# Patient Record
Sex: Female | Born: 1950
Health system: Southern US, Community
[De-identification: ages and names within clinical notes are randomized; demographics above are authoritative.]

## PROBLEM LIST (undated history)

## (undated) DIAGNOSIS — M1990 Unspecified osteoarthritis, unspecified site: Secondary | ICD-10-CM

## (undated) DIAGNOSIS — L659 Nonscarring hair loss, unspecified: Secondary | ICD-10-CM

## (undated) DIAGNOSIS — I341 Nonrheumatic mitral (valve) prolapse: Secondary | ICD-10-CM

## (undated) DIAGNOSIS — R112 Nausea with vomiting, unspecified: Secondary | ICD-10-CM

## (undated) DIAGNOSIS — R569 Unspecified convulsions: Secondary | ICD-10-CM

## (undated) DIAGNOSIS — G7000 Myasthenia gravis without (acute) exacerbation: Secondary | ICD-10-CM

## (undated) DIAGNOSIS — Z9889 Other specified postprocedural states: Secondary | ICD-10-CM

## (undated) DIAGNOSIS — T8859XA Other complications of anesthesia, initial encounter: Secondary | ICD-10-CM

## (undated) DIAGNOSIS — T4145XA Adverse effect of unspecified anesthetic, initial encounter: Secondary | ICD-10-CM

## (undated) DIAGNOSIS — E785 Hyperlipidemia, unspecified: Secondary | ICD-10-CM

## (undated) DIAGNOSIS — K219 Gastro-esophageal reflux disease without esophagitis: Secondary | ICD-10-CM

## (undated) DIAGNOSIS — R609 Edema, unspecified: Secondary | ICD-10-CM

## (undated) DIAGNOSIS — N189 Chronic kidney disease, unspecified: Secondary | ICD-10-CM

## (undated) DIAGNOSIS — R51 Headache: Secondary | ICD-10-CM

## (undated) DIAGNOSIS — D649 Anemia, unspecified: Secondary | ICD-10-CM

## (undated) HISTORY — DX: Myasthenia gravis without (acute) exacerbation: G70.00

## (undated) HISTORY — DX: Gastro-esophageal reflux disease without esophagitis: K21.9

## (undated) HISTORY — PX: OTHER SURGICAL HISTORY: SHX169

## (undated) HISTORY — DX: Hyperlipidemia, unspecified: E78.5

---

## 1984-05-13 HISTORY — PX: TUBAL LIGATION: SHX77

## 2002-11-01 ENCOUNTER — Ambulatory Visit (HOSPITAL_COMMUNITY): Admission: RE | Admit: 2002-11-01 | Discharge: 2002-11-01 | Payer: Self-pay | Admitting: Internal Medicine

## 2002-11-01 ENCOUNTER — Encounter: Payer: Self-pay | Admitting: Internal Medicine

## 2002-11-10 ENCOUNTER — Ambulatory Visit (HOSPITAL_COMMUNITY): Admission: RE | Admit: 2002-11-10 | Discharge: 2002-11-10 | Payer: Self-pay | Admitting: Internal Medicine

## 2002-11-10 ENCOUNTER — Encounter: Payer: Self-pay | Admitting: Internal Medicine

## 2002-11-18 ENCOUNTER — Ambulatory Visit (HOSPITAL_COMMUNITY): Admission: RE | Admit: 2002-11-18 | Discharge: 2002-11-18 | Payer: Self-pay | Admitting: Internal Medicine

## 2002-11-18 ENCOUNTER — Encounter: Payer: Self-pay | Admitting: Internal Medicine

## 2003-06-01 ENCOUNTER — Ambulatory Visit (HOSPITAL_COMMUNITY): Admission: RE | Admit: 2003-06-01 | Discharge: 2003-06-01 | Payer: Self-pay | Admitting: Internal Medicine

## 2003-09-21 ENCOUNTER — Ambulatory Visit (HOSPITAL_COMMUNITY): Admission: RE | Admit: 2003-09-21 | Discharge: 2003-09-21 | Payer: Self-pay | Admitting: Internal Medicine

## 2003-11-16 ENCOUNTER — Ambulatory Visit (HOSPITAL_COMMUNITY): Admission: RE | Admit: 2003-11-16 | Discharge: 2003-11-16 | Payer: Self-pay | Admitting: Internal Medicine

## 2003-11-18 ENCOUNTER — Ambulatory Visit (HOSPITAL_COMMUNITY): Admission: RE | Admit: 2003-11-18 | Discharge: 2003-11-18 | Payer: Self-pay | Admitting: Internal Medicine

## 2003-12-28 HISTORY — PX: OTHER SURGICAL HISTORY: SHX169

## 2004-02-07 ENCOUNTER — Ambulatory Visit (HOSPITAL_COMMUNITY): Admission: RE | Admit: 2004-02-07 | Discharge: 2004-02-07 | Payer: Self-pay | Admitting: General Surgery

## 2004-05-01 ENCOUNTER — Ambulatory Visit (HOSPITAL_COMMUNITY): Admission: RE | Admit: 2004-05-01 | Discharge: 2004-05-01 | Payer: Self-pay | Admitting: Internal Medicine

## 2004-05-13 HISTORY — PX: CERVICAL FUSION: SHX112

## 2004-07-31 ENCOUNTER — Ambulatory Visit (HOSPITAL_COMMUNITY): Admission: RE | Admit: 2004-07-31 | Discharge: 2004-07-31 | Payer: Self-pay | Admitting: Internal Medicine

## 2004-09-18 ENCOUNTER — Ambulatory Visit (HOSPITAL_COMMUNITY): Admission: RE | Admit: 2004-09-18 | Discharge: 2004-09-19 | Payer: Self-pay | Admitting: Neurosurgery

## 2004-12-11 ENCOUNTER — Ambulatory Visit (HOSPITAL_COMMUNITY): Admission: RE | Admit: 2004-12-11 | Discharge: 2004-12-11 | Payer: Self-pay | Admitting: Internal Medicine

## 2005-07-16 ENCOUNTER — Ambulatory Visit (HOSPITAL_COMMUNITY): Admission: RE | Admit: 2005-07-16 | Discharge: 2005-07-16 | Payer: Self-pay | Admitting: Pulmonary Disease

## 2005-09-09 ENCOUNTER — Ambulatory Visit: Payer: Self-pay | Admitting: Internal Medicine

## 2005-09-23 ENCOUNTER — Ambulatory Visit (HOSPITAL_COMMUNITY): Admission: RE | Admit: 2005-09-23 | Discharge: 2005-09-23 | Payer: Self-pay | Admitting: Urology

## 2005-10-17 ENCOUNTER — Ambulatory Visit: Payer: Self-pay | Admitting: Internal Medicine

## 2005-11-29 ENCOUNTER — Ambulatory Visit: Payer: Self-pay | Admitting: Internal Medicine

## 2005-12-12 ENCOUNTER — Ambulatory Visit (HOSPITAL_COMMUNITY): Admission: RE | Admit: 2005-12-12 | Discharge: 2005-12-12 | Payer: Self-pay | Admitting: Internal Medicine

## 2005-12-19 ENCOUNTER — Encounter: Admission: RE | Admit: 2005-12-19 | Discharge: 2005-12-19 | Payer: Self-pay | Admitting: Internal Medicine

## 2006-03-21 ENCOUNTER — Ambulatory Visit: Payer: Self-pay | Admitting: Internal Medicine

## 2006-05-27 ENCOUNTER — Ambulatory Visit: Payer: Self-pay | Admitting: Internal Medicine

## 2006-06-02 ENCOUNTER — Ambulatory Visit (HOSPITAL_COMMUNITY): Admission: RE | Admit: 2006-06-02 | Discharge: 2006-06-02 | Payer: Self-pay | Admitting: Internal Medicine

## 2006-06-03 ENCOUNTER — Ambulatory Visit (HOSPITAL_COMMUNITY): Admission: RE | Admit: 2006-06-03 | Discharge: 2006-06-03 | Payer: Self-pay | Admitting: Internal Medicine

## 2006-07-29 ENCOUNTER — Ambulatory Visit: Payer: Self-pay | Admitting: Internal Medicine

## 2006-12-22 ENCOUNTER — Ambulatory Visit (HOSPITAL_COMMUNITY): Admission: RE | Admit: 2006-12-22 | Discharge: 2006-12-22 | Payer: Self-pay | Admitting: Internal Medicine

## 2006-12-25 ENCOUNTER — Encounter: Admission: RE | Admit: 2006-12-25 | Discharge: 2006-12-25 | Payer: Self-pay | Admitting: Internal Medicine

## 2007-02-23 DIAGNOSIS — R059 Cough, unspecified: Secondary | ICD-10-CM | POA: Insufficient documentation

## 2007-02-23 DIAGNOSIS — J309 Allergic rhinitis, unspecified: Secondary | ICD-10-CM | POA: Insufficient documentation

## 2007-02-23 DIAGNOSIS — G43909 Migraine, unspecified, not intractable, without status migrainosus: Secondary | ICD-10-CM | POA: Insufficient documentation

## 2007-02-23 DIAGNOSIS — R05 Cough: Secondary | ICD-10-CM

## 2007-02-23 DIAGNOSIS — J40 Bronchitis, not specified as acute or chronic: Secondary | ICD-10-CM | POA: Insufficient documentation

## 2007-05-11 ENCOUNTER — Ambulatory Visit: Payer: Self-pay | Admitting: Internal Medicine

## 2007-06-18 ENCOUNTER — Ambulatory Visit: Payer: Self-pay | Admitting: Internal Medicine

## 2008-01-06 ENCOUNTER — Ambulatory Visit (HOSPITAL_COMMUNITY): Admission: RE | Admit: 2008-01-06 | Discharge: 2008-01-06 | Payer: Self-pay | Admitting: Internal Medicine

## 2008-12-01 ENCOUNTER — Ambulatory Visit: Payer: Self-pay | Admitting: Psychology

## 2008-12-13 ENCOUNTER — Ambulatory Visit: Payer: Self-pay | Admitting: Psychology

## 2008-12-23 ENCOUNTER — Ambulatory Visit: Payer: Self-pay | Admitting: Psychology

## 2009-01-04 ENCOUNTER — Ambulatory Visit: Payer: Self-pay | Admitting: Psychology

## 2009-01-11 ENCOUNTER — Ambulatory Visit (HOSPITAL_COMMUNITY): Admission: RE | Admit: 2009-01-11 | Discharge: 2009-01-11 | Payer: Self-pay | Admitting: Internal Medicine

## 2009-02-01 ENCOUNTER — Ambulatory Visit: Payer: Self-pay | Admitting: Psychology

## 2009-02-15 ENCOUNTER — Ambulatory Visit: Payer: Self-pay | Admitting: Psychology

## 2009-03-13 ENCOUNTER — Ambulatory Visit: Payer: Self-pay | Admitting: Psychology

## 2009-04-12 ENCOUNTER — Ambulatory Visit: Payer: Self-pay | Admitting: Psychology

## 2009-05-24 ENCOUNTER — Ambulatory Visit: Payer: Self-pay | Admitting: Psychology

## 2009-06-08 ENCOUNTER — Ambulatory Visit (HOSPITAL_COMMUNITY): Admission: RE | Admit: 2009-06-08 | Discharge: 2009-06-08 | Payer: Self-pay | Admitting: Internal Medicine

## 2010-01-12 ENCOUNTER — Ambulatory Visit (HOSPITAL_COMMUNITY): Admission: RE | Admit: 2010-01-12 | Discharge: 2010-01-12 | Payer: Self-pay | Admitting: Internal Medicine

## 2010-06-02 ENCOUNTER — Encounter: Payer: Self-pay | Admitting: Internal Medicine

## 2010-06-03 ENCOUNTER — Encounter: Payer: Self-pay | Admitting: Pulmonary Disease

## 2010-06-03 ENCOUNTER — Encounter: Payer: Self-pay | Admitting: Internal Medicine

## 2010-06-13 ENCOUNTER — Encounter: Payer: Self-pay | Admitting: Internal Medicine

## 2010-09-18 ENCOUNTER — Other Ambulatory Visit (HOSPITAL_COMMUNITY): Payer: Self-pay | Admitting: Internal Medicine

## 2010-09-18 ENCOUNTER — Ambulatory Visit (HOSPITAL_COMMUNITY)
Admission: RE | Admit: 2010-09-18 | Discharge: 2010-09-18 | Disposition: A | Discharge status: Home / Self Care | Payer: BC Managed Care – PPO | Source: Ambulatory Visit | Attending: Internal Medicine | Admitting: Internal Medicine

## 2010-09-18 DIAGNOSIS — M949 Disorder of cartilage, unspecified: Secondary | ICD-10-CM | POA: Insufficient documentation

## 2010-09-18 DIAGNOSIS — B353 Tinea pedis: Secondary | ICD-10-CM

## 2010-09-18 DIAGNOSIS — M79609 Pain in unspecified limb: Secondary | ICD-10-CM | POA: Insufficient documentation

## 2010-09-18 DIAGNOSIS — M899 Disorder of bone, unspecified: Secondary | ICD-10-CM | POA: Insufficient documentation

## 2010-09-28 NOTE — Assessment & Plan Note (Signed)
[facility]                             PULMONARY OFFICE NOTE   CANYON, WILLOW                      MRN:          [id number]  DATE:05/27/2006                            DOB:          1950/12/20    PULMONARY FOLLOW-UP:   PROBLEMS:  1. Cough.  2. Tracheobronchitis with question of tracheomalacia.  3. Allergic rhinitis/question of asthma.   HISTORY:  In the past week she has had some gradual increasing cough  producing scant clear or slightly creamy mucus.  She has not felt that  she had a cold.  We reviewed her pulmonary function tests from [facility] last March.  She found no benefit from elevating the head of her  bed and points out that she has a remote history of ulcer so she does  pay attention to reflux and indigestion issues, just not noticing much  of any.  She is walking at least a mile a day and noticing no increase  in cough or shortness of breath related to that.  She just started  Lotrel today.  We discussed ACE inhibitor cough.  Phenergan With Codeine  has definitely helped, used just at night.  We reviewed her significant  positive skin test from July and discussed the option of a trial or  allergy vaccine.  She is inclined to accept and live with her cough as  long as there is not a progressive process going on.  We reviewed her  lack of success with Zyrtec and Singulair.   MEDICATIONS:  Omega fish oil, calcium with vitamin D, aspirin,  multivitamin, glucosamine/chondroitin, Advicor 500/20 mg, magnesium, CoQ-  10, Niferex 150 mg, Lotrel 20/10 mg, Imitrex, Excedrin for migraine.  She had come off of Qvar.   Drug-intolerant to Sporanox, which caused rash and joint pain.   OBJECTIVE:  VITAL SIGNS:  Weight 152 pounds, BP 118/62, pulse regular at  72, room air saturation 98%.  CHEST:  I hear no wheezing.  There is occasional dry cough.  Pharynx is  not irritated.  Maybe minimal nasal stuffiness.  CARDIAC:  Heart sounds seem  regular.  EXTREMITIES:  There is no edema.   IMPRESSION:  Cough fits with a nonspecific tracheitis or bronchitis,  possibly with asthma component.  I cannot exclude some combination of  allergic rhinitis and reflux.   PLAN:  1. We are scheduling complete PFTs at [facility] to include methacholine.  2. Okay to use cough syrup occasionally, especially at night.  3. Continue a trial of allergy vaccine.  4. Try Symbicort 160/4.5 mcg two puffs b.i.d. with instructions.  5. Schedule return 2 months, earlier p.r.n.     Clinton D. Maple Hudson, MD, Tonny Bollman, FACP  Electronically Signed    CDY/MedQ  DD: 05/27/2006  DT: 05/28/2006  Job #: [id number]   cc:   Madelin Rear. Sherwood Gambler, MD

## 2010-09-28 NOTE — Assessment & Plan Note (Signed)
[facility]                               PULMONARY OFFICE NOTE   VERNICE, MANNINA                      MRN:          [id number]  DATE:11/29/2005                            DOB:          11-Oct-1950    DATE OF VISIT:  November 29, 2005.   PROBLEM LIST:  1.  Cough.  2.  Tracheobronchitis with question of tracheomalacia.  3.  Allergic rhinitis/question of asthma.   HISTORY:  She returns for allergy testing as planned saying that she is  usually mainly symptomatic in the winter and spring and expects to do best  with her cough in the summer and early fall.  Qvar did seem to help at  first, but as she reached mid summer she decided she no longer needed it and  she is having a little coughing now.  We reviewed her pulmonary function  testing done in March, which had demonstrated only slight reduction in small  airway flows.  After a bronchodilator, comparison was not done.   MEDICATIONS:  1.  Fish oil, calcium, aspirin, and multivitamins.  2.  Avicor 500/20.  3.  Niferex 150 mg.  4.  Norvasc 5 mg.  5.  Imitrex and Excedrin for migraine are used p.r.n.   History of INTOLERANCE TO SPORANOX, which was used in the past for athlete's  foot and caused rash and joint pain.   OBJECTIVE:  VITAL SIGNS:  Weight 145 pounds, BP 122/78, pulse regular at 68,  room air saturation 98%.  GENERAL:  She is comfortable today with clear speech, no obvious nasal  congestion.  LUNGS:  Lung fields are clear without cough.  HEART:  Heart sounds are regular without murmur.   SKIN TEST:  Positive histamine, negative diluent controls.  Significant  positive puncture and especially intradermal reactions primary for grass,  weed, and tree pollens but also to dust and dust mite, and to dog.   IMPRESSION:  1.  Cough with a seasonal pattern, which may include allergic and post viral      bronchitis syndromes and possibly some cyclical cough with episodic      reflux.  All  of this has again been reviewed with her.  2.  Allergic component with significant positive skin testing suggesting      that allergic rhinitis and postnasal drainage or a seasonal asthma may      also contribute to her cough complaints.  Currently she is doing well.   PLAN:  1.  Dust and environmental precautions were reviewed with information      provided.  2.  Continue Qvar 80 suggesting once a day now and b.i.d. in problem      seasons.  3.  Consider allergy vaccine and consider pulmonary function testing before      and after bronchodilator, Methacholine inhalation challenge, as options      for later.  4.  Schedule a return in four months, earlier p.r.n., to try to catch her at      the beginning of her problem season.  Clinton D. Maple Hudson, MD, FCCP, FACP   CDY/MedQ  DD:  11/30/2005  DT:  11/30/2005  Job #:  [id number]   cc:   Madelin Rear. Sherwood Gambler, MD

## 2010-09-28 NOTE — Assessment & Plan Note (Signed)
[facility]                               PULMONARY OFFICE NOTE   NAME:TEMPLETONBlondie, Riggsbee                      MRN:          [id number]  DATE:03/21/2006                            DOB:          1951/03/21    PULMONARY FOLLOWUP   PROBLEMS:  1. Cough.  2. Tracheobronchitis with a question of tracheomalacia.  3. Allergic rhinitis/question of asthma.   HISTORY:  A 34-month followup.  She says cough is not bad now.  She usually  expects it to be a little worse early in the morning.  She is not using any  medications, including Qvar, because she did not feel she needed it.  I  talked with her some about maintenance, anti-inflammatory, and rescue  medications.  She had had positive allergy skin testing in July.  We had  discussed environmental precautions then.   MEDICATIONS:  1. Omega fish oil.  2. Calcium with vitamin D.  3. Aspirin.  4. Multivitamins.  5. Glucosamine.  6. Advicor 500/20.  7. Magnesium.  8. Co-Q10.  9. Niferex 150 mg.  10.Norvasc 7.5 mg.  11.She had had Qvar 80 but is not using it.  12.P.r.n. use of Imitrex and Excedrin for migraine.   DRUG INTOLERANCE:  SPORANOX, which had treated athlete's foot, but was  associated with rash and joint pain.   OBJECTIVE:  Weight 149 pounds, BP 120/78, pulse regular 70, room air  saturation 97%.  Minor, almost incidental dry cough noted during this visit.  Nose and throat clear.  Voice quality normal.  No stridor.  LUNGS:  Clear.  HEART:  Sounds regular and normal.   IMPRESSION:  Cough is probably multifactorial with allergic rhinitis,  asthmatic bronchitis, and reflux components suspected.  I suspect relative  importance of causes shifts with the seasons and is almost inactive now.   PLAN:  1. We discussed reflux precautions and she is willing to try elevating      head of bed with brick.  2. Consider full PFTs including methacholine inhalation challenge test.  3. Enforcement of  environmental and dust precautions with option to      increase allergy medications or try allergy vaccine if she is having      enough trouble.  Right now she does not feel she is needing anything.  4. She did accept a prescription for some p.r.n. Phenergan with codeine to      keep at the house 200 ml 5 ml q.6 h p.r.n. for very occasional use      only.  5. Schedule return in 2 months, earlier p.r.n.     Clinton D. Maple Hudson, MD, Tonny Bollman, FACP  Electronically Signed    CDY/MedQ  DD: 03/23/2006  DT: 03/23/2006  Job #: [id number]   cc:   Madelin Rear. Sherwood Gambler, MD

## 2010-09-28 NOTE — Op Note (Signed)
Mary [name], Mary [name]               ACCOUNT NO.:  [id number]   MEDICAL RECORD NO.:  [id number]          PATIENT TYPE:  OIB   LOCATION:  2899                         FACILITY:  [facility]   PHYSICIAN:  Clydene Fake, M.D.  DATE OF BIRTH:  1951/02/15   DATE OF PROCEDURE:  09/18/2004  DATE OF DISCHARGE:                                 OPERATIVE REPORT   PREOPERATIVE DIAGNOSES:  1.  Herniated nucleus pulposus with spondylosis.  2.  Left-sided radiculopathy at C5-6.   POSTOPERATIVE DIAGNOSES:  1.  Herniated nucleus pulposus with spondylosis.  2.  Left-sided radiculopathy at C5-6.   PROCEDURE:  Anterior cervical decompression, diskectomy and fusion at C5-6  with __________ allograft bone at the anterior cervical plate.   SURGEON:  Clydene Fake, M.D.   ASSISTANT:  __________   ANESTHESIA:  General endotracheal tube anesthesia.   ESTIMATED BLOOD LOSS:  Minimal.   BLOOD REPLACED:  None.   DRAINS:  None.   COMPLICATIONS:  None.   INDICATIONS FOR PROCEDURE:  This is a 61 year old woman who for years has  had intermittent numbness down the left arm, but over the last two months or  so she has had some burning sensation running down the arm with numbness  into the thumb and several fingers.  There has been increasing pain.  A new  MRI was done showing chronic changes but mild at multiple levels and at C5-6  large spondylitic disk protrusion and osteophytes compressing the left C6  root.  The patient is brought in for a decompression and fusion.   DESCRIPTION OF PROCEDURE:  The patient is brought to the operating room.  General anesthesia is induced.  The patient was placed in 10 pounds of  Holter traction and prepped and draped in a sterile fashion.  The site of  incision was injected with 10 mL of 1% lidocaine with epinephrine.  An  incision was then made from the midline to the anterior border of the  sternocleidomastoid muscle on the left side.  The neck incision was taken to  the platysma and hemostasis obtained with Bovie cauterization.  The platysma  was incised with the Bovie and blunt dissection was taken through the  anterior cervical fascia of the anterior cervical spine.  A needle was  placed in the interspace and x-rays were obtained showing that this is the  C5-6 interspace.  As the needle was removed, the disk space was incised with  the #15 blade and a partial diskectomy was performed with pituitary  rongeurs.  The longus coli muscle was reflected laterally on each side.  A  self-retaining retractor was placed.  Distraction pins were placed in C5 and  C6 to __________ the interspace of distraction.  The diskectomy was then  continued removing osteophytes with Kerrison punches, removing the disk with  curets and pituitary rongeurs, then removing posterior ligament, posterior  disk and osteophytes with 1 mm and then 2 mm Kerrison punches.  Bilateral  foraminotomies were performed.  There was a large spondylitic protrusion to  the left side, and this was removed and there continues  to good central and  lateral decompression.  I measured the interbody space to be 5 mm, and we  drilled the cartilaginous end plates and then tapped a 5 mm __________  allograft bone into place, and counter-sunk about 1 mm.  We checked  posterior to the graft and there was plenty of room between the graft and  the dura.  Distraction was removed and distraction pins were removed.  Hemostasis was obtained with Gelfoam and thrombin.  Weight was removed from  the traction device.  The __________ anterior cervical plate was placed over  the anterior cervical spine.  Two screws were placed in C5 and two in C6.  These were tightened down.  The lateral x-ray obtained showing good position  of the plate, screws, interbody bone plug at the C5-6 level.  We irrigated  with antibiotic solution, and the retractors were removed with good  hemostasis.  The platysma was closed with #3-0 Vicryl  interrupted sutures.  The subcutaneous tissue was closed with same.  The skin was closed with  Benzoin and Steri-Strips.  The dressing was placed.   The patient was placed back into a soft cervical collar and awakened from  anesthesia and transferred to the recovery room in stable condition.      JRH/MEDQ  D:  09/18/2004  T:  09/18/2004  Job:  [id number]

## 2010-09-28 NOTE — Procedures (Signed)
NAME:  SCOTT, FIX               ACCOUNT NO.:  [id number]   MEDICAL RECORD NO.:  [id number]          PATIENT TYPE:  OUT   LOCATION:  RESP                          FACILITY:  [facility]   PHYSICIAN:  Edward L. Juanetta Gosling, M.D.DATE OF BIRTH:  Jul 14, 1950   DATE OF PROCEDURE:  07/16/2005  DATE OF DISCHARGE:                              PULMONARY FUNCTION TEST   RESULTS:  1.  Spirometry is normal with the exception of a very slight reduction in      FEF 25/75 which may indicate airflow obstruction in the smaller airways.  2.  Lung volumes are normal.  3.  Diffusion capacity of carbon monoxide (DLCO) is normal.      Edward L. Juanetta Gosling, M.D.  Electronically Signed     ELH/MEDQ  D:  07/18/2005  T:  07/18/2005  Job:  [id number]   cc:   [facility]

## 2010-09-28 NOTE — Assessment & Plan Note (Signed)
 [facility]                             PULMONARY OFFICE NOTE   NAME:TEMPLETONRenn, Stille                      MRN:          [id number]  DATE:07/29/2006                            DOB:          08/01/1950    PROBLEM:  1. Cough.  2. Tracheobronchitis with question of tracheomalacia.  3. Allergic rhinitis/question of asthma.  4. Migraine.   HISTORY:  She has been coughing less.  She thinks Symbicort helps, she  is not as bad as last year.  She routinely expects to get better between  May and September and asks permission to drop off Symbicort during that  period, if not needed.  Occasionally aware of some reflux, but not bad,  and it does not wake her.  Cough syrup rarely used.  No feeling of  tightness or wheeze.  She has been on Lotrel for migraine.  This was  begun long after the cough became an issue, but we did discuss ACE  inhibitors as possibly sustaining a cough that would have otherwise  cleared.   MEDICATION:  Her list was reviewed, as charted, with no changes.   OBJECTIVE:  Weight 152 pounds, BP 112/72, pulse 79, room air saturation  96%.  A couple of scattered crackles.  Minimal dry cough noted only  once.  Her pharynx was clear with no evidence of postnasal drainage.  I  heard no wheeze or coarse sounds at all.  There was no edema or  adenopathy.   Pulmonary function testing, January 21 at [facility], had been within normal  limits, including spirometry flows with no response to bronchodilator,  measured lung volumes and diffusion capacity.  Methacholine inhalation  challenge test was normal.  Negative for hyper-reactive airways.  She  had been using Symbicort in the few days before that testing was done.   IMPRESSION:  Cough, consistent with a mild bronchitis of nonspecific  etiology, possibly mixed.  Have to question the significance of Lotrel,  even if she states cough began before she started Lotrel.  We have again  reviewed environmental  precautions because of her positive skin testing,  avoidance of irritants and reflux precautions.   PLAN:  1. Okay to drop off Symbicort for the summer, if she does not feel she      needs it.  2. She will ask her migraine doctor about an alternative to Lotrel,      possibly something with an ARB.  3. We are scheduling return late December, which is about the time her      cough usually starts      up.  That pattern would suggest a viral trigger.  4. Consider chest x-ray on return.     Clinton D. Maple Hudson, MD, Tonny Bollman, FACP  Electronically Signed    CDY/MedQ  DD: 07/29/2006  DT: 07/30/2006  Job #: [id number]   cc:   Madelin Rear. Sherwood Gambler, MD

## 2010-09-28 NOTE — Op Note (Signed)
NAME:  Mary [name], Mary [name]               ACCOUNT NO.:  [id number]   MEDICAL RECORD NO.:  [id number]          PATIENT TYPE:  AMB   LOCATION:  DAY                           FACILITY:  [facility]   PHYSICIAN:  Dalia Heading, M.D.  DATE OF BIRTH:  1950-12-06   DATE OF PROCEDURE:  02/07/2004  DATE OF DISCHARGE:                                 OPERATIVE REPORT   PREOPERATIVE DIAGNOSIS:  Need for screening colonoscopy.   POSTOPERATIVE DIAGNOSIS:  Need for screening colonoscopy.   PROCEDURE:  Colonoscopy.   SURGEON:  Dalia Heading, M.D.   ANESTHESIA:  Demerol 50 mg IV, Versed 4 mg IV.   INDICATIONS:  The patient is a 60 year old white female who presents for a  screening colonoscopy.  She has no family history of colon carcinoma.  The  risks and benefits of the procedure, including bleeding and perforation,  were fully explained to the patient, who gave informed consent.   PROCEDURE NOTE:  The patient was placed in the left lateral decubitus  position after placement of a nasal cannula, pulse oximetry, and noninvasive  blood pressure monitoring.  Demerol and Versed were used throughout the  procedure for anesthesia.  A rectal examination was performed, which was  negative for any mass lesions.  The colonoscope was advanced to the cecum  with moderate difficulty due to tortuosity of the colon.  The cecum was  fully visualized and noted to be within normal limits.  The bowel  preparation was adequate.  The ascending colon, transverse colon, descending  colon, sigmoid colon, and rectum were all within normal limits.  No abnormal  lesions were noted.  All air was evacuated from the colon and rectum prior  to removal of the colonoscope.   The patient tolerated the procedure well and was transferred back to day  surgery in stable condition.   COMPLICATIONS:  None.   SPECIMENS:  None.   ESTIMATED BLOOD LOSS:  None.   RECOMMENDATIONS:  A follow-up colonoscopy in 10 years is recommended for  screening purposes.      MAJ/MEDQ  D:  02/07/2004  T:  02/07/2004  Job:  [id number]   cc:   Madelin Rear. Sherwood Gambler, M.D.  [address]  [place]  [place] [address]  Fax: [phone]

## 2011-01-04 ENCOUNTER — Other Ambulatory Visit (HOSPITAL_COMMUNITY): Payer: Self-pay | Admitting: Internal Medicine

## 2011-01-04 DIAGNOSIS — Z139 Encounter for screening, unspecified: Secondary | ICD-10-CM

## 2011-01-15 ENCOUNTER — Ambulatory Visit (HOSPITAL_COMMUNITY)
Admission: RE | Admit: 2011-01-15 | Discharge: 2011-01-15 | Disposition: A | Discharge status: Home / Self Care | Payer: BC Managed Care – PPO | Source: Ambulatory Visit | Attending: Internal Medicine | Admitting: Internal Medicine

## 2011-01-15 DIAGNOSIS — Z1231 Encounter for screening mammogram for malignant neoplasm of breast: Secondary | ICD-10-CM | POA: Insufficient documentation

## 2011-01-15 DIAGNOSIS — Z139 Encounter for screening, unspecified: Secondary | ICD-10-CM

## 2011-06-13 ENCOUNTER — Other Ambulatory Visit (HOSPITAL_COMMUNITY): Payer: Self-pay | Admitting: Cardiovascular Disease

## 2011-06-13 ENCOUNTER — Ambulatory Visit (HOSPITAL_COMMUNITY)
Admission: RE | Admit: 2011-06-13 | Discharge: 2011-06-13 | Disposition: A | Discharge status: Home / Self Care | Payer: BC Managed Care – PPO | Source: Ambulatory Visit | Attending: Cardiovascular Disease | Admitting: Cardiovascular Disease

## 2011-06-13 DIAGNOSIS — R05 Cough: Secondary | ICD-10-CM

## 2011-06-13 DIAGNOSIS — R059 Cough, unspecified: Secondary | ICD-10-CM | POA: Insufficient documentation

## 2011-07-02 HISTORY — PX: TRANSTHORACIC ECHOCARDIOGRAM: SHX275

## 2011-07-02 HISTORY — PX: OTHER SURGICAL HISTORY: SHX169

## 2011-07-05 ENCOUNTER — Other Ambulatory Visit (HOSPITAL_COMMUNITY): Payer: Self-pay | Admitting: Internal Medicine

## 2011-07-05 DIAGNOSIS — M238X9 Other internal derangements of unspecified knee: Secondary | ICD-10-CM

## 2011-07-11 ENCOUNTER — Ambulatory Visit (HOSPITAL_COMMUNITY)
Admission: RE | Admit: 2011-07-11 | Discharge: 2011-07-11 | Disposition: A | Discharge status: Home / Self Care | Payer: BC Managed Care – PPO | Source: Ambulatory Visit | Attending: Internal Medicine | Admitting: Internal Medicine

## 2011-07-11 ENCOUNTER — Other Ambulatory Visit (HOSPITAL_COMMUNITY): Payer: Self-pay | Admitting: Internal Medicine

## 2011-07-11 DIAGNOSIS — M238X9 Other internal derangements of unspecified knee: Secondary | ICD-10-CM | POA: Insufficient documentation

## 2011-07-11 DIAGNOSIS — M23359 Other meniscus derangements, posterior horn of lateral meniscus, unspecified knee: Secondary | ICD-10-CM | POA: Insufficient documentation

## 2011-07-11 DIAGNOSIS — M25569 Pain in unspecified knee: Secondary | ICD-10-CM | POA: Insufficient documentation

## 2011-12-11 ENCOUNTER — Other Ambulatory Visit (HOSPITAL_COMMUNITY): Payer: Self-pay | Admitting: Internal Medicine

## 2011-12-11 DIAGNOSIS — Z139 Encounter for screening, unspecified: Secondary | ICD-10-CM

## 2012-01-16 ENCOUNTER — Ambulatory Visit (HOSPITAL_COMMUNITY)
Admission: RE | Admit: 2012-01-16 | Discharge: 2012-01-16 | Disposition: A | Discharge status: Home / Self Care | Payer: BC Managed Care – PPO | Source: Ambulatory Visit | Attending: Internal Medicine | Admitting: Internal Medicine

## 2012-01-16 DIAGNOSIS — Z1231 Encounter for screening mammogram for malignant neoplasm of breast: Secondary | ICD-10-CM | POA: Insufficient documentation

## 2012-01-16 DIAGNOSIS — Z139 Encounter for screening, unspecified: Secondary | ICD-10-CM

## 2012-12-24 ENCOUNTER — Other Ambulatory Visit (HOSPITAL_COMMUNITY): Payer: Self-pay | Admitting: Internal Medicine

## 2012-12-24 DIAGNOSIS — Z139 Encounter for screening, unspecified: Secondary | ICD-10-CM

## 2013-01-19 ENCOUNTER — Ambulatory Visit (HOSPITAL_COMMUNITY)
Admission: RE | Admit: 2013-01-19 | Discharge: 2013-01-19 | Disposition: A | Discharge status: Home / Self Care | Payer: BC Managed Care – PPO | Source: Ambulatory Visit | Attending: Internal Medicine | Admitting: Internal Medicine

## 2013-01-19 DIAGNOSIS — Z1231 Encounter for screening mammogram for malignant neoplasm of breast: Secondary | ICD-10-CM | POA: Insufficient documentation

## 2013-01-19 DIAGNOSIS — Z139 Encounter for screening, unspecified: Secondary | ICD-10-CM

## 2013-02-22 ENCOUNTER — Telehealth: Payer: Self-pay | Admitting: Cardiovascular Disease

## 2013-02-22 ENCOUNTER — Other Ambulatory Visit: Payer: Self-pay | Admitting: *Deleted

## 2013-02-22 MED ORDER — BENAZEPRIL HCL 10 MG PO TABS: 10.0000 mg | ORAL_TABLET | Freq: Every day | ORAL | Status: DC

## 2013-02-22 MED ORDER — AMLODIPINE BESYLATE 10 MG PO TABS: 10.0000 mg | ORAL_TABLET | Freq: Every day | ORAL | Status: DC

## 2013-02-22 NOTE — Telephone Encounter (Signed)
Patient states that she spoke with [place] a few minutes ago about the refill for her Crestor.  She takes Crestor 40mg .

## 2013-02-22 NOTE — Telephone Encounter (Signed)
Rx was sent to pharmacy electronically. 

## 2013-02-23 MED ORDER — ROSUVASTATIN CALCIUM 40 MG PO TABS: 40.0000 mg | ORAL_TABLET | Freq: Every day | ORAL | Status: DC

## 2013-02-23 NOTE — Telephone Encounter (Signed)
Rx was sent to pharmacy electronically. 

## 2013-04-12 ENCOUNTER — Telehealth: Payer: Self-pay | Admitting: Cardiovascular Disease

## 2013-04-12 DIAGNOSIS — E785 Hyperlipidemia, unspecified: Secondary | ICD-10-CM

## 2013-04-12 DIAGNOSIS — R5383 Other fatigue: Secondary | ICD-10-CM

## 2013-04-12 DIAGNOSIS — Z79899 Other long term (current) drug therapy: Secondary | ICD-10-CM

## 2013-04-12 NOTE — Telephone Encounter (Signed)
Please send order to [facility] lab in [place] for lab work before her appointment on 05/20/13 @2pm ...    Thanks

## 2013-04-12 NOTE — Telephone Encounter (Signed)
Order placed and in Epic

## 2013-04-12 NOTE — Telephone Encounter (Signed)
I spoke with Dr Tresa Endo and we can order a regular lipid profile instead of an NMR

## 2013-05-17 LAB — COMPLETE METABOLIC PANEL WITH GFR
ALT: 17 U/L (ref 0–35)
AST: 19 U/L (ref 0–37)
Albumin: 4 g/dL (ref 3.5–5.2)
Alkaline Phosphatase: 63 U/L (ref 39–117)
BUN: 19 mg/dL (ref 6–23)
CO2: 29 mEq/L (ref 19–32)
Calcium: 9.3 mg/dL (ref 8.4–10.5)
Chloride: 104 mEq/L (ref 96–112)
Creat: 0.97 mg/dL (ref 0.50–1.10)
GFR, Est African American: 71 mL/min
GFR, Est Non African American: 62 mL/min
Glucose, Bld: 95 mg/dL (ref 70–99)
Potassium: 4.3 mEq/L (ref 3.5–5.3)
Sodium: 140 mEq/L (ref 135–145)
Total Bilirubin: 0.4 mg/dL (ref 0.3–1.2)
Total Protein: 6.9 g/dL (ref 6.0–8.3)

## 2013-05-17 LAB — LIPID PANEL
Cholesterol: 172 mg/dL (ref 0–200)
HDL: 57 mg/dL (ref >39)
LDL Cholesterol: 101 mg/dL — ABNORMAL HIGH (ref 0–99)
Total CHOL/HDL Ratio: 3 Ratio
Triglycerides: 68 mg/dL (ref <150)
VLDL: 14 mg/dL (ref 0–40)

## 2013-05-17 LAB — CBC
HCT: 38.2 % (ref 36.0–46.0)
Hemoglobin: 13 g/dL (ref 12.0–15.0)
MCH: 29.3 pg (ref 26.0–34.0)
MCHC: 34 g/dL (ref 30.0–36.0)
MCV: 86 fL (ref 78.0–100.0)
Platelets: 244 10*3/uL (ref 150–400)
RBC: 4.44 MIL/uL (ref 3.87–5.11)
RDW: 13.8 % (ref 11.5–15.5)
WBC: 6.1 10*3/uL (ref 4.0–10.5)

## 2013-05-17 LAB — TSH: TSH: 2.545 u[IU]/mL (ref 0.350–4.500)

## 2013-05-20 ENCOUNTER — Ambulatory Visit (INDEPENDENT_AMBULATORY_CARE_PROVIDER_SITE_OTHER): Payer: BC Managed Care – PPO | Admitting: Cardiovascular Disease

## 2013-05-20 ENCOUNTER — Encounter: Payer: Self-pay | Admitting: Cardiovascular Disease

## 2013-05-20 VITALS — BP 110/70 | HR 75 | Ht 65.0 in | Wt 149.4 lb

## 2013-05-20 DIAGNOSIS — I10 Essential (primary) hypertension: Secondary | ICD-10-CM | POA: Insufficient documentation

## 2013-05-20 DIAGNOSIS — E785 Hyperlipidemia, unspecified: Secondary | ICD-10-CM | POA: Insufficient documentation

## 2013-05-20 DIAGNOSIS — G43909 Migraine, unspecified, not intractable, without status migrainosus: Secondary | ICD-10-CM

## 2013-05-20 NOTE — Patient Instructions (Signed)
Your physician recommends that you schedule a follow-up appointment in: 1 year. No changes were made today in your therapy. 

## 2013-05-20 NOTE — Progress Notes (Signed)
Patient ID: Mary [name], female   DOB: 09/21/1950, 63 y.o.   MRN: [id number]     HPI: Mary [name] is a 63 y.o. female presents to the office for six-month cardiology evaluation.  Mary [name] has a long-standing history of migraine headaches, history of hypertension, hyperlipidemia, GERD, and has strong family history for both coronary as well as peripheral vascular disease. She also saw extremity venous abnormalities. Past she had been on pravastatin for hyperlipidemia but this was switched to Crestor do to inadequate response to pravastatin. I had last seen her in May 2014.  Over the past 6 months, she has felt well on her current medical regimen which consists of amlodipine 10 mg benazepril 10 mg for hypetension and Crestor 40 mg for hyperlipidemia. Her current medical regimen she is really had to take her Imitrex for migraine headaches but takes these typically one to 2 times per year.  3 days ago, she underwent followup laboratory. This revealed this potassium of 4.3. She had normal renal function and normal liver function studies. Hemoglobin was 13 hematocrit 38.2. Normal platelets. TSH was normal at 2.545. Lipid studies revealed total close to 172 triglycerides 68 HDL 57 VLDL 14 LDL cholesterol 101 on her current regimen.  She denies chest pain. She denies shortness of breath. She is unaware of palpitations. She denies significant edema  Past Medical History  Diagnosis Date  . Hypertension   . Hyperlipidemia   . GERD (gastroesophageal reflux disease)     History reviewed. No pertinent past surgical history.  Allergies  Allergen Reactions  . Sporanox [Itraconazole] Swelling    Bilateral knee swelling and pain.    Current Outpatient Prescriptions  Medication Sig Dispense Refill  . acetaminophen (TYLENOL) 500 MG tablet Take 500 mg by mouth as needed.      Marland Kitchen amLODipine (NORVASC) 10 MG tablet Take 1 tablet (10 mg total) by mouth daily.  30 tablet  9  . B Complex-C-Folic  Acid (HM VITAMIN B COMPLEX/VITAMIN C PO) Take 1 tablet by mouth daily.      . benazepril (LOTENSIN) 10 MG tablet Take 1 tablet (10 mg total) by mouth daily.  30 tablet  9  . Calcium-Magnesium-Vitamin D (CALCIUM 500 PO) Take 1 capsule by mouth 4 (four) times daily.      . cholecalciferol (VITAMIN D) 400 UNITS TABS tablet Take 400 Units by mouth daily.      . ferrous fumarate (HEMOCYTE - 106 MG FE) 325 (106 FE) MG TABS tablet Take 1 tablet by mouth.      . fexofenadine (ALLEGRA) 180 MG tablet Take 180 mg by mouth daily.      . Glucosamine-Chondroitin 750-600 MG TABS Take 1 tablet by mouth daily.      Marland Kitchen GRAPE SEED EXTRACT PO Take 1 capsule by mouth daily.      . Magnesium 250 MG TABS Take 1 tablet by mouth daily.      . Multiple Vitamin (MULTIVITAMIN) capsule Take 1 capsule by mouth daily.      . Omega-3 Fatty Acids (FISH OIL) 1200 MG CAPS Take 1 capsule by mouth 2 (two) times daily.      Marland Kitchen pyridOXINE (VITAMIN B-6) 100 MG tablet Take 100 mg by mouth daily.      . rosuvastatin (CRESTOR) 40 MG tablet Take 1 tablet (40 mg total) by mouth daily.  30 tablet  3  . SUMAtriptan (IMITREX) 100 MG tablet Take 100 mg by mouth as needed for migraine or headache. May  repeat in 2 hours if headache persists or recurs.       No current facility-administered medications for this visit.    History   Social History  . Marital Status: Married    Spouse Name: N/A    Number of Children: N/A  . Years of Education: N/A   Occupational History  . Not on file.   Social History Main Topics  . Smoking status: Former Smoker    Types: Cigarettes  . Smokeless tobacco: Never Used     Comment: quit 24 years ago.  . Alcohol Use: Yes     Comment: wine- rarely.  . Drug Use: Not on file  . Sexual Activity: Not on file   Other Topics Concern  . Not on file   Social History Narrative  . No narrative on file   Social history is notable that she is married and has remained active. She does water aerobics and also  rides a bicycle. She does not smoke, but there is a remote history of tobacco use having quit approximately 24 years ago. She has 2 children. She is the sister-in-law of Dr. Adella Hare and is married to Dr. Daylene Posey wife's brother.  Family History  Problem Relation Age of Onset  . Dementia Mother   . Heart attack Father   . Heart disease Father   . Kidney failure Father   . Heart attack Brother     age 58  . Cancer - Other Brother     kidney    ROS is negative for fevers, chills or night sweats.  She denies recent migraine headaches. She does wear glasses. She denies change in vision or hearing. She denies lymphadenopathy. She denies wheezing. There is no shortness of breath. She is unaware of palpitations. She denies nausea vomiting or diarrhea. There is no abdominal pain. There is no blood in stool or urine. She denies claudication. There is no significant edema. She denies neurologic symptoms. She denies sleep disordered breathing. Other comprehensive 14 point system review is negative.  PE BP 110/70  Pulse 75  Ht 5\' 5"  (1.651 m)  Wt 149 lb 6.4 oz (67.767 kg)  BMI 24.86 kg/m2  General: Alert, oriented, no distress.  Skin: normal turgor, no rashes HEENT: Normocephalic, atraumatic. Pupils round and reactive; sclera anicteric;no lid lag. Extraocular muscles intact. Nose without nasal septal hypertrophy Mouth/Parynx benign; Mallinpatti scale 2 Neck: No JVD, no carotid bruits; normal carotid upstroke Lungs: clear to ausculatation and percussion; no wheezing or rales Chest wall: no tenderness to palpitation Heart: RRR, s1 s2 normal 1/6 systolic murmur Abdomen: soft, nontender; no hepatosplenomehaly, BS+; abdominal aorta nontender and not dilated by palpation. Back: no CVA tenderness Pulses 2+ Extremities: no clubbing cyanosis or edema, Homan's sign negative  Neurologic: grossly nonfocal; cranial nerves grossly normal. Psychologic: normal affect and mood.  ECG (independently read  by me): Normal sinus rhythm with nonspecific ST-T changes. Heart rate 75 beats per minute.  LABS:  BMET    Component Value Date/Time   NA 140 05/17/2013 0804   K 4.3 05/17/2013 0804   CL 104 05/17/2013 0804   CO2 29 05/17/2013 0804   GLUCOSE 95 05/17/2013 0804   BUN 19 05/17/2013 0804   CREATININE 0.97 05/17/2013 0804   CALCIUM 9.3 05/17/2013 0804     Hepatic Function Panel     Component Value Date/Time   PROT 6.9 05/17/2013 0804   ALBUMIN 4.0 05/17/2013 0804   AST 19 05/17/2013 0804   ALT 17 05/17/2013  0804   ALKPHOS 63 05/17/2013 0804   BILITOT 0.4 05/17/2013 0804     CBC    Component Value Date/Time   WBC 6.1 05/17/2013 0804   RBC 4.44 05/17/2013 0804   HGB 13.0 05/17/2013 0804   HCT 38.2 05/17/2013 0804   PLT 244 05/17/2013 0804   MCV 86.0 05/17/2013 0804   MCH 29.3 05/17/2013 0804   MCHC 34.0 05/17/2013 0804   RDW 13.8 05/17/2013 0804     BNP No results found for this basename: probnp    Lipid Panel     Component Value Date/Time   CHOL 172 05/17/2013 0804   TRIG 68 05/17/2013 0804   HDL 57 05/17/2013 0804   CHOLHDL 3.0 05/17/2013 0804   VLDL 14 05/17/2013 0804   LDLCALC 101* 05/17/2013 0804     RADIOLOGY: No results found.    ASSESSMENT AND PLAN:  Ms. Castleman is 63 year old female has a long-standing history of hypertension as well as hyperlipidemia. Presently, her blood pressure is well controlled on combination therapy with amlodipine 10 mg benazepril 10 mg. Her lipid studies are very good on her current dose of Crestor 40 mg. She's not having any further migraine headaches. I did review her laboratory. Her ECG is unremarkable but does show non-specific ST-T changes.  As long as she remains stable, I will see her back in one year for cardiology reevaluation.    Troy Sine, MD, [facility]  05/20/2013 2:52 PM

## 2013-05-25 ENCOUNTER — Encounter: Payer: Self-pay | Admitting: Cardiovascular Disease

## 2013-07-20 ENCOUNTER — Other Ambulatory Visit: Payer: Self-pay

## 2013-07-20 MED ORDER — ROSUVASTATIN CALCIUM 40 MG PO TABS: 40.0000 mg | ORAL_TABLET | Freq: Every day | ORAL | Status: DC

## 2013-07-20 NOTE — Telephone Encounter (Signed)
Rx was sent to pharmacy electronically. 

## 2013-08-05 ENCOUNTER — Telehealth: Payer: Self-pay | Admitting: *Deleted

## 2013-08-05 NOTE — Telephone Encounter (Signed)
Faxed surgical clearance to have knee surgery scheduled for 08/31/13.

## 2013-08-17 ENCOUNTER — Ambulatory Visit (INDEPENDENT_AMBULATORY_CARE_PROVIDER_SITE_OTHER): Payer: BC Managed Care – PPO | Admitting: Cardiology

## 2013-08-17 ENCOUNTER — Encounter: Payer: Self-pay | Admitting: Cardiology

## 2013-08-17 VITALS — BP 108/72 | HR 61 | Ht 65.0 in | Wt 152.3 lb

## 2013-08-17 DIAGNOSIS — Z01818 Encounter for other preprocedural examination: Secondary | ICD-10-CM | POA: Insufficient documentation

## 2013-08-17 DIAGNOSIS — I10 Essential (primary) hypertension: Secondary | ICD-10-CM

## 2013-08-17 NOTE — Assessment & Plan Note (Signed)
Discussed with Dr. Claiborne Billings last echo in stress tests were in 2013 were normal. She has had no chest pain since that time EKG was without any changes. Dr. Claiborne Billings and I have cleared her to undergo surgery for total knee replacement.

## 2013-08-17 NOTE — Patient Instructions (Signed)
I will discuss with Dr. Claiborne Billings- He may want to do stress test, I will call this PM and let you know.

## 2013-08-17 NOTE — Progress Notes (Signed)
08/17/2013   PCP: Glo Herring., MD   Chief Complaint  Patient presents with  . Cardiac Clearance    Total Knee Replacement scheduled for 08/31/2013 by Dr Roma Schanz, c/o edema.    Primary Cardiologist: Dr. Claiborne Billings  HPI:  Mary [name] has a long-standing history of migraine headaches, history of hypertension, hyperlipidemia, GERD, and has strong family history for both coronary as well as peripheral vascular disease. She also saw extremity venous abnormalities. Past she had been on pravastatin for hyperlipidemia but this was switched to Crestor do to inadequate response to pravastatin.  Lipid studies revealed total close to 172 triglycerides 68 HDL 57 VLDL 14 LDL cholesterol 101 on her current regimen.  Today she presents for cardiology clearance for total knee surgery. She plans right total knee in April 21 of this year left total knee in the future.  She denies chest pain shortness of breath or lightheadedness. She has occasional premature beat described as a quick get that last seconds at most a minute.  No changes in her medical condition since she saw Dr. Claiborne Billings in January of this year.    Allergies  Allergen Reactions  . Sporanox [Itraconazole] Swelling    Bilateral knee swelling and pain.    Current Outpatient Prescriptions  Medication Sig Dispense Refill  . acetaminophen (TYLENOL) 500 MG tablet Take 500 mg by mouth as needed.      Marland Kitchen amLODipine (NORVASC) 10 MG tablet Take 1 tablet (10 mg total) by mouth daily.  30 tablet  9  . B Complex-C-Folic Acid (HM VITAMIN B COMPLEX/VITAMIN C PO) Take 1 tablet by mouth daily.      . benazepril (LOTENSIN) 10 MG tablet Take 1 tablet (10 mg total) by mouth daily.  30 tablet  9  . Calcium-Magnesium-Vitamin D (CALCIUM 500 PO) Take 1 capsule by mouth 4 (four) times daily.      . cholecalciferol (VITAMIN D) 400 UNITS TABS tablet Take 400 Units by mouth daily.      . ferrous fumarate (HEMOCYTE - 106 MG FE) 325 (106 FE) MG TABS  tablet Take 1 tablet by mouth.      . fexofenadine (ALLEGRA) 180 MG tablet Take 180 mg by mouth daily.      . Glucosamine-Chondroitin 750-600 MG TABS Take 1 tablet by mouth daily.      Marland Kitchen GRAPE SEED EXTRACT PO Take 1 capsule by mouth daily.      . Magnesium 250 MG TABS Take 1 tablet by mouth daily.      . Multiple Vitamin (MULTIVITAMIN) capsule Take 1 capsule by mouth daily.      . Omega-3 Fatty Acids (FISH OIL) 1200 MG CAPS Take 1 capsule by mouth 2 (two) times daily.      Marland Kitchen pyridOXINE (VITAMIN B-6) 100 MG tablet Take 100 mg by mouth daily.      . rosuvastatin (CRESTOR) 40 MG tablet Take 1 tablet (40 mg total) by mouth daily.  30 tablet  10  . SUMAtriptan (IMITREX) 100 MG tablet Take 100 mg by mouth as needed for migraine or headache. May repeat in 2 hours if headache persists or recurs.       No current facility-administered medications for this visit.    Past Medical History  Diagnosis Date  . Hypertension   . Hyperlipidemia   . GERD (gastroesophageal reflux disease)     History reviewed. No pertinent past surgical history.  BZJ:IRCVELF:YB colds or fevers, no weight  changes Skin:no rashes or ulcers HEENT:no blurred vision, no congestion CV:see HPI PUL:see HPI GI:no diarrhea constipation or melena, no indigestion GU:no hematuria, no dysuria MS:+  joint pain of both knees, no claudication Neuro:no syncope, no lightheadedness Endo:no diabetes, no thyroid disease  PHYSICAL EXAM BP 108/72  Pulse 61  Ht 5\' 5"  (1.651 m)  Wt 152 lb 4.8 oz (69.083 kg)  BMI 25.34 kg/m2 General:Pleasant affect, NAD Skin:Warm and dry, brisk capillary refill HEENT:normocephalic, sclera clear, mucus membranes moist Neck:supple, no JVD, no bruits  Heart:S1S2 RRR without murmur, gallup, rub or click Lungs:clear without rales, rhonchi, or wheezes BUL:AGTX, non tender, + BS, do not palpate liver spleen or masses Ext:no lower ext edema, 2+ pedal pulses, 2+ radial pulses Neuro:alert and oriented, MAE,  follows commands, + facial symmetry  EKG:SR normal EKG  ASSESSMENT AND PLAN HTN (hypertension) contolled  Pre-operative clearance, cardiac  Discussed with Dr. Claiborne Billings last echo in stress tests were in 2013 were normal. She has had no chest pain since that time EKG was without any changes. Dr. Claiborne Billings and I have cleared her to undergo surgery for total knee replacement.

## 2013-08-17 NOTE — Assessment & Plan Note (Signed)
contolled 

## 2013-08-18 ENCOUNTER — Encounter: Payer: Self-pay | Admitting: *Deleted

## 2013-08-19 ENCOUNTER — Encounter (HOSPITAL_COMMUNITY): Payer: Self-pay | Admitting: Pharmacy Technician

## 2013-08-19 NOTE — Patient Instructions (Addendum)
Mary [name]  08/19/2013   Your procedure is scheduled on:  08/31/13  135pm-245pm   Report to [facility] at    1030  AM.  Call this number if you have problems the morning of surgery: [phone]   Remember:   Do not eat food after midnite.  May have clear liquids until 0700am then npo.    Take these medicines the morning of surgery with A SIP OF WATER:    Do not wear jewelry, make-up or nail polish.  Do not wear lotions, powders, or perfumes.   Do not shave 48 hours prior to surgery.   Do not bring valuables to the hospital.  Contacts, dentures or bridgework may not be worn into surgery.  Leave suitcase in the car. After surgery it may be brought to your room.  For patients admitted to the hospital, checkout time is 11:00 AM the day of  discharge.      Please read over the following fact sheets that you were given: MRSA Information,[facility] - Preparing for Surgery Before surgery, you can play an important role.  Because skin is not sterile, your skin needs to be as free of germs as possible.  You can reduce the number of germs on your skin by washing with CHG (chlorahexidine gluconate) soap before surgery.  CHG is an antiseptic cleaner which kills germs and bonds with the skin to continue killing germs even after washing. Please DO NOT use if you have an allergy to CHG or antibacterial soaps.  If your skin becomes reddened/irritated stop using the CHG and inform your nurse when you arrive at Short Stay. Do not shave (including legs and underarms) for at least 48 hours prior to the first CHG shower.  You may shave your face. Please follow these instructions carefully:  1.  Shower with CHG Soap the night before surgery and the  morning of Surgery.  2.  If you choose to wash your hair, wash your hair first as usual with your  normal  shampoo.  3.  After you shampoo, rinse your hair and body thoroughly to remove the  shampoo.                           4.  Use CHG as you  would any other liquid soap.  You can apply chg directly  to the skin and wash                       Gently with a scrungie or clean washcloth.  5.  Apply the CHG Soap to your body ONLY FROM THE NECK DOWN.   Do not use on open                           Wound or open sores. Avoid contact with eyes, ears mouth and genitals (private parts).                        Genitals (private parts) with your normal soap.             6.  Wash thoroughly, paying special attention to the area where your surgery  will be performed.  7.  Thoroughly rinse your body with warm water from the neck down.  8.  DO NOT shower/wash with your normal soap after using and rinsing off  the  CHG Soap.                9.  Pat yourself dry with a clean towel.            10.  Wear clean pajamas.            11.  Place clean sheets on your bed the night of your first shower and do not  sleep with pets. Day of Surgery : Do not apply any lotions/deodorants the morning of surgery.  Please wear clean clothes to the hospital/surgery center.  FAILURE TO FOLLOW THESE INSTRUCTIONS MAY RESULT IN THE CANCELLATION OF YOUR SURGERY PATIENT SIGNATURE_________________________________  NURSE SIGNATURE__________________________________  Incentive Spirometer  An incentive spirometer is a tool that can help keep your lungs clear and active. This tool measures how well you are filling your lungs with each breath. Taking long deep breaths may help reverse or decrease the chance of developing breathing (pulmonary) problems (especially infection) following:  A long period of time when you are unable to move or be active. BEFORE THE PROCEDURE   If the spirometer includes an indicator to show your best effort, your nurse or respiratory therapist will set it to a desired goal.  If possible, sit up straight or lean slightly forward. Try not to slouch.  Hold the incentive spirometer in an upright position. INSTRUCTIONS FOR USE  1. Sit on the edge of  your bed if possible, or sit up as far as you can in bed or on a chair. 2. Hold the incentive spirometer in an upright position. 3. Breathe out normally. 4. Place the mouthpiece in your mouth and seal your lips tightly around it. 5. Breathe in slowly and as deeply as possible, raising the piston or the ball toward the top of the column. 6. Hold your breath for 3-5 seconds or for as long as possible. Allow the piston or ball to fall to the bottom of the column. 7. Remove the mouthpiece from your mouth and breathe out normally. 8. Rest for a few seconds and repeat Steps 1 through 7 at least 10 times every 1-2 hours when you are awake. Take your time and take a few normal breaths between deep breaths. 9. The spirometer may include an indicator to show your best effort. Use the indicator as a goal to work toward during each repetition. 10. After each set of 10 deep breaths, practice coughing to be sure your lungs are clear. If you have an incision (the cut made at the time of surgery), support your incision when coughing by placing a pillow or rolled up towels firmly against it. Once you are able to get out of bed, walk around indoors and cough well. You may stop using the incentive spirometer when instructed by your caregiver.  RISKS AND COMPLICATIONS  Take your time so you do not get dizzy or light-headed.  If you are in pain, you may need to take or ask for pain medication before doing incentive spirometry. It is harder to take a deep breath if you are having pain. AFTER USE  Rest and breathe slowly and easily.  It can be helpful to keep track of a log of your progress. Your caregiver can provide you with a simple table to help with this. If you are using the spirometer at home, follow these instructions: [place] IF:   You are having difficultly using the spirometer.  You have trouble using the spirometer as often as instructed.  Your  pain medication is not giving enough relief  while using the spirometer.  You develop fever of 100.5 F (38.1 C) or higher. SEEK IMMEDIATE MEDICAL CARE IF:   You cough up bloody sputum that had not been present before.  You develop fever of 102 F (38.9 C) or greater.  You develop worsening pain at or near the incision site. MAKE SURE YOU:   Understand these instructions.  Will watch your condition.  Will get help right away if you are not doing well or get worse. Document Released: 09/09/2006 Document Revised: 07/22/2011 Document Reviewed: 11/10/2006 [facility] Patient Information 2014 [place], [place].   WHAT IS A BLOOD TRANSFUSION? Blood Transfusion Information  A transfusion is the replacement of blood or some of its parts. Blood is made up of multiple cells which provide different functions.  Red blood cells carry oxygen and are used for blood loss replacement.  White blood cells fight against infection.  Platelets control bleeding.  Plasma helps clot blood.  Other blood products are available for specialized needs, such as hemophilia or other clotting disorders. BEFORE THE TRANSFUSION  Who gives blood for transfusions?   Healthy volunteers who are fully evaluated to make sure their blood is safe. This is blood bank blood. Transfusion therapy is the safest it has ever been in the practice of medicine. Before blood is taken from a donor, a complete history is taken to make sure that person has no history of diseases nor engages in risky social behavior (examples are intravenous drug use or sexual activity with multiple partners). The donor's travel history is screened to minimize risk of transmitting infections, such as malaria. The donated blood is tested for signs of infectious diseases, such as HIV and hepatitis. The blood is then tested to be sure it is compatible with you in order to minimize the chance of a transfusion reaction. If you or a relative donates blood, this is often done in anticipation of surgery and  is not appropriate for emergency situations. It takes many days to process the donated blood. RISKS AND COMPLICATIONS Although transfusion therapy is very safe and saves many lives, the main dangers of transfusion include:   Getting an infectious disease.  Developing a transfusion reaction. This is an allergic reaction to something in the blood you were given. Every precaution is taken to prevent this. The decision to have a blood transfusion has been considered carefully by your caregiver before blood is given. Blood is not given unless the benefits outweigh the risks. AFTER THE TRANSFUSION  Right after receiving a blood transfusion, you will usually feel much better and more energetic. This is especially true if your red blood cells have gotten low (anemic). The transfusion raises the level of the red blood cells which carry oxygen, and this usually causes an energy increase.  The nurse administering the transfusion will monitor you carefully for complications. HOME CARE INSTRUCTIONS  No special instructions are needed after a transfusion. You may find your energy is better. Speak with your caregiver about any limitations on activity for underlying diseases you may have. SEEK MEDICAL CARE IF:   Your condition is not improving after your transfusion.  You develop redness or irritation at the intravenous (IV) site. SEEK IMMEDIATE MEDICAL CARE IF:  Any of the following symptoms occur over the next 12 hours:  Shaking chills.  You have a temperature by mouth above 102 F (38.9 C), not controlled by medicine.  Chest, back, or muscle pain.  People around you feel  you are not acting correctly or are confused.  Shortness of breath or difficulty breathing.  Dizziness and fainting.  You get a rash or develop hives.  You have a decrease in urine output.  Your urine turns a dark color or changes to pink, red, or brown. Any of the following symptoms occur over the next 10 days:  You  have a temperature by mouth above 102 F (38.9 C), not controlled by medicine.  Shortness of breath.  Weakness after normal activity.  The white part of the eye turns yellow (jaundice).  You have a decrease in the amount of urine or are urinating less often.  Your urine turns a dark color or changes to pink, red, or brown. Document Released: 04/26/2000 Document Revised: 07/22/2011 Document Reviewed: 12/14/2007 [facility] Patient Information 2014 [place].                 CLEAR LIQUID DIET   Foods Allowed                                                                     Foods Excluded  Coffee and tea, regular and decaf                             liquids that you cannot  Plain Jell-O in any flavor                                             see through such as: Fruit ices (not with fruit pulp)                                     milk, soups, orange juice  Iced Popsicles                                    All solid food Carbonated beverages, regular and diet                                    Cranberry, grape and apple juices Sports drinks like Gatorade Lightly seasoned clear broth or consume(fat free) Sugar, honey syrup  Sample Menu Breakfast                                Lunch                                     Supper Cranberry juice                    Beef broth                            Chicken broth Jell-O  Grape juice                           Apple juice Coffee or tea                        Jell-O                                      Popsicle                                                Coffee or tea                        Coffee or tea

## 2013-08-20 ENCOUNTER — Ambulatory Visit (HOSPITAL_COMMUNITY)
Admission: RE | Admit: 2013-08-20 | Discharge: 2013-08-20 | Disposition: A | Discharge status: Home / Self Care | Payer: BC Managed Care – PPO | Source: Ambulatory Visit | Attending: Anesthesiology | Admitting: Anesthesiology

## 2013-08-20 ENCOUNTER — Encounter (HOSPITAL_COMMUNITY): Payer: Self-pay

## 2013-08-20 ENCOUNTER — Encounter (HOSPITAL_COMMUNITY)
Admission: RE | Admit: 2013-08-20 | Discharge: 2013-08-20 | Disposition: A | Discharge status: Home / Self Care | Payer: BC Managed Care – PPO | Source: Ambulatory Visit | Attending: Orthopedic Surgery | Admitting: Orthopedic Surgery

## 2013-08-20 DIAGNOSIS — Z01818 Encounter for other preprocedural examination: Secondary | ICD-10-CM | POA: Insufficient documentation

## 2013-08-20 DIAGNOSIS — Z01812 Encounter for preprocedural laboratory examination: Secondary | ICD-10-CM | POA: Insufficient documentation

## 2013-08-20 HISTORY — DX: Unspecified osteoarthritis, unspecified site: M19.90

## 2013-08-20 HISTORY — DX: Anemia, unspecified: D64.9

## 2013-08-20 HISTORY — DX: Other specified postprocedural states: Z98.890

## 2013-08-20 HISTORY — DX: Nausea with vomiting, unspecified: R11.2

## 2013-08-20 HISTORY — DX: Edema, unspecified: R60.9

## 2013-08-20 HISTORY — DX: Headache: R51

## 2013-08-20 HISTORY — DX: Adverse effect of unspecified anesthetic, initial encounter: T41.45XA

## 2013-08-20 HISTORY — DX: Other complications of anesthesia, initial encounter: T88.59XA

## 2013-08-20 HISTORY — DX: Chronic kidney disease, unspecified: N18.9

## 2013-08-20 HISTORY — DX: Nonrheumatic mitral (valve) prolapse: I34.1

## 2013-08-20 LAB — BASIC METABOLIC PANEL
BUN: 20 mg/dL (ref 6–23)
CO2: 28 mEq/L (ref 19–32)
Calcium: 9.5 mg/dL (ref 8.4–10.5)
Chloride: 105 mEq/L (ref 96–112)
Creatinine, Ser: 0.89 mg/dL (ref 0.50–1.10)
GFR, EST AFRICAN AMERICAN: 78 mL/min — AB (ref >90)
GFR, EST NON AFRICAN AMERICAN: 67 mL/min — AB (ref >90)
Glucose, Bld: 84 mg/dL (ref 70–99)
POTASSIUM: 4.4 meq/L (ref 3.7–5.3)
SODIUM: 144 meq/L (ref 137–147)

## 2013-08-20 LAB — CBC
HCT: 40.7 % (ref 36.0–46.0)
HEMOGLOBIN: 13.4 g/dL (ref 12.0–15.0)
MCH: 29.8 pg (ref 26.0–34.0)
MCHC: 32.9 g/dL (ref 30.0–36.0)
MCV: 90.6 fL (ref 78.0–100.0)
Platelets: 221 10*3/uL (ref 150–400)
RBC: 4.49 MIL/uL (ref 3.87–5.11)
RDW: 12.9 % (ref 11.5–15.5)
WBC: 6.2 10*3/uL (ref 4.0–10.5)

## 2013-08-20 LAB — URINALYSIS, ROUTINE W REFLEX MICROSCOPIC
Bilirubin Urine: NEGATIVE
Glucose, UA: NEGATIVE mg/dL
Ketones, ur: NEGATIVE mg/dL
LEUKOCYTES UA: NEGATIVE
NITRITE: NEGATIVE
Protein, ur: NEGATIVE mg/dL
SPECIFIC GRAVITY, URINE: 1.015 (ref 1.005–1.030)
Urobilinogen, UA: 0.2 mg/dL (ref 0.0–1.0)
pH: 7 (ref 5.0–8.0)

## 2013-08-20 LAB — SURGICAL PCR SCREEN
MRSA, PCR: NEGATIVE
STAPHYLOCOCCUS AUREUS: NEGATIVE

## 2013-08-20 LAB — PROTIME-INR
INR: 0.96 (ref 0.00–1.49)
PROTHROMBIN TIME: 12.6 s (ref 11.6–15.2)

## 2013-08-20 LAB — APTT: APTT: 40 s — AB (ref 24–37)

## 2013-08-20 LAB — URINE MICROSCOPIC-ADD ON

## 2013-08-20 NOTE — Addendum Note (Signed)
Addended by: Vennie Homans on: 08/20/2013 02:58 PM   Modules accepted: Orders

## 2013-08-20 NOTE — Progress Notes (Signed)
Clearance on chart from Dr Corky Downs  Last office visit with Dr Corky Downs- 05/20/13  EPIC  EKG 05/20/13 EPIC  Last office visit with Cecilie Kicks NP 08/17/13 EPIC

## 2013-08-20 NOTE — Progress Notes (Signed)
Last office visit with Dr Claiborne Billings 05/20/13 EPIC

## 2013-08-26 NOTE — H&P (Signed)
TOTAL KNEE ADMISSION H&P  Patient is being admitted for right total knee arthroplasty.  Subjective:  Chief Complaint:      Right knee OA / pain.  HPI: Mary [name], 63 y.o. female, has a history of pain and functional disability in the right knee due to arthritis and has failed non-surgical conservative treatments for greater than 12 weeks to includeNSAID's and/or analgesics, corticosteriod injections, viscosupplementation injections and activity modification.  Onset of symptoms was gradual, starting 6-7 years ago with gradually worsening course since that time. The patient noted no past surgery on the right knee(s).  Patient currently rates pain in the right knee(s) at 8 out of 10 with activity. Patient has night pain, worsening of pain with activity and weight bearing, pain that interferes with activities of daily living, pain with passive range of motion, crepitus and joint swelling.  Patient has evidence of periarticular osteophytes and joint space narrowing by imaging studies.  There is no active infection.  Risks, benefits and expectations were discussed with the patient.  Risks including but not limited to the risk of anesthesia, blood clots, nerve damage, blood vessel damage, failure of the prosthesis, infection and up to and including death.  Patient understand the risks, benefits and expectations and wishes to proceed with surgery.   D/C Plans:   Home with HHPT  Post-op Meds:    No Rx given  Tranexamic Acid:   To be given  Decadron:    To be given  FYI:    ASA post-op  Norco post-op    Patient Active Problem List   Diagnosis Date Noted  . Pre-operative clearance, cardiac 08/17/2013  . HTN (hypertension) 05/20/2013  . Hyperlipidemia 05/20/2013  . MIGRAINE HEADACHE 02/23/2007  . ALLERGIC RHINITIS 02/23/2007  . TRACHEOBRONCHITIS 02/23/2007  . COUGH 02/23/2007   Past Medical History  Diagnosis Date  . Hypertension   . Hyperlipidemia   . GERD (gastroesophageal reflux  disease)   . Complication of anesthesia     patient states does not take much medicine to put her to sleep   . PONV (postoperative nausea and vomiting)   . Heart murmur   . Mitral valve prolapse     no symptoms   . Edema     ankles to knees   . Chronic kidney disease     idiopathic micro hematuria   . Headache(784.0)     hx of migraines   . Arthritis   . Anemia     Past Surgical History  Procedure Laterality Date  . Veins stripping       bilateral legs   . Tubal ligation  1986  . Cervical fusion  2006    cervical 5-6     No prescriptions prior to admission   Allergies  Allergen Reactions  . Sporanox [Itraconazole] Swelling    Bilateral knee and ankle swelling and pain     History  Substance Use Topics  . Smoking status: Former Smoker    Types: Cigarettes    Quit date: 05/13/1988  . Smokeless tobacco: Never Used  . Alcohol Use: Yes     Comment: wine- rarely.    Family History  Problem Relation Age of Onset  . Dementia Mother   . Heart attack Father   . Heart disease Father   . Kidney failure Father   . Heart attack Brother     age 49  . Cancer - Other Brother     kidney     Review of Systems  Constitutional: Negative.   Eyes: Negative.   Respiratory: Negative.   Cardiovascular: Negative.   Gastrointestinal: Positive for heartburn.  Genitourinary: Negative.   Musculoskeletal: Positive for joint pain.  Skin: Negative.   Neurological: Positive for headaches.  Endo/Heme/Allergies: Positive for environmental allergies.  Psychiatric/Behavioral: Negative.     Objective:  Physical Exam  Constitutional: She is oriented to person, place, and time. She appears well-developed and well-nourished.  HENT:  Head: Normocephalic and atraumatic.  Mouth/Throat: Oropharynx is clear and moist.  Eyes: Pupils are equal, round, and reactive to light.  Neck: Neck supple. No JVD present. No tracheal deviation present. No thyromegaly present.  Cardiovascular: Normal  rate, regular rhythm, normal heart sounds and intact distal pulses.   Respiratory: Effort normal and breath sounds normal. No stridor. No respiratory distress. She has no wheezes.  GI: Soft. There is no tenderness. There is no guarding.  Musculoskeletal:       Right knee: She exhibits decreased range of motion, swelling and bony tenderness. She exhibits no effusion, no ecchymosis, no deformity and no laceration. Tenderness found.  Lymphadenopathy:    She has no cervical adenopathy.  Neurological: She is alert and oriented to person, place, and time.  Skin: Skin is warm and dry.  Psychiatric: She has a normal mood and affect.     Labs:  Estimated body mass index is 24.86 kg/(m^2) as calculated from the following:   Height as of 05/20/13: 5\' 5"  (1.651 m).   Weight as of 05/20/13: 67.767 kg (149 lb 6.4 oz).   Imaging Review Plain radiographs demonstrate severe degenerative joint disease of the right knee(s). The overall alignment is neutral. The bone quality appears to be good for age and reported activity level.  Assessment/Plan:  End stage arthritis, right knee   The patient history, physical examination, clinical judgment of the provider and imaging studies are consistent with end stage degenerative joint disease of the right knee(s) and total knee arthroplasty is deemed medically necessary. The treatment options including medical management, injection therapy arthroscopy and arthroplasty were discussed at length. The risks and benefits of total knee arthroplasty were presented and reviewed. The risks due to aseptic loosening, infection, stiffness, patella tracking problems, thromboembolic complications and other imponderables were discussed. The patient acknowledged the explanation, agreed to proceed with the plan and consent was signed. Patient is being admitted for inpatient treatment for surgery, pain control, PT, OT, prophylactic antibiotics, VTE prophylaxis, progressive ambulation and  ADL's and discharge planning. The patient is planning to be discharged home with home health services.    West Pugh Van Ehlert   PAC  08/26/2013, 9:11 PM

## 2013-08-31 ENCOUNTER — Inpatient Hospital Stay (HOSPITAL_COMMUNITY): Payer: BC Managed Care – PPO | Admitting: Anesthesiology

## 2013-08-31 ENCOUNTER — Encounter (HOSPITAL_COMMUNITY): Payer: Self-pay | Admitting: *Deleted

## 2013-08-31 ENCOUNTER — Encounter (HOSPITAL_COMMUNITY): Payer: BC Managed Care – PPO | Admitting: Anesthesiology

## 2013-08-31 ENCOUNTER — Encounter (HOSPITAL_COMMUNITY)
Admission: RE | Disposition: A | Discharge status: Home Health | Payer: Self-pay | Source: Ambulatory Visit | Attending: Orthopedic Surgery

## 2013-08-31 ENCOUNTER — Inpatient Hospital Stay (HOSPITAL_COMMUNITY)
Admission: RE | Admit: 2013-08-31 | Discharge: 2013-09-01 | DRG: 470 | Disposition: A | Discharge status: Home Health | Payer: BC Managed Care – PPO | Source: Ambulatory Visit | Attending: Orthopedic Surgery | Admitting: Orthopedic Surgery

## 2013-08-31 DIAGNOSIS — Z96659 Presence of unspecified artificial knee joint: Secondary | ICD-10-CM

## 2013-08-31 DIAGNOSIS — D500 Iron deficiency anemia secondary to blood loss (chronic): Secondary | ICD-10-CM | POA: Diagnosis not present

## 2013-08-31 DIAGNOSIS — Z8249 Family history of ischemic heart disease and other diseases of the circulatory system: Secondary | ICD-10-CM

## 2013-08-31 DIAGNOSIS — Z981 Arthrodesis status: Secondary | ICD-10-CM

## 2013-08-31 DIAGNOSIS — N189 Chronic kidney disease, unspecified: Secondary | ICD-10-CM | POA: Diagnosis present

## 2013-08-31 DIAGNOSIS — D62 Acute posthemorrhagic anemia: Secondary | ICD-10-CM | POA: Diagnosis not present

## 2013-08-31 DIAGNOSIS — I059 Rheumatic mitral valve disease, unspecified: Secondary | ICD-10-CM | POA: Diagnosis present

## 2013-08-31 DIAGNOSIS — E785 Hyperlipidemia, unspecified: Secondary | ICD-10-CM | POA: Diagnosis present

## 2013-08-31 DIAGNOSIS — IMO0002 Reserved for concepts with insufficient information to code with codable children: Secondary | ICD-10-CM

## 2013-08-31 DIAGNOSIS — I129 Hypertensive chronic kidney disease with stage 1 through stage 4 chronic kidney disease, or unspecified chronic kidney disease: Secondary | ICD-10-CM | POA: Diagnosis present

## 2013-08-31 DIAGNOSIS — M179 Osteoarthritis of knee, unspecified: Secondary | ICD-10-CM | POA: Diagnosis present

## 2013-08-31 DIAGNOSIS — K219 Gastro-esophageal reflux disease without esophagitis: Secondary | ICD-10-CM | POA: Diagnosis present

## 2013-08-31 DIAGNOSIS — M1710 Unilateral primary osteoarthritis, unspecified knee: Principal | ICD-10-CM | POA: Diagnosis present

## 2013-08-31 DIAGNOSIS — Z87891 Personal history of nicotine dependence: Secondary | ICD-10-CM

## 2013-08-31 DIAGNOSIS — M6580 Other synovitis and tenosynovitis, unspecified site: Secondary | ICD-10-CM | POA: Diagnosis present

## 2013-08-31 HISTORY — PX: TOTAL KNEE ARTHROPLASTY: SHX125

## 2013-08-31 LAB — TYPE AND SCREEN
ABO/RH(D): O POS
Antibody Screen: NEGATIVE

## 2013-08-31 LAB — ABO/RH: ABO/RH(D): O POS

## 2013-08-31 SURGERY — ARTHROPLASTY, KNEE, TOTAL
Anesthesia: Spinal | Site: Knee | Laterality: Right

## 2013-08-31 MED ORDER — METHOCARBAMOL 100 MG/ML IJ SOLN
500.0000 mg | Freq: Four times a day (QID) | INTRAVENOUS | Status: DC | PRN
  Administered 2013-08-31: 500 mg via INTRAVENOUS
  Filled 2013-08-31 (×2): qty 5

## 2013-08-31 MED ORDER — OXYCODONE HCL 5 MG PO TABS: 5.0000 mg | ORAL_TABLET | Freq: Once | ORAL | Status: DC | PRN

## 2013-08-31 MED ORDER — PROPOFOL INFUSION 10 MG/ML OPTIME
INTRAVENOUS | Status: DC | PRN
  Administered 2013-08-31: 50 ug/kg/min via INTRAVENOUS

## 2013-08-31 MED ORDER — CEFAZOLIN SODIUM-DEXTROSE 2-3 GM-% IV SOLR
2.0000 g | Freq: Four times a day (QID) | INTRAVENOUS | Status: AC
  Administered 2013-08-31 (×2): 2 g via INTRAVENOUS
  Filled 2013-08-31 (×2): qty 50

## 2013-08-31 MED ORDER — MAGNESIUM CITRATE PO SOLN: 1.0000 | Freq: Once | ORAL | Status: AC | PRN

## 2013-08-31 MED ORDER — PHENYLEPHRINE 40 MCG/ML (10ML) SYRINGE FOR IV PUSH (FOR BLOOD PRESSURE SUPPORT)
PREFILLED_SYRINGE | INTRAVENOUS | Status: AC
  Filled 2013-08-31: qty 10

## 2013-08-31 MED ORDER — FERROUS SULFATE 325 (65 FE) MG PO TABS
325.0000 mg | ORAL_TABLET | Freq: Three times a day (TID) | ORAL | Status: DC
  Administered 2013-09-01 (×2): 325 mg via ORAL
  Filled 2013-08-31 (×5): qty 1

## 2013-08-31 MED ORDER — MENTHOL 3 MG MT LOZG
1.0000 | LOZENGE | OROMUCOSAL | Status: DC | PRN
  Filled 2013-08-31: qty 9

## 2013-08-31 MED ORDER — STERILE WATER FOR IRRIGATION IR SOLN
Status: DC | PRN
  Administered 2013-08-31: 1500 mL

## 2013-08-31 MED ORDER — CEFAZOLIN SODIUM-DEXTROSE 2-3 GM-% IV SOLR
INTRAVENOUS | Status: AC
  Filled 2013-08-31: qty 50

## 2013-08-31 MED ORDER — POLYETHYLENE GLYCOL 3350 17 G PO PACK
17.0000 g | PACK | Freq: Two times a day (BID) | ORAL | Status: DC
  Administered 2013-08-31 – 2013-09-01 (×2): 17 g via ORAL

## 2013-08-31 MED ORDER — DEXAMETHASONE SODIUM PHOSPHATE 10 MG/ML IJ SOLN
INTRAMUSCULAR | Status: AC
  Filled 2013-08-31: qty 1

## 2013-08-31 MED ORDER — ACETAMINOPHEN 650 MG RE SUPP: 650.0000 mg | Freq: Four times a day (QID) | RECTAL | Status: DC | PRN

## 2013-08-31 MED ORDER — METHOCARBAMOL 500 MG PO TABS: 500.0000 mg | ORAL_TABLET | Freq: Four times a day (QID) | ORAL | Status: DC | PRN

## 2013-08-31 MED ORDER — PROPOFOL 10 MG/ML IV BOLUS
INTRAVENOUS | Status: DC | PRN
  Administered 2013-08-31: 20 mg via INTRAVENOUS

## 2013-08-31 MED ORDER — HYDROMORPHONE HCL PF 1 MG/ML IJ SOLN
INTRAMUSCULAR | Status: AC
  Filled 2013-08-31: qty 1

## 2013-08-31 MED ORDER — KETOROLAC TROMETHAMINE 30 MG/ML IJ SOLN
INTRAMUSCULAR | Status: AC
  Filled 2013-08-31: qty 1

## 2013-08-31 MED ORDER — ACETAMINOPHEN 325 MG PO TABS
650.0000 mg | ORAL_TABLET | Freq: Four times a day (QID) | ORAL | Status: DC | PRN
  Administered 2013-09-01: 650 mg via ORAL
  Filled 2013-08-31: qty 2

## 2013-08-31 MED ORDER — HYDROMORPHONE HCL PF 1 MG/ML IJ SOLN
0.2500 mg | INTRAMUSCULAR | Status: DC | PRN
  Administered 2013-08-31 (×4): 0.5 mg via INTRAVENOUS

## 2013-08-31 MED ORDER — BISACODYL 10 MG RE SUPP: 10.0000 mg | Freq: Every day | RECTAL | Status: DC | PRN

## 2013-08-31 MED ORDER — FENTANYL CITRATE 0.05 MG/ML IJ SOLN
INTRAMUSCULAR | Status: DC | PRN
  Administered 2013-08-31 (×2): 50 ug via INTRAVENOUS

## 2013-08-31 MED ORDER — SODIUM CHLORIDE 0.9 % IJ SOLN
INTRAMUSCULAR | Status: AC
  Filled 2013-08-31: qty 50

## 2013-08-31 MED ORDER — LORATADINE 10 MG PO TABS
10.0000 mg | ORAL_TABLET | Freq: Every day | ORAL | Status: DC
  Administered 2013-09-01: 10 mg via ORAL
  Filled 2013-08-31: qty 1

## 2013-08-31 MED ORDER — DEXAMETHASONE SODIUM PHOSPHATE 10 MG/ML IJ SOLN
10.0000 mg | Freq: Once | INTRAMUSCULAR | Status: AC
  Administered 2013-08-31: 10 mg via INTRAVENOUS

## 2013-08-31 MED ORDER — ATORVASTATIN CALCIUM 80 MG PO TABS
80.0000 mg | ORAL_TABLET | Freq: Every day | ORAL | Status: DC
  Administered 2013-08-31: 80 mg via ORAL
  Filled 2013-08-31 (×2): qty 1

## 2013-08-31 MED ORDER — DIPHENHYDRAMINE HCL 25 MG PO CAPS: 25.0000 mg | ORAL_CAPSULE | Freq: Four times a day (QID) | ORAL | Status: DC | PRN

## 2013-08-31 MED ORDER — HYDROCODONE-ACETAMINOPHEN 7.5-325 MG PO TABS
1.0000 | ORAL_TABLET | ORAL | Status: DC
  Administered 2013-08-31: 1 via ORAL
  Administered 2013-08-31 – 2013-09-01 (×3): 2 via ORAL
  Filled 2013-08-31 (×3): qty 2
  Filled 2013-08-31: qty 1
  Filled 2013-08-31: qty 2

## 2013-08-31 MED ORDER — SODIUM CHLORIDE 0.9 % IV SOLN
INTRAVENOUS | Status: DC
  Administered 2013-08-31: 15:00:00 via INTRAVENOUS
  Filled 2013-08-31 (×8): qty 1000

## 2013-08-31 MED ORDER — METOCLOPRAMIDE HCL 5 MG/ML IJ SOLN
5.0000 mg | Freq: Three times a day (TID) | INTRAMUSCULAR | Status: DC | PRN
  Administered 2013-08-31: 10 mg via INTRAVENOUS
  Filled 2013-08-31: qty 2

## 2013-08-31 MED ORDER — MIDAZOLAM HCL 2 MG/2ML IJ SOLN
INTRAMUSCULAR | Status: AC
Start: 2013-08-31 — End: 2013-08-31
  Filled 2013-08-31: qty 2

## 2013-08-31 MED ORDER — BUPIVACAINE-EPINEPHRINE 0.25% -1:200000 IJ SOLN
INTRAMUSCULAR | Status: AC
  Filled 2013-08-31: qty 1

## 2013-08-31 MED ORDER — DIPHENHYDRAMINE HCL 12.5 MG/5ML PO ELIX: 12.5000 mg | ORAL_SOLUTION | ORAL | Status: DC | PRN

## 2013-08-31 MED ORDER — BUPIVACAINE LIPOSOME 1.3 % IJ SUSP
20.0000 mL | Freq: Once | INTRAMUSCULAR | Status: AC
  Administered 2013-08-31: 20 mL
  Filled 2013-08-31: qty 20

## 2013-08-31 MED ORDER — ALUM & MAG HYDROXIDE-SIMETH 200-200-20 MG/5ML PO SUSP: 30.0000 mL | ORAL | Status: DC | PRN

## 2013-08-31 MED ORDER — DEXAMETHASONE SODIUM PHOSPHATE 10 MG/ML IJ SOLN
10.0000 mg | Freq: Once | INTRAMUSCULAR | Status: AC
  Administered 2013-09-01: 10 mg via INTRAVENOUS
  Filled 2013-08-31: qty 1

## 2013-08-31 MED ORDER — 0.9 % SODIUM CHLORIDE (POUR BTL) OPTIME
TOPICAL | Status: DC | PRN
  Administered 2013-08-31: 1000 mL

## 2013-08-31 MED ORDER — DOCUSATE SODIUM 100 MG PO CAPS
100.0000 mg | ORAL_CAPSULE | Freq: Two times a day (BID) | ORAL | Status: DC
  Administered 2013-08-31 – 2013-09-01 (×2): 100 mg via ORAL

## 2013-08-31 MED ORDER — EPHEDRINE SULFATE 50 MG/ML IJ SOLN
INTRAMUSCULAR | Status: AC
Start: 2013-08-31 — End: 2013-08-31
  Filled 2013-08-31: qty 1

## 2013-08-31 MED ORDER — PROPOFOL 10 MG/ML IV BOLUS
INTRAVENOUS | Status: AC
  Filled 2013-08-31: qty 20

## 2013-08-31 MED ORDER — PROMETHAZINE HCL 25 MG/ML IJ SOLN: 6.2500 mg | INTRAMUSCULAR | Status: DC | PRN

## 2013-08-31 MED ORDER — ONDANSETRON HCL 4 MG/2ML IJ SOLN
4.0000 mg | Freq: Four times a day (QID) | INTRAMUSCULAR | Status: DC | PRN
  Administered 2013-08-31: 4 mg via INTRAVENOUS
  Filled 2013-08-31: qty 2

## 2013-08-31 MED ORDER — KETAMINE HCL 10 MG/ML IJ SOLN
INTRAMUSCULAR | Status: AC
  Filled 2013-08-31: qty 1

## 2013-08-31 MED ORDER — BUPIVACAINE IN DEXTROSE 0.75-8.25 % IT SOLN
INTRATHECAL | Status: DC | PRN
  Administered 2013-08-31: 1.6 mL via INTRATHECAL

## 2013-08-31 MED ORDER — AMLODIPINE BESYLATE 10 MG PO TABS
10.0000 mg | ORAL_TABLET | Freq: Every morning | ORAL | Status: DC
  Administered 2013-09-01: 10 mg via ORAL
  Filled 2013-08-31: qty 1

## 2013-08-31 MED ORDER — CEFAZOLIN SODIUM-DEXTROSE 2-3 GM-% IV SOLR
2.0000 g | INTRAVENOUS | Status: AC
  Administered 2013-08-31: 2 g via INTRAVENOUS

## 2013-08-31 MED ORDER — HYDROMORPHONE HCL PF 1 MG/ML IJ SOLN
0.5000 mg | INTRAMUSCULAR | Status: DC | PRN
  Administered 2013-08-31 (×2): 0.5 mg via INTRAVENOUS
  Filled 2013-08-31: qty 1

## 2013-08-31 MED ORDER — BUPIVACAINE-EPINEPHRINE (PF) 0.25% -1:200000 IJ SOLN
INTRAMUSCULAR | Status: DC | PRN
  Administered 2013-08-31: 25 mL

## 2013-08-31 MED ORDER — ONDANSETRON HCL 4 MG PO TABS: 4.0000 mg | ORAL_TABLET | Freq: Four times a day (QID) | ORAL | Status: DC | PRN

## 2013-08-31 MED ORDER — KETAMINE HCL 10 MG/ML IJ SOLN
INTRAMUSCULAR | Status: DC | PRN
  Administered 2013-08-31 (×2): 20 mg via INTRAVENOUS

## 2013-08-31 MED ORDER — OXYCODONE HCL 5 MG/5ML PO SOLN
5.0000 mg | Freq: Once | ORAL | Status: DC | PRN
  Filled 2013-08-31: qty 5

## 2013-08-31 MED ORDER — LACTATED RINGERS IV SOLN
INTRAVENOUS | Status: DC
  Administered 2013-08-31: 12:00:00 via INTRAVENOUS
  Administered 2013-08-31: 1000 mL via INTRAVENOUS

## 2013-08-31 MED ORDER — TRANEXAMIC ACID 100 MG/ML IV SOLN
1000.0000 mg | Freq: Once | INTRAVENOUS | Status: AC
  Administered 2013-08-31: 1000 mg via INTRAVENOUS
  Filled 2013-08-31: qty 10

## 2013-08-31 MED ORDER — EPHEDRINE SULFATE 50 MG/ML IJ SOLN
INTRAMUSCULAR | Status: DC | PRN
  Administered 2013-08-31 (×2): 5 mg via INTRAVENOUS

## 2013-08-31 MED ORDER — FENTANYL CITRATE 0.05 MG/ML IJ SOLN
INTRAMUSCULAR | Status: AC
  Filled 2013-08-31: qty 2

## 2013-08-31 MED ORDER — ONDANSETRON HCL 4 MG/2ML IJ SOLN
INTRAMUSCULAR | Status: AC
  Filled 2013-08-31: qty 2

## 2013-08-31 MED ORDER — KETOTIFEN FUMARATE 0.025 % OP SOLN
1.0000 [drp] | Freq: Two times a day (BID) | OPHTHALMIC | Status: DC | PRN
  Filled 2013-08-31: qty 5

## 2013-08-31 MED ORDER — CELECOXIB 200 MG PO CAPS
200.0000 mg | ORAL_CAPSULE | Freq: Two times a day (BID) | ORAL | Status: DC
  Administered 2013-09-01: 200 mg via ORAL
  Filled 2013-08-31 (×2): qty 1

## 2013-08-31 MED ORDER — MEPERIDINE HCL 50 MG/ML IJ SOLN: 6.2500 mg | INTRAMUSCULAR | Status: DC | PRN

## 2013-08-31 MED ORDER — MIDAZOLAM HCL 5 MG/5ML IJ SOLN
INTRAMUSCULAR | Status: DC | PRN
  Administered 2013-08-31 (×2): 1 mg via INTRAVENOUS

## 2013-08-31 MED ORDER — CHLORHEXIDINE GLUCONATE 4 % EX LIQD: 60.0000 mL | Freq: Once | CUTANEOUS | Status: DC

## 2013-08-31 MED ORDER — METOCLOPRAMIDE HCL 10 MG PO TABS: 5.0000 mg | ORAL_TABLET | Freq: Three times a day (TID) | ORAL | Status: DC | PRN

## 2013-08-31 MED ORDER — SODIUM CHLORIDE 0.9 % IJ SOLN
INTRAMUSCULAR | Status: AC
  Filled 2013-08-31: qty 10

## 2013-08-31 MED ORDER — ONDANSETRON HCL 4 MG/2ML IJ SOLN
INTRAMUSCULAR | Status: DC | PRN
  Administered 2013-08-31: 4 mg via INTRAVENOUS

## 2013-08-31 MED ORDER — SUMATRIPTAN SUCCINATE 100 MG PO TABS
100.0000 mg | ORAL_TABLET | ORAL | Status: DC | PRN
  Filled 2013-08-31: qty 1

## 2013-08-31 MED ORDER — ASPIRIN EC 325 MG PO TBEC
325.0000 mg | DELAYED_RELEASE_TABLET | Freq: Two times a day (BID) | ORAL | Status: DC
  Administered 2013-09-01: 325 mg via ORAL
  Filled 2013-08-31 (×3): qty 1

## 2013-08-31 MED ORDER — PHENOL 1.4 % MT LIQD
1.0000 | OROMUCOSAL | Status: DC | PRN
  Filled 2013-08-31: qty 177

## 2013-08-31 SURGICAL SUPPLY — 61 items
ADH SKN CLS APL DERMABOND .7 (GAUZE/BANDAGES/DRESSINGS) ×1
BAG SPEC THK2 15X12 ZIP CLS (MISCELLANEOUS) ×1
BAG ZIPLOCK 12X15 (MISCELLANEOUS) ×3 IMPLANT
BANDAGE ELASTIC 6 VELCRO ST LF (GAUZE/BANDAGES/DRESSINGS) ×3 IMPLANT
BANDAGE ESMARK 6X9 LF (GAUZE/BANDAGES/DRESSINGS) ×1 IMPLANT
BLADE SAW SGTL 13.0X1.19X90.0M (BLADE) ×3 IMPLANT
BNDG CMPR 9X6 STRL LF SNTH (GAUZE/BANDAGES/DRESSINGS) ×1
BNDG ESMARK 6X9 LF (GAUZE/BANDAGES/DRESSINGS) ×3
BOWL SMART MIX CTS (DISPOSABLE) ×3 IMPLANT
CAPT RP KNEE ×2 IMPLANT
CEMENT HV SMART SET (Cement) ×4 IMPLANT
CUFF TOURN SGL QUICK 34 (TOURNIQUET CUFF) ×3
CUFF TRNQT CYL 34X4X40X1 (TOURNIQUET CUFF) ×1 IMPLANT
DECANTER SPIKE VIAL GLASS SM (MISCELLANEOUS) ×3 IMPLANT
DERMABOND ADVANCED (GAUZE/BANDAGES/DRESSINGS) ×2
DERMABOND ADVANCED .7 DNX12 (GAUZE/BANDAGES/DRESSINGS) ×1 IMPLANT
DRAPE EXTREMITY T 121X128X90 (DRAPE) ×3 IMPLANT
DRAPE POUCH INSTRU U-SHP 10X18 (DRAPES) ×3 IMPLANT
DRAPE U-SHAPE 47X51 STRL (DRAPES) ×3 IMPLANT
DRSG AQUACEL AG ADV 3.5X10 (GAUZE/BANDAGES/DRESSINGS) ×3 IMPLANT
DRSG TEGADERM 4X4.75 (GAUZE/BANDAGES/DRESSINGS) IMPLANT
DURAPREP 26ML APPLICATOR (WOUND CARE) ×6 IMPLANT
ELECT REM PT RETURN 9FT ADLT (ELECTROSURGICAL) ×3
ELECTRODE REM PT RTRN 9FT ADLT (ELECTROSURGICAL) ×1 IMPLANT
EVACUATOR 1/8 PVC DRAIN (DRAIN) IMPLANT
FACESHIELD WRAPAROUND (MASK) ×15 IMPLANT
FACESHIELD WRAPAROUND OR TEAM (MASK) ×5 IMPLANT
GAUZE SPONGE 2X2 8PLY STRL LF (GAUZE/BANDAGES/DRESSINGS) IMPLANT
GLOVE BIOGEL PI IND STRL 7.5 (GLOVE) ×1 IMPLANT
GLOVE BIOGEL PI IND STRL 8 (GLOVE) ×1 IMPLANT
GLOVE BIOGEL PI INDICATOR 7.5 (GLOVE) ×2
GLOVE BIOGEL PI INDICATOR 8 (GLOVE) ×6
GLOVE ECLIPSE 8.0 STRL XLNG CF (GLOVE) ×7 IMPLANT
GLOVE ORTHO TXT STRL SZ7.5 (GLOVE) ×6 IMPLANT
GLOVE SURG SS PI 6.5 STRL IVOR (GLOVE) ×4 IMPLANT
GOWN SPEC L3 XXLG W/TWL (GOWN DISPOSABLE) ×3 IMPLANT
GOWN STRL REUS W/TWL LRG LVL3 (GOWN DISPOSABLE) ×3 IMPLANT
HANDPIECE INTERPULSE COAX TIP (DISPOSABLE) ×3
KIT BASIN OR (CUSTOM PROCEDURE TRAY) ×3 IMPLANT
MANIFOLD NEPTUNE II (INSTRUMENTS) ×3 IMPLANT
NDL SAFETY ECLIPSE 18X1.5 (NEEDLE) ×1 IMPLANT
NEEDLE HYPO 18GX1.5 SHARP (NEEDLE) ×3
NS IRRIG 1000ML POUR BTL (IV SOLUTION) ×3 IMPLANT
PACK ICE MAXI GEL EZY WRAP (MISCELLANEOUS) ×2 IMPLANT
PACK TOTAL JOINT (CUSTOM PROCEDURE TRAY) ×3 IMPLANT
POSITIONER SURGICAL ARM (MISCELLANEOUS) ×3 IMPLANT
SET HNDPC FAN SPRY TIP SCT (DISPOSABLE) ×1 IMPLANT
SET PAD KNEE POSITIONER (MISCELLANEOUS) ×3 IMPLANT
SPONGE GAUZE 2X2 STER 10/PKG (GAUZE/BANDAGES/DRESSINGS)
SUCTION FRAZIER 12FR DISP (SUCTIONS) ×3 IMPLANT
SUT MNCRL AB 4-0 PS2 18 (SUTURE) ×3 IMPLANT
SUT VIC AB 1 CT1 36 (SUTURE) ×3 IMPLANT
SUT VIC AB 2-0 CT1 27 (SUTURE) ×9
SUT VIC AB 2-0 CT1 TAPERPNT 27 (SUTURE) ×3 IMPLANT
SUT VLOC 180 0 24IN GS25 (SUTURE) ×3 IMPLANT
SYR 50ML LL SCALE MARK (SYRINGE) ×3 IMPLANT
TOWEL OR 17X26 10 PK STRL BLUE (TOWEL DISPOSABLE) ×3 IMPLANT
TOWEL OR NON WOVEN STRL DISP B (DISPOSABLE) IMPLANT
TRAY FOLEY CATH 14FRSI W/METER (CATHETERS) ×3 IMPLANT
WATER STERILE IRR 1500ML POUR (IV SOLUTION) ×3 IMPLANT
WRAP KNEE MAXI GEL POST OP (GAUZE/BANDAGES/DRESSINGS) ×3 IMPLANT

## 2013-08-31 NOTE — Anesthesia Procedure Notes (Signed)
Spinal  Patient location during procedure: OR Start time: 08/31/2013 11:21 AM Staffing CRNA/Resident: Alfonso Patten J Performed by: resident/CRNA  Preanesthetic Checklist Completed: patient identified, surgical consent, pre-op evaluation, timeout performed, IV checked, risks and benefits discussed and monitors and equipment checked Spinal Block Patient position: sitting Prep: Betadine and site prepped and draped Patient monitoring: heart rate, continuous pulse ox and blood pressure Approach: midline Location: L3-4 Injection technique: single-shot Needle Needle type: Sprotte  Needle gauge: 24 G Needle length: 10 cm Additional Notes Expiration date 01/2015   Lot Number [id number]      Patient denies pain/paresthesia with procedure.  No heme noted.  +CSF return.   2 attempts.  Patient tolerated well.

## 2013-08-31 NOTE — Transfer of Care (Signed)
Immediate Anesthesia Transfer of Care Note  Patient: Mary [name]  Procedure(s) Performed: Procedure(s): RIGHT TOTAL KNEE ARTHROPLASTY (Right)  Patient Location: PACU  Anesthesia Type:Spinal  Level of Consciousness: awake, alert , oriented and patient cooperative  Airway & Oxygen Therapy: Patient Spontanous Breathing and Patient connected to face mask oxygen  Post-op Assessment: Report given to PACU RN, Post -op Vital signs reviewed and stable and SAB level - can wiggle toes bilat.   Denies pain at operative site.  Post vital signs: Reviewed and stable  Complications: No apparent anesthesia complications

## 2013-08-31 NOTE — Interval H&P Note (Signed)
History and Physical Interval Note:  08/31/2013 10:12 AM  Mary [name]  has presented today for surgery, with the diagnosis of RIGHT KNEE OA  The various methods of treatment have been discussed with the patient and family. After consideration of risks, benefits and other options for treatment, the patient has consented to  Procedure(s): RIGHT TOTAL KNEE ARTHROPLASTY (Right) as a surgical intervention .  The patient's history has been reviewed, patient examined, no change in status, stable for surgery.  I have reviewed the patient's chart and labs.  Questions were answered to the patient's satisfaction.     [name]

## 2013-08-31 NOTE — Anesthesia Preprocedure Evaluation (Signed)
Anesthesia Evaluation  Patient identified by MRN, date of birth, ID band Patient awake    Reviewed: Allergy & Precautions, H&P , NPO status , Patient's Chart, lab work & pertinent test results  History of Anesthesia Complications (+) PONV and history of anesthetic complications  Airway Mallampati: II TM Distance: >3 FB Neck ROM: Full    Dental  (+) Dental Advisory Given   Pulmonary former smoker,  breath sounds clear to auscultation        Cardiovascular hypertension, Pt. on medications + Valvular Problems/Murmurs Rhythm:Regular Rate:Normal     Neuro/Psych  Headaches, negative psych ROS   GI/Hepatic Neg liver ROS, GERD-  Medicated,  Endo/Other  negative endocrine ROS  Renal/GU Renal diseasenegative Renal ROS     Musculoskeletal negative musculoskeletal ROS (+)   Abdominal   Peds  Hematology  (+) anemia ,   Anesthesia Other Findings   Reproductive/Obstetrics negative OB ROS                           Anesthesia Physical Anesthesia Plan  ASA: II  Anesthesia Plan:    Post-op Pain Management:    Induction:   Airway Management Planned:   Additional Equipment:   Intra-op Plan:   Post-operative Plan:   Informed Consent: I have reviewed the patients History and Physical, chart, labs and discussed the procedure including the risks, benefits and alternatives for the proposed anesthesia with the patient or authorized representative who has indicated his/her understanding and acceptance.   Dental advisory given  Plan Discussed with: CRNA  Anesthesia Plan Comments:         Anesthesia Quick Evaluation

## 2013-08-31 NOTE — Op Note (Signed)
NAME:  Mary [name]                      MEDICAL RECORD NO.:  [id number]                             FACILITY:  [facility]      PHYSICIAN:  Pietro Cassis. Alvan Dame, M.D.  DATE OF BIRTH:  11-Dec-1950      DATE OF PROCEDURE:  08/31/2013                                     OPERATIVE REPORT         PREOPERATIVE DIAGNOSIS:  Right knee osteoarthritis.      POSTOPERATIVE DIAGNOSIS:  Right knee osteoarthritis.      FINDINGS:  The patient was noted to have complete loss of cartilage and   bone-on-bone arthritis with associated osteophytes in all three compartments of   the knee with a significant synovitis and associated effusion.      PROCEDURE:  Right total knee replacement.      COMPONENTS USED:  DePuy rotating platform posterior stabilized knee   system, a size 3 femur, 3 tibia, 12.5 mm PS insert, and 38 patellar   button.      SURGEON:  Pietro Cassis. Alvan Dame, M.D.      ASSISTANT:  Danae Orleans, PA-C.      ANESTHESIA:  Spinal.      SPECIMENS:  None.      COMPLICATION:  None.      DRAINS:  None.  EBL: <150cc      TOURNIQUET TIME:   Total Tourniquet Time Documented: Thigh (Right) - 36 minutes Total: Thigh (Right) - 36 minutes   The patient was stable to the recovery room.      INDICATION FOR PROCEDURE:  Mary [name] is a 63 y.o. female patient of   mine.  The patient had been seen, evaluated, and treated conservatively in the   office with medication, activity modification, and injections.  The patient had   radiographic changes of bone-on-bone arthritis with endplate sclerosis and osteophytes noted.      The patient failed conservative measures including medication, injections, and activity modification, and at this point was ready for more definitive measures.   Based on the radiographic changes and failed conservative measures, the patient   decided to proceed with total knee replacement.  Risks of infection,   DVT, component failure, need for revision surgery, postop course,  and   expectations were all   discussed and reviewed.  Consent was obtained for benefit of pain   relief.      PROCEDURE IN DETAIL:  The patient was brought to the operative theater.   Once adequate anesthesia, preoperative antibiotics, 2 gm of Ancef administered, the patient was positioned supine with the right thigh tourniquet placed.  The  right lower extremity was prepped and draped in sterile fashion.  A time-   out was performed identifying the patient, planned procedure, and   extremity.      The right lower extremity was placed in the [facility] leg holder.  The leg was   exsanguinated, tourniquet elevated to 250 mmHg.  A midline incision was   made followed by median parapatellar arthrotomy.  Following initial   exposure, attention was first directed to the patella.  Precut  measurement was noted to be 22 mm.  I resected down to 14 mm and used a   38 patellar button to restore patellar height as well as cover the cut   surface.      The lug holes were drilled and a metal shim was placed to protect the   patella from retractors and saw blades.      At this point, attention was now directed to the femur.  The femoral   canal was opened with a drill, irrigated to try to prevent fat emboli.  An   intramedullary rod was passed at 3 degrees valgus, 76mm of bone was   resected off the distal femur.  Following this resection, the tibia was   subluxated anteriorly.  Using the extramedullary guide, 10 mm of bone was resected off   the proximal lateral tibia.  We confirmed the gap would be   stable medially and laterally with a 10 mm insert as well as confirmed   the cut was perpendicular in the coronal plane, checking with an alignment rod.      Once this was done, I sized the femur to be a size 3 in the anterior-   posterior dimension, chose a standard component based on medial and   lateral dimension.  The size 3 rotation block was then pinned in   position anterior referenced using  the C-clamp to set rotation.  The   anterior, posterior, and  chamfer cuts were made without difficulty nor   notching making certain that I was along the anterior cortex to help   with flexion gap stability.      The final box cut was made off the lateral aspect of distal femur.      At this point, the tibia was sized to be a size 3, the size 3 tray was   then pinned in position through the medial third of the tubercle,   drilled, and keel punched.  Trial reduction was now carried with a 3 femur,  3 tibia, a 12.5 mm insert, and the 38 patella botton.  The knee was brought to   extension, full extension with good flexion stability with the patella   tracking through the trochlea without application of pressure.  Given   all these findings, the trial components removed.  Final components were   opened and cement was mixed.  The knee was irrigated with normal saline   solution and pulse lavage.  The synovial lining was   then injected with 20cc of Exparel, 25cc of 0.25% Marcaine with epinephrine and 1 cc of Toradol,   total of 61 cc.      The knee was irrigated.  Final implants were then cemented onto clean and   dried cut surfaces of bone with the knee brought to extension with a 12.5 mm trial insert.      Once the cement had fully cured, the excess cement was removed   throughout the knee.  I confirmed I was satisfied with the range of   motion and stability, and the final 12.5 mm PS insert was chosen.  It was   placed into the knee.      The tourniquet had been let down at 36 minutes.  No significant   hemostasis required.  The   extensor mechanism was then reapproximated using #1 Vicryl and #0 V-lock sutures with the knee   in flexion.  The  remaining wound was closed with 2-0 Vicryl and running  4-0 Monocryl.   The knee was cleaned, dried, dressed sterilely using Dermabond and   Aquacel dressing. The patient was then   brought to recovery room in stable condition, tolerating the  procedure   well.   Please note that Physician Assistant, Danae Orleans, PA-C, was present for the entirety of the case, and was utilized for pre-operative positioning, peri-operative retractor management, general facilitation of the procedure.  He was also utilized for primary wound closure at the end of the case.              Pietro Cassis Alvan Dame, M.D.    08/31/2013 12:59 PM

## 2013-08-31 NOTE — Anesthesia Postprocedure Evaluation (Signed)
Anesthesia Post Note  Patient: Mary [name]  Procedure(s) Performed: Procedure(s) (LRB): RIGHT TOTAL KNEE ARTHROPLASTY (Right)  Anesthesia type: Spinal  Patient location: PACU  Post pain: Pain level controlled  Post assessment: Post-op Vital signs reviewed  Last Vitals: BP 116/60  Pulse 85  Temp(Src) 36.7 C (Oral)  Resp 17  Ht 5\' 5"  (1.651 m)  Wt 148 lb (67.132 kg)  BMI 24.63 kg/m2  SpO2 97%  Post vital signs: Reviewed  Level of consciousness: sedated  Complications: No apparent anesthesia complications

## 2013-09-01 DIAGNOSIS — D500 Iron deficiency anemia secondary to blood loss (chronic): Secondary | ICD-10-CM | POA: Diagnosis not present

## 2013-09-01 LAB — BASIC METABOLIC PANEL
BUN: 19 mg/dL (ref 6–23)
CALCIUM: 8.3 mg/dL — AB (ref 8.4–10.5)
CO2: 23 mEq/L (ref 19–32)
Chloride: 103 mEq/L (ref 96–112)
Creatinine, Ser: 0.86 mg/dL (ref 0.50–1.10)
GFR calc Af Amer: 81 mL/min — ABNORMAL LOW (ref >90)
GFR, EST NON AFRICAN AMERICAN: 70 mL/min — AB (ref >90)
Glucose, Bld: 154 mg/dL — ABNORMAL HIGH (ref 70–99)
POTASSIUM: 5 meq/L (ref 3.7–5.3)
Sodium: 136 mEq/L — ABNORMAL LOW (ref 137–147)

## 2013-09-01 LAB — CBC
HCT: 32.6 % — ABNORMAL LOW (ref 36.0–46.0)
HEMOGLOBIN: 10.6 g/dL — AB (ref 12.0–15.0)
MCH: 29.4 pg (ref 26.0–34.0)
MCHC: 32.5 g/dL (ref 30.0–36.0)
MCV: 90.6 fL (ref 78.0–100.0)
PLATELETS: 199 10*3/uL (ref 150–400)
RBC: 3.6 MIL/uL — ABNORMAL LOW (ref 3.87–5.11)
RDW: 12.6 % (ref 11.5–15.5)
WBC: 11.2 10*3/uL — ABNORMAL HIGH (ref 4.0–10.5)

## 2013-09-01 MED ORDER — HYDROCODONE-ACETAMINOPHEN 5-325 MG PO TABS
1.0000 | ORAL_TABLET | ORAL | Status: DC | PRN
  Administered 2013-09-01 (×2): 1 via ORAL
  Filled 2013-09-01 (×2): qty 1

## 2013-09-01 MED ORDER — FERROUS SULFATE 325 (65 FE) MG PO TABS
325.0000 mg | ORAL_TABLET | Freq: Three times a day (TID) | ORAL | Status: DC
Start: 2013-09-01 — End: 2014-02-20

## 2013-09-01 MED ORDER — POLYETHYLENE GLYCOL 3350 17 G PO PACK: 17.0000 g | PACK | Freq: Two times a day (BID) | ORAL | Status: DC

## 2013-09-01 MED ORDER — ASPIRIN 325 MG PO TBEC: 325.0000 mg | DELAYED_RELEASE_TABLET | Freq: Two times a day (BID) | ORAL | Status: AC

## 2013-09-01 MED ORDER — METHOCARBAMOL 500 MG PO TABS: 500.0000 mg | ORAL_TABLET | Freq: Four times a day (QID) | ORAL | Status: DC | PRN

## 2013-09-01 MED ORDER — DSS 100 MG PO CAPS: 100.0000 mg | ORAL_CAPSULE | Freq: Two times a day (BID) | ORAL | Status: DC

## 2013-09-01 MED ORDER — HYDROCODONE-ACETAMINOPHEN 5-325 MG PO TABS: 0.5000 | ORAL_TABLET | ORAL | Status: DC | PRN

## 2013-09-01 NOTE — Evaluation (Signed)
Physical Therapy Evaluation Patient Details Name: Mary [name] MRN: [id number] DOB: [birth year] Today's Date: 09/01/2013   History of Present Illness  s/p R TKA  Clinical Impression  Pt s/p R TKR presents with decreased R LE strength/ROM and post op pain limiting functional mobility.  Pt should progress well to d.c home with family assist and HHPT follow up    Follow Up Recommendations Home health PT    Equipment Recommendations  None recommended by PT    Recommendations for Other Services OT consult     Precautions / Restrictions Precautions Precautions: Fall;Knee Restrictions Weight Bearing Restrictions: No      Mobility  Bed Mobility                  Transfers Overall transfer level: Needs assistance Equipment used: Rolling walker (2 wheeled) Transfers: Sit to/from Stand Sit to Stand: Min assist         General transfer comment: cues for LE management and use of UEs to self assist  Ambulation/Gait Ambulation/Gait assistance: Min assist Ambulation Distance (Feet): 120 Feet Assistive device: Rolling walker (2 wheeled) Gait Pattern/deviations: Step-to pattern;Decreased step length - right;Decreased step length - left;Shuffle;Trunk flexed     General Gait Details: cues for sequence, posture and position from [employer]            Wheelchair Mobility    Modified Rankin (Stroke Patients Only)       Balance                                             Pertinent Vitals/Pain 5/10; premed, ice packs provided    Home Living Family/patient expects to be discharged to:: Private residence Living Arrangements: Spouse/significant other Available Help at Discharge: Family Type of Home: House Home Access: Stairs to enter Entrance Stairs-Rails: None Entrance Stairs-Number of Steps: 2 Home Layout: Able to live on main level with bedroom/bathroom;Two level Home Equipment: Shower seat;Bedside commode;Walker - 2 wheels       Prior Function Level of Independence: Independent               Hand Dominance        Extremity/Trunk Assessment   Upper Extremity Assessment: Overall WFL for tasks assessed           Lower Extremity Assessment: RLE deficits/detail RLE Deficits / Details: 3/5 quads with AAROM at knee -10 - 70    Cervical / Trunk Assessment: Normal  Communication   Communication: No difficulties  Cognition Arousal/Alertness: Awake/alert Behavior During Therapy: WFL for tasks assessed/performed Overall Cognitive Status: Within Functional Limits for tasks assessed                      General Comments      Exercises Total Joint Exercises Ankle Circles/Pumps: AROM;Both;15 reps;Supine Quad Sets: AROM;Both;10 reps;Supine Heel Slides: AAROM;Right;Supine;15 reps Straight Leg Raises: AAROM;AROM;Right;15 reps;Supine      Assessment/Plan    PT Assessment Patient needs continued PT services  PT Diagnosis Difficulty walking   PT Problem List Decreased strength;Decreased range of motion;Decreased activity tolerance;Decreased mobility;Decreased knowledge of use of DME;Pain  PT Treatment Interventions DME instruction;Gait training;Stair training;Functional mobility training;Therapeutic activities;Therapeutic exercise;Patient/family education   PT Goals (Current goals can be found in the Care Plan section) Acute Rehab PT Goals Patient Stated Goal: Resume previous active lifestyle with decreased pain PT Goal Formulation:  With patient Time For Goal Achievement: 09/08/13 Potential to Achieve Goals: Good    Frequency 7X/week   Barriers to discharge        Co-evaluation               End of Session Equipment Utilized During Treatment: Gait belt Activity Tolerance: Patient tolerated treatment well Patient left: in chair;with call bell/phone within reach;with family/visitor present Nurse Communication: Mobility status         Time: 1030-1058 PT Time Calculation  (min): 28 min   Charges:   PT Evaluation $Initial PT Evaluation Tier I: 1 Procedure PT Treatments $Gait Training: 8-22 mins $Therapeutic Exercise: 8-22 mins   PT G Codes:          Mathis Fare 09/01/2013, 5:30 PM

## 2013-09-01 NOTE — Progress Notes (Signed)
   Subjective: 1 Day Post-Op Procedure(s) (LRB): RIGHT TOTAL KNEE ARTHROPLASTY (Right)   Patient reports pain as mild, pain controlled. No events throughout the night. States that she is ready to be discharged home if she does well with PT.  Objective:   VITALS:   Filed Vitals:   09/01/13  BP: 125/68  Pulse: 78  Temp: 98 F (36.7 C)   Resp: 18    Neurovascular intact Dorsiflexion/Plantar flexion intact Incision: dressing C/[name]/I No cellulitis present Compartment soft  LABS  Recent Labs  09/01/13 0347  HGB 10.6*  HCT 32.6*  WBC 11.2*  PLT 199     Recent Labs  09/01/13 0347  NA 136*  K 5.0  BUN 19  CREATININE 0.86  GLUCOSE 154*     Assessment/Plan: 1 Day Post-Op Procedure(s) (LRB): RIGHT TOTAL KNEE ARTHROPLASTY (Right) Foley cath [name]/c'ed Advance diet Up with therapy [name]/C IV fluids Discharge home with home health Follow up in 2 weeks at [facility]. Follow up with OLIN,Mary [name] in 2 weeks.  Contact information:  [facility] [address], [place] [phone]    Expected ABLA  Treated with iron and will observe         West Pugh. Mary [name]   PAC  09/01/2013, 9:19 AM

## 2013-09-01 NOTE — Progress Notes (Signed)
[facility]   [facility] is providing the following services: Commode (patient declined rw)  If patient discharges after hours, please call [phone].   Linward Headland 09/01/2013, 10:33 AM

## 2013-09-01 NOTE — Progress Notes (Signed)
Physical Therapy Treatment Patient Details Name: Mary [name] MRN: [id number] DOB: [birth year] Today's Date: 09/01/2013    History of Present Illness s/p R TKA    PT Comments      Follow Up Recommendations  Home health PT     Equipment Recommendations  None recommended by PT    Recommendations for Other Services OT consult     Precautions / Restrictions Precautions Precautions: Fall;Knee Restrictions Weight Bearing Restrictions: No    Mobility  Bed Mobility Overal bed mobility: Needs Assistance Bed Mobility: Supine to Sit;Sit to Supine     Supine to sit: Min guard Sit to supine: Min guard   General bed mobility comments: pt self assisting R LE with UEs  Transfers Overall transfer level: Needs assistance Equipment used: Rolling walker (2 wheeled) Transfers: Sit to/from Stand Sit to Stand: Min guard;Supervision         General transfer comment: cues for LE management and use of UEs to self assist  Ambulation/Gait Ambulation/Gait assistance: Min guard;Supervision Ambulation Distance (Feet): 180 Feet Assistive device: Rolling walker (2 wheeled) Gait Pattern/deviations: Step-to pattern;Decreased step length - right;Decreased step length - left;Shuffle;Step-through pattern;Trunk flexed     General Gait Details: cues for sequence, posture and position from RW   Stairs Stairs: Yes Stairs assistance: Min assist Stair Management: No rails;Step to pattern;Backwards;With walker Number of Stairs: 4 General stair comments: cues for sequence and foot/RW placement.  Also reviewed stairs with Bil rails and with single rail and crutch/cane  Wheelchair Mobility    Modified Rankin (Stroke Patients Only)       Balance                                    Cognition Arousal/Alertness: Awake/alert Behavior During Therapy: WFL for tasks assessed/performed Overall Cognitive Status: Within Functional Limits for tasks assessed                       Exercises Total Joint Exercises Ankle Circles/Pumps: AROM;Both;15 reps;Supine Quad Sets: AROM;Both;10 reps;Supine Heel Slides: AAROM;Right;Supine;15 reps Straight Leg Raises: AAROM;AROM;Right;15 reps;Supine    General Comments        Pertinent Vitals/Pain 4/10; premed, ice packs provided    Home Living Family/patient expects to be discharged to:: Private residence Living Arrangements: Spouse/significant other Available Help at Discharge: Family Type of Home: House Home Access: Stairs to enter Entrance Stairs-Rails: None Home Layout: Able to live on main level with bedroom/bathroom;Two level Home Equipment: Shower seat;Bedside commode;Walker - 2 wheels      Prior Function Level of Independence: Independent          PT Goals (current goals can now be found in the care plan section) Acute Rehab PT Goals Patient Stated Goal: Resume previous active lifestyle with decreased pain PT Goal Formulation: With patient Time For Goal Achievement: 09/08/13 Potential to Achieve Goals: Good Progress towards PT goals: Progressing toward goals    Frequency  7X/week    PT Plan Current plan remains appropriate    Co-evaluation             End of Session Equipment Utilized During Treatment: Gait belt Activity Tolerance: Patient tolerated treatment well Patient left: in chair;with call bell/phone within reach;with family/visitor present     Time: 0623-7628 PT Time Calculation (min): 20 min  Charges:  $Gait Training: 8-22 mins $Therapeutic Exercise: 8-22 mins  G Codes:      Mathis Fare 09-18-2013, 5:37 PM

## 2013-09-01 NOTE — Evaluation (Signed)
Occupational Therapy Evaluation Patient Details Name: Mary [name] MRN: [id number] DOB: March 27, 1951 Today's Date: 09/01/2013    History of Present Illness s/p R TKA   Clinical Impression   63 year old female was admitted for the above surgery.  All education was completed.  No further OT is needed at this time.      Follow Up Recommendations  No OT follow up    Equipment Recommendations    3:1 was delivered to room   Recommendations for Other Services       Precautions / Restrictions Precautions Precautions: Fall;Knee Restrictions Weight Bearing Restrictions: No      Mobility Bed Mobility Overal bed mobility: Needs Assistance Bed Mobility: Sit to Supine       Sit to supine: Min assist   General bed mobility comments: assist for R LE  Transfers Overall transfer level: Needs assistance Equipment used: Rolling walker (2 wheeled) Transfers: Sit to/from Stand Sit to Stand: Min guard         General transfer comment: from 3:1; cues for UE/LE placement    Balance                                            ADL Overall ADL's : Needs assistance/impaired     Grooming: Supervision/safety;Wash/dry hands;Standing   Upper Body Bathing: Set up;Sitting   Lower Body Bathing: Minimal assistance;Sit to/from stand (with reacher)   Upper Body Dressing : Set up;Sitting   Lower Body Dressing: Minimal assistance;Sit to/from stand (with Secondary school teacher)   Toilet Transfer: Min guard;BSC;Ambulation   Toileting- Water quality scientist and Hygiene: Min guard;Sit to/from stand   Tub/ Shower Transfer: Min guard;Walk-in shower   Functional mobility during ADLs: Min guard;Rolling walker General ADL Comments: pt has reacher from family member.  Husband will also assist with adls as needed     Vision                     Perception     Praxis      Pertinent Vitals/Pain R knee sore; repositioned and ice applied     Hand Dominance      Extremity/Trunk Assessment Upper Extremity Assessment Upper Extremity Assessment: Overall WFL for tasks assessed           Communication Communication Communication: No difficulties   Cognition Arousal/Alertness: Awake/alert Behavior During Therapy: WFL for tasks assessed/performed Overall Cognitive Status: Within Functional Limits for tasks assessed                     General Comments       Exercises       Shoulder Instructions      Home Living Family/patient expects to be discharged to:: Private residence Living Arrangements: Spouse/significant other                 Bathroom Shower/Tub: Occupational psychologist: Standard     Home Equipment: Shower seat;Bedside commode Management consultant)          Prior Functioning/Environment Level of Independence: Independent             OT Diagnosis:     OT Problem List:     OT Treatment/Interventions:      OT Goals(Current goals can be found in the care plan section)    OT Frequency:     Barriers to D/C:  Co-evaluation              End of Session    Activity Tolerance: Patient tolerated treatment well Patient left: in bed;with call bell/phone within reach;with family/visitor present   Time: 9166-0600 OT Time Calculation (min): 21 min Charges:  OT General Charges $OT Visit: 1 Procedure OT Evaluation $Initial OT Evaluation Tier I: 1 Procedure OT Treatments $Self Care/Home Management : 8-22 mins G-Codes:    Lesle Chris Sep 14, 2013, 2:48 PM Lesle Chris, OTR/L [phone] Sep 14, 2013

## 2013-09-02 NOTE — Progress Notes (Signed)
Discharge summary sent to payer through [facility]  

## 2013-09-02 NOTE — Discharge Summary (Signed)
Physician Discharge Summary  Patient ID: Mary [name] MRN: [id number] DOB/AGE: 06/06/1950 63 y.o.  Admit date: 08/31/2013 Discharge date: 09/01/2013   Procedures:  Procedure(s) (LRB): RIGHT TOTAL KNEE ARTHROPLASTY (Right)  Attending Physician:  Dr. [name]   Admission Diagnoses:   Right knee OA / pain  Discharge Diagnoses:  Principal Problem:   S/P right TKA Active Problems:   DJD (degenerative joint disease) of knee   Expected blood loss anemia  Past Medical History  Diagnosis Date  . Hypertension   . Hyperlipidemia   . GERD (gastroesophageal reflux disease)   . Complication of anesthesia     patient states does not take much medicine to put her to sleep   . PONV (postoperative nausea and vomiting)   . Heart murmur   . Mitral valve prolapse     no symptoms   . Edema     ankles to knees   . Chronic kidney disease     idiopathic micro hematuria   . Headache(784.0)     hx of migraines   . Arthritis   . Anemia     HPI: Mary [name], 63 y.o. female, has a history of pain and functional disability in the right knee due to arthritis and has failed non-surgical conservative treatments for greater than 12 weeks to includeNSAID's and/or analgesics, corticosteriod injections, viscosupplementation injections and activity modification. Onset of symptoms was gradual, starting 6-7 years ago with gradually worsening course since that time. The patient noted no past surgery on the right knee(s). Patient currently rates pain in the right knee(s) at 8 out of 10 with activity. Patient has night pain, worsening of pain with activity and weight bearing, pain that interferes with activities of daily living, pain with passive range of motion, crepitus and joint swelling. Patient has evidence of periarticular osteophytes and joint space narrowing by imaging studies. There is no active infection. Risks, benefits and expectations were discussed with the patient. Risks including but not  limited to the risk of anesthesia, blood clots, nerve damage, blood vessel damage, failure of the prosthesis, infection and up to and including death. Patient understand the risks, benefits and expectations and wishes to proceed with surgery.   PCP: Glo Herring., MD   Discharged Condition: good  Hospital Course:  Patient underwent the above stated procedure on 08/31/2013. Patient tolerated the procedure well and brought to the recovery room in good condition and subsequently to the floor.  POD #1 BP: 125/68 ; Pulse: 78 ; Temp: 98 F (36.7 C) ; Resp: 18 Patient reports pain as mild, pain controlled. No events throughout the night. States that she is ready to be discharged home. Neurovascular intact, dorsiflexion/plantar flexion intact, incision: dressing C/D/I, no cellulitis present and compartment soft.   LABS  Basename    HGB  10.6  HCT  32.6    Discharge Exam: General appearance: alert, cooperative and no distress Extremities: Homans sign is negative, no sign of DVT, no edema, redness or tenderness in the calves or thighs and no ulcers, gangrene or trophic changes  Disposition:    Home with follow up in 2 weeks   Follow-up Information   Follow up with [name], MD. Schedule an appointment as soon as possible for a visit in 2 weeks.   Specialty:  Orthopedic Surgery   Contact information:   [address] [place] [address] [phone]       Discharge Orders   Future Orders Complete By Expires  Call MD / Call 911  As directed    Change dressing  As directed    Constipation Prevention  As directed    Diet - low sodium heart healthy  As directed    Discharge instructions  As directed    Driving restrictions  As directed    Increase activity slowly as tolerated  As directed    TED hose  As directed    Weight bearing as tolerated  As directed    Questions:     Laterality:     Extremity:          Medication List    STOP taking these  medications       acetaminophen 500 MG tablet  Commonly known as:  TYLENOL     ibuprofen 200 MG tablet  Commonly known as:  ADVIL,MOTRIN      TAKE these medications       amLODipine 10 MG tablet  Commonly known as:  NORVASC  Take 10 mg by mouth every morning.     aspirin 325 MG EC tablet  Take 1 tablet (325 mg total) by mouth 2 (two) times daily.     benazepril 10 MG tablet  Commonly known as:  LOTENSIN  Take 10 mg by mouth every morning.     cholecalciferol 400 UNITS Tabs tablet  Commonly known as:  VITAMIN D  Take 400 Units by mouth daily.     DSS 100 MG Caps  Take 100 mg by mouth 2 (two) times daily.     ferrous sulfate 325 (65 FE) MG tablet  Take 1 tablet (325 mg total) by mouth 3 (three) times daily after meals.     fexofenadine 180 MG tablet  Commonly known as:  ALLEGRA  Take 180 mg by mouth daily.     Fish Oil 1200 MG Caps  Take 1 capsule by mouth 2 (two) times daily.     Glucosamine-Chondroitin 750-600 MG Tabs  Take 1 tablet by mouth 2 (two) times daily.     GRAPE SEED EXTRACT PO  Take 200 mg by mouth daily.     HM VITAMIN B COMPLEX/VITAMIN C PO  Take 1 tablet by mouth daily.     HYDROcodone-acetaminophen 5-325 MG per tablet  Commonly known as:  NORCO  Take 0.5-2 tablets by mouth every 4 (four) hours as needed for moderate pain.     IMITREX 100 MG tablet  Generic drug:  SUMAtriptan  Take 100 mg by mouth as needed for migraine or headache. May repeat in 2 hours if headache persists or recurs.     Magnesium 250 MG Tabs  Take 1 tablet by mouth daily.     Melatonin 3 MG Tabs  Take by mouth daily.     methocarbamol 500 MG tablet  Commonly known as:  ROBAXIN  Take 1 tablet (500 mg total) by mouth every 6 (six) hours as needed for muscle spasms.     multivitamin with minerals Tabs tablet  Take 1 tablet by mouth daily.     OVER THE COUNTER MEDICATION  Tart cherry juice extract 425 mg - patient takes 2 times daily     oyster calcium 500 MG Tabs  tablet  Take 500 mg of elemental calcium by mouth 4 (four) times daily.     polyethylene glycol packet  Commonly known as:  MIRALAX / GLYCOLAX  Take 17 g by mouth 2 (two) times daily.     pyridOXINE 100 MG tablet  Commonly known as:  VITAMIN B-6  Take 100 mg by mouth daily.     rosuvastatin 40 MG tablet  Commonly known as:  CRESTOR  Take 1 tablet (40 mg total) by mouth daily.     THERA TEARS ALLERGY OP  Apply 1 drop to eye as needed (Dry eyes).         Signed: West Pugh. Urian Martenson   PAC  09/02/2013, 6:04 PM

## 2013-12-15 ENCOUNTER — Other Ambulatory Visit: Payer: Self-pay | Admitting: Cardiovascular Disease

## 2013-12-15 NOTE — Telephone Encounter (Signed)
Rx refill sent to patient pharmacy   

## 2013-12-22 ENCOUNTER — Other Ambulatory Visit (HOSPITAL_COMMUNITY): Payer: Self-pay | Admitting: Internal Medicine

## 2013-12-22 DIAGNOSIS — Z139 Encounter for screening, unspecified: Secondary | ICD-10-CM

## 2014-01-20 ENCOUNTER — Ambulatory Visit (HOSPITAL_COMMUNITY): Payer: BC Managed Care – PPO

## 2014-02-02 ENCOUNTER — Ambulatory Visit (HOSPITAL_COMMUNITY)
Admission: RE | Admit: 2014-02-02 | Discharge: 2014-02-02 | Disposition: A | Discharge status: Home / Self Care | Payer: BC Managed Care – PPO | Source: Ambulatory Visit | Attending: Internal Medicine | Admitting: Internal Medicine

## 2014-02-02 DIAGNOSIS — Z139 Encounter for screening, unspecified: Secondary | ICD-10-CM

## 2014-02-02 DIAGNOSIS — Z1231 Encounter for screening mammogram for malignant neoplasm of breast: Secondary | ICD-10-CM | POA: Insufficient documentation

## 2014-02-20 ENCOUNTER — Encounter (HOSPITAL_COMMUNITY): Payer: Self-pay | Admitting: Emergency Medicine

## 2014-02-20 ENCOUNTER — Emergency Department (HOSPITAL_COMMUNITY)
Admission: EM | Admit: 2014-02-20 | Discharge: 2014-02-20 | Disposition: A | Discharge status: Home / Self Care | Payer: Medicare Other | Attending: Emergency Medicine | Admitting: Emergency Medicine

## 2014-02-20 ENCOUNTER — Emergency Department (HOSPITAL_COMMUNITY): Payer: Medicare Other

## 2014-02-20 DIAGNOSIS — Z87891 Personal history of nicotine dependence: Secondary | ICD-10-CM | POA: Insufficient documentation

## 2014-02-20 DIAGNOSIS — E785 Hyperlipidemia, unspecified: Secondary | ICD-10-CM | POA: Diagnosis not present

## 2014-02-20 DIAGNOSIS — R011 Cardiac murmur, unspecified: Secondary | ICD-10-CM | POA: Insufficient documentation

## 2014-02-20 DIAGNOSIS — S61432A Puncture wound without foreign body of left hand, initial encounter: Secondary | ICD-10-CM | POA: Diagnosis not present

## 2014-02-20 DIAGNOSIS — Z23 Encounter for immunization: Secondary | ICD-10-CM | POA: Diagnosis not present

## 2014-02-20 DIAGNOSIS — Z79899 Other long term (current) drug therapy: Secondary | ICD-10-CM | POA: Insufficient documentation

## 2014-02-20 DIAGNOSIS — N189 Chronic kidney disease, unspecified: Secondary | ICD-10-CM | POA: Diagnosis not present

## 2014-02-20 DIAGNOSIS — I129 Hypertensive chronic kidney disease with stage 1 through stage 4 chronic kidney disease, or unspecified chronic kidney disease: Secondary | ICD-10-CM | POA: Diagnosis not present

## 2014-02-20 DIAGNOSIS — Y9289 Other specified places as the place of occurrence of the external cause: Secondary | ICD-10-CM | POA: Diagnosis not present

## 2014-02-20 DIAGNOSIS — R51 Headache: Secondary | ICD-10-CM | POA: Diagnosis not present

## 2014-02-20 DIAGNOSIS — Z8719 Personal history of other diseases of the digestive system: Secondary | ICD-10-CM | POA: Insufficient documentation

## 2014-02-20 DIAGNOSIS — W458XXA Other foreign body or object entering through skin, initial encounter: Secondary | ICD-10-CM | POA: Insufficient documentation

## 2014-02-20 DIAGNOSIS — Y9389 Activity, other specified: Secondary | ICD-10-CM | POA: Diagnosis not present

## 2014-02-20 DIAGNOSIS — S6992XA Unspecified injury of left wrist, hand and finger(s), initial encounter: Secondary | ICD-10-CM | POA: Diagnosis present

## 2014-02-20 DIAGNOSIS — Z862 Personal history of diseases of the blood and blood-forming organs and certain disorders involving the immune mechanism: Secondary | ICD-10-CM | POA: Insufficient documentation

## 2014-02-20 DIAGNOSIS — Z8739 Personal history of other diseases of the musculoskeletal system and connective tissue: Secondary | ICD-10-CM | POA: Insufficient documentation

## 2014-02-20 MED ORDER — DOXYCYCLINE HYCLATE 100 MG PO TABS
100.0000 mg | ORAL_TABLET | Freq: Once | ORAL | Status: AC
  Administered 2014-02-20: 100 mg via ORAL
  Filled 2014-02-20: qty 1

## 2014-02-20 MED ORDER — TETANUS-DIPHTH-ACELL PERTUSSIS 5-2.5-18.5 LF-MCG/0.5 IM SUSP
0.5000 mL | Freq: Once | INTRAMUSCULAR | Status: AC
  Administered 2014-02-20: 0.5 mL via INTRAMUSCULAR
  Filled 2014-02-20: qty 0.5

## 2014-02-20 MED ORDER — DOXYCYCLINE HYCLATE 100 MG PO CAPS: 100.0000 mg | ORAL_CAPSULE | Freq: Two times a day (BID) | ORAL | Status: DC

## 2014-02-20 NOTE — ED Notes (Signed)
Discharge instructions given, pt demonstrated teach back and verbal understanding. No concerns voiced.  

## 2014-02-20 NOTE — ED Notes (Signed)
Pt reports accidentally poked herself with a cooking nail. No active bleeding noted. Full ROM and CNS intact. Strong radial pulses. nad noted.

## 2014-02-20 NOTE — Discharge Instructions (Signed)
The x-ray of your hand is negative for any fracture, dislocation, or foreign body. Please use doxycycline 2 times daily with food. Please see Dr. Gerarda Fraction or return to the emergency department if any signs of infection. Please cleanse the wound daily with soap and water and apply a Band-Aid. Please Elta [place] your medical records that your tetanus was updated today. Puncture Wound A puncture wound happens when a sharp object pokes a hole in the skin. A puncture wound can cause an infection because germs can go under the skin during the injury. HOME CARE   Change your bandage (dressing) once a day, or as told by your doctor. If the bandage sticks, soak it in water.  Keep all doctor visits as told.  Only take medicine as told by your doctor.  Take your medicine (antibiotics) as told. Finish them even if you start to feel better. You may need a tetanus shot if:  You cannot remember when you had your last tetanus shot.  You have never had a tetanus shot. If you need a tetanus shot and you choose not to have one, you may get tetanus. Sickness from tetanus can be serious. You may need a rabies shot if an animal bite caused your wound. GET HELP RIGHT AWAY IF:   Your wound is red, puffy (swollen), or painful.  You see red lines on the skin near the wound.  You have a bad smell coming from the wound or bandage.  You have yellowish-white fluid (pus) coming from the wound.  Your medicine is not working.  You notice an object in the wound.  You have a fever.  You have severe pain.  You have trouble breathing.  You feel dizzy or pass out (faint).  You keep throwing up (vomiting).  You lose feeling (numbness) in your arm or leg, or you cannot move your arm or leg.  Your problems get worse. MAKE SURE YOU:   Understand these instructions.  Will watch your condition.  Will get help right away if you are not doing well or get worse. Document Released: 02/06/2008 Document Revised: 07/22/2011  Document Reviewed: 10/16/2010 [facility] Patient Information 2015 [place], [place]. This information is not intended to replace advice given to you by your health care provider. Make sure you discuss any questions you have with your health care provider.

## 2014-02-20 NOTE — ED Provider Notes (Signed)
CSN: [id number]     Arrival date & time 02/20/14  1750 History  This chart was scribed for Lily Kocher, PA-C, working with Veryl Speak, MD by Steva Colder, ED Scribe. The patient was seen in room APFT22/APFT22 at 7:55 PM.     Chief Complaint  Patient presents with  . Hand Injury    The history is provided by the patient. No language interpreter was used.   Mary [name] is a 63 y.o. female who presents today complaining of hand injury. She states that she was going to bake a pie. She states that she was using aluminum cooking nail. She states that the nail went at an angle in deep. She states that she is left handed. She states that she accidentally poked herself in the hand with a cooking nail. She denies any symptoms. She denies being on any blood thinners. She states that she has not had a surgery to the area. She states that her last tetanus shot was 30 years ago.    Past Medical History  Diagnosis Date  . Hypertension   . Hyperlipidemia   . GERD (gastroesophageal reflux disease)   . Complication of anesthesia     patient states does not take much medicine to put her to sleep   . PONV (postoperative nausea and vomiting)   . Heart murmur   . Mitral valve prolapse     no symptoms   . Edema     ankles to knees   . Chronic kidney disease     idiopathic micro hematuria   . Headache(784.0)     hx of migraines   . Arthritis   . Anemia    Past Surgical History  Procedure Laterality Date  . Veins stripping       bilateral legs   . Tubal ligation  1986  . Cervical fusion  2006    cervical 5-6   . Total knee arthroplasty Right 08/31/2013    Procedure: RIGHT TOTAL KNEE ARTHROPLASTY;  Surgeon: [name], MD;  Location: [facility];  Service: Orthopedics;  Laterality: Right;   Family History  Problem Relation Age of Onset  . Dementia Mother   . Heart attack Father   . Heart disease Father   . Kidney failure Father   . Heart attack Brother     age 25  . Cancer - Other  Brother     kidney   History  Substance Use Topics  . Smoking status: Former Smoker    Types: Cigarettes    Quit date: 05/13/1988  . Smokeless tobacco: Never Used  . Alcohol Use: Yes     Comment: wine- rarely.   OB History   Grav Para Term Preterm Abortions TAB SAB Ect Mult Living                 Review of Systems  Constitutional: Negative for activity change.       All ROS Neg except as noted in HPI  Eyes: Negative for photophobia and discharge.  Respiratory: Negative for cough, shortness of breath and wheezing.   Cardiovascular: Negative for chest pain and palpitations.  Gastrointestinal: Negative for abdominal pain and blood in stool.  Genitourinary: Negative for dysuria, frequency and hematuria.  Musculoskeletal: Positive for arthralgias. Negative for back pain and neck pain.  Skin: Positive for wound (left hand).  Neurological: Positive for headaches. Negative for dizziness, seizures and speech difficulty.  Psychiatric/Behavioral: Negative for hallucinations and confusion.  All other systems reviewed and  are negative.     Allergies  Sporanox  Home Medications   Prior to Admission medications   Medication Sig Start Date End Date Taking? Authorizing Provider  amLODipine (NORVASC) 10 MG tablet TAKE ONE TABLET BY MOUTH ONCE DAILY. 12/15/13   Troy Sine, MD  B Complex-C-Folic Acid (HM VITAMIN B COMPLEX/VITAMIN C PO) Take 1 tablet by mouth daily.    Historical Provider, MD  benazepril (LOTENSIN) 10 MG tablet TAKE ONE TABLET BY MOUTH ONCE DAILY. 12/15/13   Troy Sine, MD  cholecalciferol (VITAMIN D) 400 UNITS TABS tablet Take 400 Units by mouth daily.    Historical Provider, MD  docusate sodium 100 MG CAPS Take 100 mg by mouth 2 (two) times daily. 09/01/13   Lucille Passy Babish, PA-C  ferrous sulfate 325 (65 FE) MG tablet Take 1 tablet (325 mg total) by mouth 3 (three) times daily after meals. 09/01/13   Lucille Passy Babish, PA-C  fexofenadine (ALLEGRA) 180 MG tablet  Take 180 mg by mouth daily.    Historical Provider, MD  Glucosamine-Chondroitin 750-600 MG TABS Take 1 tablet by mouth 2 (two) times daily.     Historical Provider, MD  GRAPE SEED EXTRACT PO Take 200 mg by mouth daily.     Historical Provider, MD  HYDROcodone-acetaminophen (NORCO) 5-325 MG per tablet Take 0.5-2 tablets by mouth every 4 (four) hours as needed for moderate pain. 09/01/13   Lucille Passy Babish, PA-C  Ketotifen Fumarate (THERA TEARS ALLERGY OP) Apply 1 drop to eye as needed (Dry eyes).    Historical Provider, MD  Magnesium 250 MG TABS Take 1 tablet by mouth daily.    Historical Provider, MD  Melatonin 3 MG TABS Take by mouth daily.    Historical Provider, MD  methocarbamol (ROBAXIN) 500 MG tablet Take 1 tablet (500 mg total) by mouth every 6 (six) hours as needed for muscle spasms. 09/01/13   Lucille Passy Babish, PA-C  Multiple Vitamin (MULTIVITAMIN WITH MINERALS) TABS tablet Take 1 tablet by mouth daily.    Historical Provider, MD  Omega-3 Fatty Acids (FISH OIL) 1200 MG CAPS Take 1 capsule by mouth 2 (two) times daily.    Historical Provider, MD  OVER THE COUNTER MEDICATION Tart cherry juice extract 425 mg - patient takes 2 times daily    Historical Provider, MD  Loma Boston (OYSTER CALCIUM) 500 MG TABS tablet Take 500 mg of elemental calcium by mouth 4 (four) times daily.    Historical Provider, MD  polyethylene glycol (MIRALAX / GLYCOLAX) packet Take 17 g by mouth 2 (two) times daily. 09/01/13   Lucille Passy Babish, PA-C  pyridOXINE (VITAMIN B-6) 100 MG tablet Take 100 mg by mouth daily.    Historical Provider, MD  rosuvastatin (CRESTOR) 40 MG tablet Take 1 tablet (40 mg total) by mouth daily. 07/20/13   Troy Sine, MD  SUMAtriptan (IMITREX) 100 MG tablet Take 100 mg by mouth as needed for migraine or headache. May repeat in 2 hours if headache persists or recurs.    Historical Provider, MD   BP 137/61  Pulse 72  Temp(Src) 98.4 F (36.9 C) (Oral)  Resp 18  Ht 5\' 5"  (1.651 m)   Wt 136 lb (61.689 kg)  BMI 22.63 kg/m2  SpO2 100%  Physical Exam  Nursing note and vitals reviewed. Constitutional: She is oriented to person, place, and time. She appears well-developed and well-nourished. No distress.  HENT:  Head: Normocephalic and atraumatic.  Eyes: EOM are normal.  Neck: Neck  supple. No tracheal deviation present.  Cardiovascular: Normal rate.   Pulses:      Radial pulses are 2+ on the left side.  Pulmonary/Chest: Effort normal. No respiratory distress.  Musculoskeletal: Normal range of motion.  Full ROM of the left hand. Capillary refill less than two seconds. Puncture wound of the palmar surface between the third and fourth metacarpals. No exit wound. No hematoma noted of the left hand. Full ROM of the left shoulder elbow and wrist.   Neurological: She is alert and oriented to person, place, and time.  Skin: Skin is warm and dry.  Psychiatric: She has a normal mood and affect. Her behavior is normal.    ED Course  Procedures (including critical care time) DIAGNOSTIC STUDIES: Oxygen Saturation is 100% on room air, normal by my interpretation.    COORDINATION OF CARE: 8:00 PM-Discussed treatment plan which includes Tetanus, Septra, bandage, return if any signs of infection with pt at bedside and pt agreed to plan.   Labs Review Labs Reviewed - No data to display  Imaging Review Dg Hand Complete Left  02/20/2014   CLINICAL DATA:  Laceration to the palm of left hand today.  EXAM: LEFT HAND - COMPLETE 3+ VIEW  COMPARISON:  None.  FINDINGS: There is no evidence of fracture or dislocation. There are degenerative joint changes with narrowed joint space and osteophyte formation of the distal interphalangeal joints and of the first carpal metacarpal joint. There is no radiopaque foreign body.  IMPRESSION: No acute fracture or dislocation.  No radiopaque foreign body.   Electronically Signed   By: Abelardo Diesel M.D.   On: 02/20/2014 19:02     EKG  Interpretation None      MDM  X-ray of the right hand is negative for any fracture or foreign body. There no neurovascular changes noted on the examination. The patient will be treated with doxycycline. Patient is asked to cleanse the wound daily with soap and water, and to see her primary physician or return to the emergency department if any changes, problems, or concerns.    Final diagnoses:  None    **I have reviewed nursing notes, vital signs, and all appropriate lab and imaging results for this patient.  I personally performed the services described in this documentation, which was scribed in my presence. The recorded information has been reviewed and is accurate.    Lenox Ahr, PA-C 02/21/14 1536

## 2014-02-22 NOTE — ED Provider Notes (Signed)
Medical screening examination/treatment/procedure(s) were performed by non-physician practitioner and as supervising physician I was immediately available for consultation/collaboration.     Veryl Speak, MD 02/22/14 0730

## 2014-03-08 ENCOUNTER — Telehealth: Payer: Self-pay | Admitting: Cardiovascular Disease

## 2014-03-08 DIAGNOSIS — I10 Essential (primary) hypertension: Secondary | ICD-10-CM

## 2014-03-08 DIAGNOSIS — E785 Hyperlipidemia, unspecified: Secondary | ICD-10-CM

## 2014-03-08 NOTE — Telephone Encounter (Signed)
Spoke with pt, address confirmed and lab orders placed in the mail to the patient

## 2014-03-08 NOTE — Telephone Encounter (Signed)
Pt called in stating that she usually has labs done prior to coming to her appt...she would those orders mailed to her. Please call  Thanks

## 2014-03-10 ENCOUNTER — Other Ambulatory Visit (HOSPITAL_COMMUNITY): Payer: Self-pay | Admitting: Internal Medicine

## 2014-03-10 DIAGNOSIS — R519 Headache, unspecified: Secondary | ICD-10-CM

## 2014-03-10 DIAGNOSIS — R51 Headache: Principal | ICD-10-CM

## 2014-03-11 ENCOUNTER — Other Ambulatory Visit (HOSPITAL_COMMUNITY): Payer: Self-pay | Admitting: Internal Medicine

## 2014-03-11 DIAGNOSIS — M810 Age-related osteoporosis without current pathological fracture: Secondary | ICD-10-CM

## 2014-03-15 ENCOUNTER — Ambulatory Visit (HOSPITAL_COMMUNITY)
Admission: RE | Admit: 2014-03-15 | Discharge: 2014-03-15 | Disposition: A | Discharge status: Home / Self Care | Payer: Medicare Other | Source: Ambulatory Visit | Attending: Internal Medicine | Admitting: Internal Medicine

## 2014-03-15 DIAGNOSIS — R519 Headache, unspecified: Secondary | ICD-10-CM

## 2014-03-15 DIAGNOSIS — R51 Headache: Secondary | ICD-10-CM | POA: Insufficient documentation

## 2014-03-15 DIAGNOSIS — M810 Age-related osteoporosis without current pathological fracture: Secondary | ICD-10-CM | POA: Diagnosis not present

## 2014-03-15 DIAGNOSIS — R439 Unspecified disturbances of smell and taste: Secondary | ICD-10-CM | POA: Diagnosis not present

## 2014-05-10 LAB — COMPREHENSIVE METABOLIC PANEL
ALBUMIN: 4.1 g/dL (ref 3.5–5.2)
ALT: 17 U/L (ref 0–35)
AST: 19 U/L (ref 0–37)
Alkaline Phosphatase: 69 U/L (ref 39–117)
BUN: 16 mg/dL (ref 6–23)
CALCIUM: 9.6 mg/dL (ref 8.4–10.5)
CO2: 29 meq/L (ref 19–32)
Chloride: 106 mEq/L (ref 96–112)
Creat: 0.95 mg/dL (ref 0.50–1.10)
GLUCOSE: 94 mg/dL (ref 70–99)
POTASSIUM: 4.3 meq/L (ref 3.5–5.3)
Sodium: 142 mEq/L (ref 135–145)
Total Bilirubin: 0.4 mg/dL (ref 0.2–1.2)
Total Protein: 6.9 g/dL (ref 6.0–8.3)

## 2014-05-10 LAB — CBC
HEMATOCRIT: 39.1 % (ref 36.0–46.0)
HEMOGLOBIN: 12.7 g/dL (ref 12.0–15.0)
MCH: 28.8 pg (ref 26.0–34.0)
MCHC: 32.5 g/dL (ref 30.0–36.0)
MCV: 88.7 fL (ref 78.0–100.0)
Platelets: 282 10*3/uL (ref 150–400)
RBC: 4.41 MIL/uL (ref 3.87–5.11)
RDW: 13.2 % (ref 11.5–15.5)
WBC: 5.2 10*3/uL (ref 4.0–10.5)

## 2014-05-10 LAB — LIPID PANEL
CHOL/HDL RATIO: 2.6 ratio
Cholesterol: 184 mg/dL (ref 0–200)
HDL: 72 mg/dL (ref >39)
LDL Cholesterol: 101 mg/dL — ABNORMAL HIGH (ref 0–99)
TRIGLYCERIDES: 54 mg/dL (ref <150)
VLDL: 11 mg/dL (ref 0–40)

## 2014-05-10 LAB — TSH: TSH: 2.009 u[IU]/mL (ref 0.350–4.500)

## 2014-05-12 ENCOUNTER — Telehealth: Payer: Self-pay | Admitting: *Deleted

## 2014-05-12 NOTE — Telephone Encounter (Signed)
-----   Message from Troy Sine, MD sent at 05/11/2014  4:18 PM EST ----- Labs good

## 2014-05-12 NOTE — Telephone Encounter (Signed)
Spoke to patient.lab result given. Continue with current medications . Verbalized understanding Has an appointment 05/23/14

## 2014-05-20 ENCOUNTER — Encounter: Payer: Self-pay | Admitting: *Deleted

## 2014-05-23 ENCOUNTER — Encounter: Payer: Self-pay | Admitting: Cardiovascular Disease

## 2014-05-23 ENCOUNTER — Ambulatory Visit (INDEPENDENT_AMBULATORY_CARE_PROVIDER_SITE_OTHER): Payer: 59 | Admitting: Cardiovascular Disease

## 2014-05-23 VITALS — BP 120/60 | HR 69 | Ht 65.0 in | Wt 144.5 lb

## 2014-05-23 DIAGNOSIS — E785 Hyperlipidemia, unspecified: Secondary | ICD-10-CM

## 2014-05-23 DIAGNOSIS — I10 Essential (primary) hypertension: Secondary | ICD-10-CM

## 2014-05-23 DIAGNOSIS — R002 Palpitations: Secondary | ICD-10-CM

## 2014-05-23 MED ORDER — METOPROLOL SUCCINATE ER 25 MG PO TB24: 12.5000 mg | ORAL_TABLET | Freq: Every day | ORAL | Status: DC

## 2014-05-23 MED ORDER — ROSUVASTATIN CALCIUM 40 MG PO TABS: 40.0000 mg | ORAL_TABLET | Freq: Every day | ORAL | Status: DC

## 2014-05-23 MED ORDER — AMLODIPINE BESYLATE 10 MG PO TABS: 10.0000 mg | ORAL_TABLET | Freq: Every day | ORAL | Status: DC

## 2014-05-23 MED ORDER — BENAZEPRIL HCL 10 MG PO TABS: 10.0000 mg | ORAL_TABLET | Freq: Every day | ORAL | Status: DC

## 2014-05-23 NOTE — Patient Instructions (Signed)
Your physician has recommended you make the following change in your medication: start new metoprolol succ prescription. This has already been sent to your pharmacy.  Your physician wants you to follow-up in: 6 months or sooner if needed with Dr. Claiborne Billings. You will receive a reminder letter in the mail two months in advance. If you don't receive a letter, please call our office to schedule the follow-up appointment.

## 2014-05-23 NOTE — Progress Notes (Signed)
Patient ID: Mary [name], female   DOB: July 04, 1950, 64 y.o.   MRN: [id number]     HPI: Mary [name] is a 64 y.o. female presents to the office for a one year cardiology evaluation.  [name] has a long-standing history of migraine headaches, history of hypertension, hyperlipidemia, GERD, and has strong family history for both coronary as well as peripheral vascular disease. She also has PVD with lower extremity venous abnormalities. Remotely, she had been on pravastatin for hyperlipidemia but this was switched to Crestor do to inadequate response to pravastatin.   Over the past year, she has felt well on her current medical regimen which consists of amlodipine 10 mg benazepril 10 mg for hypetension as well as her migraine headaches and Crestor 40 mg for hyperlipidemia.  She rarely takes  Imitrex for migraine headaches but takes these typically one to 2 times per year.  Over the past year, she underwent right knee replacement surgery by Dr. Alvan Dame.  Heart approximate 4/2 months of rehabilitation.  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  Complete set of blood work was recently done several weeks ago including a competent metabolic panel, CBC, lipid studies, TSH.  Her total cholesterol was 184.  HDL had risen to 72, LDL cholesterol was 101 100 current dose of Crestor.  She denies chest pain. She denies shortness of breath.  She admits to occasional episodes of palpitations which occur approximately 2-3 times per month and last for minutes.  She notes that her pulse races and then it stops.  Her pulse to know the exact rate.  She is unaware if the rate is irregular.  It is not associated with any chest pain or lightheadedness.   Past Medical History  Diagnosis Date  . Hypertension   . Hyperlipidemia   . GERD (gastroesophageal reflux disease)   . Complication of anesthesia     patient states does not  take much medicine to put her to sleep   . PONV (postoperative nausea and vomiting)   . Heart murmur   . Mitral valve prolapse     no symptoms   . Edema     ankles to knees   . Chronic kidney disease     idiopathic micro hematuria   . Headache(784.0)     hx of migraines   . Arthritis   . Anemia     Past Surgical History  Procedure Laterality Date  . Veins stripping       bilateral legs   . Tubal ligation  1986  . Cervical fusion  2006    cervical 5-6   . Total knee arthroplasty Right 08/31/2013    Procedure: RIGHT TOTAL KNEE ARTHROPLASTY;  Surgeon: [name], MD;  Location: [facility];  Service: Orthopedics;  Laterality: Right;  . Transthoracic echocardiogram  11/05/6387    LV SYSTOLIC FUNCTION NORMAL, EF=>55%, INSIGNIFICANT PERICARDIAL EFFUSION, LEFT ATRIAL SIZE IS NORMAL, RV SYSTOLIC PRESSURE IS NORMAL. NO SIGN VALVULAR HEART DISEASE.  Marland Kitchen Myocardial perfusion study  07/02/2011    NO SCINTIGRAPHIC EVIDENCE OF INDUCIBLE MYOCARDIAL ISCHEMIA. POST-STRESS EF IS 87%.EXERCISE CAPACITY 9 METS. EKG SHOWS NSR AT 69. THERE IS 1MM HORIZONTAL ST DEPRESSION WITH EXERCISE IN II, III, AVF SEEN BEST AT EARLY RECOVERY. THIS IS PRESENT IN V3-V5 AS WELL AND RETURNS TO BASELINE AT THE END OF THE TEST. NORMAL STUDY.  . Renal artery doppler  12/28/2003  CELIAC ARTERY: AT REST, 168.4 CM/S; INSPRIATION, 177.6 CM/S. ARCUATE LIGAMENT COMPRESSION SYNDROME. R & L KIDNEYS: RIGHT KIDNEY MEASURES SOME WHAT SMALLER THAN LEFT. KIDNEY ARE ESSENTIALLY SYMMETRICAL IN SHAPE WITH NO OBVIOUS ABNORMALITY VISUALIZED. R & L RENAL ARTERIES: DEMONSTRATE NORMAL SPECTRA. NO SUGGESTION OF DIAMETER REDUCTION, DISSECTION, FIBROMUSCULAR DYSPLASIA OR ANY OTHER VASCULAR ABNOR    Allergies  Allergen Reactions  . Sporanox [Itraconazole] Swelling    Bilateral knee and ankle swelling and pain     Current Outpatient Prescriptions  Medication Sig Dispense Refill  . amLODipine (NORVASC) 10 MG tablet Take 10 mg by mouth daily.    .  benazepril (LOTENSIN) 10 MG tablet Take 10 mg by mouth daily.    . calcium carbonate (TUMS - DOSED IN MG ELEMENTAL CALCIUM) 500 MG chewable tablet Chew 1 tablet by mouth 4 (four) times daily.    . cholecalciferol (VITAMIN D) 400 UNITS TABS tablet Take 800 Units by mouth daily.     . fexofenadine (ALLEGRA) 180 MG tablet Take 180 mg by mouth daily.    . Glucosamine-Chondroitin 750-600 MG TABS Take 1 tablet by mouth 2 (two) times daily.     . Magnesium 250 MG TABS Take 1 tablet by mouth daily.    . Melatonin 3 MG TABS Take 1 tablet by mouth at bedtime.     . Multiple Vitamin (MULTIVITAMIN WITH MINERALS) TABS tablet Take 1 tablet by mouth daily.    . Omega-3 Fatty Acids (FISH OIL) 1200 MG CAPS Take 1 capsule by mouth 2 (two) times daily.    Marland Kitchen pyridOXINE (VITAMIN B-6) 100 MG tablet Take 100 mg by mouth daily.    . rosuvastatin (CRESTOR) 40 MG tablet Take 1 tablet (40 mg total) by mouth daily. 30 tablet 10  . SUMAtriptan (IMITREX) 25 MG tablet Take 25 mg by mouth as needed for migraine or headache. May repeat in 2 hours if headache persists or recurs.     No current facility-administered medications for this visit.    History   Social History  . Marital Status: Married    Spouse Name: N/A    Number of Children: N/A  . Years of Education: N/A   Occupational History  . Not on file.   Social History Main Topics  . Smoking status: Former Smoker    Types: Cigarettes    Quit date: 05/13/1988  . Smokeless tobacco: Never Used  . Alcohol Use: Yes     Comment: wine- rarely.  . Drug Use: No  . Sexual Activity: Not on file   Other Topics Concern  . Not on file   Social History Narrative   Social history is notable that she is married and has remained active. She does water aerobics and also rides a bicycle. She does not smoke, but there is a remote history of tobacco use having quit approximately 24 years ago. She has 2 children. She is the sister-in-law of Dr. Adella Hare and is married  to Dr. Daylene Posey wife's brother.  She is retired Chief Technology Officer and retired at age 62.  Family History  Problem Relation Age of Onset  . Dementia Mother   . Heart attack Father   . Heart disease Father   . Kidney failure Father   . Heart attack Brother     age 54  . Cancer - Other Brother     kidney   ROS General: Negative; No fevers, chills, or night sweats;  HEENT: Negative; No changes in vision or hearing, sinus  congestion, difficulty swallowing Pulmonary: Negative; No cough, wheezing, shortness of breath, hemoptysis Cardiovascular: See history of present illness GI: Negative; No nausea, vomiting, diarrhea, or abdominal pain GU: Negative; No dysuria, hematuria, or difficulty voiding Musculoskeletal: Negative; no myalgias, joint pain, or weakness Hematologic/Oncology: Negative; no easy bruising, bleeding Endocrine: Negative; no heat/cold intolerance; no diabetes Neuro: Positive for history of migraines, currently approximately 3-4 per year Skin: Negative; No rashes or skin lesions Psychiatric: Negative; No behavioral problems, depression Sleep: Negative; No snoring, daytime sleepiness, hypersomnolence, bruxism, restless legs, hypnogognic hallucinations, no cataplexy Other comprehensive 14 point system review is negative.   PE BP 120/60 mmHg  Pulse 69  Ht 5\' 5"  (1.651 m)  Wt 144 lb 8 oz (65.545 kg)  BMI 24.05 kg/m2  General: Alert, oriented, no distress.  Skin: normal turgor, no rashes HEENT: Normocephalic, atraumatic. Pupils round and reactive; sclera anicteric;no lid lag. Extraocular muscles intact. Nose without nasal septal hypertrophy Mouth/Parynx benign; Mallinpatti scale 2 Neck: No JVD, no carotid bruits; normal carotid upstroke Lungs: clear to ausculatation and percussion; no wheezing or rales Chest wall: no tenderness to palpitation Heart: RRR, s1 s2 normal 1/6 systolic murmur Abdomen: soft, nontender; no hepatosplenomehaly, BS+; abdominal aorta  nontender and not dilated by palpation. Back: no CVA tenderness Pulses 2+ Extremities: no clubbing cyanosis or edema, Homan's sign negative  Neurologic: grossly nonfocal; cranial nerves grossly normal. Psychologic: normal affect and mood.  ECG (independently read by me): Normal sinus rhythm at 69 bpm.  Normal intervals.  No significant ST segment changes.  January 2015 ECG (independently read by me): Normal sinus rhythm with nonspecific ST-T changes. Heart rate 75 beats per minute.  LABS:  BMET    Component Value Date/Time   NA 142 05/10/2014 0845   K 4.3 05/10/2014 0845   CL 106 05/10/2014 0845   CO2 29 05/10/2014 0845   GLUCOSE 94 05/10/2014 0845   BUN 16 05/10/2014 0845   CREATININE 0.95 05/10/2014 0845   CREATININE 0.86 09/01/2013 0347   CALCIUM 9.6 05/10/2014 0845   GFRNONAA 70* 09/01/2013 0347   GFRNONAA 62 05/17/2013 0804   GFRAA 81* 09/01/2013 0347   GFRAA 71 05/17/2013 0804     Hepatic Function Panel     Component Value Date/Time   PROT 6.9 05/10/2014 0845   ALBUMIN 4.1 05/10/2014 0845   AST 19 05/10/2014 0845   ALT 17 05/10/2014 0845   ALKPHOS 69 05/10/2014 0845   BILITOT 0.4 05/10/2014 0845     CBC    Component Value Date/Time   WBC 5.2 05/10/2014 0845   RBC 4.41 05/10/2014 0845   HGB 12.7 05/10/2014 0845   HCT 39.1 05/10/2014 0845   PLT 282 05/10/2014 0845   MCV 88.7 05/10/2014 0845   MCH 28.8 05/10/2014 0845   MCHC 32.5 05/10/2014 0845   RDW 13.2 05/10/2014 0845     BNP No results found for: PROBNP  Lipid Panel     Component Value Date/Time   CHOL 184 05/10/2014 0845   TRIG 54 05/10/2014 0845   HDL 72 05/10/2014 0845   CHOLHDL 2.6 05/10/2014 0845   VLDL 11 05/10/2014 0845   LDLCALC 101* 05/10/2014 0845     RADIOLOGY: No results found.    ASSESSMENT AND PLAN:  Ms. Prue is 64 year old female has a long-standing history of hypertension as well as hyperlipidemia. Presently, her blood pressure is well controlled on  combination therapy with amlodipine 10 mg,and  benazepril 10 mg. Her lipid studies are very good on her current  dose of Crestor 40 mg. Her HDL has risen to 72 with ability for increased exercise.  following her knee surgery.  Her frequency of migraine headaches is stable.  She is experiencing occasional short-lived palpitations which seem to occur every 1-2 weeks.  I'm electing to add metoprolol succinate.  She will start initially at 12.5 mg to see if this corrects her palpitations.  If not, she will titrate this to 25 mg.  If she continues to express palpitations.  She will contact our office and I will obtain a monitor.  I reviewed her recent laboratory with her in detail.  I will see her in 6 months for reevaluation or sooner if problems arise.  Time spent: 25 minutes   Troy Sine, MD, [facility]  05/23/2014 10:24 AM

## 2014-06-06 ENCOUNTER — Telehealth: Payer: Self-pay | Admitting: Cardiovascular Disease

## 2014-06-06 NOTE — Telephone Encounter (Signed)
Pt has a rash, it started Thursday night.She thinks it might be a reaction to Metoprolol. Please call back after 1:00.

## 2014-06-06 NOTE — Telephone Encounter (Signed)
Pt called reporting trunk distributed rash that she noticed a little over 1 week ago, started on stomach and expanded to upper chest, thighs, arms.  Only recent med change is metoprolol. She was put on for palpitation symptoms to take 12.5mg  nightly or increase to 25mg  if not initially effective. She reports symptoms have been fine w/ the 12.5mg  nightly dose.  Pt missed last night's metoprolol dose.  She has taken benadryl PO and gold bond cream for rash relief. Benadryl effective, she states gold bond does not help.  I suggested continue PO benadryl for symptom relief, will route to Dr. Claiborne Billings for advice on metoprolol.

## 2014-06-06 NOTE — Telephone Encounter (Signed)
If the rash is temporally related to metoprolol initiation and wean and dc to see if resolves

## 2014-06-07 NOTE — Telephone Encounter (Signed)
Called and spoke to patient, she will d/c metoprolol, continue benadryl to see if rash resolves, and call in a few days w/ update.

## 2014-07-12 ENCOUNTER — Telehealth: Payer: Self-pay | Admitting: Cardiovascular Disease

## 2014-07-12 NOTE — Telephone Encounter (Signed)
OK'd phone request from pharmacy for 90 day supply for benazapril.

## 2014-07-12 NOTE — Telephone Encounter (Signed)
Remo Lipps would like to change the pt's prescriptions to a 90 day supply

## 2014-08-15 ENCOUNTER — Telehealth: Payer: Self-pay | Admitting: Cardiovascular Disease

## 2014-08-17 NOTE — Telephone Encounter (Signed)
Closed enounter °

## 2014-11-07 ENCOUNTER — Other Ambulatory Visit: Payer: Self-pay

## 2014-11-21 ENCOUNTER — Encounter: Payer: Self-pay | Admitting: Cardiovascular Disease

## 2014-11-21 ENCOUNTER — Ambulatory Visit (INDEPENDENT_AMBULATORY_CARE_PROVIDER_SITE_OTHER): Payer: Medicare Other | Admitting: Cardiovascular Disease

## 2014-11-21 VITALS — BP 122/60 | HR 72 | Ht 65.0 in | Wt 149.8 lb

## 2014-11-21 DIAGNOSIS — E785 Hyperlipidemia, unspecified: Secondary | ICD-10-CM

## 2014-11-21 DIAGNOSIS — Z79899 Other long term (current) drug therapy: Secondary | ICD-10-CM

## 2014-11-21 DIAGNOSIS — R002 Palpitations: Secondary | ICD-10-CM

## 2014-11-21 DIAGNOSIS — I10 Essential (primary) hypertension: Secondary | ICD-10-CM

## 2014-11-21 DIAGNOSIS — Z96651 Presence of right artificial knee joint: Secondary | ICD-10-CM

## 2014-11-21 NOTE — Progress Notes (Signed)
Patient ID: Mary [name], female   DOB: 1951/03/30, 64 y.o.   MRN: [id number]     HPI: Mary [name] is a 64 y.o. female presents to the office for a one year cardiology evaluation.  Mary [name] has a long-standing history of migraine headaches,  hypertension, hyperlipidemia, GERD, and has strong family history for both coronary as well as peripheral vascular disease. She also has PVD with lower extremity venous abnormalities. Remotely, she had been on pravastatin for hyperlipidemia but this was switched to Crestor do to inadequate response to pravastatin.   Over the past year, she has felt well on her current medical regimen which consists of amlodipine 10 mg, benazepril 10 mg for hypetension as well as her migraine headaches and Crestor 40 mg for hyperlipidemia.   She had developed a rash and stopped taking Toprol-XL 12.5 mg.  She denies any palpitations.  Following its discontinuance or awareness of blood pressure elevation.  She denies chest pain. She denies shortness of breath.   She exercises regularly and does water low [place] a proximally 3-4 times per week. Laboratory in December 2015 revealed an LDL cholesterol 101 total cholesterol 184. Normal thyroid function.  Past Medical History  Diagnosis Date  . Hypertension   . Hyperlipidemia   . GERD (gastroesophageal reflux disease)   . Complication of anesthesia     patient states does not take much medicine to put her to sleep   . PONV (postoperative nausea and vomiting)   . Heart murmur   . Mitral valve prolapse     no symptoms   . Edema     ankles to knees   . Chronic kidney disease     idiopathic micro hematuria   . Headache(784.0)     hx of migraines   . Arthritis   . Anemia     Past Surgical History  Procedure Laterality Date  . Veins stripping       bilateral legs   . Tubal ligation  1986  . Cervical fusion  2006    cervical 5-6   . Total knee arthroplasty Right 08/31/2013    Procedure: RIGHT TOTAL KNEE  ARTHROPLASTY;  Surgeon: [name], MD;  Location: [facility];  Service: Orthopedics;  Laterality: Right;  . Transthoracic echocardiogram  07/26/1759    LV SYSTOLIC FUNCTION NORMAL, EF=>55%, INSIGNIFICANT PERICARDIAL EFFUSION, LEFT ATRIAL SIZE IS NORMAL, RV SYSTOLIC PRESSURE IS NORMAL. NO SIGN VALVULAR HEART DISEASE.  Marland Kitchen Myocardial perfusion study  07/02/2011    NO SCINTIGRAPHIC EVIDENCE OF INDUCIBLE MYOCARDIAL ISCHEMIA. POST-STRESS EF IS 87%.EXERCISE CAPACITY 9 METS. EKG SHOWS NSR AT 69. THERE IS 1MM HORIZONTAL ST DEPRESSION WITH EXERCISE IN II, III, AVF SEEN BEST AT EARLY RECOVERY. THIS IS PRESENT IN V3-V5 AS WELL AND RETURNS TO BASELINE AT THE END OF THE TEST. NORMAL STUDY.  . Renal artery doppler  12/28/2003    CELIAC ARTERY: AT REST, 168.4 CM/S; INSPRIATION, 177.6 CM/S. ARCUATE LIGAMENT COMPRESSION SYNDROME. R & L KIDNEYS: RIGHT KIDNEY MEASURES SOME WHAT SMALLER THAN LEFT. KIDNEY ARE ESSENTIALLY SYMMETRICAL IN SHAPE WITH NO OBVIOUS ABNORMALITY VISUALIZED. R & L RENAL ARTERIES: DEMONSTRATE NORMAL SPECTRA. NO SUGGESTION OF DIAMETER REDUCTION, DISSECTION, FIBROMUSCULAR DYSPLASIA OR ANY OTHER VASCULAR ABNOR    Allergies  Allergen Reactions  . Sporanox [Itraconazole] Swelling    Bilateral knee and ankle swelling and pain     Current Outpatient Prescriptions  Medication Sig Dispense Refill  . amLODipine (NORVASC) 10 MG tablet Take 1 tablet (10 mg total) by  mouth daily. 30 tablet 6  . benazepril (LOTENSIN) 10 MG tablet Take 1 tablet (10 mg total) by mouth daily. 30 tablet 6  . calcium carbonate (TUMS - DOSED IN MG ELEMENTAL CALCIUM) 500 MG chewable tablet Chew 1 tablet by mouth 4 (four) times daily.    . cholecalciferol (VITAMIN D) 400 UNITS TABS tablet Take 800 Units by mouth daily.     Marland Kitchen co-enzyme Q-10 30 MG capsule Take 100 mg by mouth 2 (two) times daily.    . fexofenadine (ALLEGRA) 180 MG tablet Take 180 mg by mouth daily.    . fluticasone (FLONASE) 50 MCG/ACT nasal spray Place 2 sprays into  both nostrils daily.    . Glucosamine-Chondroitin 750-600 MG TABS Take 1 tablet by mouth 2 (two) times daily.     . Magnesium 250 MG TABS Take 1 tablet by mouth daily.    . Melatonin 3 MG TABS Take 1 tablet by mouth at bedtime.     . Multiple Vitamin (MULTIVITAMIN WITH MINERALS) TABS tablet Take 1 tablet by mouth daily.    . Omega-3 Fatty Acids (FISH OIL) 1200 MG CAPS Take 1 capsule by mouth 2 (two) times daily.    Marland Kitchen pyridOXINE (VITAMIN B-6) 100 MG tablet Take 100 mg by mouth daily.    . rosuvastatin (CRESTOR) 40 MG tablet Take 1 tablet (40 mg total) by mouth daily. 30 tablet 6  . SUMAtriptan (IMITREX) 25 MG tablet Take 25 mg by mouth as needed for migraine or headache. May repeat in 2 hours if headache persists or recurs.     No current facility-administered medications for this visit.    History   Social History  . Marital Status: Married    Spouse Name: N/A  . Number of Children: N/A  . Years of Education: N/A   Occupational History  . Not on file.   Social History Main Topics  . Smoking status: Former Smoker    Types: Cigarettes    Quit date: 05/13/1988  . Smokeless tobacco: Never Used  . Alcohol Use: Yes     Comment: wine- rarely.  . Drug Use: No  . Sexual Activity: Not on file   Other Topics Concern  . Not on file   Social History Narrative   Social history is notable that she is married and has remained active. She does water aerobics and also rides a bicycle. She does not smoke, but there is a remote history of tobacco use having quit approximately 24 years ago. She has 2 children. She is the sister-in-law of Dr. Adella Hare and is married to Dr. Daylene Posey wife's brother.  She is retired Chief Technology Officer and retired at age 85.  Family History  Problem Relation Age of Onset  . Dementia Mother   . Heart attack Father   . Heart disease Father   . Kidney failure Father   . Heart attack Brother     age 49  . Cancer - Other Brother     kidney    ROS General: Negative; No fevers, chills, or night sweats;  HEENT: Negative; No changes in vision or hearing, sinus congestion, difficulty swallowing Pulmonary: Negative; No cough, wheezing, shortness of breath, hemoptysis Cardiovascular: See history of present illness GI: Negative; No nausea, vomiting, diarrhea, or abdominal pain GU: Negative; No dysuria, hematuria, or difficulty voiding Musculoskeletal: Negative; no myalgias, joint pain, or weakness Hematologic/Oncology: Negative; no easy bruising, bleeding Endocrine: Negative; no heat/cold intolerance; no diabetes Neuro: Positive for history of migraines, currently  approximately 3-4 per year Skin: Negative; No rashes or skin lesions Psychiatric: Negative; No behavioral problems, depression Sleep: Negative; No snoring, daytime sleepiness, hypersomnolence, bruxism, restless legs, hypnogognic hallucinations, no cataplexy Other comprehensive 14 point system review is negative.   PE BP 122/60 mmHg  Pulse 72  Ht '5\' 5"'  (1.651 m)  Wt 149 lb 12.8 oz (67.949 kg)  BMI 24.93 kg/m2  Wt Readings from Last 3 Encounters:  11/21/14 149 lb 12.8 oz (67.949 kg)  05/23/14 144 lb 8 oz (65.545 kg)  03/15/14 136 lb (61.689 kg)   General: Alert, oriented, no distress.  Skin: normal turgor, no rashes HEENT: Normocephalic, atraumatic. Pupils round and reactive; sclera anicteric;no lid lag. Extraocular muscles intact. Nose without nasal septal hypertrophy Mouth/Parynx benign; Mallinpatti scale 2 Neck: No JVD, no carotid bruits; normal carotid upstroke Lungs: clear to ausculatation and percussion; no wheezing or rales Chest wall: no tenderness to palpitation Heart: RRR, s1 s2 normal 1/6 systolic murmur; .  No diastolic murmur.  No rubs thrills or heaves. Abdomen: soft, nontender; no hepatosplenomehaly, BS+; abdominal aorta nontender and not dilated by palpation. Back: no CVA tenderness Pulses 2+ Extremities: no clubbing cyanosis or edema, Homan's  sign negative  Neurologic: grossly nonfocal; cranial nerves grossly normal. Psychologic: normal affect and mood.  ECG (independently read by me):  Normal sinus rhythm at 72 bpm. Borderline left atrial enlargement.  January 2016 ECG (independently read by me): Normal sinus rhythm at 69 bpm.  Normal intervals.  No significant ST segment changes.  January 2015 ECG (independently read by me): Normal sinus rhythm with nonspecific ST-T changes. Heart rate 75 beats per minute.  LABS: BMP Latest Ref Rng 05/10/2014 09/01/2013 08/20/2013  Glucose 70 - 99 mg/dL 94 154(H) 84  BUN 6 - 23 mg/dL '16 19 20  ' Creatinine 0.50 - 1.10 mg/dL 0.95 0.86 0.89  Sodium 135 - 145 mEq/L 142 136(L) 144  Potassium 3.5 - 5.3 mEq/L 4.3 5.0 4.4  Chloride 96 - 112 mEq/L 106 103 105  CO2 19 - 32 mEq/L '29 23 28  ' Calcium 8.4 - 10.5 mg/dL 9.6 8.3(L) 9.5   Hepatic Function Latest Ref Rng 05/10/2014 05/17/2013  Total Protein 6.0 - 8.3 g/dL 6.9 6.9  Albumin 3.5 - 5.2 g/dL 4.1 4.0  AST 0 - 37 U/L 19 19  ALT 0 - 35 U/L 17 17  Alk Phosphatase 39 - 117 U/L 69 63  Total Bilirubin 0.2 - 1.2 mg/dL 0.4 0.4   CBC Latest Ref Rng 05/10/2014 09/01/2013 08/20/2013  WBC 4.0 - 10.5 K/uL 5.2 11.2(H) 6.2  Hemoglobin 12.0 - 15.0 g/dL 12.7 10.6(L) 13.4  Hematocrit 36.0 - 46.0 % 39.1 32.6(L) 40.7  Platelets 150 - 400 K/uL 282 199 221   Lab Results  Component Value Date   MCV 88.7 05/10/2014   MCV 90.6 09/01/2013   MCV 90.6 08/20/2013   Lab Results  Component Value Date   TSH 2.009 05/10/2014   Lipid Panel     Component Value Date/Time   CHOL 184 05/10/2014 0845   TRIG 54 05/10/2014 0845   HDL 72 05/10/2014 0845   CHOLHDL 2.6 05/10/2014 0845   VLDL 11 05/10/2014 0845   LDLCALC 101* 05/10/2014 0845     RADIOLOGY: No results found.    ASSESSMENT AND PLAN:  Ms. Charter is 64 year old female who has a long-standing history of hypertension as well as hyperlipidemia. Presently, her blood pressure is well controlled on  combination therapy with amlodipine 10 mg,and  benazepril 10 mg. She  is no longer taking Toprol secondary to a rash.  She denies recurrent palpitations with discontinuance.  Lipid studies have improved with Crestor 40 mg but LDL remains mildly elevated at 101.  Last blood work was done right around Christmas new year holiday. She is now able to exercise more.  Following her orthopedic surgery which was done by Dr. Alvan Dame with right knee replacement. Her weight is stable and body mass index is excellent at 24.9.  Laboratory will be rechecked in the fasting state.  Adjustments to her medical regimen will be made if needed. I will see her in one year for follow-up evaluation.  Time spent: 25 minutes   Troy Sine, MD, [facility]  11/21/2014 7:16 PM

## 2014-11-21 NOTE — Patient Instructions (Signed)
Your physician recommends that you return for lab work fasting.   Your physician wants you to follow-up in: 1 year or sooner if needed. You will receive a reminder letter in the mail two months in advance. If you don't receive a letter, please call our office to schedule the follow-up appointment.   

## 2014-12-30 LAB — CBC
HEMATOCRIT: 33.7 % — AB (ref 36.0–46.0)
Hemoglobin: 10.5 g/dL — ABNORMAL LOW (ref 12.0–15.0)
MCH: 25.4 pg — ABNORMAL LOW (ref 26.0–34.0)
MCHC: 31.2 g/dL (ref 30.0–36.0)
MCV: 81.6 fL (ref 78.0–100.0)
MPV: 8.6 fL (ref 8.6–12.4)
Platelets: 285 10*3/uL (ref 150–400)
RBC: 4.13 MIL/uL (ref 3.87–5.11)
RDW: 15.9 % — ABNORMAL HIGH (ref 11.5–15.5)
WBC: 4.1 10*3/uL (ref 4.0–10.5)

## 2014-12-30 LAB — COMPREHENSIVE METABOLIC PANEL
ALBUMIN: 3.9 g/dL (ref 3.6–5.1)
ALK PHOS: 73 U/L (ref 33–130)
ALT: 17 U/L (ref 6–29)
AST: 19 U/L (ref 10–35)
BUN: 17 mg/dL (ref 7–25)
CALCIUM: 9 mg/dL (ref 8.6–10.4)
CO2: 29 mmol/L (ref 20–31)
Chloride: 105 mmol/L (ref 98–110)
Creat: 0.94 mg/dL (ref 0.50–0.99)
Glucose, Bld: 93 mg/dL (ref 65–99)
Potassium: 4.4 mmol/L (ref 3.5–5.3)
Sodium: 140 mmol/L (ref 135–146)
Total Bilirubin: 0.4 mg/dL (ref 0.2–1.2)
Total Protein: 6.8 g/dL (ref 6.1–8.1)

## 2014-12-30 LAB — LIPID PANEL
CHOLESTEROL: 173 mg/dL (ref 125–200)
HDL: 75 mg/dL (ref >=46)
LDL Cholesterol: 87 mg/dL (ref <130)
TRIGLYCERIDES: 54 mg/dL (ref <150)
Total CHOL/HDL Ratio: 2.3 Ratio (ref <=5.0)
VLDL: 11 mg/dL (ref <30)

## 2014-12-30 LAB — TSH: TSH: 2.014 u[IU]/mL (ref 0.350–4.500)

## 2015-01-12 ENCOUNTER — Telehealth: Payer: Self-pay | Admitting: *Deleted

## 2015-01-12 NOTE — Telephone Encounter (Signed)
Signed clearance form by Dr Claiborne Billings was faxed to [facility] orthopaedics Procedure: Left: TKA-Medial and Lateral w/wo Patella Resurfacing, surgery date 03/07/15

## 2015-01-16 ENCOUNTER — Other Ambulatory Visit: Payer: Self-pay | Admitting: Cardiovascular Disease

## 2015-01-18 ENCOUNTER — Other Ambulatory Visit (HOSPITAL_COMMUNITY): Payer: Self-pay | Admitting: Internal Medicine

## 2015-01-18 DIAGNOSIS — Z1231 Encounter for screening mammogram for malignant neoplasm of breast: Secondary | ICD-10-CM

## 2015-02-23 ENCOUNTER — Ambulatory Visit (HOSPITAL_COMMUNITY)
Admission: RE | Admit: 2015-02-23 | Discharge: 2015-02-23 | Disposition: A | Discharge status: Home / Self Care | Payer: Medicare Other | Source: Ambulatory Visit | Attending: Internal Medicine | Admitting: Internal Medicine

## 2015-02-23 DIAGNOSIS — Z1231 Encounter for screening mammogram for malignant neoplasm of breast: Secondary | ICD-10-CM

## 2015-02-27 ENCOUNTER — Other Ambulatory Visit (HOSPITAL_COMMUNITY): Payer: Self-pay | Admitting: *Deleted

## 2015-02-27 NOTE — Patient Instructions (Addendum)
Mary [name]  02/27/2015   Your procedure is scheduled on: 03-07-15   Report to [facility] Main  Entrance take [facility]  elevators to 3rd floor to  [place] at 700 AM.  Call this number if you have problems the morning of surgery [phone]   Remember: ONLY 1 PERSON MAY GO WITH YOU TO SHORT STAY TO GET  READY MORNING OF [place].  Do not eat food or drink liquids :After Midnight.  BRING CPAP MASK AND TUBING   Take these medicines the morning of surgery with A SIP OF WATER: Amlodipine, Allegra, Flonase Nasal Spray, TAKE 1/2 DOSE BEDTIME LANTUS INSULIN ON 03-05-15                               You may not have any metal on your body including hair pins and              piercings  Do not wear jewelry, make-up, lotions, powders or perfumes, deodorant             Do not wear nail polish.  Do not shave  48 hours prior to surgery.              Men may shave face and neck.   Do not bring valuables to the hospital. [place].  Contacts, dentures or bridgework may not be worn into surgery.  Leave suitcase in the car. After surgery it may be brought to your room.     Patients discharged the day of surgery will not be allowed to drive home.  Name and phone number of your driver:  Special Instructions: N/A              Please read over the following fact sheets you were given: _____________________________________________________________________             [facility] - Preparing for Surgery Before surgery, you can play an important role.  Because skin is not sterile, your skin needs to be as free of germs as possible.  You can reduce the number of germs on your skin by washing with CHG (chlorahexidine gluconate) soap before surgery.  CHG is an antiseptic cleaner which kills germs and bonds with the skin to continue killing germs even after washing. Please DO NOT use if you have an allergy to CHG or  antibacterial soaps.  If your skin becomes reddened/irritated stop using the CHG and inform your nurse when you arrive at Short Stay. Do not shave (including legs and underarms) for at least 48 hours prior to the first CHG shower.  You may shave your face/neck. Please follow these instructions carefully:  1.  Shower with CHG Soap the night before surgery and the  morning of Surgery.  2.  If you choose to wash your hair, wash your hair first as usual with your  normal  shampoo.  3.  After you shampoo, rinse your hair and body thoroughly to remove the  shampoo.                           4.  Use CHG as you would any other liquid soap.  You can apply chg directly  to the skin and wash                       Gently with a scrungie or clean washcloth.  5.  Apply the CHG Soap to your body ONLY FROM THE NECK DOWN.   Do not use on face/ open                           Wound or open sores. Avoid contact with eyes, ears mouth and genitals (private parts).                       Wash face,  Genitals (private parts) with your normal soap.             6.  Wash thoroughly, paying special attention to the area where your surgery  will be performed.  7.  Thoroughly rinse your body with warm water from the neck down.  8.  DO NOT shower/wash with your normal soap after using and rinsing off  the CHG Soap.                9.  Pat yourself dry with a clean towel.            10.  Wear clean pajamas.            11.  Place clean sheets on your bed the night of your first shower and do not  sleep with pets. Day of Surgery : Do not apply any lotions/deodorants the morning of surgery.  Please wear clean clothes to the hospital/surgery center.  FAILURE TO FOLLOW THESE INSTRUCTIONS MAY RESULT IN THE CANCELLATION OF YOUR SURGERY PATIENT SIGNATURE_________________________________  NURSE SIGNATURE__________________________________  ________________________________________________________________________   Mary [name]  An incentive spirometer is a tool that can help keep your lungs clear and active. This tool measures how well you are filling your lungs with each breath. Taking long deep breaths may help reverse or decrease the chance of developing breathing (pulmonary) problems (especially infection) following:  A long period of time when you are unable to move or be active. BEFORE THE PROCEDURE   If the spirometer includes an indicator to show your best effort, your nurse or respiratory therapist will set it to a desired goal.  If possible, sit up straight or lean slightly forward. Try not to slouch.  Hold the incentive spirometer in an upright position. INSTRUCTIONS FOR USE   Sit on the edge of your bed if possible, or sit up as far as you can in bed or on a chair.  Hold the incentive spirometer in an upright position.  Breathe out normally.  Place the mouthpiece in your mouth and seal your lips tightly around it.  Breathe in slowly and as deeply as possible, raising the piston or the ball toward the top of the column.  Hold your breath for 3-5 seconds or for as long as possible. Allow the piston or ball to fall to the bottom of the column.  Remove the mouthpiece from your mouth and breathe out normally.  Rest for a few seconds and repeat Steps 1 through 7 at least 10 times every 1-2 hours when you are awake. Take your time and take a few normal breaths between deep breaths.  The spirometer may include an indicator to show your best effort. Use the indicator as a goal to work toward during each repetition.  After each  set of 10 deep breaths, practice coughing to be sure your lungs are clear. If you have an incision (the cut made at the time of surgery), support your incision when coughing by placing a pillow or rolled up towels firmly against it. Once you are able to get out of bed, walk around indoors and cough well. You may stop using the incentive spirometer when instructed by  your caregiver.  RISKS AND COMPLICATIONS  Take your time so you do not get dizzy or light-headed.  If you are in pain, you may need to take or ask for pain medication before doing incentive spirometry. It is harder to take a deep breath if you are having pain. AFTER USE  Rest and breathe slowly and easily.  It can be helpful to keep track of a log of your progress. Your caregiver can provide you with a simple table to help with this. If you are using the spirometer at home, follow these instructions: [place] IF:   You are having difficultly using the spirometer.  You have trouble using the spirometer as often as instructed.  Your pain medication is not giving enough relief while using the spirometer.  You develop fever of 100.5 F (38.1 C) or higher. SEEK IMMEDIATE MEDICAL CARE IF:   You cough up bloody sputum that had not been present before.  You develop fever of 102 F (38.9 C) or greater.  You develop worsening pain at or near the incision site. MAKE SURE YOU:   Understand these instructions.  Will watch your condition.  Will get help right away if you are not doing well or get worse. Document Released: 09/09/2006 Document Revised: 07/22/2011 Document Reviewed: 11/10/2006 Exit[employer] Patient Information 2014 [employer], [place].   ________________________________________________________________________  WHAT IS A BLOOD TRANSFUSION? Blood Transfusion Information  A transfusion is the replacement of blood or some of its parts. Blood is made up of multiple cells which provide different functions.  Red blood cells carry oxygen and are used for blood loss replacement.  White blood cells fight against infection.  Platelets control bleeding.  Plasma helps clot blood.  Other blood products are available for specialized needs, such as hemophilia or other clotting disorders. BEFORE THE TRANSFUSION  Who gives blood for transfusions?   Healthy volunteers who are  fully evaluated to make sure their blood is safe. This is blood bank blood. Transfusion therapy is the safest it has ever been in the practice of medicine. Before blood is taken from a donor, a complete history is taken to make sure that person has no history of diseases nor engages in risky social behavior (examples are intravenous drug use or sexual activity with multiple partners). The donor's travel history is screened to minimize risk of transmitting infections, such as malaria. The donated blood is tested for signs of infectious diseases, such as HIV and hepatitis. The blood is then tested to be sure it is compatible with you in order to minimize the chance of a transfusion reaction. If you or a relative donates blood, this is often done in anticipation of surgery and is not appropriate for emergency situations. It takes many days to process the donated blood. RISKS AND COMPLICATIONS Although transfusion therapy is very safe and saves many lives, the main dangers of transfusion include:   Getting an infectious disease.  Developing a transfusion reaction. This is an allergic reaction to something in the blood you were given. Every precaution is taken to prevent this. The decision to have  a blood transfusion has been considered carefully by your caregiver before blood is given. Blood is not given unless the benefits outweigh the risks. AFTER THE TRANSFUSION  Right after receiving a blood transfusion, you will usually feel much better and more energetic. This is especially true if your red blood cells have gotten low (anemic). The transfusion raises the level of the red blood cells which carry oxygen, and this usually causes an energy increase.  The nurse administering the transfusion will monitor you carefully for complications. HOME CARE INSTRUCTIONS  No special instructions are needed after a transfusion. You may find your energy is better. Speak with your caregiver about any limitations on  activity for underlying diseases you may have. SEEK MEDICAL CARE IF:   Your condition is not improving after your transfusion.  You develop redness or irritation at the intravenous (IV) site. SEEK IMMEDIATE MEDICAL CARE IF:  Any of the following symptoms occur over the next 12 hours:  Shaking chills.  You have a temperature by mouth above 102 F (38.9 C), not controlled by medicine.  Chest, back, or muscle pain.  People around you feel you are not acting correctly or are confused.  Shortness of breath or difficulty breathing.  Dizziness and fainting.  You get a rash or develop hives.  You have a decrease in urine output.  Your urine turns a dark color or changes to pink, red, or brown. Any of the following symptoms occur over the next 10 days:  You have a temperature by mouth above 102 F (38.9 C), not controlled by medicine.  Shortness of breath.  Weakness after normal activity.  The white part of the eye turns yellow (jaundice).  You have a decrease in the amount of urine or are urinating less often.  Your urine turns a dark color or changes to pink, red, or brown. Document Released: 04/26/2000 Document Revised: 07/22/2011 Document Reviewed: 12/14/2007 [facility] Patient Information 2014 [place], [place].  _______________________________________________________________________

## 2015-02-27 NOTE — Progress Notes (Signed)
ekg 11-21-14 epic lov 11-21-14 dr Claiborne Billings epic  cardiac clearance note dr Claiborne Billings on chart nfor 03-07-15 surgery

## 2015-02-28 ENCOUNTER — Encounter (HOSPITAL_COMMUNITY): Payer: Self-pay

## 2015-02-28 ENCOUNTER — Encounter (HOSPITAL_COMMUNITY)
Admission: RE | Admit: 2015-02-28 | Discharge: 2015-02-28 | Disposition: A | Discharge status: Home / Self Care | Payer: Medicare Other | Source: Ambulatory Visit | Attending: Orthopedic Surgery | Admitting: Orthopedic Surgery

## 2015-02-28 DIAGNOSIS — Z01818 Encounter for other preprocedural examination: Secondary | ICD-10-CM | POA: Diagnosis not present

## 2015-02-28 DIAGNOSIS — M179 Osteoarthritis of knee, unspecified: Secondary | ICD-10-CM | POA: Diagnosis not present

## 2015-02-28 HISTORY — DX: Nonscarring hair loss, unspecified: L65.9

## 2015-02-28 LAB — URINALYSIS, ROUTINE W REFLEX MICROSCOPIC
Bilirubin Urine: NEGATIVE
GLUCOSE, UA: NEGATIVE mg/dL
Hgb urine dipstick: NEGATIVE
Ketones, ur: NEGATIVE mg/dL
LEUKOCYTES UA: NEGATIVE
Nitrite: NEGATIVE
PH: 7.5 (ref 5.0–8.0)
PROTEIN: NEGATIVE mg/dL
Specific Gravity, Urine: 1.006 (ref 1.005–1.030)
Urobilinogen, UA: 0.2 mg/dL (ref 0.0–1.0)

## 2015-02-28 LAB — CBC
HCT: 38.3 % (ref 36.0–46.0)
Hemoglobin: 12.4 g/dL (ref 12.0–15.0)
MCH: 28.1 pg (ref 26.0–34.0)
MCHC: 32.4 g/dL (ref 30.0–36.0)
MCV: 86.8 fL (ref 78.0–100.0)
PLATELETS: 236 10*3/uL (ref 150–400)
RBC: 4.41 MIL/uL (ref 3.87–5.11)
RDW: 16.6 % — AB (ref 11.5–15.5)
WBC: 5.3 10*3/uL (ref 4.0–10.5)

## 2015-02-28 LAB — BASIC METABOLIC PANEL
Anion gap: 7 (ref 5–15)
BUN: 18 mg/dL (ref 6–20)
CALCIUM: 9.4 mg/dL (ref 8.9–10.3)
CO2: 28 mmol/L (ref 22–32)
Chloride: 107 mmol/L (ref 101–111)
Creatinine, Ser: 1.15 mg/dL — ABNORMAL HIGH (ref 0.44–1.00)
GFR calc Af Amer: 56 mL/min — ABNORMAL LOW (ref >60)
GFR, EST NON AFRICAN AMERICAN: 48 mL/min — AB (ref >60)
GLUCOSE: 96 mg/dL (ref 65–99)
Potassium: 3.9 mmol/L (ref 3.5–5.1)
Sodium: 142 mmol/L (ref 135–145)

## 2015-02-28 LAB — PROTIME-INR
INR: 1.01 (ref 0.00–1.49)
PROTHROMBIN TIME: 13.5 s (ref 11.6–15.2)

## 2015-02-28 LAB — SURGICAL PCR SCREEN
MRSA, PCR: NEGATIVE
Staphylococcus aureus: NEGATIVE

## 2015-02-28 LAB — APTT: APTT: 39 s — AB (ref 24–37)

## 2015-03-02 NOTE — H&P (Signed)
TOTAL KNEE ADMISSION H&P  Patient is being admitted for left total knee arthroplasty.  Subjective:  Chief Complaint:      Left knee primary OA / pain  HPI: Mary [name], 64 y.o. female, has a history of pain and functional disability in the left knee due to arthritis and has failed non-surgical conservative treatments for greater than 12 weeks to include NSAID's and/or analgesics, corticosteriod injections, viscosupplementation injections and activity modification.  Onset of symptoms was gradual, starting years ago with gradually worsening course since that time. The patient noted prior procedures on the knee to include  arthroplasty on the right knee(s).  Patient currently rates pain in the left knee(s) at 7 out of 10 with activity. Patient has worsening of pain with activity and weight bearing, pain that interferes with activities of daily living, pain with passive range of motion, crepitus and joint swelling.  Patient has evidence of periarticular osteophytes and joint space narrowing by imaging studies.  There is no active infection.  Risks, benefits and expectations were discussed with the patient.  Risks including but not limited to the risk of anesthesia, blood clots, nerve damage, blood vessel damage, failure of the prosthesis, infection and up to and including death.  Patient understand the risks, benefits and expectations and wishes to proceed with surgery.   PCP: Glo Herring., MD  D/C Plans:      Home with HHPT  Post-op Meds:       No Rx given   Tranexamic Acid:      To be given - IV  Decadron:      Is to be given  FYI:     ASA post-op  Oxycodone post-op  NO HHPT - already given OPPT Rx    Patient Active Problem List   Diagnosis Date Noted  . Palpitations 05/23/2014  . Expected blood loss anemia 09/01/2013  . S/P right TKA 08/31/2013  . DJD (degenerative joint disease) of knee 08/31/2013  . Pre-operative clearance, cardiac 08/17/2013  . HTN (hypertension) 05/20/2013   . Hyperlipidemia 05/20/2013  . Migraine 02/23/2007  . ALLERGIC RHINITIS 02/23/2007  . TRACHEOBRONCHITIS 02/23/2007  . COUGH 02/23/2007   Past Medical History  Diagnosis Date  . Hyperlipidemia   . GERD (gastroesophageal reflux disease)   . Complication of anesthesia     patient states does not take much medicine to put her to sleep   . PONV (postoperative nausea and vomiting)   . Mitral valve prolapse     no symptoms   . Edema     ankles to knees   . Chronic kidney disease     idiopathic micro hematuria   . Headache(784.0)     hx of migraines   . Arthritis   . Anemia   . Scalp alopecia     Past Surgical History  Procedure Laterality Date  . Veins stripping       bilateral legs   . Tubal ligation  1986  . Cervical fusion  2006    cervical 5-6   . Total knee arthroplasty Right 08/31/2013    Procedure: RIGHT TOTAL KNEE ARTHROPLASTY;  Surgeon: [name], MD;  Location: [facility];  Service: Orthopedics;  Laterality: Right;  . Transthoracic echocardiogram  7/41/2878    LV SYSTOLIC FUNCTION NORMAL, EF=>55%, INSIGNIFICANT PERICARDIAL EFFUSION, LEFT ATRIAL SIZE IS NORMAL, RV SYSTOLIC PRESSURE IS NORMAL. NO SIGN VALVULAR HEART DISEASE.  Marland Kitchen Myocardial perfusion study  07/02/2011    NO SCINTIGRAPHIC EVIDENCE OF INDUCIBLE MYOCARDIAL ISCHEMIA. POST-STRESS  EF IS 87%.EXERCISE CAPACITY 9 METS. EKG SHOWS NSR AT 69. THERE IS 1MM HORIZONTAL ST DEPRESSION WITH EXERCISE IN II, III, AVF SEEN BEST AT EARLY RECOVERY. THIS IS PRESENT IN V3-V5 AS WELL AND RETURNS TO BASELINE AT THE END OF THE TEST. NORMAL STUDY.  . Renal artery doppler  12/28/2003    CELIAC ARTERY: AT REST, 168.4 CM/S; INSPRIATION, 177.6 CM/S. ARCUATE LIGAMENT COMPRESSION SYNDROME. R & L KIDNEYS: RIGHT KIDNEY MEASURES SOME WHAT SMALLER THAN LEFT. KIDNEY ARE ESSENTIALLY SYMMETRICAL IN SHAPE WITH NO OBVIOUS ABNORMALITY VISUALIZED. R & L RENAL ARTERIES: DEMONSTRATE NORMAL SPECTRA. NO SUGGESTION OF DIAMETER REDUCTION, DISSECTION,  FIBROMUSCULAR DYSPLASIA OR ANY OTHER VASCULAR ABNOR    No prescriptions prior to admission   Allergies  Allergen Reactions  . Metoprolol     rash  . Sporanox [Itraconazole] Swelling    Bilateral knee and ankle swelling and pain     Social History  Substance Use Topics  . Smoking status: Former Smoker    Types: Cigarettes    Quit date: 05/13/1988  . Smokeless tobacco: Never Used  . Alcohol Use: Yes     Comment: wine- rarely.    Family History  Problem Relation Age of Onset  . Dementia Mother   . Heart attack Father   . Heart disease Father   . Kidney failure Father   . Heart attack Brother     age 36  . Cancer - Other Brother     kidney     Review of Systems  Constitutional: Negative.   Eyes: Negative.   Respiratory: Negative.   Cardiovascular: Negative.   Gastrointestinal: Positive for heartburn.  Genitourinary: Negative.   Musculoskeletal: Positive for back pain and joint pain.  Skin: Negative.   Neurological: Positive for headaches.  Endo/Heme/Allergies: Positive for environmental allergies.  Psychiatric/Behavioral: Negative.     Objective:  Physical Exam  Constitutional: She is oriented to person, place, and time. She appears well-developed and well-nourished.  HENT:  Head: Normocephalic and atraumatic.  Eyes: Pupils are equal, round, and reactive to light.  Neck: Neck supple. No JVD present. No tracheal deviation present. No thyromegaly present.  Cardiovascular: Normal rate, regular rhythm, normal heart sounds and intact distal pulses.   Respiratory: Effort normal and breath sounds normal. No stridor. No respiratory distress. She has no wheezes.  GI: Soft. There is no tenderness. There is no guarding.  Musculoskeletal:       Left knee: She exhibits decreased range of motion, swelling and bony tenderness. She exhibits no ecchymosis, no deformity, no laceration and no erythema. Tenderness found.  Lymphadenopathy:    She has no cervical adenopathy.   Neurological: She is alert and oriented to person, place, and time.  Skin: Skin is warm and dry.  Psychiatric: She has a normal mood and affect.      Labs:  Estimated body mass index is 24.93 kg/(m^2) as calculated from the following:   Height as of 11/21/14: 5\' 5"  (1.651 m).   Weight as of 11/21/14: 67.949 kg (149 lb 12.8 oz).   Imaging Review Plain radiographs demonstrate severe degenerative joint disease of the left knee(s).  The bone quality appears to be good for age and reported activity level.  Assessment/Plan:  End stage arthritis, left knee   The patient history, physical examination, clinical judgment of the provider and imaging studies are consistent with end stage degenerative joint disease of the left knee(s) and total knee arthroplasty is deemed medically necessary. The treatment options including medical management, injection  therapy arthroscopy and arthroplasty were discussed at length. The risks and benefits of total knee arthroplasty were presented and reviewed. The risks due to aseptic loosening, infection, stiffness, patella tracking problems, thromboembolic complications and other imponderables were discussed. The patient acknowledged the explanation, agreed to proceed with the plan and consent was signed. Patient is being admitted for inpatient treatment for surgery, pain control, PT, OT, prophylactic antibiotics, VTE prophylaxis, progressive ambulation and ADL's and discharge planning. The patient is planning to be discharged home with home health services.    West Pugh Vontrell Pullman   PA-C  03/02/2015, 11:14 AM

## 2015-03-07 ENCOUNTER — Inpatient Hospital Stay (HOSPITAL_COMMUNITY): Payer: Medicare Other | Admitting: Anesthesiology

## 2015-03-07 ENCOUNTER — Encounter (HOSPITAL_COMMUNITY)
Admission: RE | Disposition: A | Discharge status: Home Health | Payer: Self-pay | Source: Ambulatory Visit | Attending: Orthopedic Surgery

## 2015-03-07 ENCOUNTER — Inpatient Hospital Stay (HOSPITAL_COMMUNITY)
Admission: RE | Admit: 2015-03-07 | Discharge: 2015-03-09 | DRG: 470 | Disposition: A | Discharge status: Home Health | Payer: Medicare Other | Source: Ambulatory Visit | Attending: Orthopedic Surgery | Admitting: Orthopedic Surgery

## 2015-03-07 ENCOUNTER — Encounter (HOSPITAL_COMMUNITY): Payer: Self-pay | Admitting: *Deleted

## 2015-03-07 DIAGNOSIS — M1712 Unilateral primary osteoarthritis, left knee: Secondary | ICD-10-CM | POA: Diagnosis present

## 2015-03-07 DIAGNOSIS — Z96651 Presence of right artificial knee joint: Secondary | ICD-10-CM | POA: Diagnosis present

## 2015-03-07 DIAGNOSIS — Z96652 Presence of left artificial knee joint: Secondary | ICD-10-CM

## 2015-03-07 DIAGNOSIS — M659 Synovitis and tenosynovitis, unspecified: Secondary | ICD-10-CM | POA: Diagnosis present

## 2015-03-07 DIAGNOSIS — Z79899 Other long term (current) drug therapy: Secondary | ICD-10-CM | POA: Diagnosis not present

## 2015-03-07 DIAGNOSIS — E785 Hyperlipidemia, unspecified: Secondary | ICD-10-CM | POA: Diagnosis present

## 2015-03-07 DIAGNOSIS — Z01812 Encounter for preprocedural laboratory examination: Secondary | ICD-10-CM | POA: Diagnosis not present

## 2015-03-07 DIAGNOSIS — Z87891 Personal history of nicotine dependence: Secondary | ICD-10-CM

## 2015-03-07 DIAGNOSIS — I10 Essential (primary) hypertension: Secondary | ICD-10-CM | POA: Diagnosis present

## 2015-03-07 DIAGNOSIS — Z6825 Body mass index (BMI) 25.0-25.9, adult: Secondary | ICD-10-CM

## 2015-03-07 DIAGNOSIS — K219 Gastro-esophageal reflux disease without esophagitis: Secondary | ICD-10-CM | POA: Diagnosis present

## 2015-03-07 DIAGNOSIS — E663 Overweight: Secondary | ICD-10-CM | POA: Diagnosis present

## 2015-03-07 DIAGNOSIS — M25562 Pain in left knee: Secondary | ICD-10-CM | POA: Diagnosis present

## 2015-03-07 DIAGNOSIS — Z96659 Presence of unspecified artificial knee joint: Secondary | ICD-10-CM

## 2015-03-07 DIAGNOSIS — Z981 Arthrodesis status: Secondary | ICD-10-CM | POA: Diagnosis not present

## 2015-03-07 HISTORY — PX: TOTAL KNEE ARTHROPLASTY: SHX125

## 2015-03-07 LAB — TYPE AND SCREEN
ABO/RH(D): O POS
Antibody Screen: NEGATIVE

## 2015-03-07 SURGERY — ARTHROPLASTY, KNEE, TOTAL
Anesthesia: Spinal | Site: Knee | Laterality: Left

## 2015-03-07 MED ORDER — POLYETHYLENE GLYCOL 3350 17 G PO PACK
17.0000 g | PACK | Freq: Two times a day (BID) | ORAL | Status: DC
  Administered 2015-03-07 – 2015-03-09 (×3): 17 g via ORAL

## 2015-03-07 MED ORDER — METOCLOPRAMIDE HCL 10 MG PO TABS: 5.0000 mg | ORAL_TABLET | Freq: Three times a day (TID) | ORAL | Status: DC | PRN

## 2015-03-07 MED ORDER — DEXAMETHASONE SODIUM PHOSPHATE 10 MG/ML IJ SOLN
10.0000 mg | Freq: Once | INTRAMUSCULAR | Status: AC
  Administered 2015-03-07: 10 mg via INTRAVENOUS

## 2015-03-07 MED ORDER — SODIUM CHLORIDE 0.9 % IJ SOLN
INTRAMUSCULAR | Status: DC | PRN
  Administered 2015-03-07: 30 mL

## 2015-03-07 MED ORDER — SODIUM CHLORIDE 0.9 % IV SOLN
10.0000 mg | INTRAVENOUS | Status: DC | PRN
  Administered 2015-03-07: 10 ug/min via INTRAVENOUS

## 2015-03-07 MED ORDER — MENTHOL 3 MG MT LOZG: 1.0000 | LOZENGE | OROMUCOSAL | Status: DC | PRN

## 2015-03-07 MED ORDER — OXYCODONE HCL 5 MG PO TABS
5.0000 mg | ORAL_TABLET | ORAL | Status: DC
  Administered 2015-03-07 – 2015-03-09 (×11): 10 mg via ORAL
  Filled 2015-03-07 (×4): qty 2
  Filled 2015-03-07: qty 3
  Filled 2015-03-07 (×6): qty 2

## 2015-03-07 MED ORDER — BUPIVACAINE-EPINEPHRINE (PF) 0.25% -1:200000 IJ SOLN
INTRAMUSCULAR | Status: AC
  Filled 2015-03-07: qty 30

## 2015-03-07 MED ORDER — PHENOL 1.4 % MT LIQD: 1.0000 | OROMUCOSAL | Status: DC | PRN

## 2015-03-07 MED ORDER — FENTANYL CITRATE (PF) 100 MCG/2ML IJ SOLN
INTRAMUSCULAR | Status: AC
  Filled 2015-03-07: qty 4

## 2015-03-07 MED ORDER — PROMETHAZINE HCL 25 MG/ML IJ SOLN: 6.2500 mg | INTRAMUSCULAR | Status: DC | PRN

## 2015-03-07 MED ORDER — KETOROLAC TROMETHAMINE 30 MG/ML IJ SOLN
INTRAMUSCULAR | Status: AC
  Filled 2015-03-07: qty 1

## 2015-03-07 MED ORDER — PROPOFOL 10 MG/ML IV BOLUS
INTRAVENOUS | Status: AC
  Filled 2015-03-07: qty 20

## 2015-03-07 MED ORDER — 0.9 % SODIUM CHLORIDE (POUR BTL) OPTIME
TOPICAL | Status: DC | PRN
  Administered 2015-03-07: 1000 mL

## 2015-03-07 MED ORDER — ROSUVASTATIN CALCIUM 40 MG PO TABS
40.0000 mg | ORAL_TABLET | Freq: Every evening | ORAL | Status: DC
  Administered 2015-03-07 – 2015-03-08 (×2): 40 mg via ORAL
  Filled 2015-03-07 (×3): qty 1

## 2015-03-07 MED ORDER — MAGNESIUM CITRATE PO SOLN: 1.0000 | Freq: Once | ORAL | Status: DC | PRN

## 2015-03-07 MED ORDER — METHOCARBAMOL 500 MG PO TABS
500.0000 mg | ORAL_TABLET | Freq: Four times a day (QID) | ORAL | Status: DC | PRN
  Filled 2015-03-07: qty 1

## 2015-03-07 MED ORDER — SODIUM CHLORIDE 0.9 % IV SOLN
1000.0000 mg | Freq: Once | INTRAVENOUS | Status: AC
  Administered 2015-03-07: 1000 mg via INTRAVENOUS
  Filled 2015-03-07: qty 10

## 2015-03-07 MED ORDER — BISACODYL 10 MG RE SUPP: 10.0000 mg | Freq: Every day | RECTAL | Status: DC | PRN

## 2015-03-07 MED ORDER — HYDROMORPHONE HCL 1 MG/ML IJ SOLN
INTRAMUSCULAR | Status: AC
  Filled 2015-03-07: qty 1

## 2015-03-07 MED ORDER — SODIUM CHLORIDE 0.9 % IJ SOLN
INTRAMUSCULAR | Status: AC
  Filled 2015-03-07: qty 50

## 2015-03-07 MED ORDER — HYDROMORPHONE HCL 1 MG/ML IJ SOLN
0.2500 mg | INTRAMUSCULAR | Status: DC | PRN
  Administered 2015-03-07: 0.5 mg via INTRAVENOUS

## 2015-03-07 MED ORDER — METOCLOPRAMIDE HCL 5 MG/ML IJ SOLN: 5.0000 mg | Freq: Three times a day (TID) | INTRAMUSCULAR | Status: DC | PRN

## 2015-03-07 MED ORDER — ONDANSETRON HCL 4 MG/2ML IJ SOLN
INTRAMUSCULAR | Status: AC
  Filled 2015-03-07: qty 2

## 2015-03-07 MED ORDER — METHOCARBAMOL 1000 MG/10ML IJ SOLN
500.0000 mg | Freq: Four times a day (QID) | INTRAVENOUS | Status: DC | PRN
  Administered 2015-03-07: 500 mg via INTRAVENOUS
  Filled 2015-03-07 (×2): qty 5

## 2015-03-07 MED ORDER — LORATADINE 10 MG PO TABS
10.0000 mg | ORAL_TABLET | Freq: Every day | ORAL | Status: DC
  Administered 2015-03-08 – 2015-03-09 (×2): 10 mg via ORAL
  Filled 2015-03-07 (×2): qty 1

## 2015-03-07 MED ORDER — AMLODIPINE BESYLATE 10 MG PO TABS
10.0000 mg | ORAL_TABLET | Freq: Every day | ORAL | Status: DC
  Administered 2015-03-08 – 2015-03-09 (×2): 10 mg via ORAL
  Filled 2015-03-07 (×2): qty 1

## 2015-03-07 MED ORDER — LACTATED RINGERS IV SOLN
INTRAVENOUS | Status: DC
  Administered 2015-03-07: 1000 mL via INTRAVENOUS

## 2015-03-07 MED ORDER — FERROUS SULFATE 325 (65 FE) MG PO TABS
325.0000 mg | ORAL_TABLET | Freq: Three times a day (TID) | ORAL | Status: DC
  Administered 2015-03-07 – 2015-03-09 (×5): 325 mg via ORAL
  Filled 2015-03-07 (×8): qty 1

## 2015-03-07 MED ORDER — BUPIVACAINE IN DEXTROSE 0.75-8.25 % IT SOLN
INTRATHECAL | Status: DC | PRN
  Administered 2015-03-07: 2 mL via INTRATHECAL

## 2015-03-07 MED ORDER — ONDANSETRON HCL 4 MG/2ML IJ SOLN
INTRAMUSCULAR | Status: DC | PRN
  Administered 2015-03-07: 4 mg via INTRAVENOUS

## 2015-03-07 MED ORDER — ONDANSETRON HCL 4 MG PO TABS: 4.0000 mg | ORAL_TABLET | Freq: Four times a day (QID) | ORAL | Status: DC | PRN

## 2015-03-07 MED ORDER — DEXAMETHASONE SODIUM PHOSPHATE 10 MG/ML IJ SOLN
INTRAMUSCULAR | Status: AC
  Filled 2015-03-07: qty 1

## 2015-03-07 MED ORDER — CELECOXIB 200 MG PO CAPS
200.0000 mg | ORAL_CAPSULE | Freq: Two times a day (BID) | ORAL | Status: DC
  Administered 2015-03-07 – 2015-03-09 (×4): 200 mg via ORAL
  Filled 2015-03-07 (×5): qty 1

## 2015-03-07 MED ORDER — KETOROLAC TROMETHAMINE 30 MG/ML IJ SOLN
INTRAMUSCULAR | Status: DC | PRN
  Administered 2015-03-07: 30 mg via INTRAVENOUS

## 2015-03-07 MED ORDER — BUPIVACAINE-EPINEPHRINE (PF) 0.25% -1:200000 IJ SOLN
INTRAMUSCULAR | Status: DC | PRN
  Administered 2015-03-07: 30 mL

## 2015-03-07 MED ORDER — ONDANSETRON HCL 4 MG/2ML IJ SOLN: 4.0000 mg | Freq: Four times a day (QID) | INTRAMUSCULAR | Status: DC | PRN

## 2015-03-07 MED ORDER — SODIUM CHLORIDE 0.9 % IV SOLN
INTRAVENOUS | Status: DC
  Administered 2015-03-07 – 2015-03-08 (×2): via INTRAVENOUS
  Filled 2015-03-07 (×7): qty 1000

## 2015-03-07 MED ORDER — CEFAZOLIN SODIUM-DEXTROSE 2-3 GM-% IV SOLR
2.0000 g | INTRAVENOUS | Status: AC
  Administered 2015-03-07: 2 g via INTRAVENOUS

## 2015-03-07 MED ORDER — SODIUM CHLORIDE 0.9 % IR SOLN
Status: DC | PRN
  Administered 2015-03-07: 1000 mL

## 2015-03-07 MED ORDER — MIDAZOLAM HCL 2 MG/2ML IJ SOLN
INTRAMUSCULAR | Status: AC
  Filled 2015-03-07: qty 4

## 2015-03-07 MED ORDER — PHENYLEPHRINE HCL 10 MG/ML IJ SOLN
INTRAMUSCULAR | Status: AC
  Filled 2015-03-07: qty 1

## 2015-03-07 MED ORDER — MIDAZOLAM HCL 5 MG/5ML IJ SOLN
INTRAMUSCULAR | Status: DC | PRN
Start: 2015-03-07 — End: 2015-03-07
  Administered 2015-03-07 (×2): 1 mg via INTRAVENOUS

## 2015-03-07 MED ORDER — SUMATRIPTAN SUCCINATE 25 MG PO TABS: 25.0000 mg | ORAL_TABLET | ORAL | Status: DC | PRN

## 2015-03-07 MED ORDER — FENTANYL CITRATE (PF) 100 MCG/2ML IJ SOLN
INTRAMUSCULAR | Status: DC | PRN
  Administered 2015-03-07: 50 ug via INTRAVENOUS

## 2015-03-07 MED ORDER — PROPOFOL 500 MG/50ML IV EMUL
INTRAVENOUS | Status: DC | PRN
  Administered 2015-03-07: 25 ug/kg/min via INTRAVENOUS

## 2015-03-07 MED ORDER — DIPHENHYDRAMINE HCL 25 MG PO CAPS: 25.0000 mg | ORAL_CAPSULE | Freq: Four times a day (QID) | ORAL | Status: DC | PRN

## 2015-03-07 MED ORDER — CEFAZOLIN SODIUM-DEXTROSE 2-3 GM-% IV SOLR
2.0000 g | Freq: Four times a day (QID) | INTRAVENOUS | Status: AC
  Administered 2015-03-07 – 2015-03-08 (×2): 2 g via INTRAVENOUS
  Filled 2015-03-07 (×2): qty 50

## 2015-03-07 MED ORDER — DOCUSATE SODIUM 100 MG PO CAPS
100.0000 mg | ORAL_CAPSULE | Freq: Two times a day (BID) | ORAL | Status: DC
  Administered 2015-03-07 – 2015-03-09 (×4): 100 mg via ORAL

## 2015-03-07 MED ORDER — ASPIRIN EC 325 MG PO TBEC
325.0000 mg | DELAYED_RELEASE_TABLET | Freq: Two times a day (BID) | ORAL | Status: DC
Start: 2015-03-08 — End: 2015-03-09
  Administered 2015-03-08 – 2015-03-09 (×3): 325 mg via ORAL
  Filled 2015-03-07 (×5): qty 1

## 2015-03-07 MED ORDER — ALUM & MAG HYDROXIDE-SIMETH 200-200-20 MG/5ML PO SUSP: 30.0000 mL | ORAL | Status: DC | PRN

## 2015-03-07 MED ORDER — FLUTICASONE PROPIONATE 50 MCG/ACT NA SUSP: 2.0000 | Freq: Every day | NASAL | Status: DC | PRN

## 2015-03-07 MED ORDER — HYDROMORPHONE HCL 1 MG/ML IJ SOLN: 0.5000 mg | INTRAMUSCULAR | Status: DC | PRN

## 2015-03-07 MED ORDER — CEFAZOLIN SODIUM-DEXTROSE 2-3 GM-% IV SOLR
INTRAVENOUS | Status: AC
  Filled 2015-03-07: qty 50

## 2015-03-07 MED ORDER — DEXAMETHASONE SODIUM PHOSPHATE 10 MG/ML IJ SOLN
10.0000 mg | Freq: Once | INTRAMUSCULAR | Status: AC
  Administered 2015-03-08: 10 mg via INTRAVENOUS
  Filled 2015-03-07: qty 1

## 2015-03-07 SURGICAL SUPPLY — 55 items
BAG DECANTER FOR FLEXI CONT (MISCELLANEOUS) IMPLANT
BAG SPEC THK2 15X12 ZIP CLS (MISCELLANEOUS)
BAG ZIPLOCK 12X15 (MISCELLANEOUS) IMPLANT
BANDAGE ELASTIC 6 VELCRO ST LF (GAUZE/BANDAGES/DRESSINGS) ×3 IMPLANT
BANDAGE ESMARK 6X9 LF (GAUZE/BANDAGES/DRESSINGS) ×1 IMPLANT
BLADE SAW SGTL 13.0X1.19X90.0M (BLADE) ×3 IMPLANT
BNDG CMPR 9X6 STRL LF SNTH (GAUZE/BANDAGES/DRESSINGS) ×1
BNDG ESMARK 6X9 LF (GAUZE/BANDAGES/DRESSINGS) ×3
BOWL SMART MIX CTS (DISPOSABLE) ×3 IMPLANT
CAPT KNEE TOTAL 3 ATTUNE ×2 IMPLANT
CEMENT HV SMART SET (Cement) ×4 IMPLANT
CUFF TOURN SGL QUICK 34 (TOURNIQUET CUFF) ×3
CUFF TRNQT CYL 34X4X40X1 (TOURNIQUET CUFF) ×1 IMPLANT
DECANTER SPIKE VIAL GLASS SM (MISCELLANEOUS) ×3 IMPLANT
DRAPE EXTREMITY T 121X128X90 (DRAPE) ×3 IMPLANT
DRAPE POUCH INSTRU U-SHP 10X18 (DRAPES) ×3 IMPLANT
DRAPE U-SHAPE 47X51 STRL (DRAPES) ×3 IMPLANT
DRSG AQUACEL AG ADV 3.5X10 (GAUZE/BANDAGES/DRESSINGS) ×3 IMPLANT
DURAPREP 26ML APPLICATOR (WOUND CARE) ×6 IMPLANT
ELECT REM PT RETURN 9FT ADLT (ELECTROSURGICAL) ×3
ELECTRODE REM PT RTRN 9FT ADLT (ELECTROSURGICAL) ×1 IMPLANT
FACESHIELD WRAPAROUND (MASK) ×15 IMPLANT
FACESHIELD WRAPAROUND OR TEAM (MASK) ×5 IMPLANT
GLOVE BIOGEL PI IND STRL 7.5 (GLOVE) ×1 IMPLANT
GLOVE BIOGEL PI IND STRL 8.5 (GLOVE) ×1 IMPLANT
GLOVE BIOGEL PI INDICATOR 7.5 (GLOVE) ×2
GLOVE BIOGEL PI INDICATOR 8.5 (GLOVE) ×2
GLOVE ECLIPSE 8.0 STRL XLNG CF (GLOVE) ×3 IMPLANT
GLOVE ORTHO TXT STRL SZ7.5 (GLOVE) ×6 IMPLANT
GOWN SPEC L3 XXLG W/TWL (GOWN DISPOSABLE) ×3 IMPLANT
GOWN STRL REUS W/TWL LRG LVL3 (GOWN DISPOSABLE) ×3 IMPLANT
HANDPIECE INTERPULSE COAX TIP (DISPOSABLE) ×3
KIT BASIN OR (CUSTOM PROCEDURE TRAY) ×3 IMPLANT
LIQUID BAND (GAUZE/BANDAGES/DRESSINGS) ×3 IMPLANT
MANIFOLD NEPTUNE II (INSTRUMENTS) ×3 IMPLANT
NDL SAFETY ECLIPSE 18X1.5 (NEEDLE) ×1 IMPLANT
NEEDLE HYPO 18GX1.5 SHARP (NEEDLE) ×3
PACK TOTAL JOINT (CUSTOM PROCEDURE TRAY) ×3 IMPLANT
PEN SKIN MARKING BROAD (MISCELLANEOUS) ×3 IMPLANT
POSITIONER SURGICAL ARM (MISCELLANEOUS) ×3 IMPLANT
SET HNDPC FAN SPRY TIP SCT (DISPOSABLE) ×1 IMPLANT
SET PAD KNEE POSITIONER (MISCELLANEOUS) ×3 IMPLANT
SUCTION FRAZIER 12FR DISP (SUCTIONS) ×3 IMPLANT
SUT MNCRL AB 4-0 PS2 18 (SUTURE) ×3 IMPLANT
SUT VIC AB 1 CT1 36 (SUTURE) ×3 IMPLANT
SUT VIC AB 2-0 CT1 27 (SUTURE) ×9
SUT VIC AB 2-0 CT1 TAPERPNT 27 (SUTURE) ×3 IMPLANT
SUT VLOC 180 0 24IN GS25 (SUTURE) ×3 IMPLANT
SYR 50ML LL SCALE MARK (SYRINGE) ×3 IMPLANT
TOWEL OR 17X26 10 PK STRL BLUE (TOWEL DISPOSABLE) ×3 IMPLANT
TOWEL OR NON WOVEN STRL DISP B (DISPOSABLE) IMPLANT
TRAY FOLEY W/METER SILVER 14FR (SET/KITS/TRAYS/PACK) ×3 IMPLANT
WATER STERILE IRR 1500ML POUR (IV SOLUTION) ×3 IMPLANT
WRAP KNEE MAXI GEL POST OP (GAUZE/BANDAGES/DRESSINGS) ×3 IMPLANT
YANKAUER SUCT BULB TIP 10FT TU (MISCELLANEOUS) ×3 IMPLANT

## 2015-03-07 NOTE — Transfer of Care (Signed)
Immediate Anesthesia Transfer of Care Note  Patient: Mary [name]  Procedure(s) Performed: Procedure(s): TOTAL KNEE ARTHROPLASTY (Left)  Patient Location: PACU  Anesthesia Type:Spinal  Level of Consciousness: awake, alert  and oriented  Airway & Oxygen Therapy: Patient Spontanous Breathing and Patient connected to face mask oxygen  Post-op Assessment: Report given to RN and Post -op Vital signs reviewed and stable  Post vital signs: Reviewed and stable  Last Vitals:  Filed Vitals:   03/07/15 0703  BP: 128/63  Pulse: 75  Temp: 36.4 C  Resp: 16    Complications: No apparent anesthesia complications

## 2015-03-07 NOTE — Plan of Care (Signed)
Problem: Consults Goal: Diagnosis- Total Joint Replacement Left total knee     

## 2015-03-07 NOTE — Interval H&P Note (Signed)
History and Physical Interval Note:  03/07/2015 9:00 AM  Mary [name]  has presented today for surgery, with the diagnosis of LEFT KNEE OA  The various methods of treatment have been discussed with the patient and family. After consideration of risks, benefits and other options for treatment, the patient has consented to  Procedure(s): TOTAL KNEE ARTHROPLASTY (Left) as a surgical intervention .  The patient's history has been reviewed, patient examined, no change in status, stable for surgery.  I have reviewed the patient's chart and labs.  Questions were answered to the patient's satisfaction.     [name]

## 2015-03-07 NOTE — Progress Notes (Signed)
Utilization review completed.  

## 2015-03-07 NOTE — Anesthesia Procedure Notes (Addendum)
Spinal Patient location during procedure: OR Staffing Anesthesiologist: Franne Grip Performed by: anesthesiologist  Preanesthetic Checklist Completed: patient identified, site marked, surgical consent, pre-op evaluation, timeout performed, IV checked, risks and benefits discussed and monitors and equipment checked Spinal Block Patient position: sitting Prep: Betadine Patient monitoring: heart rate, continuous pulse ox and blood pressure Approach: midline Location: L3-4 Injection technique: single-shot Needle Needle type: Sprotte  Needle gauge: 25 G Needle length: 9 cm Additional Notes Expiration date of kit checked and confirmed. Patient tolerated procedure well, without complications. CSF clear. No paresthesia. Meaningful verbal contact maintained throughout spinal placement.    Procedure Name: MAC Date/Time: 03/07/2015 10:25 AM Performed by: Kyung Rudd Pre-anesthesia Checklist: Patient identified, Emergency Drugs available, Suction available, Patient being monitored and Timeout performed Patient Re-evaluated:Patient Re-evaluated prior to inductionOxygen Delivery Method: Simple face mask Intubation Type: IV induction Placement Confirmation: positive ETCO2

## 2015-03-07 NOTE — Discharge Instructions (Signed)

## 2015-03-07 NOTE — Evaluation (Signed)
Physical Therapy Evaluation Patient Details Name: Mary [name] MRN: [id number] DOB: 10-19-1950 Today's Date: 03/07/2015   History of Present Illness  64 yo female s/p L TKA 03/07/15.   Clinical Impression  On eval POD 0, pt required Min assist for mobility-walked ~50 feet with RW. Pain rated 7/10.     Follow Up Recommendations Home health PT    Equipment Recommendations   (pt states she already has a walker)    Recommendations for Other Services       Precautions / Restrictions Precautions Precautions: Knee Restrictions Weight Bearing Restrictions: No LLE Weight Bearing: Weight bearing as tolerated      Mobility  Bed Mobility Overal bed mobility: Needs Assistance Bed Mobility: Supine to Sit     Supine to sit: Min assist     General bed mobility comments: Assist for L LE.   Transfers Overall transfer level: Needs assistance Equipment used: Rolling walker (2 wheeled) Transfers: Sit to/from Stand Sit to Stand: Min assist         General transfer comment: Assist to rise, stabilize, control descent. VCs safety, technique, hand placement  Ambulation/Gait Ambulation/Gait assistance: Min assist Ambulation Distance (Feet): 50 Feet Assistive device: Rolling walker (2 wheeled) Gait Pattern/deviations: Step-to pattern;Antalgic     General Gait Details: assist to stabilize. Mild intermittent buckling L knee noted. VCs safety, sequence.   Stairs            Wheelchair Mobility    Modified Rankin (Stroke Patients Only)       Balance                                             Pertinent Vitals/Pain Pain Assessment: 0-10 Pain Score: 7  Pain Location: L knee Pain Descriptors / Indicators: Aching;Sore Pain Intervention(s): Monitored during session;Ice applied;Repositioned    Home Living Family/patient expects to be discharged to:: Private residence Living Arrangements: Spouse/significant other Available Help at Discharge:  Family Type of Home: House Home Access: Stairs to enter Entrance Stairs-Rails: None Entrance Stairs-Number of Steps: 2 Home Layout: Two level;Able to live on main level with bedroom/bathroom Home Equipment: Bedside commode;Walker - 4 wheels      Prior Function Level of Independence: Independent               Hand Dominance        Extremity/Trunk Assessment   Upper Extremity Assessment: Defer to OT evaluation           Lower Extremity Assessment: LLE deficits/detail   LLE Deficits / Details: able to move ankle well. hip abd/add 2/5. (limited by pain)  Cervical / Trunk Assessment: Normal  Communication   Communication: No difficulties  Cognition Arousal/Alertness: Awake/alert Behavior During Therapy: WFL for tasks assessed/performed Overall Cognitive Status: Within Functional Limits for tasks assessed                      General Comments      Exercises        Assessment/Plan    PT Assessment Patient needs continued PT services  PT Diagnosis Difficulty walking;Acute pain   PT Problem List Decreased strength;Decreased range of motion;Decreased activity tolerance;Decreased balance;Decreased mobility;Decreased knowledge of use of DME;Pain  PT Treatment Interventions DME instruction;Gait training;Stair training;Functional mobility training;Therapeutic activities;Patient/family education;Balance training;Therapeutic exercise   PT Goals (Current goals can be found in the Care Plan section) Acute  Rehab PT Goals Patient Stated Goal: home PT Goal Formulation: With patient Time For Goal Achievement: 03/14/15 Potential to Achieve Goals: Good    Frequency 7X/week   Barriers to discharge        Co-evaluation               End of Session Equipment Utilized During Treatment: Gait belt Activity Tolerance: Patient tolerated treatment well Patient left: in chair;with call bell/phone within reach;with family/visitor present           Time:  1652-1710 PT Time Calculation (min) (ACUTE ONLY): 18 min   Charges:   PT Evaluation $Initial PT Evaluation Tier I: 1 Procedure     PT G Codes:        Weston Anna, MPT Pager: [phone]

## 2015-03-07 NOTE — Anesthesia Postprocedure Evaluation (Signed)
  Anesthesia Post-op Note  Patient: Mary [name]  Procedure(s) Performed: Procedure(s) (LRB): TOTAL KNEE ARTHROPLASTY (Left)  Patient Location: PACU  Anesthesia Type: Spinal  Level of Consciousness: awake and alert   Airway and Oxygen Therapy: Patient Spontanous Breathing  Post-op Pain: mild  Post-op Assessment: Post-op Vital signs reviewed, Patient's Cardiovascular Status Stable, Respiratory Function Stable, Patent Airway and No signs of Nausea or vomiting. Spinal level receded three levels in PACU.  Last Vitals:  Filed Vitals:   03/07/15 1330  BP: 120/66  Pulse:   Temp: 36.7 C  Resp: 16    Post-op Vital Signs: stable   Complications: No apparent anesthesia complications

## 2015-03-07 NOTE — Op Note (Signed)
NAME:  CHRISTABEL CAMIRE                      MEDICAL RECORD NO.:  [id number]                             FACILITY:  [facility]      PHYSICIAN:  Pietro Cassis. Alvan Dame, M.D.  DATE OF BIRTH:  01-10-1951      DATE OF PROCEDURE:  03/07/2015                                     OPERATIVE REPORT         PREOPERATIVE DIAGNOSIS:  Left knee osteoarthritis.      POSTOPERATIVE DIAGNOSIS:  Left knee osteoarthritis.      FINDINGS:  The patient was noted to have complete loss of cartilage and   bone-on-bone arthritis with associated osteophytes in all three compartments of   the knee with a significant synovitis and associated effusion.      PROCEDURE:  Left total knee replacement.      COMPONENTS USED:  DePuy Attune rotating platform posterior stabilized knee   system, a size 4 standard femur, 4 tibia, size 7 mm AOX PS insert, and 35 anatomic patellar   button.      SURGEON:  Pietro Cassis. Alvan Dame, M.D.      ASSISTANT:  Danae Orleans, PA-C.      ANESTHESIA:  Spinal.      SPECIMENS:  None.      COMPLICATION:  None.      DRAINS:   None.  EBL: <50cc      TOURNIQUET TIME:   Total Tourniquet Time Documented: Thigh (Left) - 24 minutes Total: Thigh (Left) - 24 minutes  .      The patient was stable to the recovery room.      INDICATION FOR PROCEDURE:  AINA ROSSBACH is a 64 y.o. female patient of   mine.  The patient had been seen, evaluated, and treated conservatively in the   office with medication, activity modification, and injections.  The patient had   radiographic changes of bone-on-bone arthritis with endplate sclerosis and osteophytes noted.      The patient failed conservative measures including medication, injections, and activity modification, and at this point was ready for more definitive measures.   Based on the radiographic changes and failed conservative measures, the patient   decided to proceed with total knee replacement.  Risks of infection,   DVT, component failure, need for  revision surgery, postop course, and   expectations were all   discussed and reviewed.  Consent was obtained for benefit of pain   relief.      PROCEDURE IN DETAIL:  The patient was brought to the operative theater.   Once adequate anesthesia, preoperative antibiotics, 2 gm of Ancef, 1 gm Tranexamic Acid, and 10 mg Decadron administered, the patient was positioned supine with the left thigh tourniquet placed.  The left lower extremity was prepped and draped in sterile fashion.  A time-   out was performed identifying the patient, planned procedure, and   extremity.      The left lower extremity was placed in the [facility] leg holder.  The leg was   exsanguinated, tourniquet elevated to 250 mmHg.  A midline incision was   made followed by median  parapatellar arthrotomy.  Following initial   exposure, attention was first directed to the patella.  Precut   measurement was noted to be 21 mm.  I resected down to 13-14 mm and used a   35 patellar button to restore patellar height as well as cover the cut   surface.      The lug holes were drilled and a metal shim was placed to protect the   patella from retractors and saw blades.      At this point, attention was now directed to the femur.  The femoral   canal was opened with a drill, irrigated to try to prevent fat emboli.  An   intramedullary rod was passed at 3 degrees valgus, 9 mm of bone was   resected off the distal femur.  Following this resection, the tibia was   subluxated anteriorly.  Using the extramedullary guide, 2 mm of bone was resected off   the proximal medial tibia.  We confirmed the gap would be   stable medially and laterally with a size 6 mm insert as well as confirmed   the cut was perpendicular in the coronal plane, checking with an alignment rod.      Once this was done, I sized the femur to be a size 4 in the anterior-   posterior dimension, chose a standard component based on medial and   lateral dimension.  The size  4 rotation block was then pinned in   position anterior referenced using the C-clamp to set rotation.  The   anterior, posterior, and  chamfer cuts were made without difficulty nor   notching making certain that I was along the anterior cortex to help   with flexion gap stability.      The final box cut was made off the lateral aspect of distal femur.      At this point, the tibia was sized to be a size 4, the size 4 tray was   then pinned in position through the medial third of the tubercle,   drilled, and keel punched.  Trial reduction was now carried with a 4 femur,  4 tibia, a size 7 mm insert, and the 35 patella botton.  The knee was brought to   extension, full extension with good flexion stability with the patella   tracking through the trochlea without application of pressure.  Given   all these findings, the trial components removed.  Final components were   opened and cement was mixed.  The knee was irrigated with normal saline   solution and pulse lavage.  The synovial lining was   then injected with 30 cc of 0.25% Marcaine with epinephrine and 1 cc of Toradol plus 30 cc of NS for a    total of 61 cc.      The knee was irrigated.  Final implants were then cemented onto clean and   dried cut surfaces of bone with the knee brought to extension with a size 7 mm trial insert.      Once the cement had fully cured, the excess cement was removed   throughout the knee.  I confirmed I was satisfied with the range of   motion and stability, and the final size 7 mm PS AOX insert was chosen.  It was   placed into the knee.      The tourniquet had been let down at 23 minutes.  No significant   hemostasis required.  The  extensor mechanism was then reapproximated using #1 Vicryl and #0 V-lock sutures with the knee   in flexion.  The   remaining wound was closed with 2-0 Vicryl and running 4-0 Monocryl.   The knee was cleaned, dried, dressed sterilely using Dermabond and   Aquacel  dressing.  The patient was then   brought to recovery room in stable condition, tolerating the procedure   well.   Please note that Physician Assistant, Danae Orleans, PA-C, was present for the entirety of the case, and was utilized for pre-operative positioning, peri-operative retractor management, general facilitation of the procedure.  He was also utilized for primary wound closure at the end of the case.              Pietro Cassis Alvan Dame, M.D.    03/07/2015 11:29 AM

## 2015-03-07 NOTE — Anesthesia Preprocedure Evaluation (Addendum)
Anesthesia Evaluation  Patient identified by MRN, date of birth, ID band Patient awake    Reviewed: Allergy & Precautions, NPO status , Patient's Chart, lab work & pertinent test results  History of Anesthesia Complications (+) PONV and history of anesthetic complications  Airway Mallampati: II  TM Distance: >3 FB Neck ROM: Full    Dental no notable dental hx. (+) Teeth Intact   Pulmonary neg pulmonary ROS, former smoker,    Pulmonary exam normal breath sounds clear to auscultation       Cardiovascular hypertension, Pt. on medications Normal cardiovascular exam Rhythm:Regular Rate:Normal     Neuro/Psych  Headaches, negative psych ROS   GI/Hepatic Neg liver ROS, GERD  ,  Endo/Other  negative endocrine ROS  Renal/GU Renal disease  negative genitourinary   Musculoskeletal  (+) Arthritis ,   Abdominal   Peds negative pediatric ROS (+)  Hematology  (+) anemia ,   Anesthesia Other Findings   Reproductive/Obstetrics negative OB ROS                           Anesthesia Physical Anesthesia Plan  ASA: II  Anesthesia Plan: Spinal   Post-op Pain Management:    Induction: Intravenous  Airway Management Planned: Natural Airway  Additional Equipment:   Intra-op Plan:   Post-operative Plan: Extubation in OR  Informed Consent: I have reviewed the patients History and Physical, chart, labs and discussed the procedure including the risks, benefits and alternatives for the proposed anesthesia with the patient or authorized representative who has indicated his/her understanding and acceptance.   Dental advisory given  Plan Discussed with: CRNA  Anesthesia Plan Comments: (Discussed risks and benefits of and differences between spinal and general. Discussed risks of spinal including headache, backache, failure, bleeding and hematoma, infection, and nerve damage. Patient consents to spinal.  Questions answered. Coagulation studies and platelet count acceptable. )       Anesthesia Quick Evaluation

## 2015-03-07 NOTE — Anesthesia Postprocedure Evaluation (Deleted)
  Anesthesia Post-op Note  Patient: Mary [name]  Procedure(s) Performed: Procedure(s) (LRB): TOTAL KNEE ARTHROPLASTY (Left)  Patient Location: PACU  Anesthesia Type: Spinal  Level of Consciousness: awake and alert   Airway and Oxygen Therapy: Patient Spontanous Breathing  Post-op Pain: mild  Post-op Assessment: Post-op Vital signs reviewed, Patient's Cardiovascular Status Stable, Respiratory Function Stable, Patent Airway and No signs of Nausea or vomiting. Purposeful movement of both legs in PACU.   Last Vitals:  Filed Vitals:   03/07/15 1329  BP: 120/66  Pulse: 73  Temp: 36.7 C  Resp:     Post-op Vital Signs: stable   Complications: No apparent anesthesia complications

## 2015-03-08 LAB — BASIC METABOLIC PANEL
ANION GAP: 6 (ref 5–15)
BUN: 19 mg/dL (ref 6–20)
CHLORIDE: 109 mmol/L (ref 101–111)
CO2: 24 mmol/L (ref 22–32)
CREATININE: 0.89 mg/dL (ref 0.44–1.00)
Calcium: 8.4 mg/dL — ABNORMAL LOW (ref 8.9–10.3)
GFR calc non Af Amer: >60 mL/min (ref >60)
Glucose, Bld: 166 mg/dL — ABNORMAL HIGH (ref 65–99)
POTASSIUM: 4.8 mmol/L (ref 3.5–5.1)
SODIUM: 139 mmol/L (ref 135–145)

## 2015-03-08 LAB — CBC
HEMATOCRIT: 31.9 % — AB (ref 36.0–46.0)
HEMOGLOBIN: 10.3 g/dL — AB (ref 12.0–15.0)
MCH: 28.1 pg (ref 26.0–34.0)
MCHC: 32.3 g/dL (ref 30.0–36.0)
MCV: 87.2 fL (ref 78.0–100.0)
Platelets: 204 10*3/uL (ref 150–400)
RBC: 3.66 MIL/uL — AB (ref 3.87–5.11)
RDW: 16.2 % — ABNORMAL HIGH (ref 11.5–15.5)
WBC: 12.7 10*3/uL — AB (ref 4.0–10.5)

## 2015-03-08 MED ORDER — CYCLOBENZAPRINE HCL 5 MG PO TABS: 5.0000 mg | ORAL_TABLET | Freq: Three times a day (TID) | ORAL | Status: DC | PRN

## 2015-03-08 NOTE — Evaluation (Addendum)
Occupational Therapy Evaluation Patient Details Name: Mary [name] MRN: [id number] DOB: [birth year] Today's Date: 03/08/2015    History of Present Illness 64 yo female s/p L TKA 03/07/15.    Clinical Impression   Pt doing well. Up to practice 3in1 transfer with walker. Began education on shower transfer and will benefit from practice before d/c. Will follow to progress ADL independence for d/c home with spouse. Discussed AE options and pt states she probably still has some AE but didn't end up using it last time. Pt can reach to R foot but with effort and some discomfort.     Follow Up Recommendations  No OT follow up;Supervision/Assistance - 24 hour    Equipment Recommendations  None recommended by OT    Recommendations for Other Services       Precautions / Restrictions Precautions Precautions: Knee Restrictions Weight Bearing Restrictions: No LLE Weight Bearing: Weight bearing as tolerated      Mobility Bed Mobility               General bed mobility comments: in chair.   Transfers Overall transfer level: Needs assistance Equipment used: Rolling walker (2 wheeled) Transfers: Sit to/from Stand Sit to Stand: Min guard         General transfer comment: cues for hand placement and LE management.    Balance                                            ADL Overall ADL's : Needs assistance/impaired Eating/Feeding: Independent;Sitting   Grooming: Wash/dry hands;Min guard;Standing   Upper Body Bathing: Set up;Sitting   Lower Body Bathing: Minimal assistance;Sit to/from stand   Upper Body Dressing : Set up;Sitting   Lower Body Dressing: Minimal assistance;Sit to/from stand   Toilet Transfer: Min guard;Ambulation;BSC;RW   Toileting- Water quality scientist and Hygiene: Min guard;Sit to/from stand         General ADL Comments: Min cues for hand placement required for functional transfers. Also min cues for slower with turns and  facing sink with walker when she washes her hands to avoid twisting on her knee. Pt able to put more weight on L LE as session progressed. Pt asking about using a 4 wheel walker so informed assigned PTA regarding her inquiry. Discussed proper height of 3in1 and also use of 3in1 as shower chair but pt states her built in seat worked well last time and she ahs enough room to reach back for edge of seat.      Vision     Perception     Praxis      Pertinent Vitals/Pain Pain Assessment: 0-10 Pain Score: 4  Pain Location: L knee Pain Descriptors / Indicators: Sore Pain Intervention(s): Repositioned;Ice applied     Hand Dominance     Extremity/Trunk Assessment Upper Extremity Assessment Upper Extremity Assessment: Overall WFL for tasks assessed           Communication Communication Communication: No difficulties   Cognition Arousal/Alertness: Awake/alert Behavior During Therapy: WFL for tasks assessed/performed Overall Cognitive Status: Within Functional Limits for tasks assessed                     General Comments       Exercises       Shoulder Instructions      Home Living Family/patient expects to be discharged to:: Private residence Living  Arrangements: Spouse/significant other Available Help at Discharge: Family Type of Home: House Home Access: Stairs to enter [employer] of Steps: 2 Entrance Stairs-Rails: None Home Layout: Two level;Able to live on main level with bedroom/bathroom     Bathroom Shower/Tub: Occupational psychologist: Standard     Home Equipment: Bedside commode;Walker - 4 wheels          Prior Functioning/Environment Level of Independence: Independent             OT Diagnosis: Generalized weakness   OT Problem List: Decreased strength;Decreased knowledge of use of DME or AE   OT Treatment/Interventions: Self-care/ADL training;Patient/family education;Therapeutic activities;DME and/or AE instruction     OT Goals(Current goals can be found in the care plan section) Acute Rehab OT Goals Patient Stated Goal: home OT Goal Formulation: With patient Time For Goal Achievement: 03/15/15 Potential to Achieve Goals: Good  OT Frequency: Min 2X/week   Barriers to D/C:            Co-evaluation              End of Session Equipment Utilized During Treatment: Rolling walker  Activity Tolerance: Patient tolerated treatment well Patient left: in chair;with call bell/phone within reach   Time: 0917-0940 OT Time Calculation (min): 23 min Charges:  OT General Charges $OT Visit: 1 Procedure OT Evaluation $Initial OT Evaluation Tier I: 1 Procedure OT Treatments $Therapeutic Activity: 8-22 mins G-Codes:    Jules Schick  [phone] 03/08/2015, 9:58 AM

## 2015-03-08 NOTE — Plan of Care (Signed)
Problem: Phase II Progression Outcomes Goal: Discharge plan established Outcome: Completed/Met Date Met:  03/08/15 Pt plans to go home.

## 2015-03-08 NOTE — Progress Notes (Signed)
Physical Therapy Treatment Patient Details Name: Mary [name] MRN: [id number] DOB: 01-31-1951 Today's Date: 03/08/2015    History of Present Illness 64 yo female s/p L TKA 03/07/15.     PT Comments    POD # 1 pm session.  Assisted OOB to amb to bathroom then in hallway.  Returned to room then performed sitting TKR TE's.  Assisted back to bed and applied ICE.    Follow Up Recommendations  Home health PT     Equipment Recommendations  Rolling walker with 5" wheels    Recommendations for Other Services       Precautions / Restrictions Precautions Precautions: Knee Restrictions Weight Bearing Restrictions: No LLE Weight Bearing: Weight bearing as tolerated    Mobility  Bed Mobility Overal bed mobility: Needs Assistance Bed Mobility: Supine to Sit     Supine to sit: Supervision     General bed mobility comments: in creased time  Transfers Overall transfer level: Needs assistance Equipment used: Rolling walker (2 wheeled) Transfers: Sit to/from Stand Sit to Stand: Supervision         General transfer comment: one VC for hand placement  Ambulation/Gait Ambulation/Gait assistance: Min guard;Supervision Ambulation Distance (Feet): 105 Feet Assistive device: Rolling walker (2 wheeled)       General Gait Details: increased time and one VC safety with turns   Financial trader Rankin (Stroke Patients Only)       Balance                                    Cognition Arousal/Alertness: Awake/alert Behavior During Therapy: WFL for tasks assessed/performed Overall Cognitive Status: Within Functional Limits for tasks assessed                      Exercises  sitting 10 reps knee beds 5   reps assisted knee bends 10 reps LAQ's    General Comments        Pertinent Vitals/Pain Pain Assessment: 0-10 Pain Score: 6  Pain Location: L knee Pain Descriptors / Indicators: Sore;Tender Pain  Intervention(s): Monitored during session;Premedicated before session;Repositioned;Ice applied    Home Living                      Prior Function            PT Goals (current goals can now be found in the care plan section) Progress towards PT goals: Progressing toward goals    Frequency  7X/week    PT Plan Current plan remains appropriate    Co-evaluation             End of Session Equipment Utilized During Treatment: Gait belt Activity Tolerance: Patient tolerated treatment well Patient left: in chair;with call bell/phone within reach;with family/visitor present     Time: 1335-1400 PT Time Calculation (min) (ACUTE ONLY): 25 min  Charges:  $Gait Training: 8-22 mins $Therapeutic Exercise: 8-22 mins                     G Codes:      Rica Koyanagi  PTA [facility]  Acute  Rehab Pager      [phone]

## 2015-03-08 NOTE — Progress Notes (Signed)
Physical Therapy Treatment Patient Details Name: Mary [name] MRN: [id number] DOB: 1951-03-06 Today's Date: 03/08/2015    History of Present Illness 64 yo female s/p L TKA 03/07/15.     PT Comments    POD # 1 am session.  Assisted OOB to amb a greater distance in hallway.  First used a rollator per pt request.  Very unsteady with lateral sway.  Switched to standard RW.  Much safer.  Pt agrees to use RW initially then switch to her rollator after a week or so.  Returned to room and performed all supine TKR TE's following HEP handout.  Instructed on proper tech and freq as well as use of ICE.    Follow Up Recommendations  Home health PT     Equipment Recommendations  Rolling walker with 5" wheels    Recommendations for Other Services       Precautions / Restrictions Precautions Precautions: Knee Restrictions Weight Bearing Restrictions: No LLE Weight Bearing: Weight bearing as tolerated    Mobility  Bed Mobility Overal bed mobility: Needs Assistance Bed Mobility: Supine to Sit     Supine to sit: Supervision     General bed mobility comments: in creased time  Transfers Overall transfer level: Needs assistance Equipment used: Rolling walker (2 wheeled) Transfers: Sit to/from Stand Sit to Stand: Supervision         General transfer comment: one VC for hand placement  Ambulation/Gait Ambulation/Gait assistance: Min guard;Supervision Ambulation Distance (Feet): 65 Feet Assistive device: Rolling walker (2 wheeled)       General Gait Details: increased time and one VC safety with turns   Financial trader Rankin (Stroke Patients Only)       Balance                                    Cognition Arousal/Alertness: Awake/alert Behavior During Therapy: WFL for tasks assessed/performed Overall Cognitive Status: Within Functional Limits for tasks assessed                      Exercises    Total Knee Replacement TE's 10 reps B LE ankle pumps 10 reps towel squeezes 10 reps knee presses 10 reps heel slides  10 reps SAQ's 10 reps SLR's 10 reps ABD Followed by ICE     General Comments        Pertinent Vitals/Pain Pain Assessment: 0-10 Pain Score: 6  Pain Location: L knee Pain Descriptors / Indicators: Sore;Tender Pain Intervention(s): Monitored during session;Premedicated before session;Repositioned;Ice applied    Home Living                      Prior Function            PT Goals (current goals can now be found in the care plan section) Progress towards PT goals: Progressing toward goals    Frequency  7X/week    PT Plan Current plan remains appropriate    Co-evaluation             End of Session Equipment Utilized During Treatment: Gait belt Activity Tolerance: Patient tolerated treatment well Patient left: in chair;with call bell/phone within reach;with family/visitor present     Time: 1010-1050 PT Time Calculation (min) (ACUTE ONLY): 40 min  Charges:  $Gait Training: 8-22 mins $Therapeutic  Exercise: 8-22 mins $Therapeutic Activity: 8-22 mins                    G Codes:      Rica Koyanagi  PTA [facility]  Acute  Rehab Pager      [phone]

## 2015-03-08 NOTE — Care Management Note (Signed)
Case Management Note  Patient Details  Name: Mary [name] MRN: [id number] Date of Birth: 22-Sep-1950  Subjective/Objective:                   TOTAL KNEE ARTHROPLASTY (Left) Action/Plan:  Discharge planning Expected Discharge Date:                  Expected Discharge Plan:  Home/Self Care  In-House Referral:     Discharge planning Services  CM Consult  Post Acute Care Choice:    Choice offered to:     DME Arranged:  Walker rolling DME Agency:  [place]:  NA [place] Agency:     Status of Service:  Completed, signed off  Medicare Important Message Given:    Date Medicare IM Given:    Medicare IM give by:    Date Additional Medicare IM Given:    Additional Medicare Important Message give by:     If discussed at [place] of Stay Meetings, dates discussed:    Additional Comments: CM met with pt in room to offer choice of home health agency.  Pt states she will NOT have any HH services bc with her previous knee surgery, she attributes the resulting knee manipulation to not an aggressive enough home therapy.  Pt states she has an appointment this Friday for outpt PT with [facility].  CM called [facility] DME rep, Lecretia to please deliver the rolling walker to room prior to discharge; pt states she does not need the 3n1. Not other CM needs were communicated. Dellie Catholic, RN 03/08/2015, 2:34 PM

## 2015-03-08 NOTE — Progress Notes (Signed)
     Subjective: 1 Day Post-Op Procedure(s) (LRB): TOTAL KNEE ARTHROPLASTY (Left)   Patient reports pain as mild, pain controlled. No events throughout the night.  Objective:   VITALS:   Filed Vitals:   03/08/15 0608  BP: 115/62  Pulse: 74  Temp: 97.4 F (36.3 C)  Resp: 16    Dorsiflexion/Plantar flexion intact Incision: dressing C/D/I No cellulitis present Compartment soft  LABS  Recent Labs  03/08/15 0607  HGB 10.3*  HCT 31.9*  WBC 12.7*  PLT 204     Recent Labs  03/08/15 0607  NA 139  K 4.8  BUN 19  CREATININE 0.89  GLUCOSE 166*     Assessment/Plan: 1 Day Post-Op Procedure(s) (LRB): TOTAL KNEE ARTHROPLASTY (Left) Foley cath d/c'ed Advance diet Up with therapy D/C IV fluids Discharge home with home health eventually, when ready  Overweight (BMI 25-29.9) Estimated body mass index is 25.96 kg/(m^2) as calculated from the following:   Height as of this encounter: 5\' 5"  (1.651 m).   Weight as of this encounter: 70.761 kg (156 lb). Patient also counseled that weight may inhibit the healing process Patient counseled that losing weight will help with future health issues      [place] Pugh. Dove [place]   PAC  03/08/2015, 9:25 AM

## 2015-03-09 DIAGNOSIS — E663 Overweight: Secondary | ICD-10-CM | POA: Diagnosis present

## 2015-03-09 LAB — BASIC METABOLIC PANEL
Anion gap: 5 (ref 5–15)
BUN: 21 mg/dL — ABNORMAL HIGH (ref 6–20)
CALCIUM: 8.2 mg/dL — AB (ref 8.9–10.3)
CO2: 26 mmol/L (ref 22–32)
CREATININE: 0.86 mg/dL (ref 0.44–1.00)
Chloride: 107 mmol/L (ref 101–111)
GFR calc non Af Amer: >60 mL/min (ref >60)
Glucose, Bld: 143 mg/dL — ABNORMAL HIGH (ref 65–99)
Potassium: 4.3 mmol/L (ref 3.5–5.1)
SODIUM: 138 mmol/L (ref 135–145)

## 2015-03-09 LAB — CBC
HEMATOCRIT: 28 % — AB (ref 36.0–46.0)
HEMOGLOBIN: 9 g/dL — AB (ref 12.0–15.0)
MCH: 27.6 pg (ref 26.0–34.0)
MCHC: 32.1 g/dL (ref 30.0–36.0)
MCV: 85.9 fL (ref 78.0–100.0)
Platelets: 188 10*3/uL (ref 150–400)
RBC: 3.26 MIL/uL — AB (ref 3.87–5.11)
RDW: 16.3 % — ABNORMAL HIGH (ref 11.5–15.5)
WBC: 10.2 10*3/uL (ref 4.0–10.5)

## 2015-03-09 MED ORDER — ASPIRIN 325 MG PO TBEC: 325.0000 mg | DELAYED_RELEASE_TABLET | Freq: Two times a day (BID) | ORAL | Status: AC

## 2015-03-09 MED ORDER — CYCLOBENZAPRINE HCL 5 MG PO TABS: 5.0000 mg | ORAL_TABLET | Freq: Three times a day (TID) | ORAL | Status: DC | PRN

## 2015-03-09 MED ORDER — OXYCODONE HCL 5 MG PO TABS: 5.0000 mg | ORAL_TABLET | ORAL | Status: DC

## 2015-03-09 MED ORDER — DOCUSATE SODIUM 100 MG PO CAPS: 100.0000 mg | ORAL_CAPSULE | Freq: Two times a day (BID) | ORAL | Status: DC

## 2015-03-09 MED ORDER — FERROUS SULFATE 325 (65 FE) MG PO TABS: 325.0000 mg | ORAL_TABLET | Freq: Three times a day (TID) | ORAL | Status: DC

## 2015-03-09 MED ORDER — POLYETHYLENE GLYCOL 3350 17 G PO PACK: 17.0000 g | PACK | Freq: Two times a day (BID) | ORAL | Status: DC

## 2015-03-09 NOTE — Progress Notes (Signed)
Occupational Therapy Treatment Patient Details Name: Mary [name] MRN: [id number] DOB: 1950-10-12 Today's Date: 03/09/2015    History of present illness 64 yo female s/p L TKA 03/07/15.    OT comments  Pt is doing well and for d/c later today. All education completed with pt and spouse and feel she is ok to d/c from OT standpoint.    Follow Up Recommendations  No OT follow up;Supervision/Assistance - 24 hour    Equipment Recommendations  None recommended by OT    Recommendations for Other Services      Precautions / Restrictions Precautions Precautions: Knee Restrictions Weight Bearing Restrictions: No LLE Weight Bearing: Weight bearing as tolerated       Mobility Bed Mobility               General bed mobility comments: in chair.   Transfers Overall transfer level: Needs assistance Equipment used: Rolling walker (2 wheeled) Transfers: Sit to/from Stand Sit to Stand: Supervision         General transfer comment: did well with remembering hand placement.    Balance                                   ADL                           Toilet Transfer: Min guard;Ambulation;RW Toilet Transfer Details (indicate cue type and reason): to recliner.     Tub/ Shower Transfer: Walk-in shower;Min guard;Minimal Social worker ADL Comments: Pt wanting to get dressed so reviewed sequence for LB dressing and pt did don underwear and pants over bilateral feet without assist. Educated pt on making sure she has her walker in front of her when she stands up to pull up clothing. Practiced shower transfer X 2 and discussed how to bring the walker into the shower with her to help her step back to her built in seat. Husband present at end of session and OT demonstrated technique to pt/spouse and she declines need to practice again with husband present. Cautioned pt to make sure steps far enough back into shower that she has room for  walker wheels to sit just inside shower when she lifts walker over into shower. Issued shower transfer handout also. Pt transfered to and from recliner wtih walker with min guard assist.       Vision                     Perception     Praxis      Cognition   Behavior During Therapy: [facility] for tasks assessed/performed Overall Cognitive Status: Within Functional Limits for tasks assessed                       Extremity/Trunk Assessment               Exercises     Shoulder Instructions       General Comments      Pertinent Vitals/ Pain       Pain Assessment: 0-10 Pain Score: 7  Pain Location: L knee Pain Descriptors / Indicators: Sore Pain Intervention(s): Repositioned;Ice applied  Home Living Family/patient expects to be discharged to:: Private residence Living Arrangements: Spouse/significant other  Prior Functioning/Environment              Frequency Min 2X/week     Progress Toward Goals  OT Goals(current goals can now be found in the care plan section)  Progress towards OT goals: Progressing toward goals     Plan Discharge plan remains appropriate    Co-evaluation                 End of Session Equipment Utilized During Treatment: Rolling walker   Activity Tolerance Patient tolerated treatment well   Patient Left in chair;with call bell/phone within reach;with family/visitor present   Nurse Communication          Time: 2800-3491 OT Time Calculation (min): 30 min  Charges: OT General Charges $OT Visit: 1 Procedure OT Treatments $Self Care/Home Management : 8-22 mins $Therapeutic Activity: 8-22 mins  Jules Schick  [phone] 03/09/2015, 11:55 AM

## 2015-03-09 NOTE — Plan of Care (Signed)
Problem: Consults Goal: Diagnosis- Total Joint Replacement Outcome: Completed/Met Date Met:  03/09/15 Primary Total Knee LEFT  Problem: Phase III Progression Outcomes Goal: Anticoagulant follow-up in place Outcome: Not Applicable Date Met:  37/48/27 ASA for VTE, no f/u needed.

## 2015-03-09 NOTE — Discharge Summary (Signed)
Physician Discharge Summary  Patient ID: Mary [name] MRN: [id number] DOB/AGE: 66-Feb-1952 66 y.o.  Admit date: 03/07/2015 Discharge date:  03/09/2015  Procedures:  Procedure(s) (LRB): TOTAL KNEE ARTHROPLASTY (Left)  Attending Physician:  Dr. [name]   Admission Diagnoses:   Left knee primary OA / pain  Discharge Diagnoses:  Principal Problem:   S/P left TKA Active Problems:   S/P right TKA   Overweight (BMI 25.0-29.9)  Past Medical History  Diagnosis Date  . Hyperlipidemia   . GERD (gastroesophageal reflux disease)   . Complication of anesthesia     patient states does not take much medicine to put her to sleep   . PONV (postoperative nausea and vomiting)   . Mitral valve prolapse     no symptoms   . Edema     ankles to knees   . Chronic kidney disease     idiopathic micro hematuria   . Headache(784.0)     hx of migraines   . Arthritis   . Anemia   . Scalp alopecia     HPI:    Mary [name], 64 y.o. female, has a history of pain and functional disability in the left knee due to arthritis and has failed non-surgical conservative treatments for greater than 12 weeks to include NSAID's and/or analgesics, corticosteriod injections, viscosupplementation injections and activity modification. Onset of symptoms was gradual, starting years ago with gradually worsening course since that time. The patient noted prior procedures on the knee to include arthroplasty on the right knee(s). Patient currently rates pain in the left knee(s) at 7 out of 10 with activity. Patient has worsening of pain with activity and weight bearing, pain that interferes with activities of daily living, pain with passive range of motion, crepitus and joint swelling. Patient has evidence of periarticular osteophytes and joint space narrowing by imaging studies. There is no active infection. Risks, benefits and expectations were discussed with the patient. Risks including but not limited to  the risk of anesthesia, blood clots, nerve damage, blood vessel damage, failure of the prosthesis, infection and up to and including death. Patient understand the risks, benefits and expectations and wishes to proceed with surgery.   PCP: Glo Herring., MD   Discharged Condition: good  Hospital Course:  Patient underwent the above stated procedure on 03/07/2015. Patient tolerated the procedure well and brought to the recovery room in good condition and subsequently to the floor.  POD #1 BP: 115/62 ; Pulse: 74 ; Temp: 97.4 F (36.3 C) ; Resp: 16 Patient reports pain as mild, pain controlled. No events throughout the night. Dorsiflexion/plantar flexion intact, incision: dressing C/D/I, no cellulitis present and compartment soft.   LABS  Basename    HGB  10.3  HCT  31.9   POD #2  BP: 136/52 ; Pulse: 83 ; Temp: 97.7 F (36.5 C) ; Resp: 16 Patient reports pain as mild, pain controlled. No events throughout the night. Ready to be discharged home. Dorsiflexion/plantar flexion intact, incision: dressing C/D/I, no cellulitis present and compartment soft.   LABS  Basename    HGB  9.0  HCT  28.0    Discharge Exam: General appearance: alert, cooperative and no distress Extremities: Homans sign is negative, no sign of DVT, no edema, redness or tenderness in the calves or thighs and no ulcers, gangrene or trophic changes  Disposition: Home with follow up in 2 weeks   Follow-up Information    Follow up with [name], MD. Schedule an appointment  as soon as possible for a visit in 2 weeks.   Specialty:  Orthopedic Surgery   Contact information:   [address] [place] [address] [phone]       Follow up with [facility].   Why:  rolling walker   Contact information:   [address] [place] [address] [phone]       Discharge Instructions    Call MD / Call 911    Complete by:  As directed   If you experience  chest pain or shortness of breath, CALL 911 and be transported to the hospital emergency room.  If you develope a fever above 101 F, pus (white drainage) or increased drainage or redness at the wound, or calf pain, call your surgeon's office.     Change dressing    Complete by:  As directed   Maintain surgical dressing until follow up in the clinic. If the edges start to pull up, may reinforce with tape. If the dressing is no longer working, may remove and cover with gauze and tape, but must keep the area dry and clean.  Call with any questions or concerns.     Constipation Prevention    Complete by:  As directed   Drink plenty of fluids.  Prune juice may be helpful.  You may use a stool softener, such as Colace (over the counter) 100 mg twice a day.  Use MiraLax (over the counter) for constipation as needed.     Diet - low sodium heart healthy    Complete by:  As directed      Discharge instructions    Complete by:  As directed   Maintain surgical dressing until follow up in the clinic. If the edges start to pull up, may reinforce with tape. If the dressing is no longer working, may remove and cover with gauze and tape, but must keep the area dry and clean.  Follow up in 2 weeks at [facility]. Call with any questions or concerns.     Increase activity slowly as tolerated    Complete by:  As directed   Weight bearing as tolerated with assist device (walker, cane, etc) as directed, use it as long as suggested by your surgeon or therapist, typically at least 4-6 weeks.     TED hose    Complete by:  As directed   Use stockings (TED hose) for 2 weeks on both leg(s).  You may remove them at night for sleeping.             Medication List    TAKE these medications        acetaminophen 500 MG tablet  Commonly known as:  TYLENOL  Take 500-1,000 mg by mouth every 6 (six) hours as needed for moderate pain.     amLODipine 10 MG tablet  Commonly known as:  NORVASC  Take 1 tablet  (10 mg total) by mouth daily.     aspirin 325 MG EC tablet  Take 1 tablet (325 mg total) by mouth 2 (two) times daily.     benazepril 10 MG tablet  Commonly known as:  LOTENSIN  TAKE ONE TABLET BY MOUTH ONCE DAILY.     calcium carbonate 500 MG chewable tablet  Commonly known as:  TUMS - dosed in mg elemental calcium  Chew 1 tablet by mouth 4 (four) times daily.     co-enzyme Q-10 30 MG capsule  Take 200 mg  by mouth daily.     cyclobenzaprine 5 MG tablet  Commonly known as:  FLEXERIL  Take 1 tablet (5 mg total) by mouth 3 (three) times daily as needed for muscle spasms.     docusate sodium 100 MG capsule  Commonly known as:  COLACE  Take 1 capsule (100 mg total) by mouth 2 (two) times daily.     ferrous sulfate 325 (65 FE) MG tablet  Take 1 tablet (325 mg total) by mouth 3 (three) times daily after meals.     fexofenadine 180 MG tablet  Commonly known as:  ALLEGRA  Take 180 mg by mouth daily.     Fish Oil 1200 MG Caps  Take 1 capsule by mouth 2 (two) times daily.     fluticasone 50 MCG/ACT nasal spray  Commonly known as:  FLONASE  Place 2 sprays into both nostrils daily as needed for allergies.     Glucosamine-Chondroitin 750-600 MG Tabs  Take 1 tablet by mouth 2 (two) times daily.     multivitamin with minerals Tabs tablet  Take 1 tablet by mouth daily.     NON FORMULARY  Apply 1 application topically at bedtime as needed (back pain). 5% ketamine, 2% baclofen, 5% gabapentin, 2.5% lidocaine, 2.5% prilocaine cream compounded by [employer]     oxyCODONE 5 MG immediate release tablet  Commonly known as:  Oxy IR/ROXICODONE  Take 1-3 tablets (5-15 mg total) by mouth every 4 (four) hours.     polyethylene glycol packet  Commonly known as:  MIRALAX / GLYCOLAX  Take 17 g by mouth 2 (two) times daily.     pyridOXINE 100 MG tablet  Commonly known as:  VITAMIN B-6  Take 100 mg by mouth daily.     rosuvastatin 40 MG tablet  Commonly known as:  CRESTOR  Take 1  tablet (40 mg total) by mouth daily.     SUMAtriptan 25 MG tablet  Commonly known as:  IMITREX  Take 25 mg by mouth as needed for migraine or headache. May repeat in 2 hours if headache persists or recurs.         Signed: West Pugh. Brnadon Eoff   PA-C  03/09/2015, 8:35 AM

## 2015-03-09 NOTE — Progress Notes (Addendum)
     Subjective: 2 Days Post-Op Procedure(s) (LRB): TOTAL KNEE ARTHROPLASTY (Left)   Patient reports pain as mild, pain controlled. No events throughout the night. Ready to be discharged home.  Objective:   VITALS:   Filed Vitals:   03/09/15 0435  BP: 136/52  Pulse: 83  Temp: 97.7 F (36.5 C)  Resp: 16    Dorsiflexion/Plantar flexion intact Incision: dressing C/D/I No cellulitis present Compartment soft  LABS  Recent Labs  03/08/15 0607 03/09/15 0523  HGB 10.3* 9.0*  HCT 31.9* 28.0*  WBC 12.7* 10.2  PLT 204 188     Recent Labs  03/08/15 0607 03/09/15 0523  NA 139 138  K 4.8 4.3  BUN 19 21*  CREATININE 0.89 0.86  GLUCOSE 166* 143*     Assessment/Plan: 2 Days Post-Op Procedure(s) (LRB): TOTAL KNEE ARTHROPLASTY (Left) Up with therapy Discharge home with home health  Follow up in 2 weeks at [facility]. Follow up with OLIN,Saraia Platner D in 2 weeks.  Contact information:  [facility] [address], [place] [phone]    Overweight (BMI 25-29.9) Estimated body mass index is 25.96 kg/(m^2) as calculated from the following:   Height as of this encounter: 5\' 5"  (1.651 m).   Weight as of this encounter: 70.761 kg (156 lb). Patient also counseled that weight may inhibit the healing process Patient counseled that losing weight will help with future health issues         West Pugh. Maebell Lyvers   PAC  03/09/2015, 8:03 AM

## 2015-03-09 NOTE — Progress Notes (Signed)
Physical Therapy Treatment Patient Details Name: Mary [name] MRN: [id number] DOB: 10/13/1950 Today's Date: 03/09/2015    History of Present Illness 64 yo female s/p L TKA 03/07/15.     PT Comments    Educated and had pt. Husband demonstrate assisting wife up and down stairs, no rails, with RW; pt. Husband able.   Follow Up Recommendations  Home health PT     Equipment Recommendations  Rolling walker with 5" wheels    Recommendations for Other Services       Precautions / Restrictions Precautions Precautions: Knee Restrictions Weight Bearing Restrictions: No LLE Weight Bearing: Weight bearing as tolerated    Mobility  Bed Mobility Transfers Overall transfer level: Modified independent Equipment used: Rolling walker (2 wheeled) Transfers: Sit to/from Stand Sit to Stand: Modified independent (Device/Increase time)         General transfer comment: did well with remembering hand placement.  Ambulation/Gait Ambulation/Gait assistance: Supervision;Modified independent (Device/Increase time) Ambulation Distance (Feet): 10 Feet Assistive device: Rolling walker (2 wheeled) Gait Pattern/deviations: Step-to pattern         Stairs Stairs: Yes Stairs assistance: Min guard Stair Management: No rails Number of Stairs: 4 General stair comments: educated pt. husband on assisting pt up and down stairs with walker for safety  Wheelchair Mobility    Modified Rankin (Stroke Patients Only)       Balance                                    Cognition Arousal/Alertness: Awake/alert Behavior During Therapy: WFL for tasks assessed/performed Overall Cognitive Status: Within Functional Limits for tasks assessed                      Exercises   General Comments        Pertinent Vitals/Pain Pain Assessment: 0-10 Pain Score: 2  Pain Location: L knee Pain Descriptors / Indicators: Sore Pain Intervention(s): Ice applied    Home Living  Family/patient expects to be discharged to:: Private residence Living Arrangements: Spouse/significant other                  Prior Function            PT Goals (current goals can now be found in the care plan section) Acute Rehab PT Goals Patient Stated Goal: home today  PT Goal Formulation: With patient Time For Goal Achievement: 03/14/15 Potential to Achieve Goals: Good Progress towards PT goals: Progressing toward goals    Frequency  7X/week    PT Plan Current plan remains appropriate    Co-evaluation             End of Session Equipment Utilized During Treatment: Gait belt Activity Tolerance: Patient tolerated treatment well Patient left: in chair;with call bell/phone within reach;with family/visitor present     Time: 1200-1210 PT Time Calculation (min) (ACUTE ONLY): 10 min  Charges:  $Gait Training: 8-22 mins                     G CodesDenna Haggard 03/09/2015, 12:59 PM   Reviewed and agree with above  Rica Koyanagi  PTA [facility]  Acute  Rehab Pager      [phone]

## 2015-03-09 NOTE — Progress Notes (Signed)
Physical Therapy Treatment Patient Details Name: Mary [name] MRN: [id number] DOB: 21-Feb-1951 Today's Date: 03/09/2015    History of Present Illness 64 yo female s/p L TKA 03/07/15.     PT Comments    Pt. Ambulated with RW 200+ ft and practiced up and down stairs w/o husband, 50% VC required in beginning for proper lead leg, however, pt was able to correctly remember by end of treatment; performed supine exercises 10 reps each; pt tolerated well and able to do SLR independently    Follow Up Recommendations  Home health PT     Equipment Recommendations  Rolling walker with 5" wheels    Recommendations for Other Services       Precautions / Restrictions Precautions Precautions: Knee Restrictions Weight Bearing Restrictions: No LLE Weight Bearing: Weight bearing as tolerated    Mobility  Bed Mobility Overal bed mobility: Modified Independent Bed Mobility: Supine to Sit     Supine to sit: Modified independent (Device/Increase time)     General bed mobility comments: in chair.   Transfers Overall transfer level: Modified independent Equipment used: Rolling walker (2 wheeled) Transfers: Sit to/from Stand Sit to Stand: Modified independent (Device/Increase time)         General transfer comment: did well with remembering hand placement.  Ambulation/Gait Ambulation/Gait assistance: Supervision Ambulation Distance (Feet): 200 Feet Assistive device: Rolling walker (2 wheeled) Gait Pattern/deviations: Step-to pattern         Stairs Stairs: Yes Stairs assistance: Min guard Stair Management: No rails Number of Stairs: 4 General stair comments: educated pt. and pt. husband on going up and down stairs with walker and no rails; required 50% VC for correct positioning of walker and self in beginning of treatment but by end was Independent with technique - pt. still needs supervision/min guard for safety   Wheelchair Mobility    Modified Rankin (Stroke  Patients Only)       Balance                                    Cognition Arousal/Alertness: Awake/alert Behavior During Therapy: WFL for tasks assessed/performed Overall Cognitive Status: Within Functional Limits for tasks assessed                      Exercises Total Joint Exercises Ankle Circles/Pumps: AROM;10 reps;Both;Seated Quad Sets: AROM;Left;10 reps;Seated Towel Squeeze: AROM;Left;10 reps;Seated Short Arc Quad: AROM;Left;10 reps;Supine Heel Slides: AROM;Left;Supine Hip ABduction/ADduction: AROM;Left;10 reps;Supine Straight Leg Raises: AROM;Left;10 reps;Supine    General Comments        Pertinent Vitals/Pain Pain Assessment: 0-10 Pain Score: 3  Pain Location: L knee Pain Descriptors / Indicators: Sore Pain Intervention(s): Ice applied;Repositioned    Home Living Family/patient expects to be discharged to:: Private residence Living Arrangements: Spouse/significant other                  Prior Function            PT Goals (current goals can now be found in the care plan section) Acute Rehab PT Goals Patient Stated Goal: home PT Goal Formulation: With patient Time For Goal Achievement: 03/14/15 Potential to Achieve Goals: Good Progress towards PT goals: Progressing toward goals    Frequency  7X/week    PT Plan Current plan remains appropriate    Co-evaluation             End of Session Equipment Utilized  During Treatment: Gait belt Activity Tolerance: Patient tolerated treatment well Patient left: in chair;with call bell/phone within reach;with family/visitor present     Time:  - 10:15 - 10:50    Charges:    1 gt   1 te   1 ta                   G CodesDenna Haggard 03/09/2015, 12:46 PM   Reviewed and agree with above Rica Koyanagi  PTA [facility]  Acute  Rehab Pager      [phone]

## 2015-04-18 ENCOUNTER — Other Ambulatory Visit: Payer: Self-pay | Admitting: Cardiovascular Disease

## 2015-04-18 NOTE — Telephone Encounter (Signed)
Rx request sent to pharmacy.  

## 2015-05-12 ENCOUNTER — Other Ambulatory Visit: Payer: Self-pay | Admitting: Cardiovascular Disease

## 2015-05-12 NOTE — Telephone Encounter (Signed)
Rx request sent to pharmacy.  

## 2015-07-17 ENCOUNTER — Other Ambulatory Visit: Payer: Self-pay | Admitting: Cardiovascular Disease

## 2015-11-08 ENCOUNTER — Other Ambulatory Visit: Payer: Self-pay | Admitting: Cardiovascular Disease

## 2015-11-08 NOTE — Telephone Encounter (Signed)
Rx request sent to pharmacy.  

## 2016-01-08 ENCOUNTER — Ambulatory Visit (INDEPENDENT_AMBULATORY_CARE_PROVIDER_SITE_OTHER): Payer: Medicare Other | Admitting: Otolaryngology

## 2016-01-08 DIAGNOSIS — J310 Chronic rhinitis: Secondary | ICD-10-CM

## 2016-01-08 DIAGNOSIS — K219 Gastro-esophageal reflux disease without esophagitis: Secondary | ICD-10-CM | POA: Diagnosis not present

## 2016-01-08 DIAGNOSIS — J343 Hypertrophy of nasal turbinates: Secondary | ICD-10-CM | POA: Diagnosis not present

## 2016-01-08 DIAGNOSIS — R05 Cough: Secondary | ICD-10-CM

## 2016-01-15 ENCOUNTER — Other Ambulatory Visit: Payer: Self-pay | Admitting: Cardiovascular Disease

## 2016-02-08 ENCOUNTER — Other Ambulatory Visit: Payer: Self-pay | Admitting: Cardiovascular Disease

## 2016-02-08 NOTE — Telephone Encounter (Signed)
Rx request sent to pharmacy.  

## 2016-02-12 ENCOUNTER — Other Ambulatory Visit (INDEPENDENT_AMBULATORY_CARE_PROVIDER_SITE_OTHER): Payer: Self-pay | Admitting: Otolaryngology

## 2016-02-12 ENCOUNTER — Ambulatory Visit (INDEPENDENT_AMBULATORY_CARE_PROVIDER_SITE_OTHER): Payer: Medicare Other | Admitting: Otolaryngology

## 2016-02-12 ENCOUNTER — Ambulatory Visit (HOSPITAL_COMMUNITY)
Admission: RE | Admit: 2016-02-12 | Discharge: 2016-02-12 | Disposition: A | Discharge status: Home / Self Care | Payer: Medicare Other | Source: Ambulatory Visit | Attending: Otolaryngology | Admitting: Otolaryngology

## 2016-02-12 DIAGNOSIS — R05 Cough: Secondary | ICD-10-CM | POA: Insufficient documentation

## 2016-02-12 DIAGNOSIS — R059 Cough, unspecified: Secondary | ICD-10-CM

## 2016-02-12 DIAGNOSIS — K219 Gastro-esophageal reflux disease without esophagitis: Secondary | ICD-10-CM | POA: Diagnosis not present

## 2016-02-13 ENCOUNTER — Other Ambulatory Visit (HOSPITAL_COMMUNITY): Payer: Self-pay | Admitting: Internal Medicine

## 2016-02-13 DIAGNOSIS — Z1231 Encounter for screening mammogram for malignant neoplasm of breast: Secondary | ICD-10-CM

## 2016-02-13 DIAGNOSIS — M1991 Primary osteoarthritis, unspecified site: Secondary | ICD-10-CM

## 2016-02-22 ENCOUNTER — Other Ambulatory Visit (HOSPITAL_COMMUNITY): Payer: Self-pay | Admitting: Internal Medicine

## 2016-02-22 DIAGNOSIS — M1991 Primary osteoarthritis, unspecified site: Secondary | ICD-10-CM

## 2016-02-22 DIAGNOSIS — Z780 Asymptomatic menopausal state: Secondary | ICD-10-CM

## 2016-02-26 ENCOUNTER — Encounter: Payer: Self-pay | Admitting: Cardiovascular Disease

## 2016-02-26 ENCOUNTER — Ambulatory Visit (INDEPENDENT_AMBULATORY_CARE_PROVIDER_SITE_OTHER): Payer: Medicare Other | Admitting: Cardiovascular Disease

## 2016-02-26 VITALS — BP 116/70 | HR 64 | Ht 65.0 in | Wt 157.4 lb

## 2016-02-26 DIAGNOSIS — K219 Gastro-esophageal reflux disease without esophagitis: Secondary | ICD-10-CM

## 2016-02-26 DIAGNOSIS — E7800 Pure hypercholesterolemia, unspecified: Secondary | ICD-10-CM | POA: Diagnosis not present

## 2016-02-26 DIAGNOSIS — I10 Essential (primary) hypertension: Secondary | ICD-10-CM | POA: Diagnosis not present

## 2016-02-26 DIAGNOSIS — Z8669 Personal history of other diseases of the nervous system and sense organs: Secondary | ICD-10-CM

## 2016-02-26 NOTE — Patient Instructions (Signed)
Your physician wants you to follow-up in: 1 year or sooner if needed. You will receive a reminder letter in the mail two months in advance. If you don't receive a letter, please call our office to schedule the follow-up appointment.   If you need a refill on your cardiac medications before your next appointment, please call your pharmacy.   

## 2016-02-26 NOTE — Progress Notes (Signed)
Patient ID: Mary [name], female   DOB: 07/29/1950, 65 y.o.   MRN: [id number]     HPI: Mary [name] is a 65 y.o. female presents to the office for a 15 month  cardiology evaluation.  Ms. Appleby has a long-standing history of migraine headaches,  hypertension, hyperlipidemia, GERD, and has strong family history for both coronary as well as peripheral vascular disease. She also has PVD with lower extremity venous abnormalities. Remotely, she had been on pravastatin for hyperlipidemia but this was switched to Crestor do to inadequate response to pravastatin.   When I saw her over one year ago, she was stable on amlodipine 10 mg, benazepril 10 mg for hypetension as well as her migraine headaches and Crestor 40 mg for hyperlipidemia.   She had developed a rash and stopped taking Toprol-XL 12.5 mg.  She denied any palpitations.  Following its discontinuance or awareness of blood pressure elevation.  Over the past year, she has been bothered by chronic bronchitis and allergic cough.  She also underwent left knee replacement in October 2016 after previously undergoing right knee replacement in April 2015.  She has seen Dr. Fusco in July and apparently complete set of blood work was done and she was told that these were within normal limits.  She denies any chest pain.  She is unaware of recurrent palpitations.  She denies presyncope or syncope.  She completed 3 courses of antibiotics.  She will be having a follow-up ENT evaluation later this week.  A chest x-ray on 02/12/2016 did not reveal any active cardiopulmonary disease.  She presents for evaluation.  Past Medical History:  Diagnosis Date  . Anemia   . Arthritis   . Chronic kidney disease    idiopathic micro hematuria   . Complication of anesthesia    patient states does not take much medicine to put her to sleep   . Edema    ankles to knees   . GERD (gastroesophageal reflux disease)   . Headache(784.0)    hx of migraines   .  Hyperlipidemia   . Mitral valve prolapse    no symptoms   . PONV (postoperative nausea and vomiting)   . Scalp alopecia     Past Surgical History:  Procedure Laterality Date  . CERVICAL FUSION  2006   cervical 5-6   . MYOCARDIAL PERFUSION STUDY  07/02/2011   NO SCINTIGRAPHIC EVIDENCE OF INDUCIBLE MYOCARDIAL ISCHEMIA. POST-STRESS EF IS 87%.EXERCISE CAPACITY 9 METS. EKG SHOWS NSR AT 69. THERE IS 1MM HORIZONTAL ST DEPRESSION WITH EXERCISE IN II, III, AVF SEEN BEST AT EARLY RECOVERY. THIS IS PRESENT IN V3-V5 AS WELL AND RETURNS TO BASELINE AT THE END OF THE TEST. NORMAL STUDY.  . RENAL ARTERY DOPPLER  12/28/2003   CELIAC ARTERY: AT REST, 168.4 CM/S; INSPRIATION, 177.6 CM/S. ARCUATE LIGAMENT COMPRESSION SYNDROME. R & L KIDNEYS: RIGHT KIDNEY MEASURES SOME WHAT SMALLER THAN LEFT. KIDNEY ARE ESSENTIALLY SYMMETRICAL IN SHAPE WITH NO OBVIOUS ABNORMALITY VISUALIZED. R & L RENAL ARTERIES: DEMONSTRATE NORMAL SPECTRA. NO SUGGESTION OF DIAMETER REDUCTION, DISSECTION, FIBROMUSCULAR DYSPLASIA OR ANY OTHER VASCULAR ABNOR  . TOTAL KNEE ARTHROPLASTY Right 08/31/2013   Procedure: RIGHT TOTAL KNEE ARTHROPLASTY;  Surgeon: Matthew D Olin, MD;  Location: [facility];  Service: Orthopedics;  Laterality: Right;  . TOTAL KNEE ARTHROPLASTY Left 03/07/2015   Procedure: TOTAL KNEE ARTHROPLASTY;  Surgeon: Matthew Olin, MD;  Location: [facility];  Service: Orthopedics;  Laterality: Left;  . TRANSTHORACIC ECHOCARDIOGRAM  07/02/2011   LV SYSTOLIC FUNCTION   NORMAL, EF=>55%, INSIGNIFICANT PERICARDIAL EFFUSION, LEFT ATRIAL SIZE IS NORMAL, RV SYSTOLIC PRESSURE IS NORMAL. NO SIGN VALVULAR HEART DISEASE.  . TUBAL LIGATION  1986  . veins stripping      bilateral legs     Allergies  Allergen Reactions  . Metoprolol     rash  . Sporanox [Itraconazole] Swelling    Bilateral knee and ankle swelling and pain     Current Outpatient Prescriptions  Medication Sig Dispense Refill  . acetaminophen (TYLENOL) 500 MG tablet Take 500-1,000 mg by  mouth every 6 (six) hours as needed for moderate pain.    . amLODipine (NORVASC) 10 MG tablet Take 1 tablet (10 mg total) by mouth daily. Please keep upcoming appointment for additional refills. 90 tablet 0  . benazepril (LOTENSIN) 10 MG tablet Take 1 tablet (10 mg total) by mouth daily. Please keep upcoming appointment for additional refills. 90 tablet 0  . calcium carbonate (TUMS - DOSED IN MG ELEMENTAL CALCIUM) 500 MG chewable tablet Chew 1 tablet by mouth 4 (four) times daily.    . co-enzyme Q-10 30 MG capsule Take 200 mg by mouth daily.     . ferrous sulfate 325 (65 FE) MG tablet Take 1 tablet (325 mg total) by mouth 3 (three) times daily after meals.  3  . fexofenadine (ALLEGRA) 180 MG tablet Take 180 mg by mouth daily.    . fluticasone (FLONASE) 50 MCG/ACT nasal spray Place 2 sprays into both nostrils daily as needed for allergies.     . furosemide (LASIX) 20 MG tablet Take 1 tablet by mouth every morning.    . gabapentin (NEURONTIN) 100 MG capsule Take 100 mg in the morning and at lunch; 300 mg at night.    . Glucosamine-Chondroitin 750-600 MG TABS Take 1 tablet by mouth 2 (two) times daily.     . Multiple Vitamin (MULTIVITAMIN WITH MINERALS) TABS tablet Take 1 tablet by mouth daily.    . NON FORMULARY Apply 1 application topically at bedtime as needed (back pain). 5% ketamine, 2% baclofen, 5% gabapentin, 2.5% lidocaine, 2.5% prilocaine cream compounded by [facility]    . Omega-3 Fatty Acids (FISH OIL) 1200 MG CAPS Take 1 capsule by mouth 2 (two) times daily.    . pyridOXINE (VITAMIN B-6) 100 MG tablet Take 100 mg by mouth daily.    . ranitidine (ZANTAC) 150 MG tablet Take 1 tablet by mouth at bedtime.    . rosuvastatin (CRESTOR) 40 MG tablet TAKE 1 TABLET BY MOUTH AT BEDTIME. 90 tablet 1  . SUMAtriptan (IMITREX) 25 MG tablet Take 25 mg by mouth as needed for migraine or headache. May repeat in 2 hours if headache persists or recurs.     No current facility-administered  medications for this visit.     Social History   Social History  . Marital status: Married    Spouse name: N/A  . Number of children: N/A  . Years of education: N/A   Occupational History  . Not on file.   Social History Main Topics  . Smoking status: Former Smoker    Types: Cigarettes    Quit date: 05/13/1988  . Smokeless tobacco: Never Used  . Alcohol use Yes     Comment: wine- rarely.  . Drug use: No  . Sexual activity: Not on file   Other Topics Concern  . Not on file   Social History Narrative  . No narrative on file   Social history is notable that she is married   and has remained active. She does water aerobics and also rides a bicycle. She does not smoke, but there is a remote history of tobacco use having quit approximately 27 years ago. She has 2 children. She is the sister-in-law of Dr. Michael Norins and is married to Dr. Norin's wife's brother.  She is retired special education teacher and retired at age 60.  Family History  Problem Relation Age of Onset  . Dementia Mother   . Heart attack Father   . Heart disease Father   . Kidney failure Father   . Heart attack Brother     age 46  . Cancer - Other Brother     kidney   ROS General: Negative; No fevers, chills, or night sweats;  HEENT: Negative; No changes in vision or hearing, sinus congestion, difficulty swallowing Pulmonary: Negative; No cough, wheezing, shortness of breath, hemoptysis Cardiovascular: See history of present illness GI: Negative; No nausea, vomiting, diarrhea, or abdominal pain GU: Negative; No dysuria, hematuria, or difficulty voiding Musculoskeletal: Negative; no myalgias, joint pain, or weakness Hematologic/Oncology: Negative; no easy bruising, bleeding Endocrine: Negative; no heat/cold intolerance; no diabetes Neuro: Positive for history of migraines, currently approximately 3-4 per year Skin: Negative; No rashes or skin lesions Psychiatric: Negative; No behavioral problems,  depression Sleep: Negative; No snoring, daytime sleepiness, hypersomnolence, bruxism, restless legs, hypnogognic hallucinations, no cataplexy Other comprehensive 14 point system review is negative.   PE BP 116/70 (BP Location: Left Arm, Patient Position: Sitting, Cuff Size: Large)   Pulse 64   Ht 5' 5" (1.651 m)   Wt 157 lb 6.4 oz (71.4 kg)   BMI 26.19 kg/m    Repeat blood pressure by me was 110/64. Wt Readings from Last 3 Encounters:  02/26/16 157 lb 6.4 oz (71.4 kg)  03/07/15 156 lb (70.8 kg)  02/28/15 156 lb (70.8 kg)   General: Alert, oriented, no distress.  Skin: normal turgor, no rashes HEENT: Normocephalic, atraumatic. Pupils round and reactive; sclera anicteric;no lid lag. Extraocular muscles intact. Nose without nasal septal hypertrophy Mouth/Parynx benign; Mallinpatti scale 2 Neck: No JVD, no carotid bruits; normal carotid upstroke Lungs: clear to ausculatation and percussion; no wheezing or rales Chest wall: no tenderness to palpitation Heart: RRR, s1 s2 normal 1/6 systolic murmur; .  No diastolic murmur.  No rubs thrills or heaves. Abdomen: soft, nontender; no hepatosplenomehaly, BS+; abdominal aorta nontender and not dilated by palpation. Back: no CVA tenderness Pulses 2+ Extremities: no clubbing cyanosis or edema, Homan's sign negative  Neurologic: grossly nonfocal; cranial nerves grossly normal. Psychologic: normal affect and mood.  ECG (independently read by me): Normal sinus rhythm at 64 bpm.  July 2016 ECG (independently read by me):  Normal sinus rhythm at 72 bpm. Borderline left atrial enlargement.  January 2016 ECG (independently read by me): Normal sinus rhythm at 69 bpm.  Normal intervals.  No significant ST segment changes.  January 2015 ECG (independently read by me): Normal sinus rhythm with nonspecific ST-T changes. Heart rate 75 beats per minute.  LABS: BMP Latest Ref Rng & Units 03/09/2015 03/08/2015 02/28/2015  Glucose 65 - 99 mg/dL 143(H)  166(H) 96  BUN 6 - 20 mg/dL 21(H) 19 18  Creatinine 0.44 - 1.00 mg/dL 0.86 0.89 1.15(H)  Sodium 135 - 145 mmol/L 138 139 142  Potassium 3.5 - 5.1 mmol/L 4.3 4.8 3.9  Chloride 101 - 111 mmol/L 107 109 107  CO2 22 - 32 mmol/L 26 24 28  Calcium 8.9 - 10.3 mg/dL 8.2(L) 8.4(L) 9.4     Hepatic Function Latest Ref Rng & Units 12/29/2014 05/10/2014 05/17/2013  Total Protein 6.1 - 8.1 g/dL 6.8 6.9 6.9  Albumin 3.6 - 5.1 g/dL 3.9 4.1 4.0  AST 10 - 35 U/L 19 19 19  ALT 6 - 29 U/L 17 17 17  Alk Phosphatase 33 - 130 U/L 73 69 63  Total Bilirubin 0.2 - 1.2 mg/dL 0.4 0.4 0.4   CBC Latest Ref Rng & Units 03/09/2015 03/08/2015 02/28/2015  WBC 4.0 - 10.5 K/uL 10.2 12.7(H) 5.3  Hemoglobin 12.0 - 15.0 g/dL 9.0(L) 10.3(L) 12.4  Hematocrit 36.0 - 46.0 % 28.0(L) 31.9(L) 38.3  Platelets 150 - 400 K/uL 188 204 236   Lab Results  Component Value Date   MCV 85.9 03/09/2015   MCV 87.2 03/08/2015   MCV 86.8 02/28/2015   Lab Results  Component Value Date   TSH 2.014 12/29/2014   Lipid Panel     Component Value Date/Time   CHOL 173 12/29/2014 0852   TRIG 54 12/29/2014 0852   HDL 75 12/29/2014 0852   CHOLHDL 2.3 12/29/2014 0852   VLDL 11 12/29/2014 0852   LDLCALC 87 12/29/2014 0852     RADIOLOGY: No results found.    ASSESSMENT AND PLAN: Ms. Salmons is 65-year-old female who has a long-standing history of hypertension as well as hyperlipidemia. Presently, her blood pressure is well controlled on combination therapy with amlodipine 10 mg,and  benazepril 10 mg. She is no longer taking Toprol secondary to a rash and a resting pulse is 64.  She denies any palpitations.  She has tolerated bilateral knee replacement surgery by Dr.: With her last left knee replacement being done in October 2016.  She has not had any cardiovascular consequences.  Her ECG today remains stable.  She has had chronic bronchitis and denies fever.  I reviewed her recent chest x-ray.  She also has been taking Allegra and recently  was started on Singulair for her allergic symptomatology.  She will be having a follow-up ENT evaluation this week.She is tolerating Crestor 40 mg for hyperlipidemia.  There is no edema on her current dose of furosemide 20 mg daily.  Her GERD is controlled with Zantac.  I will ask that her blood work that had been done at [facility] be sent to me for my review.  Her migraine have a headaches are relatively stable and she has used Imitrex approximately 2 times over the past year.  Time spent: 25 minutes    A. , MD, FACC  02/26/2016 5:18 PM    

## 2016-02-28 ENCOUNTER — Ambulatory Visit (HOSPITAL_COMMUNITY)
Admission: RE | Admit: 2016-02-28 | Discharge: 2016-02-28 | Disposition: A | Discharge status: Home / Self Care | Payer: Medicare Other | Source: Ambulatory Visit | Attending: Internal Medicine | Admitting: Internal Medicine

## 2016-02-28 DIAGNOSIS — Z780 Asymptomatic menopausal state: Secondary | ICD-10-CM | POA: Diagnosis not present

## 2016-02-28 DIAGNOSIS — M1991 Primary osteoarthritis, unspecified site: Secondary | ICD-10-CM

## 2016-02-28 DIAGNOSIS — M8580 Other specified disorders of bone density and structure, unspecified site: Secondary | ICD-10-CM | POA: Insufficient documentation

## 2016-02-28 DIAGNOSIS — Z1231 Encounter for screening mammogram for malignant neoplasm of breast: Secondary | ICD-10-CM | POA: Insufficient documentation

## 2016-03-04 ENCOUNTER — Ambulatory Visit (INDEPENDENT_AMBULATORY_CARE_PROVIDER_SITE_OTHER): Payer: Medicare Other | Admitting: Otolaryngology

## 2016-03-04 DIAGNOSIS — H9313 Tinnitus, bilateral: Secondary | ICD-10-CM

## 2016-03-04 DIAGNOSIS — R05 Cough: Secondary | ICD-10-CM | POA: Diagnosis not present

## 2016-03-04 DIAGNOSIS — H903 Sensorineural hearing loss, bilateral: Secondary | ICD-10-CM

## 2016-04-14 ENCOUNTER — Other Ambulatory Visit: Payer: Self-pay | Admitting: Cardiovascular Disease

## 2016-04-15 ENCOUNTER — Ambulatory Visit (INDEPENDENT_AMBULATORY_CARE_PROVIDER_SITE_OTHER): Payer: Medicare Other | Admitting: Otolaryngology

## 2016-04-15 DIAGNOSIS — K219 Gastro-esophageal reflux disease without esophagitis: Secondary | ICD-10-CM | POA: Diagnosis not present

## 2016-04-15 DIAGNOSIS — R490 Dysphonia: Secondary | ICD-10-CM | POA: Diagnosis not present

## 2016-04-15 NOTE — Telephone Encounter (Signed)
Rx(s) sent to pharmacy electronically.  

## 2016-05-08 ENCOUNTER — Other Ambulatory Visit: Payer: Self-pay | Admitting: Cardiovascular Disease

## 2016-05-08 ENCOUNTER — Encounter (HOSPITAL_COMMUNITY): Payer: Self-pay | Admitting: Emergency Medicine

## 2016-05-08 ENCOUNTER — Emergency Department (HOSPITAL_COMMUNITY)
Admission: EM | Admit: 2016-05-08 | Discharge: 2016-05-08 | Disposition: A | Discharge status: Home / Self Care | Payer: Medicare Other | Attending: Emergency Medicine | Admitting: Emergency Medicine

## 2016-05-08 ENCOUNTER — Emergency Department (HOSPITAL_COMMUNITY): Payer: Medicare Other

## 2016-05-08 DIAGNOSIS — Y92481 Parking lot as the place of occurrence of the external cause: Secondary | ICD-10-CM | POA: Diagnosis not present

## 2016-05-08 DIAGNOSIS — Y9301 Activity, walking, marching and hiking: Secondary | ICD-10-CM | POA: Insufficient documentation

## 2016-05-08 DIAGNOSIS — W010XXA Fall on same level from slipping, tripping and stumbling without subsequent striking against object, initial encounter: Secondary | ICD-10-CM | POA: Insufficient documentation

## 2016-05-08 DIAGNOSIS — N189 Chronic kidney disease, unspecified: Secondary | ICD-10-CM | POA: Diagnosis not present

## 2016-05-08 DIAGNOSIS — S63502A Unspecified sprain of left wrist, initial encounter: Secondary | ICD-10-CM | POA: Diagnosis not present

## 2016-05-08 DIAGNOSIS — S60011A Contusion of right thumb without damage to nail, initial encounter: Secondary | ICD-10-CM | POA: Insufficient documentation

## 2016-05-08 DIAGNOSIS — Z79899 Other long term (current) drug therapy: Secondary | ICD-10-CM | POA: Diagnosis not present

## 2016-05-08 DIAGNOSIS — I129 Hypertensive chronic kidney disease with stage 1 through stage 4 chronic kidney disease, or unspecified chronic kidney disease: Secondary | ICD-10-CM | POA: Diagnosis not present

## 2016-05-08 DIAGNOSIS — Y998 Other external cause status: Secondary | ICD-10-CM | POA: Insufficient documentation

## 2016-05-08 DIAGNOSIS — Z87891 Personal history of nicotine dependence: Secondary | ICD-10-CM | POA: Insufficient documentation

## 2016-05-08 DIAGNOSIS — S6992XA Unspecified injury of left wrist, hand and finger(s), initial encounter: Secondary | ICD-10-CM | POA: Diagnosis present

## 2016-05-08 MED ORDER — TRAMADOL HCL 50 MG PO TABS: 50.0000 mg | ORAL_TABLET | Freq: Four times a day (QID) | ORAL | 0 refills | Status: DC | PRN

## 2016-05-08 NOTE — ED Triage Notes (Signed)
Patient states she tripped in a parking lot and caught herself on right hand. States right wrist and arm pain.

## 2016-05-08 NOTE — ED Provider Notes (Signed)
[facility] DEPT Provider Note   CSN: [id number] Arrival date & time: 05/08/16  1154    By signing my name below, I, Mary [name], attest that this documentation has been prepared under the direction and in the presence of Mary Manifold, MD. Electronically Signed: Macon [name], ED Scribe. 05/08/16. 1:09 PM.  History   Chief Complaint Chief Complaint  Patient presents with  . Wrist Pain   The history is provided by the patient. No language interpreter was used.   HPI Comments: Mary [name] is a 65 y.o. female who presents to the Emergency Department complaining of moderate, constant, right wrist pain s/p fall that occurred earlier today. Pt states she was walking on a gravel parking lot, she notes tripping over her feet, which resulted in her falling and landing on both of her outstretched hands. Pt denies head injury or LOC. She notes her right wrist pain is exacerbated with movement. Pt denies elbow pain. She reports taking a Tylenol at home with significant relief. Per pt, she has a hx of osteopenia. She denies swelling, fever, nausea, vomiting, HA. No additional injuries.   Past Medical History:  Diagnosis Date  . Anemia   . Arthritis   . Chronic kidney disease    idiopathic micro hematuria   . Complication of anesthesia    patient states does not take much medicine to put her to sleep   . Edema    ankles to knees   . GERD (gastroesophageal reflux disease)   . Headache(784.0)    hx of migraines   . Hyperlipidemia   . Mitral valve prolapse    no symptoms   . PONV (postoperative nausea and vomiting)   . Scalp alopecia     Patient Active Problem List   Diagnosis Date Noted  . Overweight (BMI 25.0-29.9) 03/09/2015  . S/P left TKA 03/07/2015  . Palpitations 05/23/2014  . Expected blood loss anemia 09/01/2013  . S/P right TKA 08/31/2013  . DJD (degenerative joint disease) of knee 08/31/2013  . Pre-operative clearance, cardiac 08/17/2013  . HTN  (hypertension) 05/20/2013  . Hyperlipidemia 05/20/2013  . Migraine 02/23/2007  . ALLERGIC RHINITIS 02/23/2007  . TRACHEOBRONCHITIS 02/23/2007  . COUGH 02/23/2007    Past Surgical History:  Procedure Laterality Date  . CERVICAL FUSION  2006   cervical 5-6   . MYOCARDIAL PERFUSION STUDY  07/02/2011   NO SCINTIGRAPHIC EVIDENCE OF INDUCIBLE MYOCARDIAL ISCHEMIA. POST-STRESS EF IS 87%.EXERCISE CAPACITY 9 METS. EKG SHOWS NSR AT 69. THERE IS 1MM HORIZONTAL ST DEPRESSION WITH EXERCISE IN II, III, AVF SEEN BEST AT EARLY RECOVERY. THIS IS PRESENT IN V3-V5 AS WELL AND RETURNS TO BASELINE AT THE END OF THE TEST. NORMAL STUDY.  Marland Kitchen RENAL ARTERY DOPPLER  12/28/2003   CELIAC ARTERY: AT REST, 168.4 CM/S; INSPRIATION, 177.6 CM/S. ARCUATE LIGAMENT COMPRESSION SYNDROME. R & L KIDNEYS: RIGHT KIDNEY MEASURES SOME WHAT SMALLER THAN LEFT. KIDNEY ARE ESSENTIALLY SYMMETRICAL IN SHAPE WITH NO OBVIOUS ABNORMALITY VISUALIZED. R & L RENAL ARTERIES: DEMONSTRATE NORMAL SPECTRA. NO SUGGESTION OF DIAMETER REDUCTION, DISSECTION, FIBROMUSCULAR DYSPLASIA OR ANY OTHER VASCULAR ABNOR  . TOTAL KNEE ARTHROPLASTY Right 08/31/2013   Procedure: RIGHT TOTAL KNEE ARTHROPLASTY;  Surgeon: [name], MD;  Location: [facility];  Service: Orthopedics;  Laterality: Right;  . TOTAL KNEE ARTHROPLASTY Left 03/07/2015   Procedure: TOTAL KNEE ARTHROPLASTY;  Surgeon: [name], MD;  Location: [facility];  Service: Orthopedics;  Laterality: Left;  . TRANSTHORACIC ECHOCARDIOGRAM  [id number]   LV SYSTOLIC FUNCTION NORMAL, EF=>55%,  INSIGNIFICANT PERICARDIAL EFFUSION, LEFT ATRIAL SIZE IS NORMAL, RV SYSTOLIC PRESSURE IS NORMAL. NO SIGN VALVULAR HEART DISEASE.  . TUBAL LIGATION  1986  . veins stripping      bilateral legs     OB History    No data available       Home Medications    Prior to Admission medications   Medication Sig Start Date End Date Taking? Authorizing Provider  acetaminophen (TYLENOL) 500 MG tablet Take 500-1,000 mg by mouth every  6 (six) hours as needed for moderate pain.    Historical Provider, MD  amLODipine (NORVASC) 10 MG tablet TAKE ONE TABLET BY MOUTH ONCE DAILY. 04/15/16   Troy Sine, MD  benazepril (LOTENSIN) 10 MG tablet TAKE ONE TABLET BY MOUTH ONCE DAILY. 04/15/16   Troy Sine, MD  calcium carbonate (TUMS - DOSED IN MG ELEMENTAL CALCIUM) 500 MG chewable tablet Chew 1 tablet by mouth 4 (four) times daily.    Historical Provider, MD  co-enzyme Q-10 30 MG capsule Take 200 mg by mouth daily.     Historical Provider, MD  ferrous sulfate 325 (65 FE) MG tablet Take 1 tablet (325 mg total) by mouth 3 (three) times daily after meals. 03/09/15   Danae Orleans, PA-C  fexofenadine (ALLEGRA) 180 MG tablet Take 180 mg by mouth daily.    Historical Provider, MD  fluticasone (FLONASE) 50 MCG/ACT nasal spray Place 2 sprays into both nostrils daily as needed for allergies.     Historical Provider, MD  furosemide (LASIX) 20 MG tablet Take 1 tablet by mouth every morning. 01/31/16   Historical Provider, MD  gabapentin (NEURONTIN) 100 MG capsule Take 100 mg in the morning and at lunch; 300 mg at night. 02/12/16   Historical Provider, MD  Glucosamine-Chondroitin 750-600 MG TABS Take 1 tablet by mouth 2 (two) times daily.     Historical Provider, MD  Multiple Vitamin (MULTIVITAMIN WITH MINERALS) TABS tablet Take 1 tablet by mouth daily.    Historical Provider, MD  NON FORMULARY Apply 1 application topically at bedtime as needed (back pain). 5% ketamine, 2% baclofen, 5% gabapentin, 2.5% lidocaine, 2.5% prilocaine cream compounded by [facility]    Historical Provider, MD  Omega-3 Fatty Acids (FISH OIL) 1200 MG CAPS Take 1 capsule by mouth 2 (two) times daily.    Historical Provider, MD  pyridOXINE (VITAMIN B-6) 100 MG tablet Take 100 mg by mouth daily.    Historical Provider, MD  ranitidine (ZANTAC) 150 MG tablet Take 1 tablet by mouth at bedtime. 01/31/16   Historical Provider, MD  rosuvastatin (CRESTOR) 40 MG tablet TAKE 1  TABLET BY MOUTH AT BEDTIME. 05/08/16   Troy Sine, MD  SUMAtriptan (IMITREX) 25 MG tablet Take 25 mg by mouth as needed for migraine or headache. May repeat in 2 hours if headache persists or recurs.    Historical Provider, MD  traMADol (ULTRAM) 50 MG tablet Take 1 tablet (50 mg total) by mouth every 6 (six) hours as needed. 05/08/16   Mary Manifold, MD    Family History Family History  Problem Relation Age of Onset  . Dementia Mother   . Heart attack Father   . Heart disease Father   . Kidney failure Father   . Heart attack Brother     age 36  . Cancer - Other Brother     kidney    Social History Social History  Substance Use Topics  . Smoking status: Former Smoker    Types: Cigarettes  Quit date: 05/13/1988  . Smokeless tobacco: Never Used  . Alcohol use Yes     Comment: wine- rarely.     Allergies   Metoprolol and Sporanox [itraconazole]   Review of Systems Review of Systems  Constitutional: Negative for fever.  Gastrointestinal: Negative for nausea and vomiting.  Musculoskeletal: Positive for arthralgias. Negative for joint swelling.  Neurological: Negative for syncope and headaches.  All other systems reviewed and are negative.    Physical Exam Updated Vital Signs BP 131/62 (BP Location: Left Arm)   Pulse 64   Temp 98.3 F (36.8 C) (Oral)   Ht 5\' 5"  (1.651 m)   Wt 157 lb (71.2 kg)   SpO2 100%   BMI 26.13 kg/m   Physical Exam  Constitutional: She appears well-developed and well-nourished. No distress.  HENT:  Head: Normocephalic and atraumatic.  Mouth/Throat: Oropharynx is clear and moist. No oropharyngeal exudate.  Eyes: Conjunctivae and EOM are normal. Pupils are equal, round, and reactive to light. Right eye exhibits no discharge. Left eye exhibits no discharge. No scleral icterus.  Neck: Normal range of motion. Neck supple. No JVD present. No thyromegaly present.  Cardiovascular: Normal rate, regular rhythm, normal heart sounds and intact  distal pulses.  Exam reveals no gallop and no friction rub.   No murmur heard. Pulmonary/Chest: Effort normal and breath sounds normal. No respiratory distress. She has no wheezes. She has no rales.  Abdominal: Soft. Bowel sounds are normal. She exhibits no distension and no mass. There is no tenderness.  Musculoskeletal: Normal range of motion. She exhibits tenderness. She exhibits no edema.  Ecchymosis and tenderness to the mid aspect to the base of the first metacarpal. No tenderness in snuffbox. NVI.  Lymphadenopathy:    She has no cervical adenopathy.  Neurological: She is alert. Coordination normal.  Skin: Skin is warm and dry. No rash noted. No erythema.  Psychiatric: She has a normal mood and affect. Her behavior is normal.  Nursing note and vitals reviewed.    ED Treatments / Results   DIAGNOSTIC STUDIES: Oxygen Saturation is 100% on RA, normal by my interpretation.    COORDINATION OF CARE: 1:08 PM Discussed treatment plan with pt at bedside which includes right forearm and wrist imaging, pain medication and pt agreed to plan.   Labs (all labs ordered are listed, but only abnormal results are displayed) Labs Reviewed - No data to display  EKG  EKG Interpretation None       Radiology Dg Forearm Right  Result Date: 05/08/2016 CLINICAL DATA:  Injury. EXAM: RIGHT FOREARM - 2 VIEW COMPARISON:  No recent. FINDINGS: No acute bony or joint abnormality identified. No evidence of fracture or dislocation. IMPRESSION: No acute or focal abnormality . Electronically Signed   By: Marcello Moores  Register   On: 05/08/2016 12:33   Dg Wrist Complete Right  Result Date: 05/08/2016 CLINICAL DATA:  Fall. EXAM: RIGHT WRIST - COMPLETE 3+ VIEW COMPARISON:  No recent. FINDINGS: Diffuse degenerative change. No evidence of fracture or dislocation. No acute abnormality. IMPRESSION: Diffuse degenerative change.  No acute abnormality. Electronically Signed   By: Marcello Moores  Register   On: 05/08/2016 12:32     Procedures Procedures (including critical care time)  Medications Ordered in ED Medications - No data to display   Initial Impression / Assessment and Plan / ED Course  I have reviewed the triage vital signs and the nursing notes.  Pertinent labs & imaging results that were available during my care of the patient were  reviewed by me and considered in my medical decision making (see chart for details).  Clinical Course     66 year old female wrist pain after mechanical fall. Imaging negative for acute osseous abnormality. Closed injury. Neurovascular intact. Splint for comfort. As needed pain medication. RICE. Return precautions were discussed. Outpatient as needed otherwise.  Final Clinical Impressions(s) / ED Diagnoses   Final diagnoses:  Sprain of left wrist, initial encounter    New Prescriptions New Prescriptions   TRAMADOL (ULTRAM) 50 MG TABLET    Take 1 tablet (50 mg total) by mouth every 6 (six) hours as needed.    I personally preformed the services scribed in my presence. The recorded information has been reviewed is accurate. Mary Manifold, MD.     Mary Manifold, MD 05/10/16 [phone]

## 2016-08-01 ENCOUNTER — Ambulatory Visit (INDEPENDENT_AMBULATORY_CARE_PROVIDER_SITE_OTHER): Payer: Medicare Other | Admitting: Otolaryngology

## 2016-08-01 DIAGNOSIS — J343 Hypertrophy of nasal turbinates: Secondary | ICD-10-CM

## 2016-08-01 DIAGNOSIS — R05 Cough: Secondary | ICD-10-CM

## 2016-08-01 DIAGNOSIS — J310 Chronic rhinitis: Secondary | ICD-10-CM

## 2016-10-28 ENCOUNTER — Other Ambulatory Visit: Payer: Self-pay | Admitting: Cardiovascular Disease

## 2016-11-07 ENCOUNTER — Ambulatory Visit (HOSPITAL_COMMUNITY)
Admission: RE | Admit: 2016-11-07 | Discharge: 2016-11-07 | Disposition: A | Discharge status: Home / Self Care | Payer: Medicare Other | Source: Ambulatory Visit | Attending: Internal Medicine | Admitting: Internal Medicine

## 2016-11-07 ENCOUNTER — Other Ambulatory Visit (HOSPITAL_COMMUNITY): Payer: Self-pay | Admitting: Internal Medicine

## 2016-11-07 DIAGNOSIS — M25531 Pain in right wrist: Secondary | ICD-10-CM

## 2016-12-10 ENCOUNTER — Telehealth: Payer: Self-pay

## 2016-12-10 DIAGNOSIS — Z1211 Encounter for screening for malignant neoplasm of colon: Secondary | ICD-10-CM

## 2016-12-12 NOTE — Telephone Encounter (Signed)
Gastroenterology Pre-Procedure Review  Request Date:   12/10/2016 Requesting Physician: Dr. Gerarda Fraction  PATIENT REVIEW QUESTIONS: The patient responded to the following health history questions as indicated:    Last colonoscopy by Dr. Arnoldo Morale 02/07/2004  1. Diabetes Melitis: no 2. Joint replacements in the past 12 months: no 3. Major health problems in the past 3 months: no 4. Has an artificial valve or MVP: no 5. Has a defibrillator: no 6. Has been advised in past to take antibiotics in advance of a procedure like teeth cleaning: yes, when she had knee replacements in 2016 7. Family history of colon cancer: no  8. Alcohol Use: Rarely 9. History of sleep apnea: no  10. History of coronary artery or other vascular stents placed within the last 12 months: no 11. History of any prior anesthesia complications: no    MEDICATIONS & ALLERGIES:    Patient reports the following regarding taking any blood thinners:   Plavix? no Aspirin? no Coumadin? no Brilinta? no Xarelto? no Eliquis? no Pradaxa? no Savaysa? no Effient? no  Patient confirms/reports the following medications:  Current Outpatient Prescriptions  Medication Sig Dispense Refill  . acetaminophen (TYLENOL) 500 MG tablet Take 500-1,000 mg by mouth every 6 (six) hours as needed for moderate pain.    Marland Kitchen co-enzyme Q-10 30 MG capsule Take 200 mg by mouth daily.     . ferrous sulfate 325 (65 FE) MG tablet Take 1 tablet (325 mg total) by mouth 3 (three) times daily after meals. (Patient taking differently: Take 325 mg by mouth at bedtime. )  3  . fexofenadine (ALLEGRA) 180 MG tablet Take 180 mg by mouth daily.    . Glucosamine-Chondroitin 750-600 MG TABS Take 1 tablet by mouth 2 (two) times daily.     . magnesium gluconate (MAGONATE) 500 MG tablet Take 500 mg by mouth 2 (two) times daily.    . montelukast (SINGULAIR) 10 MG tablet Take 10 mg by mouth at bedtime.    . Multiple Vitamin (MULTIVITAMIN WITH MINERALS) TABS tablet Take 1 tablet  by mouth daily.    . NON FORMULARY CALCIUM CITRATE  1500 MG DAILY    . Omega-3 Fatty Acids (FISH OIL) 1200 MG CAPS Take 1 capsule by mouth 2 (two) times daily.    Marland Kitchen pyridOXINE (VITAMIN B-6) 100 MG tablet Take 100 mg by mouth daily.    . rosuvastatin (CRESTOR) 40 MG tablet TAKE 1 TABLET BY MOUTH AT BEDTIME. 90 tablet 1  . SUMAtriptan (IMITREX) 25 MG tablet Take 25 mg by mouth as needed for migraine or headache. May repeat in 2 hours if headache persists or recurs.    Marland Kitchen amLODipine (NORVASC) 10 MG tablet TAKE ONE TABLET BY MOUTH ONCE DAILY. (Patient not taking: Reported on 12/10/2016) 90 tablet 3  . benazepril (LOTENSIN) 10 MG tablet TAKE ONE TABLET BY MOUTH ONCE DAILY. (Patient not taking: Reported on 12/10/2016) 90 tablet 3  . calcium carbonate (TUMS - DOSED IN MG ELEMENTAL CALCIUM) 500 MG chewable tablet Chew 1 tablet by mouth 4 (four) times daily.    . fluticasone (FLONASE) 50 MCG/ACT nasal spray Place 2 sprays into both nostrils daily as needed for allergies.     . furosemide (LASIX) 20 MG tablet Take 1 tablet by mouth every morning.    . gabapentin (NEURONTIN) 100 MG capsule Take 100 mg in the morning and at lunch; 300 mg at night.    . NON FORMULARY Apply 1 application topically at bedtime as needed (back pain). 5% ketamine,  2% baclofen, 5% gabapentin, 2.5% lidocaine, 2.5% prilocaine cream compounded by [employer]    . ranitidine (ZANTAC) 150 MG tablet Take 1 tablet by mouth at bedtime.    . traMADol (ULTRAM) 50 MG tablet Take 1 tablet (50 mg total) by mouth every 6 (six) hours as needed. (Patient not taking: Reported on 12/10/2016) 8 tablet 0   No current facility-administered medications for this visit.     Patient confirms/reports the following allergies:  Allergies  Allergen Reactions  . Metoprolol     rash  . Sporanox [Itraconazole] Swelling    Bilateral knee and ankle swelling and pain     No orders of the defined types were placed in this encounter.   AUTHORIZATION  INFORMATION Primary Insurance:   ID #:   Group #:  Pre-Cert / Auth required:  Pre-Cert / Auth #:   Secondary Insurance:   ID #: Group #:  Pre-Cert / Auth required:  Pre-Cert / Auth #:   SCHEDULE INFORMATION: Procedure has been scheduled as follows:  Date:               Time:   Location:   This Gastroenterology Pre-Precedure Review Form is being routed to the following provider(s): Barney Drain, MD

## 2016-12-12 NOTE — Telephone Encounter (Signed)
Hold iron X 7 days prior. Phenergan 12.5 mg IV On call.

## 2016-12-17 NOTE — Telephone Encounter (Signed)
PT is out of town now. I will call her back after 12/24/2016.

## 2016-12-25 ENCOUNTER — Other Ambulatory Visit: Payer: Self-pay

## 2016-12-25 DIAGNOSIS — Z1211 Encounter for screening for malignant neoplasm of colon: Secondary | ICD-10-CM

## 2016-12-25 NOTE — Telephone Encounter (Signed)
PT is scheduled for 02/06/2017 at 1:15 pm with Dr. Oneida Alar.

## 2016-12-26 MED ORDER — NA SULFATE-K SULFATE-MG SULF 17.5-3.13-1.6 GM/177ML PO SOLN: 1.0000 | ORAL | 0 refills | Status: DC

## 2016-12-26 NOTE — Telephone Encounter (Signed)
Rx sent to the pharmacy and instructions mailed to pt.  

## 2017-01-01 ENCOUNTER — Telehealth: Payer: Self-pay | Admitting: Gastroenterology

## 2017-01-01 NOTE — Telephone Encounter (Signed)
Pt wants to reschedule her colonoscopy. Please call her back [phone]

## 2017-01-02 NOTE — Telephone Encounter (Signed)
Tried to call and line busy.

## 2017-01-02 NOTE — Telephone Encounter (Signed)
Pt rescheduled colonoscopy to 03/03/2017 at 8:30 Am with Dr. Oneida Alar.  Hoyle Sauer is aware in Endo. New instructions mailed to pt.

## 2017-01-07 NOTE — Telephone Encounter (Signed)
NO PA is needed for TCS 

## 2017-01-27 ENCOUNTER — Other Ambulatory Visit (HOSPITAL_COMMUNITY): Payer: Self-pay | Admitting: Internal Medicine

## 2017-01-27 DIAGNOSIS — Z1231 Encounter for screening mammogram for malignant neoplasm of breast: Secondary | ICD-10-CM

## 2017-01-28 ENCOUNTER — Encounter: Payer: Self-pay | Admitting: Cardiovascular Disease

## 2017-01-30 ENCOUNTER — Ambulatory Visit (INDEPENDENT_AMBULATORY_CARE_PROVIDER_SITE_OTHER): Payer: Medicare Other | Admitting: Otolaryngology

## 2017-01-30 DIAGNOSIS — R05 Cough: Secondary | ICD-10-CM | POA: Diagnosis not present

## 2017-01-30 DIAGNOSIS — J310 Chronic rhinitis: Secondary | ICD-10-CM

## 2017-03-03 ENCOUNTER — Encounter (HOSPITAL_COMMUNITY)
Admission: RE | Disposition: A | Discharge status: Home / Self Care | Payer: Self-pay | Source: Ambulatory Visit | Attending: Gastroenterology

## 2017-03-03 ENCOUNTER — Encounter (HOSPITAL_COMMUNITY): Payer: Self-pay | Admitting: *Deleted

## 2017-03-03 ENCOUNTER — Ambulatory Visit (HOSPITAL_COMMUNITY)
Admission: RE | Admit: 2017-03-03 | Discharge: 2017-03-03 | Disposition: A | Discharge status: Home / Self Care | Payer: Medicare Other | Source: Ambulatory Visit | Attending: Gastroenterology | Admitting: Gastroenterology

## 2017-03-03 DIAGNOSIS — A630 Anogenital (venereal) warts: Secondary | ICD-10-CM | POA: Insufficient documentation

## 2017-03-03 DIAGNOSIS — Z981 Arthrodesis status: Secondary | ICD-10-CM | POA: Diagnosis not present

## 2017-03-03 DIAGNOSIS — Z8249 Family history of ischemic heart disease and other diseases of the circulatory system: Secondary | ICD-10-CM | POA: Insufficient documentation

## 2017-03-03 DIAGNOSIS — K621 Rectal polyp: Secondary | ICD-10-CM | POA: Insufficient documentation

## 2017-03-03 DIAGNOSIS — K219 Gastro-esophageal reflux disease without esophagitis: Secondary | ICD-10-CM | POA: Insufficient documentation

## 2017-03-03 DIAGNOSIS — Q438 Other specified congenital malformations of intestine: Secondary | ICD-10-CM | POA: Insufficient documentation

## 2017-03-03 DIAGNOSIS — M1990 Unspecified osteoarthritis, unspecified site: Secondary | ICD-10-CM | POA: Diagnosis not present

## 2017-03-03 DIAGNOSIS — Z79899 Other long term (current) drug therapy: Secondary | ICD-10-CM | POA: Diagnosis not present

## 2017-03-03 DIAGNOSIS — D128 Benign neoplasm of rectum: Secondary | ICD-10-CM

## 2017-03-03 DIAGNOSIS — D123 Benign neoplasm of transverse colon: Secondary | ICD-10-CM | POA: Diagnosis not present

## 2017-03-03 DIAGNOSIS — D122 Benign neoplasm of ascending colon: Secondary | ICD-10-CM | POA: Insufficient documentation

## 2017-03-03 DIAGNOSIS — Z96653 Presence of artificial knee joint, bilateral: Secondary | ICD-10-CM | POA: Diagnosis not present

## 2017-03-03 DIAGNOSIS — K6282 Dysplasia of anus: Secondary | ICD-10-CM | POA: Insufficient documentation

## 2017-03-03 DIAGNOSIS — E785 Hyperlipidemia, unspecified: Secondary | ICD-10-CM | POA: Diagnosis not present

## 2017-03-03 DIAGNOSIS — K648 Other hemorrhoids: Secondary | ICD-10-CM | POA: Diagnosis not present

## 2017-03-03 DIAGNOSIS — Z87891 Personal history of nicotine dependence: Secondary | ICD-10-CM | POA: Insufficient documentation

## 2017-03-03 DIAGNOSIS — Z888 Allergy status to other drugs, medicaments and biological substances status: Secondary | ICD-10-CM | POA: Diagnosis not present

## 2017-03-03 DIAGNOSIS — D125 Benign neoplasm of sigmoid colon: Secondary | ICD-10-CM | POA: Insufficient documentation

## 2017-03-03 DIAGNOSIS — I341 Nonrheumatic mitral (valve) prolapse: Secondary | ICD-10-CM | POA: Insufficient documentation

## 2017-03-03 DIAGNOSIS — D120 Benign neoplasm of cecum: Secondary | ICD-10-CM | POA: Diagnosis not present

## 2017-03-03 DIAGNOSIS — Z860101 Personal history of adenomatous and serrated colon polyps: Secondary | ICD-10-CM

## 2017-03-03 DIAGNOSIS — Z1211 Encounter for screening for malignant neoplasm of colon: Secondary | ICD-10-CM | POA: Insufficient documentation

## 2017-03-03 HISTORY — PX: COLONOSCOPY: SHX5424

## 2017-03-03 LAB — CBC
HEMATOCRIT: 33.4 % — AB (ref 36.0–46.0)
Hemoglobin: 10.9 g/dL — ABNORMAL LOW (ref 12.0–15.0)
MCH: 29.9 pg (ref 26.0–34.0)
MCHC: 32.6 g/dL (ref 30.0–36.0)
MCV: 91.5 fL (ref 78.0–100.0)
PLATELETS: 221 10*3/uL (ref 150–400)
RBC: 3.65 MIL/uL — ABNORMAL LOW (ref 3.87–5.11)
RDW: 13.4 % (ref 11.5–15.5)
WBC: 4.5 10*3/uL (ref 4.0–10.5)

## 2017-03-03 LAB — FERRITIN: Ferritin: 23 ng/mL (ref 11–307)

## 2017-03-03 SURGERY — COLONOSCOPY
Anesthesia: Moderate Sedation

## 2017-03-03 MED ORDER — PROMETHAZINE HCL 25 MG/ML IJ SOLN
INTRAMUSCULAR | Status: AC
  Filled 2017-03-03: qty 1

## 2017-03-03 MED ORDER — MEPERIDINE HCL 100 MG/ML IJ SOLN
INTRAMUSCULAR | Status: DC | PRN
  Administered 2017-03-03: 25 mg via INTRAVENOUS

## 2017-03-03 MED ORDER — MIDAZOLAM HCL 5 MG/5ML IJ SOLN
INTRAMUSCULAR | Status: DC | PRN
  Administered 2017-03-03: 2 mg via INTRAVENOUS
  Administered 2017-03-03 (×2): 1 mg via INTRAVENOUS

## 2017-03-03 MED ORDER — MEPERIDINE HCL 100 MG/ML IJ SOLN
INTRAMUSCULAR | Status: AC
  Filled 2017-03-03: qty 2

## 2017-03-03 MED ORDER — SODIUM CHLORIDE 0.9 % IV SOLN
INTRAVENOUS | Status: DC
  Administered 2017-03-03: 08:00:00 via INTRAVENOUS

## 2017-03-03 MED ORDER — PROMETHAZINE HCL 25 MG/ML IJ SOLN: 6.2500 mg | Freq: Once | INTRAMUSCULAR | Status: DC

## 2017-03-03 MED ORDER — SODIUM CHLORIDE 0.9% FLUSH
INTRAVENOUS | Status: AC
  Filled 2017-03-03: qty 10

## 2017-03-03 MED ORDER — PROMETHAZINE HCL 25 MG/ML IJ SOLN: 12.5000 mg | Freq: Once | INTRAMUSCULAR | Status: DC

## 2017-03-03 MED ORDER — SIMETHICONE 40 MG/0.6ML PO SUSP
ORAL | Status: DC | PRN
  Administered 2017-03-03: 10:00:00

## 2017-03-03 MED ORDER — MIDAZOLAM HCL 5 MG/5ML IJ SOLN
INTRAMUSCULAR | Status: AC
  Filled 2017-03-03: qty 10

## 2017-03-03 NOTE — Discharge Instructions (Signed)
You had 7 polyps removed. You have  MODERATE internal hemorrhoids.    DRINK WATER TO KEEP YOUR URINE LIGHT YELLOW.  FOLLOW A HIGH FIBER DIET. AVOID ITEMS THAT CAUSE BLOATING & GAS. SEE INFO BELOW.  YOUR BIOPSY RESULTS WILL BE AVAILABLE IN MY CHART AFTER OCT 26 AND MY OFFICE WILL CONTACT YOU IN 10-14 DAYS WITH YOUR RESULTS.   Next colonoscopy in 3 years.    Colonoscopy Care After Read the instructions outlined below and refer to this sheet in the next week. These discharge instructions provide you with general information on caring for yourself after you leave the hospital. While your treatment has been planned according to the most current medical practices available, unavoidable complications occasionally occur. If you have any problems or questions after discharge, call DR. Destynie Toomey, [phone].  ACTIVITY  You may resume your regular activity, but move at a slower pace for the next 24 hours.   Take frequent rest periods for the next 24 hours.   Walking will help get rid of the air and reduce the bloated feeling in your belly (abdomen).   No driving for 24 hours (because of the medicine (anesthesia) used during the test).   You may shower.   Do not sign any important legal documents or operate any machinery for 24 hours (because of the anesthesia used during the test).    NUTRITION  Drink plenty of fluids.   You may resume your normal diet as instructed by your doctor.   Begin with a light meal and progress to your normal diet. Heavy or fried foods are harder to digest and may make you feel sick to your stomach (nauseated).   Avoid alcoholic beverages for 24 hours or as instructed.    MEDICATIONS  You may resume your normal medications.   WHAT YOU CAN EXPECT TODAY  Some feelings of bloating in the abdomen.   Passage of more gas than usual.   Spotting of blood in your stool or on the toilet paper  .  IF YOU HAD POLYPS REMOVED DURING THE COLONOSCOPY:  Eat a  soft diet IF YOU HAVE NAUSEA, BLOATING, ABDOMINAL PAIN, OR VOMITING.    FINDING OUT THE RESULTS OF YOUR TEST Not all test results are available during your visit. DR. Oneida Alar WILL CALL YOU WITHIN 14 DAYS OF YOUR PROCEDUE WITH YOUR RESULTS. Do not assume everything is normal if you have not heard from DR. Ollivander See, CALL HER OFFICE AT [phone].  SEEK IMMEDIATE MEDICAL ATTENTION AND CALL THE OFFICE: [phone] IF:  You have more than a spotting of blood in your stool.   Your belly is swollen (abdominal distention).   You are nauseated or vomiting.   You have a temperature over 101F.   You have abdominal pain or discomfort that is severe or gets worse throughout the day.   High-Fiber Diet A high-fiber diet changes your normal diet to include more whole grains, legumes, fruits, and vegetables. Changes in the diet involve replacing refined carbohydrates with unrefined foods. The calorie level of the diet is essentially unchanged. The Dietary Reference Intake (recommended amount) for adult males is 38 grams per day. For adult females, it is 25 grams per day. Pregnant and lactating women should consume 28 grams of fiber per day. Fiber is the intact part of a plant that is not broken down during digestion. Functional fiber is fiber that has been isolated from the plant to provide a beneficial effect in the body. PURPOSE  Increase stool bulk.  Ease and regulate bowel movements.   Lower cholesterol.   REDUCE RISK OF COLON CANCER  INDICATIONS THAT YOU NEED MORE FIBER  Constipation and hemorrhoids.   Uncomplicated diverticulosis (intestine condition) and irritable bowel syndrome.   Weight management.   As a protective measure against hardening of the arteries (atherosclerosis), diabetes, and cancer.   GUIDELINES FOR INCREASING FIBER IN THE DIET  Start adding fiber to the diet slowly. A gradual increase of about 5 more grams (2 slices of whole-wheat bread, 2 servings of most fruits  or vegetables, or 1 bowl of high-fiber cereal) per day is best. Too rapid an increase in fiber may result in constipation, flatulence, and bloating.   Drink enough water and fluids to keep your urine clear or pale yellow. Water, juice, or caffeine-free drinks are recommended. Not drinking enough fluid may cause constipation.   Eat a variety of high-fiber foods rather than one type of fiber.   Try to increase your intake of fiber through using high-fiber foods rather than fiber pills or supplements that contain small amounts of fiber.   The goal is to change the types of food eaten. Do not supplement your present diet with high-fiber foods, but replace foods in your present diet.   INCLUDE A VARIETY OF FIBER SOURCES  Replace refined and processed grains with whole grains, canned fruits with fresh fruits, and incorporate other fiber sources. White rice, white breads, and most bakery goods contain little or no fiber.   Brown whole-grain rice, buckwheat oats, and many fruits and vegetables are all good sources of fiber. These include: broccoli, Brussels sprouts, cabbage, cauliflower, beets, sweet potatoes, white potatoes (skin on), carrots, tomatoes, eggplant, squash, berries, fresh fruits, and dried fruits.   Cereals appear to be the richest source of fiber. Cereal fiber is found in whole grains and bran. Bran is the fiber-rich outer coat of cereal grain, which is largely removed in refining. In whole-grain cereals, the bran remains. In breakfast cereals, the largest amount of fiber is found in those with "bran" in their names. The fiber content is sometimes indicated on the label.   You may need to include additional fruits and vegetables each day.   In baking, for 1 cup white flour, you may use the following substitutions:   1 cup whole-wheat flour minus 2 tablespoons.   1/2 cup white flour plus 1/2 cup whole-wheat flour.   Polyps, Colon  A polyp is extra tissue that grows inside your body.  Colon polyps grow in the large intestine. The large intestine, also called the colon, is part of your digestive system. It is a long, hollow tube at the end of your digestive tract where your body makes and stores stool. Most polyps are not dangerous. They are benign. This means they are not cancerous. But over time, some types of polyps can turn into cancer. Polyps that are smaller than a pea are usually not harmful. But larger polyps could someday become or may already be cancerous. To be safe, doctors remove all polyps and test them.   WHO GETS POLYPS? Anyone can get polyps, but certain people are more likely than others. You may have a greater chance of getting polyps if:  You are over 50.   You have had polyps before.   Someone in your family has had polyps.   Someone in your family has had cancer of the large intestine.   Find out if someone in your family has had polyps. You may also be  more likely to get polyps if you:   Eat a lot of fatty foods   Smoke   Drink alcohol   Do not exercise  Eat too much   PREVENTION There is not one sure way to prevent polyps. You might be able to lower your risk of getting them if you:  Eat more fruits and vegetables and less fatty food.   Do not smoke.   Avoid alcohol.   Exercise every day.   Lose weight if you are overweight.   Eating more calcium and folate can also lower your risk of getting polyps. Some foods that are rich in calcium are milk, cheese, and broccoli. Some foods that are rich in folate are chickpeas, kidney beans, and spinach.   Hemorrhoids Hemorrhoids are dilated (enlarged) veins around the rectum. Sometimes clots will form in the veins. This makes them swollen and painful. These are called thrombosed hemorrhoids. Causes of hemorrhoids include:  Constipation.   Straining to have a bowel movement.   HEAVY LIFTING  HOME CARE INSTRUCTIONS  Eat a well balanced diet and drink 6 to 8 glasses of water every day to  avoid constipation. You may also use a bulk laxative.   Avoid straining to have bowel movements.   Keep anal area dry and clean.   Do not use a donut shaped pillow or sit on the toilet for long periods. This increases blood pooling and pain.   Move your bowels when your body has the urge; this will require less straining and will decrease pain and pressure.

## 2017-03-03 NOTE — Op Note (Signed)
[facility] Patient Name: Mary [name] Procedure Date: 03/03/2017 9:16 AM MRN: [id number] Date of Birth: 05-18-1950 Attending MD: Barney Drain MD, MD CSN: [id number] Age: 66 Admit Type: Outpatient Procedure:                Colonoscopy WITH COLD SNARE/BIOPSY AND SNARE                            CAUTERY POLYPECTOMY Indications:              Screening for colorectal malignant neoplasm Providers:                Barney Drain MD, MD, Janeece Riggers, RN, Selena Lesser, Randa Spike, Technician Referring MD:              Medicines:                Promethazine 6.25 mg IV, Meperidine 25 mg IV,                            Midazolam 4 mg IV Complications:            No immediate complications. Estimated Blood Loss:     Estimated blood loss was minimal. Procedure:                Pre-Anesthesia Assessment:                           - Prior to the procedure, a History and Physical                            was performed, and patient medications and                            allergies were reviewed. The patient's tolerance of                            previous anesthesia was also reviewed. The risks                            and benefits of the procedure and the sedation                            options and risks were discussed with the patient.                            All questions were answered, and informed consent                            was obtained. Prior Anticoagulants: The patient has                            taken no previous anticoagulant or antiplatelet  agents. ASA Grade Assessment: II - A patient with                            mild systemic disease. After reviewing the risks                            and benefits, the patient was deemed in                            satisfactory condition to undergo the procedure.                            After obtaining informed consent, the colonoscope      was passed under direct vision. Throughout the                            procedure, the patient's blood pressure, pulse, and                            oxygen saturations were monitored continuously. The                            EC-[id number] ([id number]) scope was introduced through                            the anus and advanced to the the cecum, identified                            by appendiceal orifice and ileocecal valve. The                            colonoscopy was technically difficult and complex                            due to a tortuous colon. Successful completion of                            the procedure was aided by increasing the dose of                            sedation medication, straightening and shortening                            the scope to obtain bowel loop reduction and                            COLOWRAP. The patient tolerated the procedure                            fairly well. The quality of the bowel preparation                            was excellent. The ileocecal valve, appendiceal  orifice, and rectum were photographed. Scope In: 9:32:10 AM Scope Out: 9:57:13 AM Scope Withdrawal Time: 0 hours 19 minutes 14 seconds  Total Procedure Duration: 0 hours 25 minutes 3 seconds  Findings:      Three sessile polyps were found in the rectum, ascending colon and       cecum. The polyps were 2 to 3 mm in size. These polyps were removed with       a cold biopsy forceps. Resection and retrieval were complete.      Three sessile polyps were found in the sigmoid colon(2) and ascending       colon. The polyps were 4 to 8 mm in size. These polyps were removed with       a hot snare. Resection and retrieval were complete.      A 4 mm polyp was found in the hepatic flexure. The polyp was sessile.       The polyp was removed with a cold biopsy forceps. The polyp was removed       with a cold snare. Resection and retrieval were complete.       The recto-sigmoid colon, sigmoid colon and descending colon were       significantly redundant.      Internal hemorrhoids were found during retroflexion. The hemorrhoids       were moderate.      A 4 mm polypoid lesion was found at the anus. The lesion was sessile. No       bleeding was present. This was biopsied with a cold forceps for       histology. Impression:               - Three 2 to 3 mm polyps in the rectum, in the                            ascending colon and in the cecum, removed with a                            cold biopsy forceps. Resected and retrieved.                           - Three 4 to 8 mm polyps in the sigmoid colon and                            in the ascending colon, removed with a hot snare.                            Resected and retrieved.                           - One 4 mm polyp at the hepatic flexure, removed                            with a cold snare and removed with a cold biopsy                            forceps. Resected and retrieved.                           -  Redundant colon.                           - Internal hemorrhoids.                           - Polypoid lesion at the anus. Biopsied. Moderate Sedation:      Moderate (conscious) sedation was administered by the endoscopy nurse       and supervised by the endoscopist. The following parameters were       monitored: oxygen saturation, heart rate, blood pressure, and response       to care. Total physician intraservice time was 47 minutes. Recommendation:           - Repeat colonoscopy in 3 years for surveillance.                           - High fiber diet.                           - Continue present medications.                           - Await pathology results.                           - Patient has a contact number available for                            emergencies. The signs and symptoms of potential                            delayed complications were discussed with the                             patient. Return to normal activities tomorrow.                            Written discharge instructions were provided to the                            patient. Procedure Code(s):        --- Professional ---                           [phone], Colonoscopy, flexible; with removal of                            tumor(s), polyp(s), or other lesion(s) by snare                            technique                           76160, 59, Colonoscopy, flexible; with biopsy,                            single or multiple  65993, Moderate sedation services provided by the                            same physician or other qualified health care                            professional performing the diagnostic or                            therapeutic service that the sedation supports,                            requiring the presence of an independent trained                            observer to assist in the monitoring of the                            patient's level of consciousness and physiological                            status; initial 15 minutes of intraservice time,                            patient age 83 years or older                           [phone], Moderate sedation services; each additional                            15 minutes intraservice time                           99153, Moderate sedation services; each additional                            15 minutes intraservice time Diagnosis Code(s):        --- Professional ---                           Z12.11, Encounter for screening for malignant                            neoplasm of colon                           K62.1, Rectal polyp                           D12.0, Benign neoplasm of cecum                           D12.5, Benign neoplasm of sigmoid colon                           D12.2, Benign neoplasm of ascending colon  D12.3, Benign neoplasm of transverse  colon (hepatic                            flexure or splenic flexure)                           D49.0, Neoplasm of unspecified behavior of                            digestive system                           K64.8, Other hemorrhoids                           Q43.8, Other specified congenital malformations of                            intestine CPT copyright 2016 [employer]. All rights reserved. The codes documented in this report are preliminary and upon coder review may  be revised to meet current compliance requirements. Barney Drain, MD Barney Drain MD, MD 03/03/2017 10:09:15 AM This report has been signed electronically. Number of Addenda: 0

## 2017-03-03 NOTE — H&P (Signed)
Primary Care Physician:  Redmond School, MD Primary Gastroenterologist:  Dr. Oneida Alar  Pre-Procedure History & Physical: HPI:  Mary [name] is a 66 y.o. female here for [place].  Past Medical History:  Diagnosis Date  . Anemia   . Arthritis   . Chronic kidney disease    idiopathic micro hematuria   . Complication of anesthesia    patient states does not take much medicine to put her to sleep   . Edema    ankles to knees   . GERD (gastroesophageal reflux disease)   . Headache(784.0)    hx of migraines   . Hyperlipidemia   . Mitral valve prolapse    no symptoms   . PONV (postoperative nausea and vomiting)   . Scalp alopecia     Past Surgical History:  Procedure Laterality Date  . CERVICAL FUSION  2006   cervical 5-6   . MYOCARDIAL PERFUSION STUDY  07/02/2011   NO SCINTIGRAPHIC EVIDENCE OF INDUCIBLE MYOCARDIAL ISCHEMIA. POST-STRESS EF IS 87%.EXERCISE CAPACITY 9 METS. EKG SHOWS NSR AT 69. THERE IS 1MM HORIZONTAL ST DEPRESSION WITH EXERCISE IN II, III, AVF SEEN BEST AT EARLY RECOVERY. THIS IS PRESENT IN V3-V5 AS WELL AND RETURNS TO BASELINE AT THE END OF THE TEST. NORMAL STUDY.  Marland Kitchen RENAL ARTERY DOPPLER  12/28/2003   CELIAC ARTERY: AT REST, 168.4 CM/S; INSPRIATION, 177.6 CM/S. ARCUATE LIGAMENT COMPRESSION SYNDROME. R & L KIDNEYS: RIGHT KIDNEY MEASURES SOME WHAT SMALLER THAN LEFT. KIDNEY ARE ESSENTIALLY SYMMETRICAL IN SHAPE WITH NO OBVIOUS ABNORMALITY VISUALIZED. R & L RENAL ARTERIES: DEMONSTRATE NORMAL SPECTRA. NO SUGGESTION OF DIAMETER REDUCTION, DISSECTION, FIBROMUSCULAR DYSPLASIA OR ANY OTHER VASCULAR ABNOR  . TOTAL KNEE ARTHROPLASTY Right 08/31/2013   Procedure: RIGHT TOTAL KNEE ARTHROPLASTY;  Surgeon: [name], MD;  Location: [facility];  Service: Orthopedics;  Laterality: Right;  . TOTAL KNEE ARTHROPLASTY Left 03/07/2015   Procedure: TOTAL KNEE ARTHROPLASTY;  Surgeon: [name], MD;  Location: [facility];  Service: Orthopedics;  Laterality: Left;  . TRANSTHORACIC  ECHOCARDIOGRAM  09/28/8414   LV SYSTOLIC FUNCTION NORMAL, EF=>55%, INSIGNIFICANT PERICARDIAL EFFUSION, LEFT ATRIAL SIZE IS NORMAL, RV SYSTOLIC PRESSURE IS NORMAL. NO SIGN VALVULAR HEART DISEASE.  . TUBAL LIGATION  1986  . veins stripping      bilateral legs     Prior to Admission medications   Medication Sig Start Date End Date Taking? Authorizing Provider  acetaminophen (TYLENOL) 500 MG tablet Take 500 mg by mouth 2 (two) times daily.    Yes [provider]  calcium carbonate (TUMS - DOSED IN MG ELEMENTAL CALCIUM) 500 MG chewable tablet Chew 1 tablet by mouth 3 (three) times daily.    Yes [provider]  candesartan (ATACAND) 32 MG tablet Take 32 mg by mouth daily.   Yes [provider]  Carboxymethylcellulose Sodium (THERATEARS OP) Place 1 drop into both eyes as needed (for dry eyes).   Yes [provider]  Coenzyme Q10 200 MG capsule Take 200 mg by mouth daily.    Yes [provider]  ferrous sulfate 325 (65 FE) MG tablet Take 1 tablet (325 mg total) by mouth 3 (three) times daily after meals. Patient taking differently: Take 325 mg by mouth at bedtime.  03/09/15  Yes Babish, Rodman Key, PA-C  fexofenadine (ALLEGRA) 180 MG tablet Take 180 mg by mouth daily.   Yes [provider]  Flaxseed, Linseed, (FLAX SEEDS PO) Take 1-2 capsules by mouth See admin instructions. Take 1 capsule by mouth in  the morning and take 2 capsules by mouth in the evening   Yes [provider]  Glucosamine-Chondroitin 750-600 MG TABS Take 1 tablet by mouth 2 (two) times daily.    Yes [provider]  ipratropium (ATROVENT) 0.06 % nasal spray Place 2 sprays into both nostrils 2 (two) times daily.   Yes [provider]  magnesium gluconate (MAGONATE) 500 MG tablet Take 500 mg by mouth daily.    Yes [provider]  montelukast (SINGULAIR) 10 MG tablet Take 10 mg by mouth at bedtime.   Yes [provider]  Multiple Vitamin  (MULTIVITAMIN WITH MINERALS) TABS tablet Take 1 tablet by mouth daily.   Yes [provider]  Na Sulfate-K Sulfate-Mg Sulf (SUPREP BOWEL PREP KIT) 17.5-3.13-1.6 GM/180ML SOLN Take 1 kit by mouth as directed. 12/26/16  Yes Quanesha Klimaszewski, Marga Melnick, MD  Omega-3 Fatty Acids (FISH OIL) 1200 MG CAPS Take 1,200 mg by mouth 2 (two) times daily.    Yes [provider]  pyridOXINE (VITAMIN B-6) 100 MG tablet Take 100 mg by mouth daily.   Yes [provider]  rosuvastatin (CRESTOR) 40 MG tablet TAKE 1 TABLET BY MOUTH AT BEDTIME. Patient taking differently: Take 40 mg by mouth at bedtime 10/28/16  Yes Troy Sine, MD  SUMAtriptan (IMITREX) 100 MG tablet Take 100 mg by mouth as needed for migraine or headache. May repeat in 2 hours if headache persists or recurs.    Yes [provider]  SUMAtriptan Succinate Refill (IMITREX STATDOSE REFILL) 4 MG/0.5ML SOCT Inject 4 mg into the skin as needed (for migraines).   Yes [provider]  amLODipine (NORVASC) 10 MG tablet TAKE ONE TABLET BY MOUTH ONCE DAILY. Patient not taking: Reported on 12/10/2016 04/15/16   Troy Sine, MD  benazepril (LOTENSIN) 10 MG tablet TAKE ONE TABLET BY MOUTH ONCE DAILY. Patient not taking: Reported on 12/10/2016 04/15/16   Troy Sine, MD  traMADol (ULTRAM) 50 MG tablet Take 1 tablet (50 mg total) by mouth every 6 (six) hours as needed. Patient not taking: Reported on 12/10/2016 05/08/16   [name], MD    Allergies as of 12/25/2016 - Review Complete 12/10/2016  Allergen Reaction Noted  . Metoprolol  02/28/2015  . Sporanox [itraconazole] Swelling 05/20/2013    Family History  Problem Relation Age of Onset  . Dementia Mother   . Heart attack Father   . Heart disease Father   . Kidney failure Father   . Heart attack Brother        age 64  . Cancer - Other Brother        kidney  . Colon cancer Paternal Aunt     Social History   Social History  . Marital status: Widowed     Spouse name: N/A  . Number of children: N/A  . Years of education: N/A   Occupational History  . Not on file.   Social History Main Topics  . Smoking status: Former Smoker    Types: Cigarettes    Quit date: 05/13/1988  . Smokeless tobacco: Never Used  . Alcohol use Yes     Comment: wine- rarely.  . Drug use: No  . Sexual activity: Not on file   Other Topics Concern  . Not on file   Social History Narrative  . No narrative on file    Review of Systems: See HPI, otherwise negative ROS   Physical Exam: BP 133/65   Pulse 80   Temp  97.8 F (36.6 C) (Oral)   Resp 15   Ht _0  (1.651 m)   Wt 149 lb (67.6 kg)   SpO2 100%   BMI 24.79 kg/m  General:   Alert,  pleasant and cooperative in NAD Head:  Normocephalic and atraumatic. Neck:  Supple; Lungs:  Clear throughout to auscultation.    Heart:  Regular rate and rhythm. Abdomen:  Soft, nontender and nondistended. Normal bowel sounds, without guarding, and without rebound.   Neurologic:  Alert and  oriented x4;  grossly normal neurologically.  Impression/Plan:    SCREENING  Plan:  1. TCS TODAY DISCUSSED PROCEDURE, BENEFITS, & RISKS: < 1% chance of medication reaction, bleeding, perforation, or rupture of spleen/liver.

## 2017-03-05 ENCOUNTER — Encounter: Payer: Self-pay | Admitting: Cardiovascular Disease

## 2017-03-05 ENCOUNTER — Ambulatory Visit (INDEPENDENT_AMBULATORY_CARE_PROVIDER_SITE_OTHER): Payer: Medicare Other | Admitting: Cardiovascular Disease

## 2017-03-05 VITALS — BP 130/76 | HR 69 | Ht 65.0 in | Wt 155.0 lb

## 2017-03-05 DIAGNOSIS — Z8669 Personal history of other diseases of the nervous system and sense organs: Secondary | ICD-10-CM | POA: Diagnosis not present

## 2017-03-05 DIAGNOSIS — I10 Essential (primary) hypertension: Secondary | ICD-10-CM

## 2017-03-05 DIAGNOSIS — E7800 Pure hypercholesterolemia, unspecified: Secondary | ICD-10-CM

## 2017-03-05 DIAGNOSIS — K219 Gastro-esophageal reflux disease without esophagitis: Secondary | ICD-10-CM | POA: Diagnosis not present

## 2017-03-05 MED ORDER — EZETIMIBE 10 MG PO TABS: 10.0000 mg | ORAL_TABLET | Freq: Every day | ORAL | 3 refills | Status: DC

## 2017-03-05 MED ORDER — AMLODIPINE BESYLATE 2.5 MG PO TABS: 2.5000 mg | ORAL_TABLET | Freq: Every day | ORAL | 3 refills | Status: DC

## 2017-03-05 NOTE — Progress Notes (Signed)
Patient ID: Mary [name], female   DOB: Apr 23, 1951, 66 y.o.   MRN: [id number]     HPI: Mary [name] is a 66 y.o. female presents to the office for a 12 month  cardiology evaluation.  Mary [name] has a long-standing history of migraine headaches,  hypertension, hyperlipidemia, GERD, and has strong family history for both coronary as well as peripheral vascular disease. She also has PVD with lower extremity venous abnormalities. Remotely, she had been on pravastatin for hyperlipidemia but this was switched to Crestor do to inadequate response to pravastatin.   When I saw her over one year ago, she was stable on amlodipine 10 mg, benazepril 10 mg for hypetension as well as her migraine headaches and Crestor 40 mg for hyperlipidemia.   She had developed a rash and stopped taking Toprol-XL 12.5 mg.  She denied any palpitations.  Following its discontinuance or awareness of blood pressure elevation.  Over the past year, she has been bothered by chronic bronchitis and allergic cough.  She also underwent left knee replacement in October 2016 after previously undergoing right knee replacement in April 2015.  She has seen Dr. Gerarda [name] in July and apparently complete set of blood work was done and she was told that these were within normal limits.  She denies any chest pain.  She is unaware of recurrent palpitations.  She denies presyncope or syncope.  She completed 3 courses of antibiotics.  She will be having a follow-up ENT evaluation later this week.  A chest x-ray on 02/12/2016 did not reveal any active cardiopulmonary disease.   Since I saw her one year ago, she has done fairly well.  She had developed a dry hacking cough and also was having swelling wall on amlodipine/benazepril combination 10/10.  Her neurologist who takes care of her migraines ultimately switched her to candesartan and she is now on 32 mg daily.  She believes her migraine frequency has increased with the change of medication.   Previously when she was on Lotrel.  She would experience for migraines per year and since February.  She now has experienced 15, although less intense than previously.  She underwent repeat blood work by Dr. Riley [name].  Her creatinine was 1.17.  She had normal LFTs.  Cholesterol was 174 with an LDL of 92 and triglycerides of 67.  She denies any episodes of chest pressure.  She denies palpitations.  She presents for one-year evaluation.  Past Medical History:  Diagnosis Date  . Anemia   . Arthritis   . Chronic kidney disease    idiopathic micro hematuria   . Complication of anesthesia    patient states does not take much medicine to put her to sleep   . Edema    ankles to knees   . GERD (gastroesophageal reflux disease)   . Headache(784.0)    hx of migraines   . Hyperlipidemia   . Mitral valve prolapse    no symptoms   . PONV (postoperative nausea and vomiting)   . Scalp alopecia     Past Surgical History:  Procedure Laterality Date  . CERVICAL FUSION  2006   cervical 5-6   . MYOCARDIAL PERFUSION STUDY  07/02/2011   NO SCINTIGRAPHIC EVIDENCE OF INDUCIBLE MYOCARDIAL ISCHEMIA. POST-STRESS EF IS 87%.EXERCISE CAPACITY 9 METS. EKG SHOWS NSR AT 69. THERE IS 1MM HORIZONTAL ST DEPRESSION WITH EXERCISE IN II, III, AVF SEEN BEST AT EARLY RECOVERY. THIS IS PRESENT IN V3-V5 AS WELL AND RETURNS TO BASELINE AT THE  END OF THE TEST. NORMAL STUDY.  Marland Kitchen RENAL ARTERY DOPPLER  12/28/2003   CELIAC ARTERY: AT REST, 168.4 CM/S; INSPRIATION, 177.6 CM/S. ARCUATE LIGAMENT COMPRESSION SYNDROME. R & L KIDNEYS: RIGHT KIDNEY MEASURES SOME WHAT SMALLER THAN LEFT. KIDNEY ARE ESSENTIALLY SYMMETRICAL IN SHAPE WITH NO OBVIOUS ABNORMALITY VISUALIZED. R & L RENAL ARTERIES: DEMONSTRATE NORMAL SPECTRA. NO SUGGESTION OF DIAMETER REDUCTION, DISSECTION, FIBROMUSCULAR DYSPLASIA OR ANY OTHER VASCULAR ABNOR  . TOTAL KNEE ARTHROPLASTY Right 08/31/2013   Procedure: RIGHT TOTAL KNEE ARTHROPLASTY;  Surgeon: [name], MD;  Location: [facility];  Service: Orthopedics;  Laterality: Right;  . TOTAL KNEE ARTHROPLASTY Left 03/07/2015   Procedure: TOTAL KNEE ARTHROPLASTY;  Surgeon: [name], MD;  Location: [facility];  Service: Orthopedics;  Laterality: Left;  . TRANSTHORACIC ECHOCARDIOGRAM  2/83/6629   LV SYSTOLIC FUNCTION NORMAL, EF=>55%, INSIGNIFICANT PERICARDIAL EFFUSION, LEFT ATRIAL SIZE IS NORMAL, RV SYSTOLIC PRESSURE IS NORMAL. NO SIGN VALVULAR HEART DISEASE.  . TUBAL LIGATION  1986  . veins stripping      bilateral legs     Allergies  Allergen Reactions  . Metoprolol Rash  . Sporanox [Itraconazole] Swelling and Other (See Comments)    Bilateral knee and ankle swelling and pain     Current Outpatient Prescriptions  Medication Sig Dispense Refill  . acetaminophen (TYLENOL) 500 MG tablet Take 500 mg by mouth 2 (two) times daily.     . calcium carbonate (TUMS - DOSED IN MG ELEMENTAL CALCIUM) 500 MG chewable tablet Chew 1 tablet by mouth 3 (three) times daily.     . candesartan (ATACAND) 32 MG tablet Take 32 mg by mouth daily.    . Carboxymethylcellulose Sodium (THERATEARS OP) Place 1 drop into both eyes as needed (for dry eyes).    . Coenzyme Q10 200 MG capsule Take 200 mg by mouth daily.     . Ferrous Sulfate (IRON) 325 (65 Fe) MG TABS Take 1 tablet by mouth 3 times/day as needed-between meals & bedtime.    . fexofenadine (ALLEGRA) 180 MG tablet Take 180 mg by mouth daily.    . Flaxseed, Linseed, (FLAX SEEDS PO) Take 1-2 capsules by mouth See admin instructions. Take 1 capsule by mouth in the morning and take 2 capsules by mouth in the evening    . Glucosamine-Chondroitin 750-600 MG TABS Take 1 tablet by mouth 2 (two) times daily.     Marland Kitchen ipratropium (ATROVENT) 0.06 % nasal spray Place 2 sprays into both nostrils 2 (two) times daily.    . magnesium gluconate (MAGONATE) 500 MG tablet Take 500 mg by mouth daily.     . montelukast (SINGULAIR) 10 MG tablet Take 10 mg by mouth at bedtime.    . Multiple Vitamin (MULTIVITAMIN WITH  MINERALS) TABS tablet Take 1 tablet by mouth daily.    . Omega-3 Fatty Acids (FISH OIL) 1200 MG CAPS Take 1,200 mg by mouth 2 (two) times daily.     Marland Kitchen pyridOXINE (VITAMIN B-6) 100 MG tablet Take 100 mg by mouth daily.    . rosuvastatin (CRESTOR) 40 MG tablet TAKE 1 TABLET BY MOUTH AT BEDTIME. (Patient taking differently: Take 40 mg by mouth at bedtime) 90 tablet 1  . SUMAtriptan (IMITREX) 100 MG tablet Take 100 mg by mouth as needed for migraine or headache. May repeat in 2 hours if headache persists or recurs.     . SUMAtriptan Succinate Refill (IMITREX STATDOSE REFILL) 4 MG/0.5ML SOCT Inject 4 mg into the skin as needed (for migraines).  No current facility-administered medications for this visit.     Social History   Social History  . Marital status: Widowed    Spouse name: N/A  . Number of children: N/A  . Years of education: N/A   Occupational History  . Not on file.   Social History Main Topics  . Smoking status: Former Smoker    Types: Cigarettes    Quit date: 05/13/1988  . Smokeless tobacco: Never Used  . Alcohol use Yes     Comment: wine- rarely.  . Drug use: No  . Sexual activity: Not on file   Other Topics Concern  . Not on file   Social History Narrative  . No narrative on file   Social history is notable that she is married and has remained active. She does water aerobics and also rides a bicycle. She does not smoke, but there is a remote history of tobacco use having quit approximately 27 years ago. She has 2 children. She is the sister-in-law of Dr. Adella Hare and is married to Dr. Daylene Posey wife's brother.  She is retired Chief Technology Officer and retired at age 49.  Family History  Problem Relation Age of Onset  . Dementia Mother   . Heart attack Father   . Heart disease Father   . Kidney failure Father   . Heart attack Brother        age 46  . Cancer - Other Brother        kidney  . Colon cancer Paternal Aunt    ROS General: Negative; No  fevers, chills, or night sweats;  HEENT: Negative; No changes in vision or hearing, sinus congestion, difficulty swallowing Pulmonary: Negative; No cough, wheezing, shortness of breath, hemoptysis Cardiovascular: See history of present illness GI: Negative; No nausea, vomiting, diarrhea, or abdominal pain GU: Negative; No dysuria, hematuria, or difficulty voiding Musculoskeletal: Negative; no myalgias, joint pain, or weakness Hematologic/Oncology: Negative; no easy bruising, bleeding Endocrine: Negative; no heat/cold intolerance; no diabetes Neuro: Positive for history of migraines, currently approximately 3-4 per year Skin: Negative; No rashes or skin lesions Psychiatric: Negative; No behavioral problems, depression Sleep: Negative; No snoring, daytime sleepiness, hypersomnolence, bruxism, restless legs, hypnogognic hallucinations, no cataplexy Other comprehensive 14 point system review is negative.   PE BP 130/76 (BP Location: Right Arm, Patient Position: Sitting)   Pulse 69   Ht _0  (1.651 m)   Wt 155 lb (70.3 kg)   BMI 25.79 kg/m    Repeat blood pressure by me was 136/78 supine and 130/76 standing.  Wt Readings from Last 3 Encounters:  03/05/17 155 lb (70.3 kg)  03/03/17 149 lb (67.6 kg)  05/08/16 157 lb (71.2 kg)   General: Alert, oriented, no distress.  Skin: normal turgor, no rashes, warm and dry HEENT: Normocephalic, atraumatic. Pupils equal round and reactive to light; sclera anicteric; extraocular muscles intact; Nose without nasal septal hypertrophy Mouth/Parynx benign; Mallinpatti scale 2 Neck: No JVD, no carotid bruits; normal carotid upstroke Lungs: clear to ausculatation and percussion; no wheezing or rales Chest wall: without tenderness to palpitation Heart: PMI not displaced, RRR, s1 s2 normal, 1/6 systolic murmur, no diastolic murmur, no rubs, gallops, thrills, or heaves Abdomen: soft, nontender; no hepatosplenomehaly, BS+; abdominal aorta nontender and not  dilated by palpation. Back: no CVA tenderness Pulses 2+ Musculoskeletal: full range of motion, normal strength, no joint deformities Extremities: no clubbing cyanosis or edema, Homan's sign negative  Neurologic: grossly nonfocal; Cranial nerves grossly wnl Psychologic: Normal mood and  affect  ECG (independently read by me): Normal sinus rhythm at 69 bpm.  Nonspecific ST changes.  PR interval 156 more seconds, QTc interval 390 ms.  October 2017 ECG (independently read by me): Normal sinus rhythm at 64 bpm.  July 2016 ECG (independently read by me):  Normal sinus rhythm at 72 bpm. Borderline left atrial enlargement.  January 2016 ECG (independently read by me): Normal sinus rhythm at 69 bpm.  Normal intervals.  No significant ST segment changes.  January 2015 ECG (independently read by me): Normal sinus rhythm with nonspecific ST-T changes. Heart rate 75 beats per minute.  LABS:  I personally reviewed the repeat laboratory done on 01/28/2017 by Dr. Gerarda [name].   BMP Latest Ref Rng & Units 03/09/2015 03/08/2015 02/28/2015  Glucose 65 - 99 mg/dL 143(H) 166(H) 96  BUN 6 - 20 mg/dL 21(H) 19 18  Creatinine 0.44 - 1.00 mg/dL 0.86 0.89 1.15(H)  Sodium 135 - 145 mmol/L 138 139 142  Potassium 3.5 - 5.1 mmol/L 4.3 4.8 3.9  Chloride 101 - 111 mmol/L 107 109 107  CO2 22 - 32 mmol/L _0 Calcium 8.9 - 10.3 mg/dL 8.2(L) 8.4(L) 9.4   Hepatic Function Latest Ref Rng & Units 12/29/2014 05/10/2014 05/17/2013  Total Protein 6.1 - 8.1 g/dL 6.8 6.9 6.9  Albumin 3.6 - 5.1 g/dL 3.9 4.1 4.0  AST 10 - 35 U/L _1 ALT 6 - 29 U/L _2 Alk Phosphatase 33 - 130 U/L 73 69 63  Total Bilirubin 0.2 - 1.2 mg/dL 0.4 0.4 0.4   CBC Latest Ref Rng & Units 03/03/2017 03/09/2015 03/08/2015  WBC 4.0 - 10.5 K/uL 4.5 10.2 12.7(H)  Hemoglobin 12.0 - 15.0 g/dL 10.9(L) 9.0(L) 10.3(L)  Hematocrit 36.0 - 46.0 % 33.4(L) 28.0(L) 31.9(L)  Platelets 150 - 400 K/uL 221 188 204   Lab Results  Component Value Date    MCV 91.5 03/03/2017   MCV 85.9 03/09/2015   MCV 87.2 03/08/2015   Lab Results  Component Value Date   TSH 2.014 12/29/2014   Lipid Panel     Component Value Date/Time   CHOL 173 12/29/2014 0852   TRIG 54 12/29/2014 0852   HDL 75 12/29/2014 0852   CHOLHDL 2.3 12/29/2014 0852   VLDL 11 12/29/2014 0852   LDLCALC 87 12/29/2014 0852     RADIOLOGY: No results found.  IMPRESSION:  No diagnosis found.  ASSESSMENT AND PLAN: Ms. Huffine is 66 year old female who has a long-standing history of hypertension as well as hyperlipidemia.  I last saw her, her blood pressure was controlled on amlodipine 10 mg him in as a pill 10 mg.  He had developed a nonproductive cough and her Lotrel was discontinued due to side effects, and she was started on candesartan and is now on maximum dose 32 mg daily.  This adjustment has seemed to affect her migraine frequency.  In the past.  She had developed some swelling on amlodipine, but was on maximum dose.  With her slight blood pressure elevation today based on new hypertensive guidelines, I have suggested that she try reinstituting amlodipine at 2.5 mg at bedtime, which would be helpful for blood pressure control and to see if this can also further decrease her migraine frequency.  I have suggested she follow-up with her migraine physician who is in [place].  I reviewed her recent laboratory.  She does have strong family history for both CAD and PVD.  Her LDL cholesterol was 92 despite  taking rosuvastatin 40 mg.  I reviewed with her recent data regarding subclinical atherosclerosis, regression trial data, as well as recent out-of-control data with more aggressive lipid-lowering with PCSK 9 inhibition.  In attempt to reduce her LDL cholesterol further.  I am adding Zetia 10 mg she will take along with her rosuvastatin 40 mg.  This should provide an additional 20-25% LDL reduction.  She is status post bilateral knee replacement surgery with her last being done in  2016.  She has a history of bronchitis.  She also has been taking Singulair and as needed Atrovent.  Her GERD is controlled.  I'm recommending follow-up laboratory in 3 months including a chemistry profile, lipid panel and willl check a hemoglobin A1c since she was recently concerned with her glucose mildly elevated at 101.  I will see her in 4 months for reevaluation.    Time spent: 25 minutes   Troy Sine, MD, [facility]  03/05/2017 9:59 AM

## 2017-03-05 NOTE — Patient Instructions (Addendum)
Medication Instructions:  START AMLODIPINE 2.5 MG AT BEDTIME  START ZETIA 10 MG DAILY   Labwork: FASTING LP/CMET/A1C IN 3 MONTHS   Testing/Procedures: NONE  Follow-Up: Your physician recommends that you schedule a follow-up appointment in: 4 month ov  If you need a refill on your cardiac medications before your next appointment, please call your pharmacy.

## 2017-03-06 ENCOUNTER — Encounter (HOSPITAL_COMMUNITY): Payer: Self-pay | Admitting: Gastroenterology

## 2017-03-06 ENCOUNTER — Ambulatory Visit (HOSPITAL_COMMUNITY)
Admission: RE | Admit: 2017-03-06 | Discharge: 2017-03-06 | Disposition: A | Discharge status: Home / Self Care | Payer: Medicare Other | Source: Ambulatory Visit | Attending: Internal Medicine | Admitting: Internal Medicine

## 2017-03-06 DIAGNOSIS — Z1231 Encounter for screening mammogram for malignant neoplasm of breast: Secondary | ICD-10-CM | POA: Diagnosis present

## 2017-03-11 NOTE — Progress Notes (Signed)
LMOM for a return call.  

## 2017-03-12 ENCOUNTER — Other Ambulatory Visit: Payer: Self-pay

## 2017-03-12 DIAGNOSIS — A630 Anogenital (venereal) warts: Secondary | ICD-10-CM

## 2017-03-21 ENCOUNTER — Telehealth: Payer: Self-pay | Admitting: Gastroenterology

## 2017-03-21 NOTE — Telephone Encounter (Signed)
Called patient TO DISCUSS RESULTS. NO ANSWER-LVM. SHE CAN CALL WITH QUESTIONS OR CONCERNS.

## 2017-05-07 ENCOUNTER — Other Ambulatory Visit: Payer: Self-pay | Admitting: Cardiovascular Disease

## 2017-05-26 ENCOUNTER — Telehealth: Payer: Self-pay | Admitting: Gastroenterology

## 2017-05-26 DIAGNOSIS — A630 Anogenital (venereal) warts: Secondary | ICD-10-CM

## 2017-05-26 NOTE — Addendum Note (Signed)
Addended by: Inge Rise on: 05/26/2017 01:35 PM   Modules accepted: Orders

## 2017-05-26 NOTE — Telephone Encounter (Signed)
Referral placed.

## 2017-05-26 NOTE — Telephone Encounter (Signed)
SAW DERMATOLOGY AND NO INTERVENTION PERFORMED. DISCUSSED WITH DR. Arnoldo Morale. REFER TO DR. Arnoldo Morale ONLY, Dx: ANAL CONDYLOMA.

## 2017-06-10 ENCOUNTER — Other Ambulatory Visit: Payer: Self-pay | Admitting: *Deleted

## 2017-06-12 ENCOUNTER — Encounter: Payer: Self-pay | Admitting: General Surgery

## 2017-06-12 ENCOUNTER — Ambulatory Visit: Payer: Medicare Other | Admitting: General Surgery

## 2017-06-12 VITALS — BP 104/64 | HR 76 | Temp 97.7°F | Ht 65.0 in | Wt 158.0 lb

## 2017-06-12 DIAGNOSIS — A630 Anogenital (venereal) warts: Secondary | ICD-10-CM

## 2017-06-12 NOTE — Patient Instructions (Signed)
Genital Warts Genital warts are a common STD (sexually transmitted disease). They may appear as small bumps on the tissues of the genital area or anal area. Sometimes, they can become irritated and cause pain. Genital warts are easily passed to other people through sexual contact. Getting treatment is important because genital warts can lead to other problems. In females, the virus that causes genital warts may increase the risk of cervical cancer. What are the causes? Genital warts are caused by a virus that is called human papillomavirus (HPV). HPV is spread by having unprotected sex with an infected person. It can be spread through vaginal, anal, and oral sex. Many people do not know that they are infected. They may be infected for years without problems. However, even if they do not have problems, they can pass the infection to their sexual partners. What increases the risk? Genital warts are more likely to develop in:  People who have unprotected sex.  People who have multiple sexual partners.  People who become sexually active before they are 69 years of age.  Men who are not circumcised.  Women who have a female sexual partner who is not circumcised.  People who have a weakened body defense system (immune system) due to disease or medicine.  People who smoke.  What are the signs or symptoms? Symptoms of genital warts include:  Small growths in the genital area or anal area. These warts often grow in clusters.  Itching and irritation in the genital area or anal area.  Bleeding from the warts.  Painful sexual intercourse.  How is this diagnosed? Genital warts can usually be diagnosed from their appearance on the vagina, vulva, penis, perineum, anus, or rectum. Tests may also be done, such as:  Biopsy. A tissue sample is removed so it can be looked at under a microscope.  Colposcopy. In females, a magnifying tool is used to examine the vagina and cervix. Certain solutions may  be used to make the HPV cells change color so they can be seen more easily.  A Pap test in females.  Tests for other STDs.  How is this treated? Treatment for genital warts may include:  Applying prescription medicines to the warts. These may be solutions or creams.  Freezing the warts with liquid nitrogen (cryotherapy).  Burning the warts with: ? Laser treatment. ? An electrified probe (electrocautery).  Injecting a substance (Candida antigen or Trichophyton antigen) into the warts to help the body's immune system to fight off the warts.  Interferon injections.  Surgery to remove the warts.  Follow these instructions at home: Medicines  Apply over-the-counter and prescription medicines only as told by your health care provider.  Do not treat genital warts with medicines that are used for treating hand warts.  Talk with your health care provider about using over-the-counter anti-itch creams. General instructions  Do not touch or scratch the warts.  Do not have sex until your treatment has been completed.  Tell your current and past sexual partners about your condition because they may also need treatment.  Keep all follow-up visits as told by your health care provider. This is important.  After treatment, use condoms during sex to prevent future infections. Other Instructions for Women  Women who have genital warts might need increased screening for cervical cancer. This type of cancer is slow growing and can be cured if it is found early. Chances of developing cervical cancer are increased with HPV.  If you become pregnant, tell your health   care provider that you have had HPV. Your health care provider will monitor you closely during pregnancy to be sure that your baby is safe. How is this prevented? Talk with your health care provider about getting the HPV vaccines. These vaccines prevent some HPV infections and cancers. It is recommended that the vaccine be given to  males and females who are 9-26 years of age. It will not work if you already have HPV, and it is not recommended for pregnant women. Contact a health care provider if:  You have redness, swelling, or pain in the area of the treated skin.  You have a fever.  You feel generally ill.  You feel lumps in and around your genital area or anal area.  You have bleeding in your genital area or anal area.  You have pain during sexual intercourse. This information is not intended to replace advice given to you by your health care provider. Make sure you discuss any questions you have with your health care provider. Document Released: 04/26/2000 Document Revised: 10/05/2015 Document Reviewed: 07/25/2014 Elsevier Interactive Patient Education  2018 [employer]  

## 2017-06-12 NOTE — Progress Notes (Signed)
Mary [name]; [id number]; Dec 14, 1950   HPI Patient is a 67 year old white female who was referred to my care by Dr. Oneida Alar for evaluation and treatment of an anal condyloma found on colonoscopy done last fall.  Patient has been asymptomatic.  She denies any symptoms or rectal pain.  She has 0 out of 10 pain. Past Medical History:  Diagnosis Date  . Anemia   . Arthritis   . Chronic kidney disease    idiopathic micro hematuria   . Complication of anesthesia    patient states does not take much medicine to put her to sleep   . Edema    ankles to knees   . GERD (gastroesophageal reflux disease)   . Headache(784.0)    hx of migraines   . Hyperlipidemia   . Mitral valve prolapse    no symptoms   . PONV (postoperative nausea and vomiting)   . Scalp alopecia     Past Surgical History:  Procedure Laterality Date  . CERVICAL FUSION  2006   cervical 5-6   . COLONOSCOPY N/A 03/03/2017   Procedure: COLONOSCOPY;  Surgeon: Danie Binder, MD;  Location: [facility];  Service: Endoscopy;  Laterality: N/A;  1:15 pm  . MYOCARDIAL PERFUSION STUDY  07/02/2011   NO SCINTIGRAPHIC EVIDENCE OF INDUCIBLE MYOCARDIAL ISCHEMIA. POST-STRESS EF IS 87%.EXERCISE CAPACITY 9 METS. EKG SHOWS NSR AT 69. THERE IS 1MM HORIZONTAL ST DEPRESSION WITH EXERCISE IN II, III, AVF SEEN BEST AT EARLY RECOVERY. THIS IS PRESENT IN V3-V5 AS WELL AND RETURNS TO BASELINE AT THE END OF THE TEST. NORMAL STUDY.  Marland Kitchen RENAL ARTERY DOPPLER  12/28/2003   CELIAC ARTERY: AT REST, 168.4 CM/S; INSPRIATION, 177.6 CM/S. ARCUATE LIGAMENT COMPRESSION SYNDROME. R & L KIDNEYS: RIGHT KIDNEY MEASURES SOME WHAT SMALLER THAN LEFT. KIDNEY ARE ESSENTIALLY SYMMETRICAL IN SHAPE WITH NO OBVIOUS ABNORMALITY VISUALIZED. R & L RENAL ARTERIES: DEMONSTRATE NORMAL SPECTRA. NO SUGGESTION OF DIAMETER REDUCTION, DISSECTION, FIBROMUSCULAR DYSPLASIA OR ANY OTHER VASCULAR ABNOR  . TOTAL KNEE ARTHROPLASTY Right 08/31/2013   Procedure: RIGHT TOTAL KNEE ARTHROPLASTY;   Surgeon: [name], MD;  Location: [facility];  Service: Orthopedics;  Laterality: Right;  . TOTAL KNEE ARTHROPLASTY Left 03/07/2015   Procedure: TOTAL KNEE ARTHROPLASTY;  Surgeon: [name], MD;  Location: [facility];  Service: Orthopedics;  Laterality: Left;  . TRANSTHORACIC ECHOCARDIOGRAM  0/02/9322   LV SYSTOLIC FUNCTION NORMAL, EF=>55%, INSIGNIFICANT PERICARDIAL EFFUSION, LEFT ATRIAL SIZE IS NORMAL, RV SYSTOLIC PRESSURE IS NORMAL. NO SIGN VALVULAR HEART DISEASE.  . TUBAL LIGATION  1986  . veins stripping      bilateral legs     Family History  Problem Relation Age of Onset  . Dementia Mother   . Heart attack Father   . Heart disease Father   . Kidney failure Father   . Heart attack Brother        age 16  . Cancer - Other Brother        kidney  . Colon cancer Paternal Aunt     Current Outpatient Medications on File Prior to Visit  Medication Sig Dispense Refill  . acetaminophen (TYLENOL) 500 MG tablet Take 500 mg by mouth 2 (two) times daily.     Marland Kitchen amLODipine (NORVASC) 2.5 MG tablet Take 1 tablet (2.5 mg total) by mouth daily. 90 tablet 3  . calcium carbonate (TUMS - DOSED IN MG ELEMENTAL CALCIUM) 500 MG chewable tablet Chew 1 tablet by mouth 3 (three) times daily.     Marland Kitchen  candesartan (ATACAND) 32 MG tablet Take 32 mg by mouth daily.    . Carboxymethylcellulose Sodium (THERATEARS OP) Place 1 drop into both eyes as needed (for dry eyes).    . Coenzyme Q10 200 MG capsule Take 200 mg by mouth daily.     Marland Kitchen ezetimibe (ZETIA) 10 MG tablet Take 1 tablet (10 mg total) by mouth daily. 90 tablet 3  . Ferrous Sulfate (IRON) 325 (65 Fe) MG TABS Take 1 tablet by mouth 3 times/day as needed-between meals & bedtime.    . fexofenadine (ALLEGRA) 180 MG tablet Take 180 mg by mouth daily.    . Flaxseed, Linseed, (FLAX SEEDS PO) Take 1-2 capsules by mouth See admin instructions. Take 1 capsule by mouth in the morning and take 2 capsules by mouth in the evening    . Glucosamine-Chondroitin 750-600 MG  TABS Take 1 tablet by mouth 2 (two) times daily.     Marland Kitchen ipratropium (ATROVENT) 0.06 % nasal spray Place 2 sprays into both nostrils 2 (two) times daily.    . magnesium gluconate (MAGONATE) 500 MG tablet Take 500 mg by mouth daily.     . montelukast (SINGULAIR) 10 MG tablet Take 10 mg by mouth at bedtime.    . Multiple Vitamin (MULTIVITAMIN WITH MINERALS) TABS tablet Take 1 tablet by mouth daily.    . Omega-3 Fatty Acids (FISH OIL) 1200 MG CAPS Take 1,200 mg by mouth 2 (two) times daily.     Marland Kitchen pyridOXINE (VITAMIN B-6) 100 MG tablet Take 100 mg by mouth daily.    . rosuvastatin (CRESTOR) 40 MG tablet TAKE 1 TABLET BY MOUTH AT BEDTIME. 90 tablet 2  . SUMAtriptan (IMITREX) 100 MG tablet Take 100 mg by mouth as needed for migraine or headache. May repeat in 2 hours if headache persists or recurs.     . SUMAtriptan Succinate Refill (IMITREX STATDOSE REFILL) 4 MG/0.5ML SOCT Inject 4 mg into the skin as needed (for migraines).     No current facility-administered medications on file prior to visit.     Allergies  Allergen Reactions  . Metoprolol Rash  . Sporanox [Itraconazole] Swelling and Other (See Comments)    Bilateral knee and ankle swelling and pain     Social History   Substance and Sexual Activity  Alcohol Use Yes   Comment: wine- rarely.    Social History   Tobacco Use  Smoking Status Former Smoker  . Types: Cigarettes  . Last attempt to quit: 05/13/1988  . Years since quitting: 29.1  Smokeless Tobacco Never Used    Review of Systems  Constitutional: Negative.   HENT: Positive for sinus pain.   Eyes: Positive for blurred vision and double vision.  Respiratory: Positive for cough.   Cardiovascular: Negative.   Gastrointestinal: Positive for heartburn.  Genitourinary: Positive for frequency.  Musculoskeletal: Positive for back pain, joint pain and neck pain.  Skin: Negative.   Neurological: Negative.   Endo/Heme/Allergies: Negative.     Objective   Vitals:    06/12/17 1113  BP: 104/64  Pulse: 76  Temp: 97.7 F (36.5 C)    Physical Exam  Constitutional: She is oriented to person, place, and time and well-developed, well-nourished, and in no distress.  HENT:  Head: Normocephalic and atraumatic.  Cardiovascular: Normal rate, regular rhythm and normal heart sounds. Exam reveals no gallop and no friction rub.  No murmur heard. Pulmonary/Chest: Effort normal and breath sounds normal. No respiratory distress. She has no wheezes. She has no rales.  Genitourinary:  Genitourinary Comments: Anal examination reveals no external lesions noted at the anal verge.  No obvious mass noted on digital examination.  No blood present.  Sphincter tone normal.  Neurological: She is alert and oriented to person, place, and time.  Skin: Skin is warm and dry.  Vitals reviewed.  Colonoscopy and pathology report reviewed Assessment  Anal condyloma Plan   Patient is scheduled for anoscopy under anesthesia with removal of condyloma on 06/23/2017.  The risks and benefits of the procedure including bleeding, and infection, and recurrence of the lesion were fully explained to the patient, who gave informed consent.

## 2017-06-12 NOTE — H&P (Signed)
Mary [name]; [id number]; 1950-07-08   HPI Patient is a 67 year old white female who was referred to my care by Dr. Oneida Alar for evaluation and treatment of an anal condyloma found on colonoscopy done last fall.  Patient has been asymptomatic.  She denies any symptoms or rectal pain.  She has 0 out of 10 pain. Past Medical History:  Diagnosis Date  . Anemia   . Arthritis   . Chronic kidney disease    idiopathic micro hematuria   . Complication of anesthesia    patient states does not take much medicine to put her to sleep   . Edema    ankles to knees   . GERD (gastroesophageal reflux disease)   . Headache(784.0)    hx of migraines   . Hyperlipidemia   . Mitral valve prolapse    no symptoms   . PONV (postoperative nausea and vomiting)   . Scalp alopecia     Past Surgical History:  Procedure Laterality Date  . CERVICAL FUSION  2006   cervical 5-6   . COLONOSCOPY N/A 03/03/2017   Procedure: COLONOSCOPY;  Surgeon: Danie Binder, MD;  Location: [facility];  Service: Endoscopy;  Laterality: N/A;  1:15 pm  . MYOCARDIAL PERFUSION STUDY  07/02/2011   NO SCINTIGRAPHIC EVIDENCE OF INDUCIBLE MYOCARDIAL ISCHEMIA. POST-STRESS EF IS 87%.EXERCISE CAPACITY 9 METS. EKG SHOWS NSR AT 69. THERE IS 1MM HORIZONTAL ST DEPRESSION WITH EXERCISE IN II, III, AVF SEEN BEST AT EARLY RECOVERY. THIS IS PRESENT IN V3-V5 AS WELL AND RETURNS TO BASELINE AT THE END OF THE TEST. NORMAL STUDY.  Marland Kitchen RENAL ARTERY DOPPLER  12/28/2003   CELIAC ARTERY: AT REST, 168.4 CM/S; INSPRIATION, 177.6 CM/S. ARCUATE LIGAMENT COMPRESSION SYNDROME. R & L KIDNEYS: RIGHT KIDNEY MEASURES SOME WHAT SMALLER THAN LEFT. KIDNEY ARE ESSENTIALLY SYMMETRICAL IN SHAPE WITH NO OBVIOUS ABNORMALITY VISUALIZED. R & L RENAL ARTERIES: DEMONSTRATE NORMAL SPECTRA. NO SUGGESTION OF DIAMETER REDUCTION, DISSECTION, FIBROMUSCULAR DYSPLASIA OR ANY OTHER VASCULAR ABNOR  . TOTAL KNEE ARTHROPLASTY Right 08/31/2013   Procedure: RIGHT TOTAL KNEE ARTHROPLASTY;   Surgeon: [name], MD;  Location: [facility];  Service: Orthopedics;  Laterality: Right;  . TOTAL KNEE ARTHROPLASTY Left 03/07/2015   Procedure: TOTAL KNEE ARTHROPLASTY;  Surgeon: [name], MD;  Location: [facility];  Service: Orthopedics;  Laterality: Left;  . TRANSTHORACIC ECHOCARDIOGRAM  08/19/8117   LV SYSTOLIC FUNCTION NORMAL, EF=>55%, INSIGNIFICANT PERICARDIAL EFFUSION, LEFT ATRIAL SIZE IS NORMAL, RV SYSTOLIC PRESSURE IS NORMAL. NO SIGN VALVULAR HEART DISEASE.  . TUBAL LIGATION  1986  . veins stripping      bilateral legs     Family History  Problem Relation Age of Onset  . Dementia Mother   . Heart attack Father   . Heart disease Father   . Kidney failure Father   . Heart attack Brother        age 52  . Cancer - Other Brother        kidney  . Colon cancer Paternal Aunt     Current Outpatient Medications on File Prior to Visit  Medication Sig Dispense Refill  . acetaminophen (TYLENOL) 500 MG tablet Take 500 mg by mouth 2 (two) times daily.     Marland Kitchen amLODipine (NORVASC) 2.5 MG tablet Take 1 tablet (2.5 mg total) by mouth daily. 90 tablet 3  . calcium carbonate (TUMS - DOSED IN MG ELEMENTAL CALCIUM) 500 MG chewable tablet Chew 1 tablet by mouth 3 (three) times daily.     Marland Kitchen  candesartan (ATACAND) 32 MG tablet Take 32 mg by mouth daily.    . Carboxymethylcellulose Sodium (THERATEARS OP) Place 1 drop into both eyes as needed (for dry eyes).    . Coenzyme Q10 200 MG capsule Take 200 mg by mouth daily.     Marland Kitchen ezetimibe (ZETIA) 10 MG tablet Take 1 tablet (10 mg total) by mouth daily. 90 tablet 3  . Ferrous Sulfate (IRON) 325 (65 Fe) MG TABS Take 1 tablet by mouth 3 times/day as needed-between meals & bedtime.    . fexofenadine (ALLEGRA) 180 MG tablet Take 180 mg by mouth daily.    . Flaxseed, Linseed, (FLAX SEEDS PO) Take 1-2 capsules by mouth See admin instructions. Take 1 capsule by mouth in the morning and take 2 capsules by mouth in the evening    . Glucosamine-Chondroitin 750-600 MG  TABS Take 1 tablet by mouth 2 (two) times daily.     Marland Kitchen ipratropium (ATROVENT) 0.06 % nasal spray Place 2 sprays into both nostrils 2 (two) times daily.    . magnesium gluconate (MAGONATE) 500 MG tablet Take 500 mg by mouth daily.     . montelukast (SINGULAIR) 10 MG tablet Take 10 mg by mouth at bedtime.    . Multiple Vitamin (MULTIVITAMIN WITH MINERALS) TABS tablet Take 1 tablet by mouth daily.    . Omega-3 Fatty Acids (FISH OIL) 1200 MG CAPS Take 1,200 mg by mouth 2 (two) times daily.     Marland Kitchen pyridOXINE (VITAMIN B-6) 100 MG tablet Take 100 mg by mouth daily.    . rosuvastatin (CRESTOR) 40 MG tablet TAKE 1 TABLET BY MOUTH AT BEDTIME. 90 tablet 2  . SUMAtriptan (IMITREX) 100 MG tablet Take 100 mg by mouth as needed for migraine or headache. May repeat in 2 hours if headache persists or recurs.     . SUMAtriptan Succinate Refill (IMITREX STATDOSE REFILL) 4 MG/0.5ML SOCT Inject 4 mg into the skin as needed (for migraines).     No current facility-administered medications on file prior to visit.     Allergies  Allergen Reactions  . Metoprolol Rash  . Sporanox [Itraconazole] Swelling and Other (See Comments)    Bilateral knee and ankle swelling and pain     Social History   Substance and Sexual Activity  Alcohol Use Yes   Comment: wine- rarely.    Social History   Tobacco Use  Smoking Status Former Smoker  . Types: Cigarettes  . Last attempt to quit: 05/13/1988  . Years since quitting: 29.1  Smokeless Tobacco Never Used    Review of Systems  Constitutional: Negative.   HENT: Positive for sinus pain.   Eyes: Positive for blurred vision and double vision.  Respiratory: Positive for cough.   Cardiovascular: Negative.   Gastrointestinal: Positive for heartburn.  Genitourinary: Positive for frequency.  Musculoskeletal: Positive for back pain, joint pain and neck pain.  Skin: Negative.   Neurological: Negative.   Endo/Heme/Allergies: Negative.     Objective   Vitals:    06/12/17 1113  BP: 104/64  Pulse: 76  Temp: 97.7 F (36.5 C)    Physical Exam  Constitutional: She is oriented to person, place, and time and well-developed, well-nourished, and in no distress.  HENT:  Head: Normocephalic and atraumatic.  Cardiovascular: Normal rate, regular rhythm and normal heart sounds. Exam reveals no gallop and no friction rub.  No murmur heard. Pulmonary/Chest: Effort normal and breath sounds normal. No respiratory distress. She has no wheezes. She has no rales.  Genitourinary:  Genitourinary Comments: Anal examination reveals no external lesions noted at the anal verge.  No obvious mass noted on digital examination.  No blood present.  Sphincter tone normal.  Neurological: She is alert and oriented to person, place, and time.  Skin: Skin is warm and dry.  Vitals reviewed.  Colonoscopy and pathology report reviewed Assessment  Anal condyloma Plan   Patient is scheduled for anoscopy under anesthesia with removal of condyloma on 06/23/2017.  The risks and benefits of the procedure including bleeding, and infection, and recurrence of the lesion were fully explained to the patient, who gave informed consent.

## 2017-06-16 NOTE — Patient Instructions (Signed)
Mary [name]  06/16/2017     @PREFPERIOPPHARMACY @   Your procedure is scheduled on  06/23/2017  Report to [facility] at  700   A.M.  Call this number if you have problems the morning of surgery:  [phone]   Remember:  Do not eat food or drink liquids after midnight.  Take these medicines the morning of surgery with A SIP OF WATER  Atacand, allegra, imitrex.   Do not wear jewelry, make-up or nail polish.  Do not wear lotions, powders, or perfumes, or deodorant.  Do not shave 48 hours prior to surgery.  Men may shave face and neck.  Do not bring valuables to the hospital.  [facility] is not responsible for any belongings or valuables.  Contacts, dentures or bridgework may not be worn into surgery.  Leave your suitcase in the car.  After surgery it may be brought to your room.  For patients admitted to the hospital, discharge time will be determined by your treatment team.  Patients discharged the day of surgery will not be allowed to drive home.   Name and phone number of your driver:   family Special instructions:  None  Please read over the following fact sheets that you were given. Anesthesia Post-op Instructions and Care and Recovery After Surgery      Excision of Skin Lesions Excision of a skin lesion refers to the removal of a section of skin by making small cuts (incisions) in the skin. This procedure may be done to remove a cancerous (malignant) or noncancerous (benign) growth on the skin. It is typically done to treat or prevent cancer or infection. It may also be done to improve cosmetic appearance. The procedure may be done to remove:  Cancerous growths, such as basal cell carcinoma, squamous cell carcinoma, or melanoma.  Noncancerous growths, such as a cyst or lipoma.  Growths, such as moles or skin tags, which may be removed for cosmetic reasons.  Various excision or surgical techniques may be used depending on your condition, the  location of the lesion, and your overall health. Tell a health care provider about:  Any allergies you have.  All medicines you are taking, including vitamins, herbs, eye drops, creams, and over-the-counter medicines.  Any problems you or family members have had with anesthetic medicines.  Any blood disorders you have.  Any surgeries you have had.  Any medical conditions you have.  Whether you are pregnant or may be pregnant. What are the risks? Generally, this is a safe procedure. However, problems may occur, including:  Bleeding.  Infection.  Scarring.  Recurrence of the cyst, lipoma, or cancer.  Changes in skin sensation or appearance, such as discoloration or swelling.  Reaction to the anesthetics.  Allergic reaction to surgical materials or ointments.  Damage to nerves, blood vessels, muscles, or other structures.  Continued pain.  What happens before the procedure?  Ask your health care provider about: ? Changing or stopping your regular medicines. This is especially important if you are taking diabetes medicines or blood thinners. ? Taking medicines such as aspirin and ibuprofen. These medicines can thin your blood. Do not take these medicines before your procedure if your health care provider instructs you not to.  You may be asked to take certain medicines.  You may be asked to stop smoking.  You may have an exam or testing.  Plan to have someone take you home after the  procedure.  Plan to have someone help you with activities during recovery. What happens during the procedure?  To reduce your risk of infection: ? Your health care team will wash or sanitize their hands. ? Your skin will be washed with soap.  You will be given a medicine to numb the area (local anesthetic).  One of the following excision techniques will be performed.  At the end of any of these procedures, antibiotic ointment will be applied as needed. Each of the following  techniques may vary among health care providers and hospitals. Complete Surgical Excision The area of skin that needs to be removed will be marked with a pen. Using a small scalpel or scissors, the surgeon will gently cut around and under the lesion until it is completely removed. The lesion will be placed in a fluid and sent to the lab for examination. If necessary, bleeding will be controlled with a device that delivers heat (electrocautery). The edges of the wound may be stitched (sutured) together, and a bandage (dressing) will be applied. This procedure may be performed to treat a cancerous growth or a noncancerous cyst or lesion. Excision of a Cyst The surgeon will make an incision on the cyst. The entire cyst will be removed through the incision. The incision may be closed with sutures. Shave Excision During shave excision, the surgeon will use a small blade or an electrically heated loop instrument to shave off the lesion. This may be done to remove a mole or a skin tag. The wound will usually be left to heal on its own without sutures. Punch Excision During punch excision, the surgeon will use a small tool that is like a cookie cutter or a hole punch to cut a circle shape out of the skin. The outer edges of the skin will be sutured together. This may be done to remove a mole or a scar or to perform a biopsy of the lesion. Mohs Micrographic Surgery During Mohs micrographic surgery, layers of the lesion will be removed with a scalpel or a loop instrument and will be examined right away under a microscope. Layers will be removed until all of the abnormal or cancerous tissue has been removed. This procedure is minimally invasive, and it ensures the best cosmetic outcome. It involves the removal of as little normal tissue as possible. Mohs is usually done to treat skin cancer, such as basal cell carcinoma or squamous cell carcinoma, particularly on the face and ears. Depending on the size of the  surgical wound, it may be sutured closed. What happens after the procedure?  Return to your normal activities as told by your health care provider.  Talk with your health care provider to discuss any test results, treatment options, and if necessary, the need for more tests. This information is not intended to replace advice given to you by your health care provider. Make sure you discuss any questions you have with your health care provider. Document Released: 07/24/2009 Document Revised: 10/05/2015 Document Reviewed: 06/15/2014 Elsevier Interactive Patient Education  2018 [employer]. Excision of Skin Lesions, Care After Refer to this sheet in the next few weeks. These instructions provide you with information about caring for yourself after your procedure. Your health care provider may also give you more specific instructions. Your treatment has been planned according to current medical practices, but problems sometimes occur. Call your health care provider if you have any problems or questions after your procedure. What can I expect after the procedure? After  your procedure, it is common to have pain or discomfort at the excision site. Follow these instructions at home:  Take over-the-counter and prescription medicines only as told by your health care provider.  Follow instructions from your health care provider about: ? How to take care of your excision site. You should keep the site clean, dry, and protected for at least 48 hours. ? When and how you should change your bandage (dressing). ? When you should remove your dressing. ? Removing whatever was used to close your excision site.  Check the excision area every day for signs of infection. Watch for: ? Redness, swelling, or pain. ? Fluid, blood, or pus.  For bleeding, apply gentle but firm pressure to the area using a folded towel for 20 minutes.  Avoid high-impact exercise and activities until the stitches (sutures) are  removed or the area heals.  Follow instructions from your health care provider about how to minimize scarring. Avoid sun exposure until the area has healed. Scarring should lessen over time.  Keep all follow-up visits as told by your health care provider. This is important. Contact a health care provider if:  You have a fever.  You have redness, swelling, or pain at the excision site.  You have fluid, blood, or pus coming from the excision site.  You have ongoing bleeding at the excision site.  You have pain that does not improve in 2-3 days after your procedure.  You notice skin irregularities or changes in sensation. This information is not intended to replace advice given to you by your health care provider. Make sure you discuss any questions you have with your health care provider. Document Released: 09/13/2014 Document Revised: 10/05/2015 Document Reviewed: 06/15/2014 Elsevier Interactive Patient Education  2018 [place] Anesthesia, Adult General anesthesia is the use of medicines to make a person "go to sleep" (be unconscious) for a medical procedure. General anesthesia is often recommended when a procedure:  Is long.  Requires you to be still or in an unusual position.  Is major and can cause you to lose blood.  Is impossible to do without general anesthesia.  The medicines used for general anesthesia are called general anesthetics. In addition to making you sleep, the medicines:  Prevent pain.  Control your blood pressure.  Relax your muscles.  Tell a health care provider about:  Any allergies you have.  All medicines you are taking, including vitamins, herbs, eye drops, creams, and over-the-counter medicines.  Any problems you or family members have had with anesthetic medicines.  Types of anesthetics you have had in the past.  Any bleeding disorders you have.  Any surgeries you have had.  Any medical conditions you have.  Any history  of heart or lung conditions, such as heart failure, sleep apnea, or chronic obstructive pulmonary disease (COPD).  Whether you are pregnant or may be pregnant.  Whether you use tobacco, alcohol, marijuana, or street drugs.  Any history of Armed forces logistics/support/administrative officer.  Any history of depression or anxiety. What are the risks? Generally, this is a safe procedure. However, problems may occur, including:  Allergic reaction to anesthetics.  Lung and heart problems.  Inhaling food or liquids from your stomach into your lungs (aspiration).  Injury to nerves.  Waking up during your procedure and being unable to move (rare).  Extreme agitation or a state of mental confusion (delirium) when you wake up from the anesthetic.  Air in the bloodstream, which can lead to stroke.  These  problems are more likely to develop if you are having a major surgery or if you have an advanced medical condition. You can prevent some of these complications by answering all of your health care provider's questions thoroughly and by following all pre-procedure instructions. General anesthesia can cause side effects, including:  Nausea or vomiting  A sore throat from the breathing tube.  Feeling cold or shivery.  Feeling tired, washed out, or achy.  Sleepiness or drowsiness.  Confusion or agitation.  What happens before the procedure? Staying hydrated Follow instructions from your health care provider about hydration, which may include:  Up to 2 hours before the procedure - you may continue to drink clear liquids, such as water, clear fruit juice, black coffee, and plain tea.  Eating and drinking restrictions Follow instructions from your health care provider about eating and drinking, which may include:  8 hours before the procedure - stop eating heavy meals or foods such as meat, fried foods, or fatty foods.  6 hours before the procedure - stop eating light meals or foods, such as toast or cereal.  6  hours before the procedure - stop drinking milk or drinks that contain milk.  2 hours before the procedure - stop drinking clear liquids.  Medicines  Ask your health care provider about: ? Changing or stopping your regular medicines. This is especially important if you are taking diabetes medicines or blood thinners. ? Taking medicines such as aspirin and ibuprofen. These medicines can thin your blood. Do not take these medicines before your procedure if your health care provider instructs you not to. ? Taking new dietary supplements or medicines. Do not take these during the week before your procedure unless your health care provider approves them.  If you are told to take a medicine or to continue taking a medicine on the day of the procedure, take the medicine with sips of water. General instructions   Ask if you will be going home the same day, the following day, or after a longer hospital stay. ? Plan to have someone take you home. ? Plan to have someone stay with you for the first 24 hours after you leave the hospital or clinic.  For 3-6 weeks before the procedure, try not to use any tobacco products, such as cigarettes, chewing tobacco, and e-cigarettes.  You may brush your teeth on the morning of the procedure, but make sure to spit out the toothpaste. What happens during the procedure?  You will be given anesthetics through a mask and through an IV tube in one of your veins.  You may receive medicine to help you relax (sedative).  As soon as you are asleep, a breathing tube may be used to help you breathe.  An anesthesia specialist will stay with you throughout the procedure. He or she will help keep you comfortable and safe by continuing to give you medicines and adjusting the amount of medicine that you get. He or she will also watch your blood pressure, pulse, and oxygen levels to make sure that the anesthetics do not cause any problems.  If a breathing tube was used to  help you breathe, it will be removed before you wake up. The procedure may vary among health care providers and hospitals. What happens after the procedure?  You will wake up, often slowly, after the procedure is complete, usually in a recovery area.  Your blood pressure, heart rate, breathing rate, and blood oxygen level will be monitored until the medicines  you were given have worn off.  You may be given medicine to help you calm down if you feel anxious or agitated.  If you will be going home the same day, your health care provider may check to make sure you can stand, drink, and urinate.  Your health care providers will treat your pain and side effects before you go home.  Do not drive for 24 hours if you received a sedative.  You may: ? Feel nauseous and vomit. ? Have a sore throat. ? Have mental slowness. ? Feel cold or shivery. ? Feel sleepy. ? Feel tired. ? Feel sore or achy, even in parts of your body where you did not have surgery. This information is not intended to replace advice given to you by your health care provider. Make sure you discuss any questions you have with your health care provider. Document Released: 08/06/2007 Document Revised: 10/10/2015 Document Reviewed: 04/13/2015 Elsevier Interactive Patient Education  2018 [place] Anesthesia, Adult, Care After These instructions provide you with information about caring for yourself after your procedure. Your health care provider may also give you more specific instructions. Your treatment has been planned according to current medical practices, but problems sometimes occur. Call your health care provider if you have any problems or questions after your procedure. What can I expect after the procedure? After the procedure, it is common to have:  Vomiting.  A sore throat.  Mental slowness.  It is common to feel:  Nauseous.  Cold or shivery.  Sleepy.  Tired.  Sore or achy, even in parts  of your body where you did not have surgery.  Follow these instructions at home: For at least 24 hours after the procedure:  Do not: ? Participate in activities where you could fall or become injured. ? Drive. ? Use heavy machinery. ? Drink alcohol. ? Take sleeping pills or medicines that cause drowsiness. ? Make important decisions or sign legal documents. ? Take care of children on your own.  Rest. Eating and drinking  If you vomit, drink water, juice, or soup when you can drink without vomiting.  Drink enough fluid to keep your urine clear or pale yellow.  Make sure you have little or no nausea before eating solid foods.  Follow the diet recommended by your health care provider. General instructions  Have a responsible adult stay with you until you are awake and alert.  Return to your normal activities as told by your health care provider. Ask your health care provider what activities are safe for you.  Take over-the-counter and prescription medicines only as told by your health care provider.  If you smoke, do not smoke without supervision.  Keep all follow-up visits as told by your health care provider. This is important. Contact a health care provider if:  You continue to have nausea or vomiting at home, and medicines are not helpful.  You cannot drink fluids or start eating again.  You cannot urinate after 8-12 hours.  You develop a skin rash.  You have fever.  You have increasing redness at the site of your procedure. Get help right away if:  You have difficulty breathing.  You have chest pain.  You have unexpected bleeding.  You feel that you are having a life-threatening or urgent problem. This information is not intended to replace advice given to you by your health care provider. Make sure you discuss any questions you have with your health care provider. Document Released: 08/05/2000 Document  Revised: 10/02/2015 Document Reviewed:  04/13/2015 Elsevier Interactive Patient Education  [employer].

## 2017-06-17 ENCOUNTER — Inpatient Hospital Stay (HOSPITAL_COMMUNITY): Admission: RE | Admit: 2017-06-17 | Payer: Medicare Other | Source: Ambulatory Visit

## 2017-06-19 ENCOUNTER — Encounter (HOSPITAL_COMMUNITY)
Admission: RE | Admit: 2017-06-19 | Discharge: 2017-06-19 | Disposition: A | Discharge status: Home / Self Care | Payer: Medicare Other | Source: Ambulatory Visit | Attending: General Surgery | Admitting: General Surgery

## 2017-06-19 ENCOUNTER — Encounter (HOSPITAL_COMMUNITY): Payer: Self-pay

## 2017-06-19 ENCOUNTER — Other Ambulatory Visit: Payer: Self-pay

## 2017-06-19 DIAGNOSIS — Z87891 Personal history of nicotine dependence: Secondary | ICD-10-CM | POA: Diagnosis not present

## 2017-06-19 DIAGNOSIS — M1990 Unspecified osteoarthritis, unspecified site: Secondary | ICD-10-CM | POA: Insufficient documentation

## 2017-06-19 DIAGNOSIS — E785 Hyperlipidemia, unspecified: Secondary | ICD-10-CM | POA: Insufficient documentation

## 2017-06-19 DIAGNOSIS — A630 Anogenital (venereal) warts: Secondary | ICD-10-CM | POA: Insufficient documentation

## 2017-06-19 DIAGNOSIS — Z01812 Encounter for preprocedural laboratory examination: Secondary | ICD-10-CM | POA: Diagnosis not present

## 2017-06-19 DIAGNOSIS — Z888 Allergy status to other drugs, medicaments and biological substances status: Secondary | ICD-10-CM | POA: Insufficient documentation

## 2017-06-19 DIAGNOSIS — N189 Chronic kidney disease, unspecified: Secondary | ICD-10-CM | POA: Diagnosis not present

## 2017-06-19 DIAGNOSIS — Z8051 Family history of malignant neoplasm of kidney: Secondary | ICD-10-CM | POA: Insufficient documentation

## 2017-06-19 DIAGNOSIS — Z79899 Other long term (current) drug therapy: Secondary | ICD-10-CM | POA: Insufficient documentation

## 2017-06-19 DIAGNOSIS — Z800 Family history of malignant neoplasm of digestive organs: Secondary | ICD-10-CM | POA: Diagnosis not present

## 2017-06-19 DIAGNOSIS — Z8249 Family history of ischemic heart disease and other diseases of the circulatory system: Secondary | ICD-10-CM | POA: Diagnosis not present

## 2017-06-19 DIAGNOSIS — K219 Gastro-esophageal reflux disease without esophagitis: Secondary | ICD-10-CM | POA: Insufficient documentation

## 2017-06-19 DIAGNOSIS — Z9889 Other specified postprocedural states: Secondary | ICD-10-CM | POA: Diagnosis not present

## 2017-06-19 HISTORY — DX: Unspecified convulsions: R56.9

## 2017-06-19 LAB — BASIC METABOLIC PANEL
Anion gap: 10 (ref 5–15)
BUN: 27 mg/dL — AB (ref 6–20)
CO2: 23 mmol/L (ref 22–32)
Calcium: 9.4 mg/dL (ref 8.9–10.3)
Chloride: 102 mmol/L (ref 101–111)
Creatinine, Ser: 1.33 mg/dL — ABNORMAL HIGH (ref 0.44–1.00)
GFR calc Af Amer: 46 mL/min — ABNORMAL LOW (ref >60)
GFR calc non Af Amer: 40 mL/min — ABNORMAL LOW (ref >60)
GLUCOSE: 123 mg/dL — AB (ref 65–99)
POTASSIUM: 4.7 mmol/L (ref 3.5–5.1)
Sodium: 135 mmol/L (ref 135–145)

## 2017-06-19 LAB — CBC WITH DIFFERENTIAL/PLATELET
BASOS ABS: 0 10*3/uL (ref 0.0–0.1)
Basophils Relative: 0 %
EOS PCT: 4 %
Eosinophils Absolute: 0.2 10*3/uL (ref 0.0–0.7)
HEMATOCRIT: 37 % (ref 36.0–46.0)
HEMOGLOBIN: 11.8 g/dL — AB (ref 12.0–15.0)
LYMPHS PCT: 23 %
Lymphs Abs: 1.3 10*3/uL (ref 0.7–4.0)
MCH: 29.2 pg (ref 26.0–34.0)
MCHC: 31.9 g/dL (ref 30.0–36.0)
MCV: 91.6 fL (ref 78.0–100.0)
Monocytes Absolute: 0.5 10*3/uL (ref 0.1–1.0)
Monocytes Relative: 9 %
Neutro Abs: 3.6 10*3/uL (ref 1.7–7.7)
Neutrophils Relative %: 64 %
Platelets: 234 10*3/uL (ref 150–400)
RBC: 4.04 MIL/uL (ref 3.87–5.11)
RDW: 13.1 % (ref 11.5–15.5)
WBC: 5.6 10*3/uL (ref 4.0–10.5)

## 2017-06-23 ENCOUNTER — Ambulatory Visit (HOSPITAL_COMMUNITY)
Admission: RE | Admit: 2017-06-23 | Discharge: 2017-06-23 | Disposition: A | Discharge status: Home / Self Care | Payer: Medicare Other | Source: Ambulatory Visit | Attending: General Surgery | Admitting: General Surgery

## 2017-06-23 ENCOUNTER — Ambulatory Visit (HOSPITAL_COMMUNITY): Payer: Medicare Other | Admitting: Anesthesiology

## 2017-06-23 ENCOUNTER — Encounter (HOSPITAL_COMMUNITY): Payer: Self-pay

## 2017-06-23 ENCOUNTER — Encounter (HOSPITAL_COMMUNITY)
Admission: RE | Disposition: A | Discharge status: Home / Self Care | Payer: Self-pay | Source: Ambulatory Visit | Attending: General Surgery

## 2017-06-23 DIAGNOSIS — K219 Gastro-esophageal reflux disease without esophagitis: Secondary | ICD-10-CM | POA: Diagnosis not present

## 2017-06-23 DIAGNOSIS — I341 Nonrheumatic mitral (valve) prolapse: Secondary | ICD-10-CM | POA: Diagnosis not present

## 2017-06-23 DIAGNOSIS — M1990 Unspecified osteoarthritis, unspecified site: Secondary | ICD-10-CM | POA: Diagnosis not present

## 2017-06-23 DIAGNOSIS — Z888 Allergy status to other drugs, medicaments and biological substances status: Secondary | ICD-10-CM | POA: Diagnosis not present

## 2017-06-23 DIAGNOSIS — Z8249 Family history of ischemic heart disease and other diseases of the circulatory system: Secondary | ICD-10-CM | POA: Insufficient documentation

## 2017-06-23 DIAGNOSIS — A630 Anogenital (venereal) warts: Secondary | ICD-10-CM

## 2017-06-23 DIAGNOSIS — Z981 Arthrodesis status: Secondary | ICD-10-CM | POA: Insufficient documentation

## 2017-06-23 DIAGNOSIS — K629 Disease of anus and rectum, unspecified: Secondary | ICD-10-CM | POA: Diagnosis present

## 2017-06-23 DIAGNOSIS — Z87891 Personal history of nicotine dependence: Secondary | ICD-10-CM | POA: Insufficient documentation

## 2017-06-23 DIAGNOSIS — Z800 Family history of malignant neoplasm of digestive organs: Secondary | ICD-10-CM | POA: Diagnosis not present

## 2017-06-23 DIAGNOSIS — Z79899 Other long term (current) drug therapy: Secondary | ICD-10-CM | POA: Insufficient documentation

## 2017-06-23 DIAGNOSIS — Z883 Allergy status to other anti-infective agents status: Secondary | ICD-10-CM | POA: Insufficient documentation

## 2017-06-23 DIAGNOSIS — E785 Hyperlipidemia, unspecified: Secondary | ICD-10-CM | POA: Diagnosis not present

## 2017-06-23 DIAGNOSIS — K6282 Dysplasia of anus: Secondary | ICD-10-CM | POA: Insufficient documentation

## 2017-06-23 HISTORY — PX: HEMORRHOID SURGERY: SHX153

## 2017-06-23 SURGERY — HEMORRHOIDECTOMY
Anesthesia: General

## 2017-06-23 MED ORDER — CHLORHEXIDINE GLUCONATE CLOTH 2 % EX PADS: 6.0000 | MEDICATED_PAD | Freq: Once | CUTANEOUS | Status: DC

## 2017-06-23 MED ORDER — BUPIVACAINE LIPOSOME 1.3 % IJ SUSP
INTRAMUSCULAR | Status: DC | PRN
  Administered 2017-06-23: 20 mL

## 2017-06-23 MED ORDER — DEXAMETHASONE SODIUM PHOSPHATE 4 MG/ML IJ SOLN
4.0000 mg | Freq: Once | INTRAMUSCULAR | Status: AC
  Administered 2017-06-23: 4 mg via INTRAVENOUS

## 2017-06-23 MED ORDER — FENTANYL CITRATE (PF) 250 MCG/5ML IJ SOLN
INTRAMUSCULAR | Status: AC
  Filled 2017-06-23: qty 5

## 2017-06-23 MED ORDER — DEXAMETHASONE SODIUM PHOSPHATE 4 MG/ML IJ SOLN
INTRAMUSCULAR | Status: AC
  Filled 2017-06-23: qty 1

## 2017-06-23 MED ORDER — LIDOCAINE HCL (PF) 1 % IJ SOLN
INTRAMUSCULAR | Status: AC
  Filled 2017-06-23: qty 5

## 2017-06-23 MED ORDER — SUGAMMADEX SODIUM 500 MG/5ML IV SOLN
INTRAVENOUS | Status: AC
  Filled 2017-06-23: qty 5

## 2017-06-23 MED ORDER — LIDOCAINE HCL (CARDIAC) 20 MG/ML IV SOLN
INTRAVENOUS | Status: DC | PRN
  Administered 2017-06-23: 30 mg via INTRAVENOUS

## 2017-06-23 MED ORDER — HYDROCODONE-ACETAMINOPHEN 5-325 MG PO TABS: 1.0000 | ORAL_TABLET | Freq: Four times a day (QID) | ORAL | 0 refills | Status: DC | PRN

## 2017-06-23 MED ORDER — PROPOFOL 10 MG/ML IV BOLUS
INTRAVENOUS | Status: DC | PRN
  Administered 2017-06-23: 150 mg via INTRAVENOUS

## 2017-06-23 MED ORDER — FENTANYL CITRATE (PF) 100 MCG/2ML IJ SOLN: 25.0000 ug | INTRAMUSCULAR | Status: DC | PRN

## 2017-06-23 MED ORDER — SODIUM CHLORIDE 0.9 % IR SOLN
Status: DC | PRN
  Administered 2017-06-23: 1000 mL

## 2017-06-23 MED ORDER — BUPIVACAINE LIPOSOME 1.3 % IJ SUSP
INTRAMUSCULAR | Status: AC
  Filled 2017-06-23: qty 20

## 2017-06-23 MED ORDER — KETOROLAC TROMETHAMINE 30 MG/ML IJ SOLN
30.0000 mg | Freq: Once | INTRAMUSCULAR | Status: AC
  Administered 2017-06-23: 30 mg via INTRAVENOUS

## 2017-06-23 MED ORDER — MIDAZOLAM HCL 2 MG/2ML IJ SOLN
1.0000 mg | INTRAMUSCULAR | Status: AC
  Administered 2017-06-23: 2 mg via INTRAVENOUS

## 2017-06-23 MED ORDER — SODIUM CHLORIDE 0.9 % IV SOLN
1.0000 g | INTRAVENOUS | Status: AC
  Administered 2017-06-23: 1 g via INTRAVENOUS

## 2017-06-23 MED ORDER — LIDOCAINE VISCOUS 2 % MT SOLN
OROMUCOSAL | Status: AC
  Filled 2017-06-23: qty 15

## 2017-06-23 MED ORDER — DEXAMETHASONE SODIUM PHOSPHATE 4 MG/ML IJ SOLN
INTRAMUSCULAR | Status: DC | PRN
  Administered 2017-06-23: 4 mg via INTRAVENOUS

## 2017-06-23 MED ORDER — MIDAZOLAM HCL 2 MG/2ML IJ SOLN
INTRAMUSCULAR | Status: AC
  Filled 2017-06-23: qty 2

## 2017-06-23 MED ORDER — LIDOCAINE VISCOUS 2 % MT SOLN
OROMUCOSAL | Status: DC | PRN
  Administered 2017-06-23: 1 via OROMUCOSAL

## 2017-06-23 MED ORDER — FENTANYL CITRATE (PF) 100 MCG/2ML IJ SOLN
INTRAMUSCULAR | Status: DC | PRN
  Administered 2017-06-23 (×2): 50 ug via INTRAVENOUS

## 2017-06-23 MED ORDER — ONDANSETRON HCL 4 MG/2ML IJ SOLN
INTRAMUSCULAR | Status: AC
  Filled 2017-06-23: qty 2

## 2017-06-23 MED ORDER — ARTIFICIAL TEARS OPHTHALMIC OINT
TOPICAL_OINTMENT | OPHTHALMIC | Status: AC
  Filled 2017-06-23: qty 3.5

## 2017-06-23 MED ORDER — ONDANSETRON HCL 4 MG/2ML IJ SOLN
4.0000 mg | Freq: Once | INTRAMUSCULAR | Status: AC
  Administered 2017-06-23: 4 mg via INTRAVENOUS

## 2017-06-23 MED ORDER — SUCCINYLCHOLINE CHLORIDE 20 MG/ML IJ SOLN
INTRAMUSCULAR | Status: AC
  Filled 2017-06-23: qty 2

## 2017-06-23 MED ORDER — LACTATED RINGERS IV SOLN
INTRAVENOUS | Status: DC
  Administered 2017-06-23: 09:00:00 via INTRAVENOUS

## 2017-06-23 MED ORDER — SUCCINYLCHOLINE CHLORIDE 20 MG/ML IJ SOLN
INTRAMUSCULAR | Status: DC | PRN
  Administered 2017-06-23: 40 mg via INTRAVENOUS
  Administered 2017-06-23: 120 mg via INTRAVENOUS

## 2017-06-23 MED ORDER — ROCURONIUM BROMIDE 50 MG/5ML IV SOLN
INTRAVENOUS | Status: AC
  Filled 2017-06-23: qty 1

## 2017-06-23 MED ORDER — KETOROLAC TROMETHAMINE 30 MG/ML IJ SOLN
INTRAMUSCULAR | Status: AC
  Filled 2017-06-23: qty 1

## 2017-06-23 SURGICAL SUPPLY — 35 items
BAG HAMPER (MISCELLANEOUS) ×3 IMPLANT
CLOTH BEACON ORANGE TIMEOUT ST (SAFETY) ×3 IMPLANT
COVER LIGHT HANDLE STERIS (MISCELLANEOUS) ×6 IMPLANT
DECANTER SPIKE VIAL GLASS SM (MISCELLANEOUS) ×3 IMPLANT
DRAPE HALF SHEET 40X57 (DRAPES) ×3 IMPLANT
DRAPE PROXIMA HALF (DRAPES) ×3 IMPLANT
ELECT REM PT RETURN 9FT ADLT (ELECTROSURGICAL) ×3
ELECTRODE REM PT RTRN 9FT ADLT (ELECTROSURGICAL) ×1 IMPLANT
FORMALIN 10 PREFIL 120ML (MISCELLANEOUS) ×3 IMPLANT
GAUZE SPONGE 4X4 12PLY STRL (GAUZE/BANDAGES/DRESSINGS) ×4 IMPLANT
GLOVE BIOGEL PI IND STRL 6.5 (GLOVE) IMPLANT
GLOVE BIOGEL PI IND STRL 7.0 (GLOVE) ×1 IMPLANT
GLOVE BIOGEL PI IND STRL 7.5 (GLOVE) IMPLANT
GLOVE BIOGEL PI INDICATOR 6.5 (GLOVE) ×2
GLOVE BIOGEL PI INDICATOR 7.0 (GLOVE) ×2
GLOVE BIOGEL PI INDICATOR 7.5 (GLOVE) ×2
GLOVE ECLIPSE 6.5 STRL STRAW (GLOVE) ×2 IMPLANT
GLOVE SURG SS PI 7.5 STRL IVOR (GLOVE) ×6 IMPLANT
GOWN STRL REUS W/ TWL XL LVL3 (GOWN DISPOSABLE) ×1 IMPLANT
GOWN STRL REUS W/TWL LRG LVL3 (GOWN DISPOSABLE) ×3 IMPLANT
GOWN STRL REUS W/TWL XL LVL3 (GOWN DISPOSABLE) ×3
HEMOSTAT SURGICEL 4X8 (HEMOSTASIS) ×3 IMPLANT
KIT ROOM TURNOVER AP CYSTO (KITS) ×3 IMPLANT
LIGASURE IMPACT 36 18CM CVD LR (INSTRUMENTS) ×3 IMPLANT
MANIFOLD NEPTUNE II (INSTRUMENTS) ×3 IMPLANT
NEEDLE HYPO 22GX1.5 SAFETY (NEEDLE) ×3 IMPLANT
NS IRRIG 1000ML POUR BTL (IV SOLUTION) ×3 IMPLANT
PACK PERI GYN (CUSTOM PROCEDURE TRAY) ×3 IMPLANT
PAD ARMBOARD 7.5X6 YLW CONV (MISCELLANEOUS) ×3 IMPLANT
SET BASIN LINEN APH (SET/KITS/TRAYS/PACK) ×3 IMPLANT
STRIP SURGICAL 2 X 6 IN (GAUZE/BANDAGES/DRESSINGS) ×2 IMPLANT
SURGILUBE 3G PEEL PACK STRL (MISCELLANEOUS) ×3 IMPLANT
SUT SILK 0 FSL (SUTURE) ×3 IMPLANT
SUT VIC AB 2-0 CT2 27 (SUTURE) IMPLANT
SYR 20CC LL (SYRINGE) ×3 IMPLANT

## 2017-06-23 NOTE — Discharge Instructions (Signed)

## 2017-06-23 NOTE — Anesthesia Postprocedure Evaluation (Signed)
Anesthesia Post Note  Patient: Mary [name]  Procedure(s) Performed: ANOSCOPY WITH REMOVAL OF SINGLE LESION (N/A )  Patient location during evaluation: PACU Anesthesia Type: General Level of consciousness: awake, oriented and patient cooperative Pain management: pain level controlled Vital Signs Assessment: post-procedure vital signs reviewed and stable Respiratory status: spontaneous breathing and respiratory function stable Cardiovascular status: stable Postop Assessment: no apparent nausea or vomiting Anesthetic complications: no     Last Vitals:  Vitals:   06/23/17 0950 06/23/17 1000  BP: 107/64 (!) 105/53  Pulse: 61 (!) 57  Resp: 14 19  Temp: 36.6 C   SpO2: 100% 100%    Last Pain:  Vitals:   06/23/17 1000  TempSrc:   PainSc: Asleep                 ADAMS, AMY A

## 2017-06-23 NOTE — Op Note (Signed)
Patient:  Mary [name]  DOB:  June 27, 1950  MRN:  [id number]   Preop Diagnosis: Anal verge lesion, possible condyloma  Postop Diagnosis: Same  Procedure: Anoscopy with excision of the anal lesion  Surgeon: [name], MD  Anes: General  Indications: Patient is a 67 year old white female who underwent a colonoscopy and was found to have anal lesion which was biopsied.  The patient is now referred for formal excision of the lesion.  The risks and benefits of the procedure including bleeding, infection, and recurrence of the lesion were fully explained to the patient, who gave informed consent.  Procedure note: The patient was placed in the lithotomy position after induction of general endotracheal anesthesia.  The perineum was prepped and draped using the usual sterile technique with Betadine.  Surgical site confirmation was performed.  On anoscopy, there was a lesion that appeared scarred and granulomatous at the 5 o'clock position, consistent with a previous biopsy.  No other mass lesions were noted.  This was at the anal verge.  This was excised full-thickness using the harmonic scalpel without difficulty.  It was sent to pathology for further examination.  No abnormal bleeding was noted at the end of the procedure.  Exparel was instilled into the surrounding region.  Surgicel and Viscous Xylocaine rectal packing was placed.  All tape and needle counts were correct at the end of the procedure.  Patient was awakened and transferred to PACU in stable condition.  Complications: None  EBL: Minimal  Specimen: Anal lesion

## 2017-06-23 NOTE — Interval H&P Note (Signed)
History and Physical Interval Note:  06/23/2017 8:33 AM  Mary [name]  has presented today for surgery, with the diagnosis of anal condylloma  The various methods of treatment have been discussed with the patient and family. After consideration of risks, benefits and other options for treatment, the patient has consented to  Procedure(s): ANOSCOPY WITH REMOVAL OF SINGLE LESION (N/A) as a surgical intervention .  The patient's history has been reviewed, patient examined, no change in status, stable for surgery.  I have reviewed the patient's chart and labs.  Questions were answered to the patient's satisfaction.     [name]

## 2017-06-23 NOTE — Anesthesia Procedure Notes (Signed)
Procedure Name: Intubation Date/Time: 06/23/2017 9:07 AM Performed by: Andree Elk, Lamaj Metoyer A, CRNA Pre-anesthesia Checklist: Timeout performed, Patient identified, Emergency Drugs available, Suction available and Patient being monitored Patient Re-evaluated:Patient Re-evaluated prior to induction Oxygen Delivery Method: Circle system utilized Preoxygenation: Pre-oxygenation with 100% oxygen Induction Type: IV induction and Cricoid Pressure applied Ventilation: Mask ventilation without difficulty Laryngoscope Size: Glidescope and 3 Number of attempts: 3 Airway Equipment and Method: Video-laryngoscopy Placement Confirmation: ETT inserted through vocal cords under direct vision,  breath sounds checked- equal and bilateral and positive ETCO2 Secured at: 20 cm Tube secured with: Tape Difficulty Due To: Difficulty was unanticipated and Difficult Airway- due to anterior larynx Comments: DL with Mac 3, Miller 3 and unable to visualize cords; patient intubated using glidescope; cricoid pressure required for full visualization; VSS throughout; easy mask airway

## 2017-06-23 NOTE — Anesthesia Preprocedure Evaluation (Signed)
Anesthesia Evaluation  Patient identified by MRN, date of birth, ID band Patient awake    Reviewed: Allergy & Precautions, NPO status , Patient's Chart, lab work & pertinent test results, reviewed documented beta blocker date and time   History of Anesthesia Complications (+) PONV and history of anesthetic complications  Airway Mallampati: II  TM Distance: >3 FB Neck ROM: Full    Dental no notable dental hx. (+) Teeth Intact   Pulmonary neg pulmonary ROS, former smoker,    Pulmonary exam normal breath sounds clear to auscultation       Cardiovascular (-) hypertensionNormal cardiovascular exam Rhythm:Regular Rate:Normal     Neuro/Psych  Headaches, Seizures -, Well Controlled,  negative psych ROS   GI/Hepatic Neg liver ROS, GERD  Poorly Controlled,  Endo/Other  negative endocrine ROS  Renal/GU Renal disease  negative genitourinary   Musculoskeletal  (+) Arthritis ,   Abdominal   Peds negative pediatric ROS (+)  Hematology  (+) anemia ,   Anesthesia Other Findings   Reproductive/Obstetrics negative OB ROS                             Anesthesia Physical Anesthesia Plan  ASA: III  Anesthesia Plan: General   Post-op Pain Management:    Induction: Intravenous, Rapid sequence and Cricoid pressure planned  PONV Risk Score and Plan:   Airway Management Planned: Oral ETT  Additional Equipment:   Intra-op Plan:   Post-operative Plan: Extubation in OR  Informed Consent: I have reviewed the patients History and Physical, chart, labs and discussed the procedure including the risks, benefits and alternatives for the proposed anesthesia with the patient or authorized representative who has indicated his/her understanding and acceptance.     Plan Discussed with:   Anesthesia Plan Comments:         Anesthesia Quick Evaluation

## 2017-06-23 NOTE — Transfer of Care (Signed)
Immediate Anesthesia Transfer of Care Note  Patient: Mary [name]  Procedure(s) Performed: ANOSCOPY WITH REMOVAL OF SINGLE LESION (N/A )  Patient Location: PACU  Anesthesia Type:General  Level of Consciousness: drowsy and patient cooperative  Airway & Oxygen Therapy: Patient Spontanous Breathing and Patient connected to face mask oxygen  Post-op Assessment: Report given to RN and Post -op Vital signs reviewed and stable  Post vital signs: Reviewed and stable  Last Vitals:  Vitals:   06/23/17 0830 06/23/17 0845  BP: 119/64 112/64  Pulse:    Resp: (!) 26 (!) 25  Temp:    SpO2:      Last Pain:  Vitals:   06/23/17 0740  TempSrc: Oral  PainSc: 0-No pain         Complications: No apparent anesthesia complications

## 2017-06-24 ENCOUNTER — Encounter (HOSPITAL_COMMUNITY): Payer: Self-pay | Admitting: General Surgery

## 2017-07-01 ENCOUNTER — Ambulatory Visit (INDEPENDENT_AMBULATORY_CARE_PROVIDER_SITE_OTHER): Payer: Self-pay | Admitting: General Surgery

## 2017-07-01 ENCOUNTER — Encounter: Payer: Self-pay | Admitting: General Surgery

## 2017-07-01 VITALS — BP 102/54 | HR 84 | Temp 97.8°F | Ht 65.0 in | Wt 154.0 lb

## 2017-07-01 DIAGNOSIS — Z09 Encounter for follow-up examination after completed treatment for conditions other than malignant neoplasm: Secondary | ICD-10-CM

## 2017-07-01 NOTE — Progress Notes (Signed)
Subjective:     Mary [name]  Status post excision of anal lesion.  Patient states she is doing well.  She has no complaints. Objective:    BP (!) 102/54   Pulse 84   Temp 97.8 F (36.6 C)   Ht 5\' 5"  (1.651 m)   Wt 154 lb (69.9 kg)   BMI 25.63 kg/m   General:  alert, cooperative and no distress  Rectum healing well. 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  Margins were not assessed.     Assessment:    Doing well postoperatively.    Plan:   Pathology is going to review the specimen to check for margins.  I did give the results of the pathology to the patient.  This warrants close follow-up.  Would like to see the patient back in 6 months for rectal examination.

## 2017-07-02 LAB — COMPREHENSIVE METABOLIC PANEL
ALBUMIN: 4.3 g/dL (ref 3.6–4.8)
ALK PHOS: 63 IU/L (ref 39–117)
ALT: 16 IU/L (ref 0–32)
AST: 23 IU/L (ref 0–40)
Albumin/Globulin Ratio: 1.7 (ref 1.2–2.2)
BUN/Creatinine Ratio: 19 (ref 12–28)
BUN: 22 mg/dL (ref 8–27)
Bilirubin Total: 0.4 mg/dL (ref 0.0–1.2)
CO2: 20 mmol/L (ref 20–29)
CREATININE: 1.13 mg/dL — AB (ref 0.57–1.00)
Calcium: 9.7 mg/dL (ref 8.7–10.3)
Chloride: 107 mmol/L — ABNORMAL HIGH (ref 96–106)
GFR calc Af Amer: 58 mL/min/{1.73_m2} — ABNORMAL LOW (ref >59)
GFR calc non Af Amer: 50 mL/min/{1.73_m2} — ABNORMAL LOW (ref >59)
GLUCOSE: 93 mg/dL (ref 65–99)
Globulin, Total: 2.6 g/dL (ref 1.5–4.5)
Potassium: 4.8 mmol/L (ref 3.5–5.2)
Sodium: 141 mmol/L (ref 134–144)
Total Protein: 6.9 g/dL (ref 6.0–8.5)

## 2017-07-02 LAB — LIPID PANEL
CHOL/HDL RATIO: 2.3 ratio (ref 0.0–4.4)
Cholesterol, Total: 136 mg/dL (ref 100–199)
HDL: 59 mg/dL (ref >39)
LDL CALC: 67 mg/dL (ref 0–99)
Triglycerides: 49 mg/dL (ref 0–149)
VLDL Cholesterol Cal: 10 mg/dL (ref 5–40)

## 2017-07-02 LAB — HEMOGLOBIN A1C
ESTIMATED AVERAGE GLUCOSE: 120 mg/dL
HEMOGLOBIN A1C: 5.8 % — AB (ref 4.8–5.6)

## 2017-07-08 ENCOUNTER — Encounter: Payer: Self-pay | Admitting: Cardiovascular Disease

## 2017-07-08 ENCOUNTER — Ambulatory Visit: Payer: Medicare Other | Admitting: Cardiovascular Disease

## 2017-07-08 VITALS — BP 108/58 | HR 62 | Ht 65.0 in | Wt 153.4 lb

## 2017-07-08 DIAGNOSIS — Z8669 Personal history of other diseases of the nervous system and sense organs: Secondary | ICD-10-CM | POA: Diagnosis not present

## 2017-07-08 DIAGNOSIS — I10 Essential (primary) hypertension: Secondary | ICD-10-CM

## 2017-07-08 DIAGNOSIS — E785 Hyperlipidemia, unspecified: Secondary | ICD-10-CM

## 2017-07-08 DIAGNOSIS — E8881 Metabolic syndrome: Secondary | ICD-10-CM

## 2017-07-08 DIAGNOSIS — N183 Chronic kidney disease, stage 3 unspecified: Secondary | ICD-10-CM

## 2017-07-08 NOTE — Progress Notes (Signed)
Patient ID: Mary [name], female   DOB: 12-20-1950, 67 y.o.   MRN: [id number]     HPI: Mary [name] is a 67 y.o. female presents to the office for a 4 month  cardiology evaluation.  Mary [name] has a long-standing history of migraine headaches,  hypertension, hyperlipidemia, GERD, and has strong family history for both coronary as well as peripheral vascular disease. She also has PVD with lower extremity venous abnormalities. Remotely, she had been on pravastatin for hyperlipidemia but this was switched to Crestor do to inadequate response to pravastatin.   When I saw her over one year ago, she was stable on amlodipine 10 mg, benazepril 10 mg for hypetension as well as her migraine headaches and Crestor 40 mg for hyperlipidemia.   She had developed a rash and stopped taking Toprol-XL 12.5 mg.  She denied any palpitations.  Following its discontinuance or awareness of blood pressure elevation.  Over the past year, she has been bothered by chronic bronchitis and allergic cough.  She also underwent left knee replacement in October 2016 after previously undergoing right knee replacement in April 2015.  She has seen Dr. Gerarda Fraction in July and apparently complete set of blood work was done and she was told that these were within normal limits.  She denies any chest pain.  She is unaware of recurrent palpitations.  She denies presyncope or syncope.  She completed 3 courses of antibiotics.  She will be having a follow-up ENT evaluation later this week.  A chest x-ray on 02/12/2016 did not reveal any active cardiopulmonary disease.    She had developed a dry hacking cough and also was having swelling wall on amlodipine/benazepril combination 10/10.  Her neurologist who takes care of her migraines ultimately switched her to candesartan and she is now on 32 mg daily.  She believes her migraine frequency has increased with the change of medication.  Previously when she was on Lotrel.  She would experience 4  migraines per year and since February 2018 had experienced 15, although less intense than previously.  When I saw her, I added back amlodipine at 2.5 mg.  This has resulted in significant reduction in her migraines  When I last saw her, she had undergone blood work by Dr. Gerarda Fraction.  Her cholesterol was 174 with an LDL of 92 and triglycerides of 67.  When I saw her in October, I added Zetia 10 mg to her rosuvastatin 40 mg in attempt to reach an LDL less than 70.  Tolerated this well.  Subsequent blood work has shown benefit and with most recent LDL 1 week ago at 109.  He will A1c was minimally increased at 5.8.  These were normal.  Since I saw her, she underwent outpatient surgery last week for resection of a small anal wort  She tolerated this well.  He denies any chest pain.  She has not had any migraines over the past month and a half.  She presents for reevaluation.  Past Medical History:  Diagnosis Date  . Anemia   . Arthritis   . Chronic kidney disease    idiopathic micro hematuria   . Complication of anesthesia    patient states does not take much medicine to put her to sleep   . Edema    ankles to knees   . GERD (gastroesophageal reflux disease)   . Headache(784.0)    hx of migraines   . Hyperlipidemia   . Mitral valve prolapse    no  symptoms   . PONV (postoperative nausea and vomiting)   . Scalp alopecia   . Seizures ([place])    1 seizure 45 years ago; unknown etiology and no meds, no seizures since then.    Past Surgical History:  Procedure Laterality Date  . CERVICAL FUSION  2006   cervical 5-6   . COLONOSCOPY N/A 03/03/2017   Procedure: COLONOSCOPY;  Surgeon: Danie Binder, MD;  Location: [facility];  Service: Endoscopy;  Laterality: N/A;  1:15 pm  . HEMORRHOID SURGERY N/A 06/23/2017   Procedure: ANOSCOPY WITH REMOVAL OF SINGLE LESION;  Surgeon: [name], MD;  Location: [facility];  Service: General;  Laterality: N/A;  . MYOCARDIAL PERFUSION STUDY  07/02/2011   NO  SCINTIGRAPHIC EVIDENCE OF INDUCIBLE MYOCARDIAL ISCHEMIA. POST-STRESS EF IS 87%.EXERCISE CAPACITY 9 METS. EKG SHOWS NSR AT 69. THERE IS 1MM HORIZONTAL ST DEPRESSION WITH EXERCISE IN II, III, AVF SEEN BEST AT EARLY RECOVERY. THIS IS PRESENT IN V3-V5 AS WELL AND RETURNS TO BASELINE AT THE END OF THE TEST. NORMAL STUDY.  Marland Kitchen RENAL ARTERY DOPPLER  12/28/2003   CELIAC ARTERY: AT REST, 168.4 CM/S; INSPRIATION, 177.6 CM/S. ARCUATE LIGAMENT COMPRESSION SYNDROME. R & L KIDNEYS: RIGHT KIDNEY MEASURES SOME WHAT SMALLER THAN LEFT. KIDNEY ARE ESSENTIALLY SYMMETRICAL IN SHAPE WITH NO OBVIOUS ABNORMALITY VISUALIZED. R & L RENAL ARTERIES: DEMONSTRATE NORMAL SPECTRA. NO SUGGESTION OF DIAMETER REDUCTION, DISSECTION, FIBROMUSCULAR DYSPLASIA OR ANY OTHER VASCULAR ABNOR  . TOTAL KNEE ARTHROPLASTY Right 08/31/2013   Procedure: RIGHT TOTAL KNEE ARTHROPLASTY;  Surgeon: [name], MD;  Location: [facility];  Service: Orthopedics;  Laterality: Right;  . TOTAL KNEE ARTHROPLASTY Left 03/07/2015   Procedure: TOTAL KNEE ARTHROPLASTY;  Surgeon: [name], MD;  Location: [facility];  Service: Orthopedics;  Laterality: Left;  . TRANSTHORACIC ECHOCARDIOGRAM  12/05/2033   LV SYSTOLIC FUNCTION NORMAL, EF=>55%, INSIGNIFICANT PERICARDIAL EFFUSION, LEFT ATRIAL SIZE IS NORMAL, RV SYSTOLIC PRESSURE IS NORMAL. NO SIGN VALVULAR HEART DISEASE.  . TUBAL LIGATION  1986  . veins stripping      bilateral legs     Allergies  Allergen Reactions  . Metoprolol Rash  . Sporanox [Itraconazole] Swelling and Other (See Comments)    Bilateral knee and ankle swelling and pain     Current Outpatient Medications  Medication Sig Dispense Refill  . acetaminophen (TYLENOL) 500 MG tablet Take 500 mg by mouth 2 (two) times daily.     Marland Kitchen amLODipine (NORVASC) 2.5 MG tablet Take 1 tablet (2.5 mg total) by mouth daily. (Patient taking differently: Take 2.5 mg by mouth at bedtime. ) 90 tablet 3  . CALCIUM CITRATE PO Take 500 mg by mouth 3 (three) times daily.    .  candesartan (ATACAND) 32 MG tablet Take 32 mg by mouth daily.    . Carboxymethylcellulose Sodium (THERATEARS OP) Place 1 drop into both eyes as needed (for dry eyes).    . Coenzyme Q10 200 MG capsule Take 200 mg by mouth daily.     Marland Kitchen ezetimibe (ZETIA) 10 MG tablet Take 1 tablet (10 mg total) by mouth daily. 90 tablet 3  . Ferrous Sulfate (IRON) 325 (65 Fe) MG TABS Take 325 mg by mouth every evening.     . fexofenadine (ALLEGRA) 180 MG tablet Take 180 mg by mouth daily.    . Flaxseed, Linseed, (FLAX SEEDS PO) Take 1-2 capsules by mouth 2 (two) times daily. Take 1 capsule by mouth in the morning and take 2 capsules by mouth at bedtime.    Marland Kitchen  Glucosamine-Chondroitin 750-600 MG TABS Take 1 tablet by mouth 2 (two) times daily.     Marland Kitchen ipratropium (ATROVENT) 0.06 % nasal spray Place 2 sprays into both nostrils 2 (two) times daily as needed (for allergies.).     Marland Kitchen magnesium gluconate (MAGONATE) 500 MG tablet Take 500 mg by mouth daily at 12 noon.     . montelukast (SINGULAIR) 10 MG tablet Take 10 mg by mouth at bedtime.    . Multiple Vitamin (MULTIVITAMIN WITH MINERALS) TABS tablet Take 1 tablet by mouth daily. Centrum Silver    . Omega-3 Fatty Acids (FISH OIL) 1200 MG CAPS Take 1,200 mg by mouth 3 (three) times daily.     Marland Kitchen pyridOXINE (VITAMIN B-6) 100 MG tablet Take 100 mg by mouth daily.    . rosuvastatin (CRESTOR) 40 MG tablet TAKE 1 TABLET BY MOUTH AT BEDTIME. 90 tablet 2  . SUMAtriptan (IMITREX) 100 MG tablet Take 100 mg by mouth as needed for migraine or headache. May repeat in 2 hours if headache persists or recurs.     . SUMAtriptan Succinate Refill (IMITREX STATDOSE REFILL) 4 MG/0.5ML SOCT Inject 4 mg into the skin as needed (for migraines).     No current facility-administered medications for this visit.     Social History   Socioeconomic History  . Marital status: Widowed    Spouse name: Not on file  . Number of children: Not on file  . Years of education: Not on file  . Highest  education level: Not on file  Social Needs  . Financial resource strain: Not on file  . Food insecurity - worry: Not on file  . Food insecurity - inability: Not on file  . Transportation needs - medical: Not on file  . Transportation needs - non-medical: Not on file  Occupational History  . Not on file  Tobacco Use  . Smoking status: Former Smoker    Packs/day: 2.00    Years: 22.00    Pack years: 44.00    Types: Cigarettes    Last attempt to quit: 05/13/1988    Years since quitting: 29.1  . Smokeless tobacco: Never Used  Substance and Sexual Activity  . Alcohol use: Yes    Comment: wine- rarely.  . Drug use: No  . Sexual activity: Not Currently    Birth control/protection: Post-menopausal  Other Topics Concern  . Not on file  Social History Narrative  . Not on file   Social history is notable that she is married and has remained active. She does water aerobics and also rides a bicycle. She does not smoke, but there is a remote history of tobacco use having quit approximately 27 years ago. She has 2 children. She is the sister-in-law of Dr. Adella Hare and is married to Dr. Daylene Posey wife's brother.  She is retired Chief Technology Officer and retired at age 16.  Family History  Problem Relation Age of Onset  . Dementia Mother   . Heart attack Father   . Heart disease Father   . Kidney failure Father   . Heart attack Brother        age 32  . Cancer - Other Brother        kidney  . Colon cancer Paternal Aunt    ROS General: Negative; No fevers, chills, or night sweats;  HEENT: Negative; No changes in vision or hearing, sinus congestion, difficulty swallowing Pulmonary: Negative; No cough, wheezing, shortness of breath, hemoptysis Cardiovascular: See history of present illness GI:  Negative; No nausea, vomiting, diarrhea, or abdominal pain GU: Colonoscopy October 2018, status post recent outpatient surgery for an small anal wart Musculoskeletal: Negative; no myalgias,  joint pain, or weakness Hematologic/Oncology: Negative; no easy bruising, bleeding Endocrine: Negative; no heat/cold intolerance; no diabetes Neuro: Positive for history of migraines, currently approximately 3-4 per year Skin: Negative; No rashes or skin lesions Psychiatric: Negative; No behavioral problems, depression Sleep: Negative; No snoring, daytime sleepiness, hypersomnolence, bruxism, restless legs, hypnogognic hallucinations, no cataplexy Other comprehensive 14 point system review is negative.   PE BP (!) 108/58   Pulse 62   Ht 5' 5" (1.651 m)   Wt 153 lb 6.4 oz (69.6 kg)   BMI 25.53 kg/m    Wt Readings from Last 3 Encounters:  07/08/17 153 lb 6.4 oz (69.6 kg)  07/01/17 154 lb (69.9 kg)  06/19/17 156 lb (70.8 kg)   General: Alert, oriented, no distress.  Skin: normal turgor, no rashes, warm and dry HEENT: Normocephalic, atraumatic. Pupils equal round and reactive to light; sclera anicteric; extraocular muscles intact;  Nose without nasal septal hypertrophy Mouth/Parynx benign; Mallinpatti scale 2 Neck: No JVD, no carotid bruits; normal carotid upstroke Lungs: clear to ausculatation and percussion; no wheezing or rales Chest wall: without tenderness to palpitation Heart: PMI not displaced, RRR, s1 s2 normal, 1/6 systolic murmur, no diastolic murmur, no rubs, gallops, thrills, or heaves Abdomen: soft, nontender; no hepatosplenomehaly, BS+; abdominal aorta nontender and not dilated by palpation. Back: no CVA tenderness Pulses 2+ Musculoskeletal: full range of motion, normal strength, no joint deformities Extremities: no clubbing cyanosis or edema, Homan's sign negative  Neurologic: grossly nonfocal; Cranial nerves grossly wnl Psychologic: Normal mood and affect   ECG (independently read by me): Normal sinus rhythm with PAC.  PR interval 172 ms.  Nonspecific ST changes.  QTc interval 379 ms.  October 2018 ECG (independently read by me): Normal sinus rhythm at 69 bpm.   Nonspecific ST changes.  PR interval 156 more seconds, QTc interval 390 ms.  October 2017 ECG (independently read by me): Normal sinus rhythm at 64 bpm.  July 2016 ECG (independently read by me):  Normal sinus rhythm at 72 bpm. Borderline left atrial enlargement.  January 2016 ECG (independently read by me): Normal sinus rhythm at 69 bpm.  Normal intervals.  No significant ST segment changes.  January 2015 ECG (independently read by me): Normal sinus rhythm with nonspecific ST-T changes. Heart rate 75 beats per minute.  LABS:  I personally reviewed the repeat laboratory done on 01/28/2017 by Dr. Gerarda Fraction.   BMP Latest Ref Rng & Units 07/01/2017 06/19/2017 03/09/2015  Glucose 65 - 99 mg/dL 93 123(H) 143(H)  BUN 8 - 27 mg/dL 22 27(H) 21(H)  Creatinine 0.57 - 1.00 mg/dL 1.13(H) 1.33(H) 0.86  BUN/Creat Ratio 12 - 28 19 - -  Sodium 134 - 144 mmol/L 141 135 138  Potassium 3.5 - 5.2 mmol/L 4.8 4.7 4.3  Chloride 96 - 106 mmol/L 107(H) 102 107  CO2 20 - 29 mmol/L _0 Calcium 8.7 - 10.3 mg/dL 9.7 9.4 8.2(L)   Hepatic Function Latest Ref Rng & Units 07/01/2017 12/29/2014 05/10/2014  Total Protein 6.0 - 8.5 g/dL 6.9 6.8 6.9  Albumin 3.6 - 4.8 g/dL 4.3 3.9 4.1  AST 0 - 40 IU/L _1 ALT 0 - 32 IU/L _2 Alk Phosphatase 39 - 117 IU/L 63 73 69  Total Bilirubin 0.0 - 1.2 mg/dL 0.4 0.4 0.4   CBC  Latest Ref Rng & Units 06/19/2017 03/03/2017 03/09/2015  WBC 4.0 - 10.5 K/uL 5.6 4.5 10.2  Hemoglobin 12.0 - 15.0 g/dL 11.8(L) 10.9(L) 9.0(L)  Hematocrit 36.0 - 46.0 % 37.0 33.4(L) 28.0(L)  Platelets 150 - 400 K/uL 234 221 188   Lab Results  Component Value Date   MCV 91.6 06/19/2017   MCV 91.5 03/03/2017   MCV 85.9 03/09/2015   Lab Results  Component Value Date   TSH 2.014 12/29/2014   Lipid Panel     Component Value Date/Time   CHOL 136 07/01/2017 0932   TRIG 49 07/01/2017 0932   HDL 59 07/01/2017 0932   CHOLHDL 2.3 07/01/2017 0932   CHOLHDL 2.3 12/29/2014 0852   VLDL 11  12/29/2014 0852   LDLCALC 67 07/01/2017 0932     RADIOLOGY: No results found.  IMPRESSION:  1. Essential hypertension, benign   2. History of migraine headaches   3. Hyperlipidemia with target LDL less than 70   4. Insulin resistance   5. CKD (chronic kidney disease), stage III ([place])     ASSESSMENT AND PLAN: Mary [name] is 67 year old female who has a long-standing history of hypertension as well as hyperlipidemia.  She had developed a nonproductive cough on Lotrel, which led to discontinuance of ACE inhibition.  Was recently, her blood pressure has been well-controlled on candesartan 32 mg in addition to resumption of low-dose amlodipine at 2.5 mg.  This also has been effective in reducing her migraine headaches, which she frequently had experienced and had noted increased incidence when the Lotrel was discontinued.  Her blood pressure today is stable.  When I last saw her, I added Zetia to rosuvastatin 40 mg.  LDL is now excellent at 67.  She is tolerating this well without myalgias.  She has mild insulin resistance, and hemoglobin A1c is slightly elevated at 5.8.  I discussed diet, exercise, issues relative to glycemic indices of foods.  She also has mild renal insufficiency with an estimated GFR of 50, consistent with mild stage III chronic kidney disease.  Her creatinine is 1.13.  Her migraines have improved.  She underwent surgery as an outpatient last week and tolerated this well.  She will follow-up with her primary physician.  As long as she remains stable I will see her one year for reevaluation.  Time spent: 25 minutes   Troy Sine, MD, [facility]  07/08/2017 9:54 AM

## 2017-07-08 NOTE — Patient Instructions (Signed)
Medication Instructions:  Your physician recommends that you continue on your current medications as directed. Please refer to the Current Medication list given to you today.  Follow-Up: Your physician wants you to follow-up in: 12 months with Dr. Kelly.  You will receive a reminder letter in the mail two months in advance. If you don't receive a letter, please call our office to schedule the follow-up appointment.   If you need a refill on your cardiac medications before your next appointment, please call your pharmacy.   

## 2017-08-04 ENCOUNTER — Ambulatory Visit (INDEPENDENT_AMBULATORY_CARE_PROVIDER_SITE_OTHER): Payer: Medicare Other | Admitting: Otolaryngology

## 2017-08-04 DIAGNOSIS — R05 Cough: Secondary | ICD-10-CM

## 2017-08-04 DIAGNOSIS — J310 Chronic rhinitis: Secondary | ICD-10-CM

## 2017-08-04 DIAGNOSIS — H9313 Tinnitus, bilateral: Secondary | ICD-10-CM

## 2017-11-10 ENCOUNTER — Other Ambulatory Visit: Payer: Self-pay | Admitting: Cardiovascular Disease

## 2017-11-27 ENCOUNTER — Other Ambulatory Visit: Payer: Self-pay | Admitting: Cardiovascular Disease

## 2017-11-27 NOTE — Telephone Encounter (Signed)
Rx sent to pharmacy   

## 2018-01-28 ENCOUNTER — Other Ambulatory Visit (HOSPITAL_COMMUNITY): Payer: Self-pay | Admitting: Obstetrics and Gynecology

## 2018-01-28 DIAGNOSIS — Z1231 Encounter for screening mammogram for malignant neoplasm of breast: Secondary | ICD-10-CM

## 2018-02-12 ENCOUNTER — Telehealth: Payer: Self-pay | Admitting: Cardiovascular Disease

## 2018-02-12 NOTE — Telephone Encounter (Signed)
New Message   Patient is calling because she spoke with Dr. Gerarda Fraction office about her not having any stamina and they felt that she needed a stress test. They were suppose to send the order but we have not received it. The patient is wanting to know if by chance she can have a stress test ordered by Dr. Claiborne Billings before she goes out of town on 10/10. She is concern because she just has not had any energy. She did say that she has been having sob for a few days.    Pt c/o Shortness Of Breath: STAT if SOB developed within the last 24 hours or pt is noticeably SOB on the phone  1. Are you currently SOB (can you hear that pt is SOB on the phone)? no  2. How long have you been experiencing SOB? Since July  3. Are you SOB when sitting or when up moving around?  Moving around   4. Are you currently experiencing any other symptoms? Small amount of dizziness,

## 2018-02-12 NOTE — Telephone Encounter (Signed)
Pt called to report that she saw Dr. Gerarda Fraction and he wanted her to see cardio for a possible stress test due to her increasing weakness and sob.. She denies chest pain but he feels she should be evaluated. Pt says her sob is with exertion and she has no energy lately to do her normal running around. She denies edema in her extremities and denies dizziness and palpitations. She is concerned because she is going to [place] for a visit on 02/19/18.Mary [name] Appt made with Fabian Sharp PA on 02/16/18.Mary [name] Pt to call for worsening or new symptoms prior to her appt.

## 2018-02-13 ENCOUNTER — Telehealth: Payer: Self-pay | Admitting: Cardiovascular Disease

## 2018-02-13 NOTE — Telephone Encounter (Signed)
New message   Pt c/o of Chest Pain: STAT if CP now or developed within 24 hours  1. Are you having CP right now? No   2. Are you experiencing any other symptoms (ex. SOB, nausea, vomiting, sweating)? Sob ,hot flash   3. How long have you been experiencing CP? 02/12/2018  4. Is your CP continuous or coming and going? Coming and going   5. Have you taken Nitroglycerin? No  ?

## 2018-02-13 NOTE — Telephone Encounter (Signed)
Pt called back she states that yesterday she had chest pain and hot flash w/sweating and L-CP pain that radiates around to the back in between her shoulder blades. She denies any of these sx since "early morning" she states that she exercises and walks daily does not have a BP cuff to take her BP and states that it runs <670 systolic. Pt has appt on Monday and verbalizes that she will go directly should this pain return. Verbalizes understanding

## 2018-02-15 NOTE — Progress Notes (Signed)
Cardiology Office Note:    Date:  02/16/2018   ID:  [name], DOB 01/18/1951, MRN [id number]  PCP:  Redmond School, MD  Cardiologist:  Shelva Majestic, MD   Referring MD: Redmond School, MD   Chief Complaint  Patient presents with  . Follow-up    pt c/o fatigue and weakness, mild chest pain on friday    History of Present Illness:    Mary [name] is a 67 y.o. female with a hx of migraine headaches, hypertension, hyperlipidemia, PVD, GERD, and a strong family history of CAD and PVD.  She has been intolerant to Toprol in the past due to rash.  Her neurologist has placed her on candesartan for migraines, which resulted in an increase in her migraines. Dr. Claiborne Billings added Norvasc 2.5 mg to her regimen which resulted in a significant reduction in her migraines.  She was last seen in clinic on 07/08/2017 with Dr. Claiborne Billings.  At that time he added Zetia to her rosuvastatin 40 mg to try to reach her LDL goal less than 70.  Patient called the office on 02/13/2018 with complaints of chest pain.  She was advised to go to the ER but has chosen to be seen in clinic instead.  She returns today for follow-up of this chest pain. She reports intermittent shortness of breath, fatigue, and lowe stamina since the end of July with exertion. She could only work outside for 15-20 min at a time, which is a reduction in activity for her. She is generally very active. Rest, cooling off, and hydration resolved her symptoms. It seems her symptoms progressed this past Thursday 02/12/18. She had one episode of shortness of breath and chest heaviness at rest. The episode lasted 15-20 min and had no other associated symptoms. 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 She did not have a recurrence of symptoms over the weekend. She called her PCP office who said they would order a stress test, but this wasn't completed. The  patient is eager for an ischemic evaluation as she is traveling to [place] this Thursday. Of note, she recently took diamox for a patch on her spinal column, which increased her sCr to 1.3 (I do not have records). She denies nausea, vomiting, palpitations, dizziness, and syncope. She denies diaphoresis with the chest pain.   Past Medical History:  Diagnosis Date  . Anemia   . Arthritis   . Chronic kidney disease    idiopathic micro hematuria   . Complication of anesthesia    patient states does not take much medicine to put her to sleep   . Edema    ankles to knees   . GERD (gastroesophageal reflux disease)   . Headache(784.0)    hx of migraines   . Hyperlipidemia   . Mitral valve prolapse    no symptoms   . PONV (postoperative nausea and vomiting)   . Scalp alopecia   . Seizures ([place])    1 seizure 45 years ago; unknown etiology and no meds, no seizures since then.    Past Surgical History:  Procedure Laterality Date  . CERVICAL FUSION  2006   cervical 5-6   . COLONOSCOPY N/A 03/03/2017   Procedure: COLONOSCOPY;  Surgeon: Danie Binder, MD;  Location: [facility];  Service: Endoscopy;  Laterality: N/A;  1:15 pm  . HEMORRHOID SURGERY N/A 06/23/2017   Procedure: ANOSCOPY WITH REMOVAL OF  SINGLE LESION;  Surgeon: [name], MD;  Location: [facility];  Service: General;  Laterality: N/A;  . MYOCARDIAL PERFUSION STUDY  07/02/2011   NO SCINTIGRAPHIC EVIDENCE OF INDUCIBLE MYOCARDIAL ISCHEMIA. POST-STRESS EF IS 87%.EXERCISE CAPACITY 9 METS. EKG SHOWS NSR AT 69. THERE IS 1MM HORIZONTAL ST DEPRESSION WITH EXERCISE IN II, III, AVF SEEN BEST AT EARLY RECOVERY. THIS IS PRESENT IN V3-V5 AS WELL AND RETURNS TO BASELINE AT THE END OF THE TEST. NORMAL STUDY.  Marland Kitchen RENAL ARTERY DOPPLER  12/28/2003   CELIAC ARTERY: AT REST, 168.4 CM/S; INSPRIATION, 177.6 CM/S. ARCUATE LIGAMENT COMPRESSION SYNDROME. R & L KIDNEYS: RIGHT KIDNEY MEASURES SOME WHAT SMALLER THAN LEFT. KIDNEY ARE ESSENTIALLY SYMMETRICAL IN SHAPE  WITH NO OBVIOUS ABNORMALITY VISUALIZED. R & L RENAL ARTERIES: DEMONSTRATE NORMAL SPECTRA. NO SUGGESTION OF DIAMETER REDUCTION, DISSECTION, FIBROMUSCULAR DYSPLASIA OR ANY OTHER VASCULAR ABNOR  . TOTAL KNEE ARTHROPLASTY Right 08/31/2013   Procedure: RIGHT TOTAL KNEE ARTHROPLASTY;  Surgeon: [name], MD;  Location: [facility];  Service: Orthopedics;  Laterality: Right;  . TOTAL KNEE ARTHROPLASTY Left 03/07/2015   Procedure: TOTAL KNEE ARTHROPLASTY;  Surgeon: [name], MD;  Location: [facility];  Service: Orthopedics;  Laterality: Left;  . TRANSTHORACIC ECHOCARDIOGRAM  01/11/5175   LV SYSTOLIC FUNCTION NORMAL, EF=>55%, INSIGNIFICANT PERICARDIAL EFFUSION, LEFT ATRIAL SIZE IS NORMAL, RV SYSTOLIC PRESSURE IS NORMAL. NO SIGN VALVULAR HEART DISEASE.  . TUBAL LIGATION  1986  . veins stripping      bilateral legs     Current Medications: Current Meds  Medication Sig  . acetaminophen (TYLENOL) 500 MG tablet Take 500 mg by mouth 2 (two) times daily.   Marland Kitchen amLODipine (NORVASC) 2.5 MG tablet TAKE 1 TABLET BY MOUTH ONCE A DAY.  Marland Kitchen CALCIUM CITRATE PO Take 500 mg by mouth 3 (three) times daily.  . candesartan (ATACAND) 32 MG tablet Take 32 mg by mouth daily.  . Carboxymethylcellulose Sodium (THERATEARS OP) Place 1 drop into both eyes as needed (for dry eyes).  . Coenzyme Q10 200 MG capsule Take 200 mg by mouth daily.   Marland Kitchen ezetimibe (ZETIA) 10 MG tablet TAKE 1 TABLET BY MOUTH ONCE A DAY.  Marland Kitchen Ferrous Sulfate (IRON) 325 (65 Fe) MG TABS Take 325 mg by mouth every evening.   . fexofenadine (ALLEGRA) 180 MG tablet Take 180 mg by mouth daily.  . Flaxseed, Linseed, (FLAX SEEDS PO) Take 1-2 capsules by mouth 2 (two) times daily. Take 1 capsule by mouth in the morning and take 2 capsules by mouth at bedtime.  . Glucosamine-Chondroitin 750-600 MG TABS Take 1 tablet by mouth 2 (two) times daily.   Marland Kitchen ipratropium (ATROVENT) 0.06 % nasal spray Place 2 sprays into both nostrils 2 (two) times daily as needed (for allergies.).     Marland Kitchen magnesium gluconate (MAGONATE) 500 MG tablet Take 500 mg by mouth daily at 12 noon.   . montelukast (SINGULAIR) 10 MG tablet Take 10 mg by mouth at bedtime.  . Multiple Vitamin (MULTIVITAMIN WITH MINERALS) TABS tablet Take 1 tablet by mouth daily. Centrum Silver  . Omega-3 Fatty Acids (FISH OIL) 1200 MG CAPS Take 1,200 mg by mouth 3 (three) times daily.   Marland Kitchen pyridOXINE (VITAMIN B-6) 100 MG tablet Take 100 mg by mouth daily.  . rosuvastatin (CRESTOR) 40 MG tablet TAKE 1 TABLET BY MOUTH AT BEDTIME.  . SUMAtriptan (IMITREX) 100 MG tablet Take 100 mg by mouth as needed for migraine or headache. May repeat in 2 hours if headache persists or recurs.   Marland Kitchen  SUMAtriptan Succinate Refill (IMITREX STATDOSE REFILL) 4 MG/0.5ML SOCT Inject 4 mg into the skin as needed (for migraines).     Allergies:   Metoprolol and Sporanox [itraconazole]   Social History   Socioeconomic History  . Marital status: Widowed    Spouse name: Not on file  . Number of children: Not on file  . Years of education: Not on file  . Highest education level: Not on file  Occupational History  . Not on file  Social Needs  . Financial resource strain: Not on file  . Food insecurity:    Worry: Not on file    Inability: Not on file  . Transportation needs:    Medical: Not on file    Non-medical: Not on file  Tobacco Use  . Smoking status: Former Smoker    Packs/day: 2.00    Years: 22.00    Pack years: 44.00    Types: Cigarettes    Last attempt to quit: 05/13/1988    Years since quitting: 29.7  . Smokeless tobacco: Never Used  Substance and Sexual Activity  . Alcohol use: Yes    Comment: wine- rarely.  . Drug use: No  . Sexual activity: Not Currently    Birth control/protection: Post-menopausal  Lifestyle  . Physical activity:    Days per week: Not on file    Minutes per session: Not on file  . Stress: Not on file  Relationships  . Social connections:    Talks on phone: Not on file    Gets together: Not on file     Attends religious service: Not on file    Active member of club or organization: Not on file    Attends meetings of clubs or organizations: Not on file    Relationship status: Not on file  Other Topics Concern  . Not on file  Social History Narrative  . Not on file     Family History: The patient's family history includes Cancer - Other in her brother; Colon cancer in her paternal aunt; Dementia in her mother; Heart attack in her brother and father; Heart disease in her father; Kidney failure in her father.  ROS:   Please see the history of present illness.     All other systems reviewed and are negative.  EKGs/Labs/Other Studies Reviewed:    The following studies were reviewed today:  none  EKG:  EKG is ordered today.  The ekg ordered today demonstrates nonspecific ST changes stable from prior.   Recent Labs: 06/19/2017: Hemoglobin 11.8; Platelets 234 07/01/2017: ALT 16; BUN 22; Creatinine, Ser 1.13; Potassium 4.8; Sodium 141  Recent Lipid Panel    Component Value Date/Time   CHOL 136 07/01/2017 0932   TRIG 49 07/01/2017 0932   HDL 59 07/01/2017 0932   CHOLHDL 2.3 07/01/2017 0932   CHOLHDL 2.3 12/29/2014 0852   VLDL 11 12/29/2014 0852   LDLCALC 67 07/01/2017 0932    Physical Exam:    VS:  BP 126/78   Pulse 72   Ht 5\' 5"  (1.651 m)   Wt 153 lb (69.4 kg)   BMI 25.46 kg/m     Wt Readings from Last 3 Encounters:  02/16/18 153 lb (69.4 kg)  07/08/17 153 lb 6.4 oz (69.6 kg)  07/01/17 154 lb (69.9 kg)     GEN: Well nourished, well developed in no acute distress HEENT: Normal NECK: No JVD; No carotid bruits CARDIAC: RRR, no murmurs, rubs, gallops RESPIRATORY:  Clear to auscultation without rales, wheezing  or rhonchi  ABDOMEN: Soft, non-tender, non-distended MUSCULOSKELETAL:  No edema; No deformity  SKIN: Warm and dry NEUROLOGIC:  Alert and oriented x 3 PSYCHIATRIC:  Normal affect   ASSESSMENT:    1. Unstable angina ([place])   2. Dyspnea on exertion   3.  Essential hypertension    PLAN:    In order of problems listed above:  Unstable angina ([place]) - Plan: EKG 12-Lead, MYOCARDIAL PERFUSION IMAGING Dyspnea on exertion - Plan: EKG 12-Lead, ECHOCARDIOGRAM COMPLETE EKG appears unchanged from prior, but symptoms are concerning for stable angina that has now progressed to unstable angina. Given her medication-induced AKI and upcoming trip to [place], will order a lexiscan myoview and echocardiogram for tomorrow.    Essential hypertension Pressure controlled. She is doing well on norvasc and candesartan. No medication changes.   Return in 3 months with Dr. Claiborne Billings.    Medication Adjustments/Labs and Tests Ordered: Current medicines are reviewed at length with the patient today.  Concerns regarding medicines are outlined above.  Orders Placed This Encounter  Procedures  . MYOCARDIAL PERFUSION IMAGING  . EKG 12-Lead  . ECHOCARDIOGRAM COMPLETE   No orders of the defined types were placed in this encounter.   Signed, Ledora Bottcher, PA  02/16/2018 12:01 PM    [facility]

## 2018-02-16 ENCOUNTER — Ambulatory Visit: Payer: Medicare Other | Admitting: Physician Assistant

## 2018-02-16 ENCOUNTER — Encounter: Payer: Self-pay | Admitting: Physician Assistant

## 2018-02-16 VITALS — BP 126/78 | HR 72 | Ht 65.0 in | Wt 153.0 lb

## 2018-02-16 DIAGNOSIS — I10 Essential (primary) hypertension: Secondary | ICD-10-CM | POA: Diagnosis not present

## 2018-02-16 DIAGNOSIS — I200 Unstable angina: Secondary | ICD-10-CM

## 2018-02-16 DIAGNOSIS — R0609 Other forms of dyspnea: Secondary | ICD-10-CM

## 2018-02-16 NOTE — Patient Instructions (Signed)
Medication Instructions:  [name], PA recommends that you continue on your current medications as directed. Please refer to the Current Medication list given to you today. If you need a refill on your cardiac medications before your next appointment, please call your pharmacy.   Lab work: NONE ORDERED If you have labs (blood work) drawn today and your tests are completely normal, you will receive your results only by: Marland Kitchen MyChart Message (if you have MyChart) OR . A paper copy in the mail If you have any lab test that is abnormal or we need to change your treatment, we will call you to review the results.  Testing/Procedures: 1. Echocardiogram - Your physician has requested that you have an echocardiogram. Echocardiography is a painless test that uses sound waves to create images of your heart. It provides your doctor with information about the size and shape of your heart and how well your heart's chambers and valves are working. This procedure takes approximately one hour. There are no restrictions for this procedure.  [place] Stress test - Your physician has requested that you have a lexiscan myoview. For further information please visit [email or web address]. Please follow instruction sheet, as given.  >> These tests will be performed at [place]: At [facility], you and your health needs are our priority.  As part of our continuing mission to provide you with exceptional heart care, we have created designated Provider Care Teams.  These Care Teams include your primary Cardiologist (physician) and Advanced Practice Providers (APPs -  Physician Assistants and Nurse Practitioners) who all work together to provide you with the care you need, when you need it. You will need a follow up appointment in 3 months.  Please call our office 2 months in advance to schedule this appointment.  You may see Shelva Majestic, MD or one of the following Advanced Practice  Providers on your designated Care Team: [place], [place] . Fabian Sharp, PA-C

## 2018-02-16 NOTE — Addendum Note (Signed)
Addended by: Diana Eves on: 02/16/2018 03:48 PM   Modules accepted: Orders

## 2018-02-17 ENCOUNTER — Ambulatory Visit (HOSPITAL_COMMUNITY)
Admission: RE | Admit: 2018-02-17 | Discharge: 2018-02-17 | Disposition: A | Discharge status: Home / Self Care | Payer: Medicare Other | Source: Ambulatory Visit | Attending: Physician Assistant | Admitting: Physician Assistant

## 2018-02-17 ENCOUNTER — Encounter (HOSPITAL_COMMUNITY): Payer: Medicare Other

## 2018-02-17 ENCOUNTER — Encounter (HOSPITAL_COMMUNITY)
Admission: RE | Admit: 2018-02-17 | Discharge: 2018-02-17 | Disposition: A | Discharge status: Home / Self Care | Payer: Medicare Other | Source: Ambulatory Visit | Attending: Physician Assistant | Admitting: Physician Assistant

## 2018-02-17 ENCOUNTER — Encounter (HOSPITAL_BASED_OUTPATIENT_CLINIC_OR_DEPARTMENT_OTHER)
Admission: RE | Admit: 2018-02-17 | Discharge: 2018-02-17 | Disposition: A | Discharge status: Home / Self Care | Payer: Medicare Other | Source: Ambulatory Visit | Attending: Physician Assistant | Admitting: Physician Assistant

## 2018-02-17 ENCOUNTER — Encounter (HOSPITAL_COMMUNITY): Payer: Self-pay

## 2018-02-17 DIAGNOSIS — R0609 Other forms of dyspnea: Secondary | ICD-10-CM

## 2018-02-17 DIAGNOSIS — I200 Unstable angina: Secondary | ICD-10-CM | POA: Insufficient documentation

## 2018-02-17 DIAGNOSIS — E785 Hyperlipidemia, unspecified: Secondary | ICD-10-CM | POA: Diagnosis not present

## 2018-02-17 DIAGNOSIS — R002 Palpitations: Secondary | ICD-10-CM | POA: Diagnosis not present

## 2018-02-17 DIAGNOSIS — I10 Essential (primary) hypertension: Secondary | ICD-10-CM | POA: Insufficient documentation

## 2018-02-17 LAB — NM MYOCAR MULTI W/SPECT W/WALL MOTION / EF
CHL CUP NUCLEAR SDS: 1
CHL CUP NUCLEAR SRS: 1
CHL CUP NUCLEAR SSS: 2
CSEPPHR: 90 {beats}/min
LHR: 0.6
LV dias vol: 69 mL (ref 46–106)
LV sys vol: 23 mL
Rest HR: 67 {beats}/min
TID: 1.02

## 2018-02-17 MED ORDER — REGADENOSON 0.4 MG/5ML IV SOLN
INTRAVENOUS | Status: AC
  Administered 2018-02-17: 0.4 mg via INTRAVENOUS
  Filled 2018-02-17: qty 5

## 2018-02-17 MED ORDER — SODIUM CHLORIDE 0.9% FLUSH
INTRAVENOUS | Status: AC
  Administered 2018-02-17: 10 mL via INTRAVENOUS
  Filled 2018-02-17: qty 10

## 2018-02-17 MED ORDER — TECHNETIUM TC 99M TETROFOSMIN IV KIT
30.0000 | PACK | Freq: Once | INTRAVENOUS | Status: AC | PRN
  Administered 2018-02-17: 32 via INTRAVENOUS

## 2018-02-17 MED ORDER — TECHNETIUM TC 99M TETROFOSMIN IV KIT
10.0000 | PACK | Freq: Once | INTRAVENOUS | Status: AC | PRN
  Administered 2018-02-17: 11 via INTRAVENOUS

## 2018-02-17 NOTE — Progress Notes (Signed)
*  PRELIMINARY RESULTS* Echocardiogram 2D Echocardiogram has been performed.  Samuel Germany 02/17/2018, 10:38 AM

## 2018-03-13 ENCOUNTER — Ambulatory Visit (HOSPITAL_COMMUNITY)
Admission: RE | Admit: 2018-03-13 | Discharge: 2018-03-13 | Disposition: A | Discharge status: Home / Self Care | Payer: Medicare Other | Source: Ambulatory Visit | Attending: Obstetrics and Gynecology | Admitting: Obstetrics and Gynecology

## 2018-03-13 ENCOUNTER — Encounter (HOSPITAL_COMMUNITY): Payer: Self-pay

## 2018-03-13 DIAGNOSIS — Z1231 Encounter for screening mammogram for malignant neoplasm of breast: Secondary | ICD-10-CM | POA: Insufficient documentation

## 2018-05-25 ENCOUNTER — Ambulatory Visit: Payer: Medicare Other | Admitting: Cardiovascular Disease

## 2018-05-25 ENCOUNTER — Encounter: Payer: Self-pay | Admitting: Cardiovascular Disease

## 2018-05-25 VITALS — BP 124/68 | HR 65 | Ht 65.0 in | Wt 159.2 lb

## 2018-05-25 DIAGNOSIS — I10 Essential (primary) hypertension: Secondary | ICD-10-CM

## 2018-05-25 DIAGNOSIS — N183 Chronic kidney disease, stage 3 unspecified: Secondary | ICD-10-CM

## 2018-05-25 DIAGNOSIS — R0789 Other chest pain: Secondary | ICD-10-CM

## 2018-05-25 DIAGNOSIS — K219 Gastro-esophageal reflux disease without esophagitis: Secondary | ICD-10-CM

## 2018-05-25 DIAGNOSIS — Z8669 Personal history of other diseases of the nervous system and sense organs: Secondary | ICD-10-CM

## 2018-05-25 DIAGNOSIS — E7800 Pure hypercholesterolemia, unspecified: Secondary | ICD-10-CM | POA: Diagnosis not present

## 2018-05-25 MED ORDER — AMLODIPINE BESYLATE 5 MG PO TABS: 5.0000 mg | ORAL_TABLET | Freq: Every day | ORAL | 1 refills | Status: DC

## 2018-05-25 NOTE — Progress Notes (Signed)
Patient ID: Mary [name], female   DOB: 06-Apr-1951, 68 y.o.   MRN: [id number]     HPI: Mary [name] is a 68 y.o. female presents to the office for an 61 month cardiology evaluation.  [name] has a long-standing history of migraine headaches,  hypertension, hyperlipidemia, GERD, and has strong family history for both coronary as well as peripheral vascular disease. She also has PVD with lower extremity venous abnormalities. Remotely, she had been on pravastatin for hyperlipidemia but this was switched to Crestor do to inadequate response to pravastatin.   When I saw her over one year ago, she was stable on amlodipine 10 mg, benazepril 10 mg for hypetension as well as her migraine headaches and Crestor 40 mg for hyperlipidemia.   She had developed a rash and stopped taking Toprol-XL 12.5 mg.  She denied any palpitations.  Following its discontinuance or awareness of blood pressure elevation.  Over the past year, she has been bothered by chronic bronchitis and allergic cough.  She also underwent left knee replacement in October 2016 after previously undergoing right knee replacement in April 2015.  She has seen Dr. Gerarda Fraction in July and apparently complete set of blood work was done and she was told that these were within normal limits.  She denies any chest pain.  She is unaware of recurrent palpitations.  She denies presyncope or syncope.  She completed 3 courses of antibiotics.  She will be having a follow-up ENT evaluation later this week.  A chest x-ray on 02/12/2016 did not reveal any active cardiopulmonary disease.   She had developed a dry hacking cough and also was having swelling wall on amlodipine/benazepril combination 10/10.  Her neurologist who takes care of her migraines ultimately switched her to candesartan and she is now on 32 mg daily.  She believes her migraine frequency has increased with the change of medication.  Previously when she was on Lotrel.  She would experience 4  migraines per year and since February 2018 had experienced 15, although less intense than previously.  When I saw her, I added back amlodipine at 2.5 mg.  This has resulted in significant reduction in her migraines  She had undergone blood work by Dr. Gerarda Fraction in 2018.  Her cholesterol was 174 with an LDL of 92 and triglycerides of 67.  When I saw her in October 2018, I added Zetia 10 mg to her rosuvastatin 40 mg in attempt to reach an LDL less than 70.    Subsequent blood work has shown benefit and when I last saw her in February 2019  recent LDL 1 week previously was 67.  HbA1c will A1c was minimally increased at 5.8.  These were normal.  She underwent outpatient surgery last week for resection of a small anal wort  She tolerated this well.    Since I last saw her, she had been doing fairly well but follow-up some episodes of fatigability and chest heaviness in October which led to an evaluation by [name], PAC.  An echo Doppler study on February 17, 2018 showed an EF of 60 to 65%.  She had normal wall motion.  Nuclear perfusion study showed a small defect was felt most likely due to breast attenuation rather than ischemia.  He was low risk study.  Nuclear stress ejection fraction was 67%.  Subsequently, she has kept a log of episodes of chest heaviness.  These typically are nonexertional.  She also has been monitoring her blood pressure which consistently  has been stable.  She brought this log with her to the office today which I have reviewed.  She presents for reevaluation.  Past Medical History:  Diagnosis Date  . Anemia   . Arthritis   . Chronic kidney disease    idiopathic micro hematuria   . Complication of anesthesia    patient states does not take much medicine to put her to sleep   . Edema    ankles to knees   . GERD (gastroesophageal reflux disease)   . Headache(784.0)    hx of migraines   . Hyperlipidemia   . Mitral valve prolapse    no symptoms   . PONV (postoperative nausea and  vomiting)   . Scalp alopecia   . Seizures ([place])    1 seizure 45 years ago; unknown etiology and no meds, no seizures since then.    Past Surgical History:  Procedure Laterality Date  . CERVICAL FUSION  2006   cervical 5-6   . COLONOSCOPY N/A 03/03/2017   Procedure: COLONOSCOPY;  Surgeon: Danie Binder, MD;  Location: [facility];  Service: Endoscopy;  Laterality: N/A;  1:15 pm  . HEMORRHOID SURGERY N/A 06/23/2017   Procedure: ANOSCOPY WITH REMOVAL OF SINGLE LESION;  Surgeon: [name], MD;  Location: [facility];  Service: General;  Laterality: N/A;  . MYOCARDIAL PERFUSION STUDY  07/02/2011   NO SCINTIGRAPHIC EVIDENCE OF INDUCIBLE MYOCARDIAL ISCHEMIA. POST-STRESS EF IS 87%.EXERCISE CAPACITY 9 METS. EKG SHOWS NSR AT 69. THERE IS 1MM HORIZONTAL ST DEPRESSION WITH EXERCISE IN II, III, AVF SEEN BEST AT EARLY RECOVERY. THIS IS PRESENT IN V3-V5 AS WELL AND RETURNS TO BASELINE AT THE END OF THE TEST. NORMAL STUDY.  Marland Kitchen RENAL ARTERY DOPPLER  12/28/2003   CELIAC ARTERY: AT REST, 168.4 CM/S; INSPRIATION, 177.6 CM/S. ARCUATE LIGAMENT COMPRESSION SYNDROME. R & L KIDNEYS: RIGHT KIDNEY MEASURES SOME WHAT SMALLER THAN LEFT. KIDNEY ARE ESSENTIALLY SYMMETRICAL IN SHAPE WITH NO OBVIOUS ABNORMALITY VISUALIZED. R & L RENAL ARTERIES: DEMONSTRATE NORMAL SPECTRA. NO SUGGESTION OF DIAMETER REDUCTION, DISSECTION, FIBROMUSCULAR DYSPLASIA OR ANY OTHER VASCULAR ABNOR  . TOTAL KNEE ARTHROPLASTY Right 08/31/2013   Procedure: RIGHT TOTAL KNEE ARTHROPLASTY;  Surgeon: [name], MD;  Location: [facility];  Service: Orthopedics;  Laterality: Right;  . TOTAL KNEE ARTHROPLASTY Left 03/07/2015   Procedure: TOTAL KNEE ARTHROPLASTY;  Surgeon: [name], MD;  Location: [facility];  Service: Orthopedics;  Laterality: Left;  . TRANSTHORACIC ECHOCARDIOGRAM  1/61/0960   LV SYSTOLIC FUNCTION NORMAL, EF=>55%, INSIGNIFICANT PERICARDIAL EFFUSION, LEFT ATRIAL SIZE IS NORMAL, RV SYSTOLIC PRESSURE IS NORMAL. NO SIGN VALVULAR HEART DISEASE.  .  TUBAL LIGATION  1986  . veins stripping      bilateral legs     Allergies  Allergen Reactions  . Metoprolol Rash  . Sporanox [Itraconazole] Swelling and Other (See Comments)    Bilateral knee and ankle swelling and pain     Current Outpatient Medications  Medication Sig Dispense Refill  . acetaminophen (TYLENOL) 500 MG tablet Take 500 mg by mouth 2 (two) times daily.     Marland Kitchen amLODipine (NORVASC) 5 MG tablet Take 1 tablet (5 mg total) by mouth daily. 90 tablet 1  . CALCIUM CITRATE PO Take 500 mg by mouth 3 (three) times daily.    . candesartan (ATACAND) 32 MG tablet Take 32 mg by mouth daily.    . Carboxymethylcellulose Sodium (THERATEARS OP) Place 1 drop into both eyes as needed (for dry eyes).    . Cinnamon 500  MG capsule Take 500 mg by mouth daily.    . Coenzyme Q10 200 MG capsule Take 200 mg by mouth daily.     Marland Kitchen ezetimibe (ZETIA) 10 MG tablet TAKE 1 TABLET BY MOUTH ONCE A DAY. 90 tablet 0  . Ferrous Sulfate (IRON) 325 (65 Fe) MG TABS Take 325 mg by mouth every evening.     . fexofenadine (ALLEGRA) 180 MG tablet Take 180 mg by mouth daily.    . Glucosamine-Chondroitin 750-600 MG TABS Take 1 tablet by mouth 2 (two) times daily.     Marland Kitchen ipratropium (ATROVENT) 0.06 % nasal spray Place 2 sprays into both nostrils 2 (two) times daily as needed (for allergies.).     Marland Kitchen magnesium gluconate (MAGONATE) 500 MG tablet Take 500 mg by mouth daily at 12 noon.     . montelukast (SINGULAIR) 10 MG tablet Take 10 mg by mouth at bedtime.    . Multiple Vitamin (MULTIVITAMIN WITH MINERALS) TABS tablet Take 1 tablet by mouth daily. Centrum Silver    . Omega-3 Fatty Acids (FISH OIL) 1200 MG CAPS Take 1,200 mg by mouth 3 (three) times daily.     Marland Kitchen pyridOXINE (VITAMIN B-6) 100 MG tablet Take 100 mg by mouth daily.    . rosuvastatin (CRESTOR) 40 MG tablet TAKE 1 TABLET BY MOUTH AT BEDTIME. 90 tablet 2  . SUMAtriptan (IMITREX) 100 MG tablet Take 100 mg by mouth as needed for migraine or headache. May repeat in 2  hours if headache persists or recurs.     . SUMAtriptan Succinate Refill (IMITREX STATDOSE REFILL) 4 MG/0.5ML SOCT Inject 4 mg into the skin as needed (for migraines).    . thiamine (VITAMIN B-1) 100 MG tablet Take 100 mg by mouth daily.     No current facility-administered medications for this visit.     Social History   Socioeconomic History  . Marital status: Widowed    Spouse name: Not on file  . Number of children: Not on file  . Years of education: Not on file  . Highest education level: Not on file  Occupational History  . Not on file  Social Needs  . Financial resource strain: Not on file  . Food insecurity:    Worry: Not on file    Inability: Not on file  . Transportation needs:    Medical: Not on file    Non-medical: Not on file  Tobacco Use  . Smoking status: Former Smoker    Packs/day: 2.00    Years: 22.00    Pack years: 44.00    Types: Cigarettes    Last attempt to quit: 05/13/1988    Years since quitting: 30.0  . Smokeless tobacco: Never Used  Substance and Sexual Activity  . Alcohol use: Yes    Comment: wine- rarely.  . Drug use: No  . Sexual activity: Not Currently    Birth control/protection: Post-menopausal  Lifestyle  . Physical activity:    Days per week: Not on file    Minutes per session: Not on file  . Stress: Not on file  Relationships  . Social connections:    Talks on phone: Not on file    Gets together: Not on file    Attends religious service: Not on file    Active member of club or organization: Not on file    Attends meetings of clubs or organizations: Not on file    Relationship status: Not on file  . Intimate partner violence:  Fear of current or ex partner: Not on file    Emotionally abused: Not on file    Physically abused: Not on file    Forced sexual activity: Not on file  Other Topics Concern  . Not on file  Social History Narrative  . Not on file   Social history is notable that she is married and has remained  active. She does water aerobics and also rides a bicycle. She does not smoke, but there is a remote history of tobacco use having quit approximately 27 years ago. She has 2 children. She is the sister-in-law of Dr. Adella Hare and is married to Dr. Daylene Posey wife's brother.  She is retired Chief Technology Officer and retired at age 56.  Family History  Problem Relation Age of Onset  . Dementia Mother   . Heart attack Father   . Heart disease Father   . Kidney failure Father   . Heart attack Brother        age 59  . Cancer - Other Brother        kidney  . Colon cancer Paternal Aunt    ROS General: Negative; No fevers, chills, or night sweats;  HEENT: Negative; No changes in vision or hearing, sinus congestion, difficulty swallowing Pulmonary: Negative; No cough, wheezing, shortness of breath, hemoptysis Cardiovascular: See history of present illness GI: Negative; No nausea, vomiting, diarrhea, or abdominal pain GU: Colonoscopy October 2018, status post outpatient surgery for an small anal wart Musculoskeletal: Negative; no myalgias, joint pain, or weakness Hematologic/Oncology: Negative; no easy bruising, bleeding Endocrine: Negative; no heat/cold intolerance; no diabetes Neuro: Positive for history of migraines, currently approximately 3-4 per year Skin: Negative; No rashes or skin lesions Psychiatric: Negative; No behavioral problems, depression Sleep: Negative; No snoring, daytime sleepiness, hypersomnolence, bruxism, restless legs, hypnogognic hallucinations, no cataplexy Other comprehensive 14 point system review is negative.   PE BP 124/68   Pulse 65   Ht '5\' 5"'  (1.651 m)   Wt 159 lb 3.2 oz (72.2 kg)   BMI 26.49 kg/m    Repeat blood pressure by me was 120/76  Wt Readings from Last 3 Encounters:  05/25/18 159 lb 3.2 oz (72.2 kg)  02/16/18 153 lb (69.4 kg)  07/08/17 153 lb 6.4 oz (69.6 kg)   General: Alert, oriented, no distress.  Skin: normal turgor, no rashes,  warm and dry HEENT: Normocephalic, atraumatic. Pupils equal round and reactive to light; sclera anicteric; extraocular muscles intact;  Nose without nasal septal hypertrophy Mouth/Parynx benign; Mallinpatti scale 2 Neck: No JVD, no carotid bruits; normal carotid upstroke Lungs: clear to ausculatation and percussion; no wheezing or rales Chest wall: without tenderness to palpitation Heart: PMI not displaced, RRR, s1 s2 normal, 1/6 systolic murmur, no diastolic murmur, no rubs, gallops, thrills, or heaves Abdomen: soft, nontender; no hepatosplenomehaly, BS+; abdominal aorta nontender and not dilated by palpation. Back: no CVA tenderness Pulses 2+ Musculoskeletal: full range of motion, normal strength, no joint deformities Extremities: no clubbing cyanosis or edema, Homan's sign negative  Neurologic: grossly nonfocal; Cranial nerves grossly wnl Psychologic: Normal mood and affect  ECG (independently read by me): Sinus rhythm with PACs with ventricular rate of 65 bpm.  No ectopy.  February 2019 ECG (independently read by me): Normal sinus rhythm with PAC.  PR interval 172 ms.  Nonspecific ST changes.  QTc interval 379 ms.  October 2018 ECG (independently read by me): Normal sinus rhythm at 69 bpm.  Nonspecific ST changes.  PR interval 156 more  seconds, QTc interval 390 ms.  October 2017 ECG (independently read by me): Normal sinus rhythm at 64 bpm.  July 2016 ECG (independently read by me):  Normal sinus rhythm at 72 bpm. Borderline left atrial enlargement.  January 2016 ECG (independently read by me): Normal sinus rhythm at 69 bpm.  Normal intervals.  No significant ST segment changes.  January 2015 ECG (independently read by me): Normal sinus rhythm with nonspecific ST-T changes. Heart rate 75 beats per minute.  LABS:  I personally reviewed the repeat laboratory done on 01/28/2017 by Dr. Gerarda Fraction.  Most recent lipid studies from February 04, 2018 reveal a total cholesterol 132, HDL 55,  LDL 60, triglycerides 87.   BMP Latest Ref Rng & Units 07/01/2017 06/19/2017 03/09/2015  Glucose 65 - 99 mg/dL 93 123(H) 143(H)  BUN 8 - 27 mg/dL 22 27(H) 21(H)  Creatinine 0.57 - 1.00 mg/dL 1.13(H) 1.33(H) 0.86  BUN/Creat Ratio 12 - 28 19 - -  Sodium 134 - 144 mmol/L 141 135 138  Potassium 3.5 - 5.2 mmol/L 4.8 4.7 4.3  Chloride 96 - 106 mmol/L 107(H) 102 107  CO2 20 - 29 mmol/L '20 23 26  ' Calcium 8.7 - 10.3 mg/dL 9.7 9.4 8.2(L)   Hepatic Function Latest Ref Rng & Units 07/01/2017 12/29/2014 05/10/2014  Total Protein 6.0 - 8.5 g/dL 6.9 6.8 6.9  Albumin 3.6 - 4.8 g/dL 4.3 3.9 4.1  AST 0 - 40 IU/L '23 19 19  ' ALT 0 - 32 IU/L '16 17 17  ' Alk Phosphatase 39 - 117 IU/L 63 73 69  Total Bilirubin 0.0 - 1.2 mg/dL 0.4 0.4 0.4   CBC Latest Ref Rng & Units 06/19/2017 03/03/2017 03/09/2015  WBC 4.0 - 10.5 K/uL 5.6 4.5 10.2  Hemoglobin 12.0 - 15.0 g/dL 11.8(L) 10.9(L) 9.0(L)  Hematocrit 36.0 - 46.0 % 37.0 33.4(L) 28.0(L)  Platelets 150 - 400 K/uL 234 221 188   Lab Results  Component Value Date   MCV 91.6 06/19/2017   MCV 91.5 03/03/2017   MCV 85.9 03/09/2015   Lab Results  Component Value Date   TSH 2.014 12/29/2014   Lipid Panel     Component Value Date/Time   CHOL 136 07/01/2017 0932   TRIG 49 07/01/2017 0932   HDL 59 07/01/2017 0932   CHOLHDL 2.3 07/01/2017 0932   CHOLHDL 2.3 12/29/2014 0852   VLDL 11 12/29/2014 0852   LDLCALC 67 07/01/2017 0932     RADIOLOGY: No results found.  IMPRESSION:  1. Essential hypertension   2. Pure hypercholesterolemia   3. Chest heaviness   4. CKD (chronic kidney disease), stage III ([place])   5. Gastroesophageal reflux disease without esophagitis   6. History of migraine headaches     ASSESSMENT AND PLAN: Ms. Dowers is 68 year old female who has a long-standing history of hypertension as well as hyperlipidemia.  She had developed a nonproductive cough on Lotrel, which led to discontinuance of ACE inhibition.  Her blood pressure today is well  controlled on low-dose amlodipine 2.5 mg, candesartan 32 mg.  He has continued to experience episodic chest heaviness which is nonexertional.  I reviewed her most recent echo Doppler study as well as her nuclear evaluation which revealed an apical to basal inferoseptal defect.  I suspect this most likely is due to soft tissue attenuation rather than scar in the setting of normal wall motion.  However, with her continued chest heaviness which perhaps may also be associated with spasm of her esophagus or coronary spasm I have  suggested slight additional titration of amlodipine up to 5 mg. I also have suggested a trial of OTC antacid Rx.  She continues to be on rosuvastatin 40 mg and zetia 10 mg for hyperlipidemia.  Most recent lipid studies are excellent with her LDL cholesterol at 60.  She has a history of migraine headaches but these have been stable.  Her most recent creatinine was 1.34 in September 2019 is consistent with Stage III CKD. I will see her in 6 months for re-evaluation.   Time spent: 25 minutes  Troy Sine, MD, [facility]  05/26/2018 1:07 PM

## 2018-05-25 NOTE — Patient Instructions (Addendum)
Medication Instructions:  Increase Amlodipine to 5 mg daily.  If you need a refill on your cardiac medications before your next appointment, please call your pharmacy.    Follow-Up: At [facility], you and your health needs are our priority.  As part of our continuing mission to provide you with exceptional heart care, we have created designated Provider Care Teams.  These Care Teams include your primary Cardiologist (physician) and Advanced Practice Providers (APPs -  Physician Assistants and Nurse Practitioners) who all work together to provide you with the care you need, when you need it. You will need a follow up appointment in 6 months.  Please call our office 2 months in advance to schedule this appointment.  You may see Shelva Majestic, MD or one of the following Advanced Practice Providers on your designated Care Team: Rena Lara, [place] . Fabian Sharp, PA-C

## 2018-05-26 ENCOUNTER — Encounter: Payer: Self-pay | Admitting: Cardiovascular Disease

## 2018-05-31 ENCOUNTER — Other Ambulatory Visit: Payer: Self-pay | Admitting: Cardiovascular Disease

## 2018-08-03 ENCOUNTER — Ambulatory Visit (INDEPENDENT_AMBULATORY_CARE_PROVIDER_SITE_OTHER): Payer: Medicare Other | Admitting: Otolaryngology

## 2018-08-03 DIAGNOSIS — H903 Sensorineural hearing loss, bilateral: Secondary | ICD-10-CM | POA: Diagnosis not present

## 2018-08-03 DIAGNOSIS — J310 Chronic rhinitis: Secondary | ICD-10-CM | POA: Diagnosis not present

## 2018-08-26 ENCOUNTER — Other Ambulatory Visit: Payer: Self-pay | Admitting: Cardiovascular Disease

## 2018-08-26 NOTE — Telephone Encounter (Signed)
Amlodipine 5 mg refilled. 

## 2018-08-31 ENCOUNTER — Other Ambulatory Visit: Payer: Self-pay | Admitting: Cardiovascular Disease

## 2018-08-31 NOTE — Telephone Encounter (Signed)
zetia 10 mg

## 2018-11-10 ENCOUNTER — Other Ambulatory Visit: Payer: Self-pay | Admitting: Cardiovascular Disease

## 2018-11-23 ENCOUNTER — Ambulatory Visit: Payer: Medicare Other | Admitting: Cardiovascular Disease

## 2018-12-27 NOTE — Progress Notes (Signed)
Cardiology Office Note   Date:  12/27/2018   ID:  Mary [name], DOB March 27, 1951, MRN [id number]  PCP:  Redmond School, MD  Cardiologist: Dr. Claiborne Billings No chief complaint on file.    History of Present Illness: Mary [name] is a 68 y.o. female who presents for ongoing assessment and management of hypertension, hyperlipidemia, with other history to include GERD, chronic migraines Stage II chronic kidney disease, and strong family history for CAD and PAD.  Echocardiogram in October 2019 revealed an EF of 60% to 65% with normal wall motion.  She had a nuclear medicine perfusion study revealing a small defect that was felt most likely due to breast attenuation rather than ischemia and found to be a low risk study.  She was last seen by Dr. Claiborne Billings on 05/25/2018.  It was noted that she was unable to tolerate amlodipine benazepril combination due to dry hacking cough and lower extremity edema.  She was switched to candesartan 32 mg daily, and amlodipine 2.5 mg daily.  The patient has easy fatigability with complaint of chest heaviness with exertion.  On that visit, blood pressure was noted to be well controlled with amlodipine and candesartan.  However she was complaining of some chest heaviness and therefore amlodipine was increased to 5 mg daily.  She was also suggested a trial of OTC and antacid.  She was continued on rosuvastatin and Zetia for hyperlipidemia.  At the time her LDL was 60.    She presents to the clinic today and states she has decreased her amlodipine to 2.5 mg and noticed less swelling.  Her blood pressure continues to be well managed.  She indicates her systolic blood pressures been running in the 100s to 110s over the 60s to 70s.  Her blood pressure today is 138/72.  She continues to be active maintaining 1 of the local walking trails and is active for 30 minutes or more 7 days/week.  She is also started water aerobics again 3 times per week.  She enjoys traveling but is concerned  about the coronavirus and has decided to discontinue her travel at this time until a vaccine becomes available.  She denies chest pain, shortness of breath, lower extremity edema, fatigue, palpitations, melena, hematuria, hemoptysis, diaphoresis, weakness, presyncope, syncope, orthopnea, and PND.  Past Medical History:  Diagnosis Date  . Anemia   . Arthritis   . Chronic kidney disease    idiopathic micro hematuria   . Complication of anesthesia    patient states does not take much medicine to put her to sleep   . Edema    ankles to knees   . GERD (gastroesophageal reflux disease)   . Headache(784.0)    hx of migraines   . Hyperlipidemia   . Mitral valve prolapse    no symptoms   . PONV (postoperative nausea and vomiting)   . Scalp alopecia   . Seizures ([place])    1 seizure 45 years ago; unknown etiology and no meds, no seizures since then.    Past Surgical History:  Procedure Laterality Date  . CERVICAL FUSION  2006   cervical 5-6   . COLONOSCOPY N/A 03/03/2017   Procedure: COLONOSCOPY;  Surgeon: Danie Binder, MD;  Location: [facility];  Service: Endoscopy;  Laterality: N/A;  1:15 pm  . HEMORRHOID SURGERY N/A 06/23/2017   Procedure: ANOSCOPY WITH REMOVAL OF SINGLE LESION;  Surgeon: [name], MD;  Location: [facility];  Service: General;  Laterality: N/A;  . MYOCARDIAL PERFUSION  STUDY  07/02/2011   NO SCINTIGRAPHIC EVIDENCE OF INDUCIBLE MYOCARDIAL ISCHEMIA. POST-STRESS EF IS 87%.EXERCISE CAPACITY 9 METS. EKG SHOWS NSR AT 69. THERE IS 1MM HORIZONTAL ST DEPRESSION WITH EXERCISE IN II, III, AVF SEEN BEST AT EARLY RECOVERY. THIS IS PRESENT IN V3-V5 AS WELL AND RETURNS TO BASELINE AT THE END OF THE TEST. NORMAL STUDY.  Marland Kitchen RENAL ARTERY DOPPLER  12/28/2003   CELIAC ARTERY: AT REST, 168.4 CM/S; INSPRIATION, 177.6 CM/S. ARCUATE LIGAMENT COMPRESSION SYNDROME. R & L KIDNEYS: RIGHT KIDNEY MEASURES SOME WHAT SMALLER THAN LEFT. KIDNEY ARE ESSENTIALLY SYMMETRICAL IN SHAPE WITH NO OBVIOUS  ABNORMALITY VISUALIZED. R & L RENAL ARTERIES: DEMONSTRATE NORMAL SPECTRA. NO SUGGESTION OF DIAMETER REDUCTION, DISSECTION, FIBROMUSCULAR DYSPLASIA OR ANY OTHER VASCULAR ABNOR  . TOTAL KNEE ARTHROPLASTY Right 08/31/2013   Procedure: RIGHT TOTAL KNEE ARTHROPLASTY;  Surgeon: [name], MD;  Location: [facility];  Service: Orthopedics;  Laterality: Right;  . TOTAL KNEE ARTHROPLASTY Left 03/07/2015   Procedure: TOTAL KNEE ARTHROPLASTY;  Surgeon: [name], MD;  Location: [facility];  Service: Orthopedics;  Laterality: Left;  . TRANSTHORACIC ECHOCARDIOGRAM  09/06/8339   LV SYSTOLIC FUNCTION NORMAL, EF=>55%, INSIGNIFICANT PERICARDIAL EFFUSION, LEFT ATRIAL SIZE IS NORMAL, RV SYSTOLIC PRESSURE IS NORMAL. NO SIGN VALVULAR HEART DISEASE.  . TUBAL LIGATION  1986  . veins stripping      bilateral legs      Current Outpatient Medications  Medication Sig Dispense Refill  . acetaminophen (TYLENOL) 500 MG tablet Take 500 mg by mouth 2 (two) times daily.     Marland Kitchen amLODipine (NORVASC) 5 MG tablet TAKE 1 TABLET BY MOUTH DAILY 90 tablet 0  . CALCIUM CITRATE PO Take 500 mg by mouth 3 (three) times daily.    . candesartan (ATACAND) 32 MG tablet Take 32 mg by mouth daily.    . Carboxymethylcellulose Sodium (THERATEARS OP) Place 1 drop into both eyes as needed (for dry eyes).    . Cinnamon 500 MG capsule Take 500 mg by mouth daily.    . Coenzyme Q10 200 MG capsule Take 200 mg by mouth daily.     Marland Kitchen ezetimibe (ZETIA) 10 MG tablet TAKE 1 TABLET BY MOUTH ONCE A DAY. 90 tablet 1  . Ferrous Sulfate (IRON) 325 (65 Fe) MG TABS Take 325 mg by mouth every evening.     . fexofenadine (ALLEGRA) 180 MG tablet Take 180 mg by mouth daily.    . Glucosamine-Chondroitin 750-600 MG TABS Take 1 tablet by mouth 2 (two) times daily.     Marland Kitchen ipratropium (ATROVENT) 0.06 % nasal spray Place 2 sprays into both nostrils 2 (two) times daily as needed (for allergies.).     Marland Kitchen magnesium gluconate (MAGONATE) 500 MG tablet Take 500 mg by mouth daily at  12 noon.     . montelukast (SINGULAIR) 10 MG tablet Take 10 mg by mouth at bedtime.    . Multiple Vitamin (MULTIVITAMIN WITH MINERALS) TABS tablet Take 1 tablet by mouth daily. Centrum Silver    . Omega-3 Fatty Acids (FISH OIL) 1200 MG CAPS Take 1,200 mg by mouth 3 (three) times daily.     Marland Kitchen pyridOXINE (VITAMIN B-6) 100 MG tablet Take 100 mg by mouth daily.    . rosuvastatin (CRESTOR) 40 MG tablet TAKE 1 TABLET BY MOUTH AT BEDTIME. 90 tablet 3  . SUMAtriptan (IMITREX) 100 MG tablet Take 100 mg by mouth as needed for migraine or headache. May repeat in 2 hours if headache persists or recurs.     Marland Kitchen  SUMAtriptan Succinate Refill (IMITREX STATDOSE REFILL) 4 MG/0.5ML SOCT Inject 4 mg into the skin as needed (for migraines).    . thiamine (VITAMIN B-1) 100 MG tablet Take 100 mg by mouth daily.     No current facility-administered medications for this visit.     Allergies:   Metoprolol and Sporanox [itraconazole]    Social History:  The patient  reports that she quit smoking about 30 years ago. Her smoking use included cigarettes. She has a 44.00 pack-year smoking history. She has never used smokeless tobacco. She reports current alcohol use. She reports that she does not use drugs.   Family History:  The patient's family history includes Cancer - Other in her brother; Colon cancer in her paternal aunt; Dementia in her mother; Heart attack in her brother and father; Heart disease in her father; Kidney failure in her father.    ROS: All other systems are reviewed and negative. Unless otherwise mentioned in H&P    PHYSICAL EXAM: VS:  There were no vitals taken for this visit. , BMI There is no height or weight on file to calculate BMI. GEN: Well nourished, well developed, in no acute distress HEENT: normal Neck: no JVD, carotid bruits, or masses Cardiac: RRR; no murmurs, rubs, or gallops,no edema  Respiratory:  Clear to auscultation bilaterally, normal work of breathing GI: soft, nontender,  nondistended, + BS MS: no deformity or atrophy Skin: warm and dry, no rash Neuro:  Strength and sensation are intact Psych: euthymic mood, full affect   EKG:  EKG is ordered today. The ekg ordered today demonstrates sinus rhythm with premature atrial complexes 64 bpm.  No acute changes   Recent Labs: No results found for requested labs within last 8760 hours.    Lipid Panel    Component Value Date/Time   CHOL 136 07/01/2017 0932   TRIG 49 07/01/2017 0932   HDL 59 07/01/2017 0932   CHOLHDL 2.3 07/01/2017 0932   CHOLHDL 2.3 12/29/2014 0852   VLDL 11 12/29/2014 0852   LDLCALC 67 07/01/2017 0932      Wt Readings from Last 3 Encounters:  05/25/18 159 lb 3.2 oz (72.2 kg)  02/16/18 153 lb (69.4 kg)  07/08/17 153 lb 6.4 oz (69.6 kg)      Other studies Reviewed: Additional studies/ records that were reviewed today include:  EKG 05/25/2018 Sinus rhythm with premature atrial complexes 65 bpm EKG 02/16/2018 Normal sinus rhythm with sinus arrhythmia 72 bpm  Stress test nuclear medicine 02/17/2018 Low-sodium increased fiber heart healthy diet Increase physical activity as tolerated  Echocardiogram 02/17/2018 Study Conclusions  - Left ventricle: The cavity size was normal. Wall thickness was   normal. Systolic function was normal. The estimated ejection   fraction was in the range of 60% to 65%. Wall motion was normal;   there were no regional wall motion abnormalities. Indeterminate   diastolic function. - Mitral valve: There was trivial regurgitation. - Right atrium: Central venous pressure (est): 3 mm Hg. - Atrial septum: No defect or patent foramen ovale was identified. - Tricuspid valve: There was trivial regurgitation. - Pulmonary arteries: Systolic pressure could not be accurately   estimated. - Pericardium, extracardiac: A prominent pericardial fat pad was   present.     ASSESSMENT AND PLAN:  1.  Essential hypertension- today 138/72.  Well-controlled at  home.  Patient notes some ankle swelling and has reduced her amlodipine dose to 2.5 mg with good results. Decrease amlodipine 2.5 mg tablet daily Continue candesartan 32  mg tablet daily Low-sodium increased fiber heart healthy diet Increase physical activity as tolerated   2.  Chest heaviness-nonexertional and episodic.  Echocardiogram LVEF 02/2018 60 to 65%, tricuspid valve trivial regurgitation, mitral valve trivial regurgitation.  Nuclear stress test 02/2018 apical anterior septal defect, thought to be variable breast attenuation rather than ischemia.  Low risk study EF 67%.  No chest pain or heaviness today Low-sodium increased fiber heart healthy diet Increase physical activity as tolerated   3.  Hyperlipidemia LDL 67 06/2017 Continue Zetia 10 mg tablet daily Continue co-Q10 200 mg tablet daily Continue omega-3 fatty acids 1200 mg tablet 3 times daily Continue rosuvastatin 40 mg tablet daily Low-sodium increased fiber heart healthy diet Increase physical activity as tolerated  4.  CKD stage III -creatinine 1.13 and BUN 22 06/2017 Followed by PCP    Disposition follow-up with Dr. Claiborne Billings in 1 year.  Current medicines are reviewed at length with the patient today.    Labs/ tests ordered today include: None today Phill Myron. [place] Pugh, ANP, AACC   12/27/2018 12:34 PM    [address] Office [phone] Fax [phone]

## 2018-12-28 ENCOUNTER — Encounter: Payer: Self-pay | Admitting: Adult Health

## 2018-12-28 ENCOUNTER — Ambulatory Visit (INDEPENDENT_AMBULATORY_CARE_PROVIDER_SITE_OTHER): Payer: Medicare Other | Admitting: General Practice

## 2018-12-28 ENCOUNTER — Other Ambulatory Visit: Payer: Self-pay

## 2018-12-28 VITALS — BP 138/72 | HR 64 | Ht 65.0 in | Wt 152.0 lb

## 2018-12-28 DIAGNOSIS — R0789 Other chest pain: Secondary | ICD-10-CM | POA: Diagnosis not present

## 2018-12-28 DIAGNOSIS — N183 Chronic kidney disease, stage 3 unspecified: Secondary | ICD-10-CM

## 2018-12-28 DIAGNOSIS — I10 Essential (primary) hypertension: Secondary | ICD-10-CM

## 2018-12-28 DIAGNOSIS — E7800 Pure hypercholesterolemia, unspecified: Secondary | ICD-10-CM | POA: Diagnosis not present

## 2018-12-28 MED ORDER — AMLODIPINE BESYLATE 2.5 MG PO TABS: 2.5000 mg | ORAL_TABLET | Freq: Every day | ORAL | 3 refills | Status: DC

## 2018-12-28 NOTE — Patient Instructions (Signed)
Medication Instructions:  DECREASE- Amlodipine 2.5 mg daily  If you need a refill on your cardiac medications before your next appointment, please call your pharmacy.  Labwork: None Ordered   Testing/Procedures: None Ordered  Follow-Up: You will need a follow up appointment in 1 Year.  Please call our office 2 months in advance to schedule this appointment.  You may see Shelva Majestic, MD or one of the following Advanced Practice Providers on your designated Care Team: [place], [place] . Fabian Sharp, PA-C     At [facility], you and your health needs are our priority.  As part of our continuing mission to provide you with exceptional heart care, we have created designated Provider Care Teams.  These Care Teams include your primary Cardiologist (physician) and Advanced Practice Providers (APPs -  Physician Assistants and Nurse Practitioners) who all work together to provide you with the care you need, when you need it.  Thank you for choosing [facility] at [facility]!!

## 2019-01-28 ENCOUNTER — Other Ambulatory Visit (HOSPITAL_COMMUNITY): Payer: Self-pay | Admitting: Obstetrics and Gynecology

## 2019-01-28 DIAGNOSIS — Z1231 Encounter for screening mammogram for malignant neoplasm of breast: Secondary | ICD-10-CM

## 2019-02-26 ENCOUNTER — Other Ambulatory Visit: Payer: Self-pay | Admitting: Cardiovascular Disease

## 2019-03-19 ENCOUNTER — Other Ambulatory Visit: Payer: Self-pay

## 2019-03-19 ENCOUNTER — Ambulatory Visit (HOSPITAL_COMMUNITY)
Admission: RE | Admit: 2019-03-19 | Discharge: 2019-03-19 | Disposition: A | Discharge status: Home / Self Care | Payer: Medicare Other | Source: Ambulatory Visit | Attending: Obstetrics and Gynecology | Admitting: Obstetrics and Gynecology

## 2019-03-19 DIAGNOSIS — Z1231 Encounter for screening mammogram for malignant neoplasm of breast: Secondary | ICD-10-CM | POA: Diagnosis not present

## 2019-05-31 DIAGNOSIS — R52 Pain, unspecified: Secondary | ICD-10-CM | POA: Diagnosis not present

## 2019-05-31 DIAGNOSIS — G43719 Chronic migraine without aura, intractable, without status migrainosus: Secondary | ICD-10-CM | POA: Diagnosis not present

## 2019-07-27 DIAGNOSIS — H903 Sensorineural hearing loss, bilateral: Secondary | ICD-10-CM | POA: Diagnosis not present

## 2019-07-27 DIAGNOSIS — J310 Chronic rhinitis: Secondary | ICD-10-CM | POA: Diagnosis not present

## 2019-07-27 DIAGNOSIS — J343 Hypertrophy of nasal turbinates: Secondary | ICD-10-CM | POA: Diagnosis not present

## 2019-08-09 DIAGNOSIS — R52 Pain, unspecified: Secondary | ICD-10-CM | POA: Diagnosis not present

## 2019-08-09 DIAGNOSIS — G43719 Chronic migraine without aura, intractable, without status migrainosus: Secondary | ICD-10-CM | POA: Diagnosis not present

## 2019-08-18 ENCOUNTER — Other Ambulatory Visit: Payer: Self-pay | Admitting: Cardiovascular Disease

## 2019-08-24 ENCOUNTER — Other Ambulatory Visit: Payer: Self-pay | Admitting: Cardiovascular Disease

## 2019-09-17 DIAGNOSIS — Z6823 Body mass index (BMI) 23.0-23.9, adult: Secondary | ICD-10-CM | POA: Diagnosis not present

## 2019-09-17 DIAGNOSIS — E7849 Other hyperlipidemia: Secondary | ICD-10-CM | POA: Diagnosis not present

## 2019-09-17 DIAGNOSIS — R0789 Other chest pain: Secondary | ICD-10-CM | POA: Diagnosis not present

## 2019-09-17 DIAGNOSIS — R5383 Other fatigue: Secondary | ICD-10-CM | POA: Diagnosis not present

## 2019-09-17 DIAGNOSIS — Z1389 Encounter for screening for other disorder: Secondary | ICD-10-CM | POA: Diagnosis not present

## 2019-09-17 DIAGNOSIS — M779 Enthesopathy, unspecified: Secondary | ICD-10-CM | POA: Diagnosis not present

## 2019-09-17 DIAGNOSIS — I10 Essential (primary) hypertension: Secondary | ICD-10-CM | POA: Diagnosis not present

## 2019-09-17 DIAGNOSIS — Z0001 Encounter for general adult medical examination with abnormal findings: Secondary | ICD-10-CM | POA: Diagnosis not present

## 2019-09-30 DIAGNOSIS — D225 Melanocytic nevi of trunk: Secondary | ICD-10-CM | POA: Diagnosis not present

## 2019-09-30 DIAGNOSIS — Z1283 Encounter for screening for malignant neoplasm of skin: Secondary | ICD-10-CM | POA: Diagnosis not present

## 2019-09-30 DIAGNOSIS — X32XXXD Exposure to sunlight, subsequent encounter: Secondary | ICD-10-CM | POA: Diagnosis not present

## 2019-09-30 DIAGNOSIS — L298 Other pruritus: Secondary | ICD-10-CM | POA: Diagnosis not present

## 2019-09-30 DIAGNOSIS — D235 Other benign neoplasm of skin of trunk: Secondary | ICD-10-CM | POA: Diagnosis not present

## 2019-09-30 DIAGNOSIS — L570 Actinic keratosis: Secondary | ICD-10-CM | POA: Diagnosis not present

## 2019-09-30 DIAGNOSIS — D2371 Other benign neoplasm of skin of right lower limb, including hip: Secondary | ICD-10-CM | POA: Diagnosis not present

## 2019-10-05 ENCOUNTER — Encounter: Payer: Self-pay | Admitting: Cardiovascular Disease

## 2019-10-05 ENCOUNTER — Ambulatory Visit: Payer: Medicare PPO | Admitting: Cardiovascular Disease

## 2019-10-05 ENCOUNTER — Other Ambulatory Visit: Payer: Self-pay

## 2019-10-05 DIAGNOSIS — E7800 Pure hypercholesterolemia, unspecified: Secondary | ICD-10-CM

## 2019-10-05 DIAGNOSIS — R079 Chest pain, unspecified: Secondary | ICD-10-CM | POA: Diagnosis not present

## 2019-10-05 DIAGNOSIS — Z8249 Family history of ischemic heart disease and other diseases of the circulatory system: Secondary | ICD-10-CM | POA: Diagnosis not present

## 2019-10-05 DIAGNOSIS — Z6823 Body mass index (BMI) 23.0-23.9, adult: Secondary | ICD-10-CM | POA: Diagnosis not present

## 2019-10-05 DIAGNOSIS — Z01812 Encounter for preprocedural laboratory examination: Secondary | ICD-10-CM

## 2019-10-05 DIAGNOSIS — I10 Essential (primary) hypertension: Secondary | ICD-10-CM

## 2019-10-05 DIAGNOSIS — Z1389 Encounter for screening for other disorder: Secondary | ICD-10-CM | POA: Diagnosis not present

## 2019-10-05 DIAGNOSIS — Z0001 Encounter for general adult medical examination with abnormal findings: Secondary | ICD-10-CM | POA: Diagnosis not present

## 2019-10-05 DIAGNOSIS — R072 Precordial pain: Secondary | ICD-10-CM

## 2019-10-05 MED ORDER — METOPROLOL TARTRATE 100 MG PO TABS
ORAL_TABLET | ORAL | 0 refills | Status: DC
Start: 2019-10-05 — End: 2019-11-18

## 2019-10-05 NOTE — Patient Instructions (Signed)
Medication Instructions:  CONTINUE WITH CURRENT MEDICATIONS. NO CHANGES.  *If you need a refill on your cardiac medications before your next appointment, please call your pharmacy*   Lab Work: BMET one week prior to test!  If you have labs (blood work) drawn today and your tests are completely normal, you will receive your results only by: Marland Kitchen MyChart Message (if you have MyChart) OR . A paper copy in the mail If you have any lab test that is abnormal or we need to change your treatment, we will call you to review the results.   Testing/Procedures: Coronary cta- see below   Follow-Up: Post coronary cta   Other Instructions   Your cardiac CT will be scheduled at one of the below locations:   [facility] [address] [place], [place] [address] [phone]   If scheduled at [facility], please arrive at the [facility] main entrance of [facility] 30 minutes prior to test start time. Proceed to the [facility] Radiology Department (first floor) to check-in and test prep.   Please follow these instructions carefully (unless otherwise directed):   On the Night Before the Test: . Be sure to Drink plenty of water. . Do not consume any caffeinated/decaffeinated beverages or chocolate 12 hours prior to your test. . Do not take any antihistamines 12 hours prior to your test. . If you take Metformin do not take 24 hours prior to test.  On the Day of the Test: . Drink plenty of water. Do not drink any water within one hour of the test. . Do not eat any food 4 hours prior to the test. . You may take your regular medications prior to the test.  . Take metoprolol (Lopressor) two hours prior to test. 143m metoprolol tartrate . HOLD Furosemide/Hydrochlorothiazide morning of the test. . FEMALES- please wear underwire-free bra if available   *For Clinical Staff only. Please instruct patient the following:*        -Drink plenty of water        -Hold Furosemide/hydrochlorothiazide morning of the test       -Take metoprolol (Lopressor) 2 hours prior to test (if applicable). 1067mmetoprolol tartrate       After the Test: . Drink plenty of water. . After receiving IV contrast, you may experience a mild flushed feeling. This is normal. . On occasion, you may experience a mild rash up to 24 hours after the test. This is not dangerous. If this occurs, you can take Benadryl 25 mg and increase your fluid intake. . If you experience trouble breathing, this can be serious. If it is severe call 911 IMMEDIATELY. If it is mild, please call our office. . If you take any of these medications: Glipizide/Metformin, Avandament, Glucavance, please do not take 48 hours after completing test unless otherwise instructed.   Once we have confirmed authorization from your insurance company, we will call you to set up a date and time for your test.   For non-scheduling related questions, please contact the cardiac imaging nurse navigator should you have any questions/concerns: SaMarchia BondCardiac Imaging Nurse Navigator MeBurley SaverInterim Cardiac Imaging Nurse NaHaydennd V[facility] Services Direct Office Dial: [phone] For scheduling needs, including cancellations and rescheduling, please call [phone]

## 2019-10-05 NOTE — Progress Notes (Signed)
Patient ID: CASSANDA WALMER, female   DOB: 01-Jun-1950, 69 y.o.   MRN: [id number]     HPI: RAMIYAH MCCLENAHAN is a 69 y.o. female presents to the office for an 55 month cardiology evaluation.  Ms. Mckenna has a long-standing history of migraine headaches,  hypertension, hyperlipidemia, GERD, and has strong family history for both coronary as well as peripheral vascular disease. She also has PVD with lower extremity venous abnormalities. Remotely, she had been on pravastatin for hyperlipidemia but this was switched to Crestor do to inadequate response to pravastatin.   When I saw her over one year ago, she was stable on amlodipine 10 mg, benazepril 10 mg for hypetension as well as her migraine headaches and Crestor 40 mg for hyperlipidemia.   She had developed a rash and stopped taking Toprol-XL 12.5 mg.  She denied any palpitations.  Following its discontinuance or awareness of blood pressure elevation.  Over the past year, she has been bothered by chronic bronchitis and allergic cough.  She also underwent left knee replacement in October 2016 after previously undergoing right knee replacement in April 2015.  She has seen Dr. Gerarda Fraction in July and apparently complete set of blood work was done and she was told that these were within normal limits.  She denies any chest pain.  She is unaware of recurrent palpitations.  She denies presyncope or syncope.  She completed 3 courses of antibiotics.  She will be having a follow-up ENT evaluation later this week.  A chest x-ray on 02/12/2016 did not reveal any active cardiopulmonary disease.   She had developed a dry hacking cough and also was having swelling wall on amlodipine/benazepril combination 10/10.  Her neurologist who takes care of her migraines ultimately switched her to candesartan and she is now on 32 mg daily.  She believes her migraine frequency has increased with the change of medication.  Previously when she was on Lotrel.  She would experience 4  migraines per year and since February 2018 had experienced 15, although less intense than previously.  When I saw her, I added back amlodipine at 2.5 mg.  This has resulted in significant reduction in her migraines  She had undergone blood work by Dr. Gerarda Fraction in 2018.  Her cholesterol was 174 with an LDL of 92 and triglycerides of 67.  When I saw her in October 2018, I added Zetia 10 mg to her rosuvastatin 40 mg in attempt to reach an LDL less than 70.    Subsequent blood work has shown benefit and when I last saw her in February 2019  recent LDL 1 week previously was 67.  HbA1c will A1c was minimally increased at 5.8.  These were normal.  She underwent outpatient surgery last week for resection of a small anal wort  She tolerated this well.    I last saw her in January 2020.  Prior to that evaluation she had been doing  fairly well but follow-up some episodes of fatigability and chest heaviness in October which led to an evaluation by [name], PAC.  An echo Doppler study on February 17, 2018 showed an EF of 60 to 65%.  She had normal wall motion.  Nuclear perfusion study showed a small defect was felt most likely due to breast attenuation rather than ischemia.  He was low risk study.  Nuclear stress ejection fraction was 67%.  Subsequently, she has kept a log of episodes of chest heaviness.  These typically are nonexertional.  She also  has been monitoring her blood pressure which consistently has been stable.  She brought this log with her to the office  which I reviewed.    Since I last saw her, she was evaluated by Coletta Memos, NP in August 2020.  Her blood pressure was 138/72 but further improved at home.  She was having ankle swelling and amlodipine was reduced to 2.5 mg.  She continued to be on candesartan 32 mg.  She continues to be on Zetia, rosuvastatin 40 mg, coenzyme every 10 and omega fatty acids.  Since her last evaluation, at times she does note some chest wall, musculoskeletal rib discomfort  to her chest.  She is able to walk at least 8000 steps daily and denies any exertional symptomatology.  Her cousin had recently died with abnormality to her pleura.  She she has a strong family history for CAD and PAD.  She denies palpitations, presyncope or syncope.  Past Medical History:  Diagnosis Date  . Anemia   . Arthritis   . Chronic kidney disease    idiopathic micro hematuria   . Complication of anesthesia    patient states does not take much medicine to put her to sleep   . Edema    ankles to knees   . GERD (gastroesophageal reflux disease)   . Headache(784.0)    hx of migraines   . Hyperlipidemia   . Mitral valve prolapse    no symptoms   . PONV (postoperative nausea and vomiting)   . Scalp alopecia   . Seizures ([place])    1 seizure 45 years ago; unknown etiology and no meds, no seizures since then.    Past Surgical History:  Procedure Laterality Date  . CERVICAL FUSION  2006   cervical 5-6   . COLONOSCOPY N/A 03/03/2017   Procedure: COLONOSCOPY;  Surgeon: Danie Binder, MD;  Location: [facility];  Service: Endoscopy;  Laterality: N/A;  1:15 pm  . HEMORRHOID SURGERY N/A 06/23/2017   Procedure: ANOSCOPY WITH REMOVAL OF SINGLE LESION;  Surgeon: [name], MD;  Location: [facility];  Service: General;  Laterality: N/A;  . MYOCARDIAL PERFUSION STUDY  07/02/2011   NO SCINTIGRAPHIC EVIDENCE OF INDUCIBLE MYOCARDIAL ISCHEMIA. POST-STRESS EF IS 87%.EXERCISE CAPACITY 9 METS. EKG SHOWS NSR AT 69. THERE IS 1MM HORIZONTAL ST DEPRESSION WITH EXERCISE IN II, III, AVF SEEN BEST AT EARLY RECOVERY. THIS IS PRESENT IN V3-V5 AS WELL AND RETURNS TO BASELINE AT THE END OF THE TEST. NORMAL STUDY.  Marland Kitchen RENAL ARTERY DOPPLER  12/28/2003   CELIAC ARTERY: AT REST, 168.4 CM/S; INSPRIATION, 177.6 CM/S. ARCUATE LIGAMENT COMPRESSION SYNDROME. R & L KIDNEYS: RIGHT KIDNEY MEASURES SOME WHAT SMALLER THAN LEFT. KIDNEY ARE ESSENTIALLY SYMMETRICAL IN SHAPE WITH NO OBVIOUS ABNORMALITY VISUALIZED. R & L RENAL  ARTERIES: DEMONSTRATE NORMAL SPECTRA. NO SUGGESTION OF DIAMETER REDUCTION, DISSECTION, FIBROMUSCULAR DYSPLASIA OR ANY OTHER VASCULAR ABNOR  . TOTAL KNEE ARTHROPLASTY Right 08/31/2013   Procedure: RIGHT TOTAL KNEE ARTHROPLASTY;  Surgeon: [name], MD;  Location: [facility];  Service: Orthopedics;  Laterality: Right;  . TOTAL KNEE ARTHROPLASTY Left 03/07/2015   Procedure: TOTAL KNEE ARTHROPLASTY;  Surgeon: [name], MD;  Location: [facility];  Service: Orthopedics;  Laterality: Left;  . TRANSTHORACIC ECHOCARDIOGRAM  0/35/5974   LV SYSTOLIC FUNCTION NORMAL, EF=>55%, INSIGNIFICANT PERICARDIAL EFFUSION, LEFT ATRIAL SIZE IS NORMAL, RV SYSTOLIC PRESSURE IS NORMAL. NO SIGN VALVULAR HEART DISEASE.  . TUBAL LIGATION  1986  . veins stripping      bilateral legs  Allergies  Allergen Reactions  . Metoprolol Rash  . Sporanox [Itraconazole] Swelling and Other (See Comments)    Bilateral knee and ankle swelling and pain     Current Outpatient Medications  Medication Sig Dispense Refill  . acetaminophen (TYLENOL) 500 MG tablet     . amLODipine (NORVASC) 2.5 MG tablet Take 1 tablet (2.5 mg total) by mouth daily. 90 tablet 3  . CALCIUM CITRATE PO Take 500 mg by mouth 3 (three) times daily.    . candesartan (ATACAND) 32 MG tablet Take 32 mg by mouth daily.    . Cinnamon 500 MG capsule Take 500 mg by mouth daily.    . Coenzyme Q10 200 MG capsule Take 200 mg by mouth daily.     Marland Kitchen EMGALITY 120 MG/ML SOAJ INJECT ONE PEN MONTHLY ASTDIRECTED.RYO    . ezetimibe (ZETIA) 10 MG tablet TAKE 1 TABLET BY MOUTH ONCE A DAY. 90 tablet 0  . Ferrous Sulfate (IRON) 325 (65 Fe) MG TABS Take 325 mg by mouth every evening.     . fexofenadine (ALLEGRA) 180 MG tablet Take 180 mg by mouth daily.    . Glucosamine-Chondroitin 750-600 MG TABS Take 1 tablet by mouth 2 (two) times daily.     Marland Kitchen ipratropium (ATROVENT) 0.06 % nasal spray Place 2 sprays into both nostrils 2 (two) times daily as needed (for allergies.).     Marland Kitchen  magnesium gluconate (MAGONATE) 500 MG tablet Take 500 mg by mouth daily at 12 noon.     . montelukast (SINGULAIR) 10 MG tablet Take 10 mg by mouth at bedtime.    . Multiple Vitamin (MULTIVITAMIN WITH MINERALS) TABS tablet Take 1 tablet by mouth daily. Centrum Silver    . Omega-3 Fatty Acids (FISH OIL) 1200 MG CAPS Take 1,200 mg by mouth 3 (three) times daily.     Marland Kitchen pyridOXINE (VITAMIN B-6) 100 MG tablet Take 100 mg by mouth daily.    . rosuvastatin (CRESTOR) 40 MG tablet TAKE 1 TABLET BY MOUTH AT BEDTIME. 90 tablet 1  . SUMAtriptan Succinate Refill (IMITREX STATDOSE REFILL) 4 MG/0.5ML SOCT Inject 4 mg into the skin as needed (for migraines).    . thiamine (VITAMIN B-1) 100 MG tablet Take 100 mg by mouth daily.    . metoprolol tartrate (LOPRESSOR) 100 MG tablet TAKE 2 HOURS PRIOR TO YOUR CORONARY CTA 1 tablet 0   No current facility-administered medications for this visit.    Social History   Socioeconomic History  . Marital status: Widowed    Spouse name: Not on file  . Number of children: Not on file  . Years of education: Not on file  . Highest education level: Not on file  Occupational History  . Not on file  Tobacco Use  . Smoking status: Former Smoker    Packs/day: 2.00    Years: 22.00    Pack years: 44.00    Types: Cigarettes    Quit date: 05/13/1988    Years since quitting: 31.4  . Smokeless tobacco: Never Used  Substance and Sexual Activity  . Alcohol use: Yes    Comment: wine- rarely.  . Drug use: No  . Sexual activity: Not Currently    Birth control/protection: Post-menopausal  Other Topics Concern  . Not on file  Social History Narrative  . Not on file   Social Determinants of Health   Financial Resource Strain:   . Difficulty of Paying Living Expenses:   Food Insecurity:   . Worried About Estate manager/land agent  of Food in the Last Year:   . [place] in the Last Year:   Transportation Needs:   . Lack of Transportation (Medical):   Marland Kitchen Lack of Transportation  (Non-Medical):   Physical Activity:   . Days of Exercise per Week:   . Minutes of Exercise per Session:   Stress:   . Feeling of Stress :   Social Connections:   . Frequency of Communication with Friends and Family:   . Frequency of Social Gatherings with Friends and Family:   . Attends Religious Services:   . Active Member of Clubs or Organizations:   . Attends Archivist Meetings:   Marland Kitchen Marital Status:   Intimate Partner Violence:   . Fear of Current or Ex-Partner:   . Emotionally Abused:   Marland Kitchen Physically Abused:   . Sexually Abused:    Social history is notable that she is married and has remained active. She does water aerobics and also rides a bicycle. She does not smoke, but there is a remote history of tobacco use having quit approximately 27 years ago. She has 2 children. She is the sister-in-law of Dr. Adella Hare and is married to Dr. Daylene Posey wife's brother.  She is retired Chief Technology Officer and retired at age 42.  Family History  Problem Relation Age of Onset  . Dementia Mother   . Heart attack Father   . Heart disease Father   . Kidney failure Father   . Heart attack Brother        age 10  . Cancer - Other Brother        kidney  . Colon cancer Paternal Aunt    ROS General: Negative; No fevers, chills, or night sweats;  HEENT: Negative; No changes in vision or hearing, sinus congestion, difficulty swallowing Pulmonary: Negative; No cough, wheezing, shortness of breath, hemoptysis Cardiovascular: See history of present illness GI: Negative; No nausea, vomiting, diarrhea, or abdominal pain GU: Colonoscopy October 2018, status post outpatient surgery for an small anal wart Musculoskeletal: Negative; no myalgias, joint pain, or weakness Hematologic/Oncology: Negative; no easy bruising, bleeding Endocrine: Negative; no heat/cold intolerance; no diabetes Neuro: Positive for history of migraines, currently approximately 3-4 per year Skin: Negative; No  rashes or skin lesions Psychiatric: Negative; No behavioral problems, depression Sleep: Negative; No snoring, daytime sleepiness, hypersomnolence, bruxism, restless legs, hypnogognic hallucinations, no cataplexy Other comprehensive 14 point system review is negative.   PE BP 120/68   Pulse 67   Temp (!) 97 F (36.1 C)   Ht '5\' 5"'  (1.651 m)   Wt 144 lb (65.3 kg)   SpO2 91%   BMI 23.96 kg/m    Repeat blood pressure by me was 120/64  Wt Readings from Last 3 Encounters:  10/05/19 144 lb (65.3 kg)  12/28/18 152 lb (68.9 kg)  05/25/18 159 lb 3.2 oz (72.2 kg)   General: Alert, oriented, no distress.  Skin: normal turgor, no rashes, warm and dry HEENT: Normocephalic, atraumatic. Pupils equal round and reactive to light; sclera anicteric; extraocular muscles intact;  Nose without nasal septal hypertrophy Mouth/Parynx benign; Mallinpatti scale 2 Neck: No JVD, no carotid bruits; normal carotid upstroke Lungs: clear to ausculatation and percussion; no wheezing or rales Chest wall: without tenderness to palpitation Heart: PMI not displaced, RRR, s1 s2 normal, 1/6 systolic murmur, no diastolic murmur, no rubs, gallops, thrills, or heaves Abdomen: soft, nontender; no hepatosplenomehaly, BS+; abdominal aorta nontender and not dilated by palpation. Back: no CVA tenderness  Pulses 2+ Musculoskeletal: full range of motion, normal strength, no joint deformities Extremities: no clubbing cyanosis or edema, Homan's sign negative  Neurologic: grossly nonfocal; Cranial nerves grossly wnl Psychologic: Normal mood and affect   ECG (independently read by me): Normal sinus rhythm with mild sinus arrhythmia at 67 bpm.  Normal intervals.  No ectopy no ST segment changes  January 2020 ECG (independently read by me): Sinus rhythm with PACs with ventricular rate of 65 bpm.  No ectopy.  February 2019 ECG (independently read by me): Normal sinus rhythm with PAC.  PR interval 172 ms.  Nonspecific ST changes.   QTc interval 379 ms.  October 2018 ECG (independently read by me): Normal sinus rhythm at 69 bpm.  Nonspecific ST changes.  PR interval 156 more seconds, QTc interval 390 ms.  October 2017 ECG (independently read by me): Normal sinus rhythm at 64 bpm.  July 2016 ECG (independently read by me):  Normal sinus rhythm at 72 bpm. Borderline left atrial enlargement.  January 2016 ECG (independently read by me): Normal sinus rhythm at 69 bpm.  Normal intervals.  No significant ST segment changes.  January 2015 ECG (independently read by me): Normal sinus rhythm with nonspecific ST-T changes. Heart rate 75 beats per minute.  LABS:  I personally reviewed the repeat laboratory done on 01/28/2017 by Dr. Gerarda Fraction.  Lipid studies from February 04, 2018 reveal a total cholesterol 132, HDL 55, LDL 60, triglycerides 87.     BMP Latest Ref Rng & Units 07/01/2017 06/19/2017 03/09/2015  Glucose 65 - 99 mg/dL 93 123(H) 143(H)  BUN 8 - 27 mg/dL 22 27(H) 21(H)  Creatinine 0.57 - 1.00 mg/dL 1.13(H) 1.33(H) 0.86  BUN/Creat Ratio 12 - 28 19 - -  Sodium 134 - 144 mmol/L 141 135 138  Potassium 3.5 - 5.2 mmol/L 4.8 4.7 4.3  Chloride 96 - 106 mmol/L 107(H) 102 107  CO2 20 - 29 mmol/L '20 23 26  ' Calcium 8.7 - 10.3 mg/dL 9.7 9.4 8.2(L)   Hepatic Function Latest Ref Rng & Units 07/01/2017 12/29/2014 05/10/2014  Total Protein 6.0 - 8.5 g/dL 6.9 6.8 6.9  Albumin 3.6 - 4.8 g/dL 4.3 3.9 4.1  AST 0 - 40 IU/L '23 19 19  ' ALT 0 - 32 IU/L '16 17 17  ' Alk Phosphatase 39 - 117 IU/L 63 73 69  Total Bilirubin 0.0 - 1.2 mg/dL 0.4 0.4 0.4   CBC Latest Ref Rng & Units 06/19/2017 03/03/2017 03/09/2015  WBC 4.0 - 10.5 K/uL 5.6 4.5 10.2  Hemoglobin 12.0 - 15.0 g/dL 11.8(L) 10.9(L) 9.0(L)  Hematocrit 36.0 - 46.0 % 37.0 33.4(L) 28.0(L)  Platelets 150 - 400 K/uL 234 221 188   Lab Results  Component Value Date   MCV 91.6 06/19/2017   MCV 91.5 03/03/2017   MCV 85.9 03/09/2015   Lab Results  Component Value Date   TSH 2.014  12/29/2014   Lipid Panel     Component Value Date/Time   CHOL 136 07/01/2017 0932   TRIG 49 07/01/2017 0932   HDL 59 07/01/2017 0932   CHOLHDL 2.3 07/01/2017 0932   CHOLHDL 2.3 12/29/2014 0852   VLDL 11 12/29/2014 0852   LDLCALC 67 07/01/2017 0932     RADIOLOGY: No results found.  IMPRESSION:  1. Precordial pain   2. Essential hypertension   3. Pure hypercholesterolemia   4. Pre-procedure lab exam   5. Family history of heart disease     ASSESSMENT AND PLAN: Ms. Hrivnak is 69 year-old female who  has a long-standing history of hypertension as well as hyperlipidemia.  She had developed a nonproductive cough on Lotrel, which led to discontinuance of ACE inhibition.  Her blood pressure today is controlled on her regimen consisting of amlodipine 2.5 mg daily and candesartan 32 mg daily.  She continues to be on rosuvastatin 40 mg and Zetia 10 mg for hyperlipidemia.  Recently she has noticed episodes of chest discomfort.  However, she feels that this discomfort is mostly in her ribs.  She does not have exertional symptomatology and averages at least 8000 steps of walking per day without symptoms.  I suspect her chest pain is musculoskeletal in etiology.  She is concerned about coronary disease with her strong  Family history and recently a cousin just died.  I have recommended she undergo coronary CTA for assessment of coronary obstructive disease which hopefully will not demonstrate significant abnormality but will be helpful in delaying some of her anxiety particularly with her recurrent probable atypical chest pain symptoms.  She tells me she had laboratory done today.  I will try to obtain these results.  She is on Singulair.  She has history of migraine headaches on treatment.   Troy Sine, MD, [facility]  10/11/2019 10:20 AM

## 2019-10-07 ENCOUNTER — Other Ambulatory Visit (HOSPITAL_COMMUNITY): Payer: Self-pay | Admitting: Internal Medicine

## 2019-10-07 DIAGNOSIS — E2839 Other primary ovarian failure: Secondary | ICD-10-CM

## 2019-10-11 ENCOUNTER — Encounter: Payer: Self-pay | Admitting: Cardiovascular Disease

## 2019-10-19 DIAGNOSIS — Z6823 Body mass index (BMI) 23.0-23.9, adult: Secondary | ICD-10-CM | POA: Diagnosis not present

## 2019-10-19 DIAGNOSIS — Z01419 Encounter for gynecological examination (general) (routine) without abnormal findings: Secondary | ICD-10-CM | POA: Diagnosis not present

## 2019-10-21 ENCOUNTER — Other Ambulatory Visit (HOSPITAL_COMMUNITY): Payer: Medicare PPO

## 2019-10-21 DIAGNOSIS — H5213 Myopia, bilateral: Secondary | ICD-10-CM | POA: Diagnosis not present

## 2019-10-22 ENCOUNTER — Ambulatory Visit (HOSPITAL_COMMUNITY)
Admission: RE | Admit: 2019-10-22 | Discharge: 2019-10-22 | Disposition: A | Discharge status: Home / Self Care | Payer: Medicare PPO | Source: Ambulatory Visit | Attending: Internal Medicine | Admitting: Internal Medicine

## 2019-10-22 ENCOUNTER — Other Ambulatory Visit: Payer: Self-pay

## 2019-10-22 DIAGNOSIS — E2839 Other primary ovarian failure: Secondary | ICD-10-CM | POA: Insufficient documentation

## 2019-10-22 DIAGNOSIS — M85851 Other specified disorders of bone density and structure, right thigh: Secondary | ICD-10-CM | POA: Diagnosis not present

## 2019-11-04 DIAGNOSIS — Z01812 Encounter for preprocedural laboratory examination: Secondary | ICD-10-CM | POA: Diagnosis not present

## 2019-11-04 DIAGNOSIS — E7800 Pure hypercholesterolemia, unspecified: Secondary | ICD-10-CM | POA: Diagnosis not present

## 2019-11-04 DIAGNOSIS — I10 Essential (primary) hypertension: Secondary | ICD-10-CM | POA: Diagnosis not present

## 2019-11-04 DIAGNOSIS — R072 Precordial pain: Secondary | ICD-10-CM | POA: Diagnosis not present

## 2019-11-04 DIAGNOSIS — R0789 Other chest pain: Secondary | ICD-10-CM | POA: Diagnosis not present

## 2019-11-05 LAB — BASIC METABOLIC PANEL
BUN/Creatinine Ratio: 18 (ref 12–28)
BUN: 22 mg/dL (ref 8–27)
CO2: 22 mmol/L (ref 20–29)
Calcium: 9.3 mg/dL (ref 8.7–10.3)
Chloride: 107 mmol/L — ABNORMAL HIGH (ref 96–106)
Creatinine, Ser: 1.21 mg/dL — ABNORMAL HIGH (ref 0.57–1.00)
GFR calc Af Amer: 52 mL/min/{1.73_m2} — ABNORMAL LOW (ref >59)
GFR calc non Af Amer: 45 mL/min/{1.73_m2} — ABNORMAL LOW (ref >59)
Glucose: 100 mg/dL — ABNORMAL HIGH (ref 65–99)
Potassium: 4.5 mmol/L (ref 3.5–5.2)
Sodium: 141 mmol/L (ref 134–144)

## 2019-11-08 ENCOUNTER — Telehealth (HOSPITAL_COMMUNITY): Payer: Self-pay | Admitting: *Deleted

## 2019-11-08 NOTE — Telephone Encounter (Signed)
Pt returning call regarding upcoming cardiac imaging study; pt verbalizes understanding of appt date/time, parking situation and where to check in, pre-test NPO status and medications ordered, and verified current allergies; name and call back number provided for further questions should they arise  Handy Mcloud Tai RN Navigator Cardiac Imaging [facility] [phone] office [phone] cell  

## 2019-11-08 NOTE — Telephone Encounter (Signed)
Attempted to call patient regarding upcoming cardiac CT appointment. Left message on voicemail with name and callback number  Cathalina Barcia Tai RN Navigator Cardiac Imaging [facility] [phone] Office [phone] Cell  

## 2019-11-09 ENCOUNTER — Ambulatory Visit (HOSPITAL_COMMUNITY)
Admission: RE | Admit: 2019-11-09 | Discharge: 2019-11-09 | Disposition: A | Discharge status: Home / Self Care | Payer: Medicare PPO | Source: Ambulatory Visit | Attending: Cardiovascular Disease | Admitting: Cardiovascular Disease

## 2019-11-09 DIAGNOSIS — R072 Precordial pain: Secondary | ICD-10-CM | POA: Diagnosis not present

## 2019-11-09 MED ORDER — NITROGLYCERIN 0.4 MG SL SUBL
SUBLINGUAL_TABLET | SUBLINGUAL | Status: AC
  Filled 2019-11-09: qty 2

## 2019-11-09 MED ORDER — IOHEXOL 350 MG/ML SOLN
80.0000 mL | Freq: Once | INTRAVENOUS | Status: AC | PRN
  Administered 2019-11-09: 80 mL via INTRAVENOUS

## 2019-11-09 MED ORDER — NITROGLYCERIN 0.4 MG SL SUBL
0.8000 mg | SUBLINGUAL_TABLET | Freq: Once | SUBLINGUAL | Status: AC
  Administered 2019-11-09: 0.8 mg via SUBLINGUAL

## 2019-11-17 NOTE — Progress Notes (Signed)
Cardiology Clinic Note   Patient Name: Mary [name] Date of Encounter: 11/18/2019  Primary Care Provider:  Redmond School, MD Primary Cardiologist:  Shelva Majestic, MD  Patient Profile    Mary [name] 69 year old female presents today for a follow-up of her cardiac CT.  Past Medical History    Past Medical History:  Diagnosis Date   Anemia    Arthritis    Chronic kidney disease    idiopathic micro hematuria    Complication of anesthesia    patient states does not take much medicine to put her to sleep    Edema    ankles to knees    GERD (gastroesophageal reflux disease)    Headache(784.0)    hx of migraines    Hyperlipidemia    Mitral valve prolapse    no symptoms    PONV (postoperative nausea and vomiting)    Scalp alopecia    Seizures (HCC)    1 seizure 45 years ago; unknown etiology and no meds, no seizures since then.   Past Surgical History:  Procedure Laterality Date   CERVICAL FUSION  2006   cervical 5-6    COLONOSCOPY N/A 03/03/2017   Procedure: COLONOSCOPY;  Surgeon: Danie Binder, MD;  Location: [facility];  Service: Endoscopy;  Laterality: N/A;  1:15 pm   HEMORRHOID SURGERY N/A 06/23/2017   Procedure: ANOSCOPY WITH REMOVAL OF SINGLE LESION;  Surgeon: [name], MD;  Location: [facility];  Service: General;  Laterality: N/A;   MYOCARDIAL PERFUSION STUDY  07/02/2011   NO SCINTIGRAPHIC EVIDENCE OF INDUCIBLE MYOCARDIAL ISCHEMIA. POST-STRESS EF IS 87%.EXERCISE CAPACITY 9 METS. EKG SHOWS NSR AT 69. THERE IS 1MM HORIZONTAL ST DEPRESSION WITH EXERCISE IN II, III, AVF SEEN BEST AT EARLY RECOVERY. THIS IS PRESENT IN V3-V5 AS WELL AND RETURNS TO BASELINE AT THE END OF THE TEST. NORMAL STUDY.   RENAL ARTERY DOPPLER  12/28/2003   CELIAC ARTERY: AT REST, 168.4 CM/S; INSPRIATION, 177.6 CM/S. ARCUATE LIGAMENT COMPRESSION SYNDROME. R & L KIDNEYS: RIGHT KIDNEY MEASURES SOME WHAT SMALLER THAN LEFT. KIDNEY ARE ESSENTIALLY SYMMETRICAL IN SHAPE WITH NO  OBVIOUS ABNORMALITY VISUALIZED. R & L RENAL ARTERIES: DEMONSTRATE NORMAL SPECTRA. NO SUGGESTION OF DIAMETER REDUCTION, DISSECTION, FIBROMUSCULAR DYSPLASIA OR ANY OTHER VASCULAR ABNOR   TOTAL KNEE ARTHROPLASTY Right 08/31/2013   Procedure: RIGHT TOTAL KNEE ARTHROPLASTY;  Surgeon: [name], MD;  Location: [facility];  Service: Orthopedics;  Laterality: Right;   TOTAL KNEE ARTHROPLASTY Left 03/07/2015   Procedure: TOTAL KNEE ARTHROPLASTY;  Surgeon: [name], MD;  Location: [facility];  Service: Orthopedics;  Laterality: Left;   TRANSTHORACIC ECHOCARDIOGRAM  1/65/5374   LV SYSTOLIC FUNCTION NORMAL, EF=>55%, INSIGNIFICANT PERICARDIAL EFFUSION, LEFT ATRIAL SIZE IS NORMAL, RV SYSTOLIC PRESSURE IS NORMAL. NO SIGN VALVULAR HEART DISEASE.   TUBAL LIGATION  1986   veins stripping      bilateral legs     Allergies  Allergies  Allergen Reactions   Metoprolol Rash   Sporanox [Itraconazole] Swelling and Other (See Comments)    Bilateral knee and ankle swelling and pain     History of Present Illness    Mary [name] is a 69 y.o. female who presents for ongoing assessment and management of hypertension, hyperlipidemia, with other history to include GERD, chronic migraines Stage II chronic kidney disease, and strong family history for CAD and PAD.  Echocardiogram in October 2019 revealed an EF of 60% to 65% with normal wall motion.  She had a nuclear medicine  perfusion study revealing a small defect that was felt most likely due to breast attenuation rather than ischemia and found to be a low risk study.  She was last seen by Dr. Claiborne Billings on 05/25/2018.  It was noted that she was unable to tolerate amlodipine benazepril combination due to dry hacking cough and lower extremity edema.  She was switched to candesartan 32 mg daily, and amlodipine 2.5 mg daily.  The patient has easy fatigability with complaint of chest heaviness with exertion.  On that visit, blood pressure was noted to be well  controlled with amlodipine and candesartan.  However she was complaining of some chest heaviness and therefore amlodipine was increased to 5 mg daily.  She was also suggested a trial of OTC and antacid.  She was continued on rosuvastatin and Zetia for hyperlipidemia.  At the time her LDL was 60.    She presented to the clinic 8/20 and stated she had decreased her amlodipine to 2.5 mg and noticed less swelling.  Her blood pressure continued to be well managed.  She indicated her systolic blood pressures been running in the 100s to 110s over the 60s to 70s.  Her blood pressure was 138/72.  She continued to be active maintaining 1 of the local walking trails and is active for 30 minutes or more 7 days/week.  She was also started water aerobics again 3 times per week.  She enjoyed traveling but was concerned about the coronavirus and has decided to discontinue her travel at this time until a vaccine becomes available.  She presents to the clinic today and states that she has been having continued intermittent chest wall pain.  We discussed following up with neurology for evaluation of nerve pain.  Her coronary CTA was reviewed and we discussed her normal coronary arteries and atherosclerotic aortic plaque.  We discussed statin therapy, lab results, and her physical activity.  She continues to be very physically active doing water aerobics 3 times a week for 1 hour.  I will have her follow-up with Dr. Claiborne Billings in 1 year.  I will also give her the salty 6 diet handout.  Today she denies chest pain, shortness of breath, lower extremity edema, fatigue, palpitations, melena, hematuria, hemoptysis, diaphoresis, weakness, presyncope, syncope, orthopnea, and PND.   Home Medications    Prior to Admission medications   Medication Sig Start Date End Date Taking? Authorizing Provider  acetaminophen (TYLENOL) 500 MG tablet  07/21/18   [provider]  amLODipine (NORVASC) 2.5 MG tablet Take 1 tablet (2.5 mg  total) by mouth daily. 12/28/18   Deberah Pelton, NP  CALCIUM CITRATE PO Take 500 mg by mouth 3 (three) times daily.    [provider]  candesartan (ATACAND) 32 MG tablet Take 32 mg by mouth daily.    [provider]  Cinnamon 500 MG capsule Take 500 mg by mouth daily.    [provider]  Coenzyme Q10 200 MG capsule Take 200 mg by mouth daily.     [provider]  EMGALITY 120 MG/ML SOAJ INJECT ONE PEN MONTHLY ASTDIRECTED.RYO 09/09/19   [provider]  ezetimibe (ZETIA) 10 MG tablet TAKE 1 TABLET BY MOUTH ONCE A DAY. 08/24/19   Troy Sine, MD  Ferrous Sulfate (IRON) 325 (65 Fe) MG TABS Take 325 mg by mouth every evening.     [provider]  fexofenadine (ALLEGRA) 180 MG tablet Take 180 mg by mouth daily.    [provider]  Glucosamine-Chondroitin 750-600 MG TABS Take 1 tablet by mouth 2 (two) times daily.     [provider]  ipratropium (ATROVENT) 0.06 % nasal spray Place 2 sprays into both nostrils 2 (two) times daily as needed (for allergies.).     [provider]  magnesium gluconate (MAGONATE) 500 MG tablet Take 500 mg by mouth daily at 12 noon.     [provider]  metoprolol tartrate (LOPRESSOR) 100 MG tablet TAKE 2 HOURS PRIOR TO YOUR CORONARY CTA 10/05/19   Troy Sine, MD  montelukast (SINGULAIR) 10 MG tablet Take 10 mg by mouth at bedtime.    [provider]  Multiple Vitamin (MULTIVITAMIN WITH MINERALS) TABS tablet Take 1 tablet by mouth daily. Centrum Silver    [provider]  Omega-3 Fatty Acids (FISH OIL) 1200 MG CAPS Take 1,200 mg by mouth 3 (three) times daily.     [provider]  pyridOXINE (VITAMIN B-6) 100 MG tablet Take 100 mg by mouth daily.    [provider]  rosuvastatin (CRESTOR) 40 MG tablet TAKE 1 TABLET BY MOUTH AT BEDTIME. 08/19/19   Troy Sine, MD  SUMAtriptan Succinate Refill (IMITREX STATDOSE REFILL) 4 MG/0.5ML SOCT Inject 4  mg into the skin as needed (for migraines).    [provider]  thiamine (VITAMIN B-1) 100 MG tablet Take 100 mg by mouth daily.    [provider]    Family History    Family History  Problem Relation Age of Onset   Dementia Mother    Heart attack Father    Heart disease Father    Kidney failure Father    Heart attack Brother        age 13   Cancer - Other Brother        kidney   Colon cancer Paternal Aunt    She indicated that her mother is deceased. She indicated that her father is deceased. She indicated that both of her brothers are alive. She indicated that the status of her paternal aunt is unknown.  Social History    Social History   Socioeconomic History   Marital status: Widowed    Spouse name: Not on file   Number of children: Not on file   Years of education: Not on file   Highest education level: Not on file  Occupational History   Not on file  Tobacco Use   Smoking status: Former Smoker    Packs/day: 2.00    Years: 22.00    Pack years: 44.00    Types: Cigarettes    Quit date: 05/13/1988    Years since quitting: 31.5   Smokeless tobacco: Never Used  Vaping Use   Vaping Use: Never used  Substance and Sexual Activity   Alcohol use: Yes    Comment: wine- rarely.   Drug use: No   Sexual activity: Not Currently    Birth control/protection: Post-menopausal  Other Topics Concern   Not on file  Social History Narrative   Not on file   Social Determinants of Health   Financial Resource Strain:    Difficulty of Paying Living Expenses:   Food Insecurity:    Worried About Charity fundraiser in the Last Year:    Arboriculturist in the Last Year:   Transportation Needs:    Lack of Transportation (Medical):    Lack of Transportation (Non-Medical):   Physical Activity:    Days of Exercise per Week:  Minutes of Exercise per Session:   Stress:    Feeling of Stress :   Social Connections:    Frequency of  Communication with Friends and Family:    Frequency of Social Gatherings with Friends and Family:    Attends Religious Services:    Active Member of Clubs or Organizations:    Attends Music therapist:    Marital Status:   Intimate Partner Violence:    Fear of Current or Ex-Partner:    Emotionally Abused:    Physically Abused:    Sexually Abused:      Review of Systems    General:  No chills, fever, night sweats or weight changes.  Cardiovascular:  No chest pain, dyspnea on exertion, edema, orthopnea, palpitations, paroxysmal nocturnal dyspnea. Dermatological: No rash, lesions/masses Respiratory: No cough, dyspnea Urologic: No hematuria, dysuria Abdominal:   No nausea, vomiting, diarrhea, bright red blood per rectum, melena, or hematemesis Neurologic:  No visual changes, wkns, changes in mental status. All other systems reviewed and are otherwise negative except as noted above.  Physical Exam    VS:  BP 128/76    Pulse 67    Ht 5\' 5"  (1.651 m)    Wt 143 lb (64.9 kg)    SpO2 97%    BMI 23.80 kg/m  , BMI Body mass index is 23.8 kg/m. GEN: Well nourished, well developed, in no acute distress. HEENT: normal. Neck: Supple, no JVD, carotid bruits, or masses. Cardiac: RRR, no murmurs, rubs, or gallops. No clubbing, cyanosis, edema.  Radials/DP/PT 2+ and equal bilaterally.  Respiratory:  Respirations regular and unlabored, clear to auscultation bilaterally. GI: Soft, nontender, nondistended, BS + x 4. MS: no deformity or atrophy. Skin: warm and dry, no rash. Neuro:  Strength and sensation are intact. Psych: Normal affect.  Accessory Clinical Findings    ECG personally reviewed by me today-none today.  EKG 12/2018 Sinus rhythm with premature atrial complexes 64 bpm  EKG 05/25/2018 Sinus rhythm with premature atrial complexes 65 bpm EKG 02/16/2018 Normal sinus rhythm with sinus arrhythmia 72 bpm  Stress test nuclear medicine 02/17/2018 Low-sodium  increased fiber heart healthy diet Increase physical activity as tolerated  Echocardiogram 02/17/2018 Study Conclusions  - Left ventricle: The cavity size was normal. Wall thickness was normal. Systolic function was normal. The estimated ejection fraction was in the range of 60% to 65%. Wall motion was normal; there were no regional wall motion abnormalities. Indeterminate diastolic function. - Mitral valve: There was trivial regurgitation. - Right atrium: Central venous pressure (est): 3 mm Hg. - Atrial septum: No defect or patent foramen ovale was identified. - Tricuspid valve: There was trivial regurgitation. - Pulmonary arteries: Systolic pressure could not be accurately estimated. - Pericardium, extracardiac: A prominent pericardial fat pad was present.  CT coronary 11/09/2019 FINDINGS: A 120 kV prospective scan was triggered in the descending thoracic aorta at 111 HU's. Axial non-contrast 3 mm slices were carried out through the heart. The data set was analyzed on a dedicated work station and scored using the [place]. Gantry rotation speed was 250 msecs and collimation was .6 mm. No beta blockade and 0.8 mg of sl NTG was given. The 3D data set was reconstructed in 5% intervals of the 67-82 % of the R-R cycle. Diastolic phases were analyzed on a dedicated work station using MPR, MIP and VRT modes. The patient received 80 cc of contrast.  Aorta: Normal size. Scattered calcifications in the ascending and descending aorta. No dissection.  Aortic  Valve:  Trileaflet.  No calcifications.  Coronary Arteries:  Normal coronary origin.  Right dominance.  RCA is a large dominant artery that gives rise to PDA and PLVB. There is no calcified plaque. There is significant motion artifact throughout the RCA that limits ability to detect non-calcified plaque but no obvious plaque is noted after review of all cardiac phases.  Left main is a large artery that  gives rise to LAD and LCX arteries. There is no plaque.  LAD is a large vessel that gives rise to 2 large septal perforators and then a small D1. There is no plaque.  LCX is a non-dominant artery that immediately gives rise to a very small OM1 and then a large OM2 branch. There is no plaque.  Other findings:  Normal pulmonary vein drainage into the left atrium.  Normal let atrial appendage without a thrombus.  Normal size of the pulmonary artery.  IMPRESSION: 1. Coronary calcium score of 0. This was 0 percentile for age and sex matched control.  2. Normal coronary origin with right dominance.  3. No evidence of CAD.  CAD-RADS=0.  4. Consider non-atheroslcerotic causes of chest pain.  5. The RCA is limited by significant motion artifact but no obvious plaque noted. Could consider Lexiscan myoview to assess RCA territory.  Assessment & Plan   1.  Essential hypertension- today  128/76.  Well-controlled at home.  Patient previously noted  ankle swelling with amlodipine.  Continue amlodipine 2.5 mg tablet daily Continue candesartan 32 mg tablet daily Low-sodium increased fiber heart healthy diet Increase physical activity as tolerated   2.  Chest heaviness-none today.  Continues to have occasional intermittent chest heaviness.  Discussed following up with neurology for further evaluation. Echocardiogram LVEF 02/2018 60 to 65%, tricuspid valve trivial regurgitation, mitral valve trivial regurgitation.  Nuclear stress test 02/2018 apical anterior septal defect, thought to be variable breast attenuation rather than ischemia.  Low risk study EF 67%.   Coronary CT 11/09/2019 showed coronary calcium score of 0 and normal coronary arteries. Low-sodium increased fiber heart healthy diet Increase physical activity as tolerated   3.  Hyperlipidemia LDL 67 06/2017 Continue Zetia 10 mg tablet daily Continue co-Q10 200 mg tablet daily Continue omega-3 fatty acids 1200 mg  tablet 3 times daily Continue rosuvastatin 40 mg tablet daily Low-sodium increased fiber heart healthy diet Increase physical activity as tolerated  4.  CKD stage III -creatinine 1.13 and BUN 22 06/2017 Followed by PCP   Disposition follow-up with Dr. Claiborne Billings in 1 year.  Jossie Ng. Maebelle Sulton NP-C    11/18/2019, 2:12 PM [facility] [address] Office [phone] Fax [phone]

## 2019-11-18 ENCOUNTER — Ambulatory Visit: Payer: Medicare PPO | Admitting: General Practice

## 2019-11-18 ENCOUNTER — Encounter: Payer: Self-pay | Admitting: General Practice

## 2019-11-18 ENCOUNTER — Other Ambulatory Visit: Payer: Self-pay

## 2019-11-18 VITALS — BP 128/76 | HR 67 | Ht 65.0 in | Wt 143.0 lb

## 2019-11-18 DIAGNOSIS — N183 Chronic kidney disease, stage 3 unspecified: Secondary | ICD-10-CM | POA: Diagnosis not present

## 2019-11-18 DIAGNOSIS — I10 Essential (primary) hypertension: Secondary | ICD-10-CM | POA: Diagnosis not present

## 2019-11-18 DIAGNOSIS — R0789 Other chest pain: Secondary | ICD-10-CM | POA: Diagnosis not present

## 2019-11-18 DIAGNOSIS — E7800 Pure hypercholesterolemia, unspecified: Secondary | ICD-10-CM | POA: Diagnosis not present

## 2019-11-18 NOTE — Patient Instructions (Signed)
Medication Instructions:  The current medical regimen is effective;  continue present plan and medications as directed. Please refer to the Current Medication list given to you today. *If you need a refill on your cardiac medications before your next appointment, please call your pharmacy*  Special Instructions PLEASE READ AND FOLLOW SALTY 6-ATTACHED  Follow-Up: Your next appointment:  12 month(s) Please call our office 2 months in advance to schedule this appointment In Person with You may see Shelva Majestic, MD Coletta Memos, FNP-C or one of the following Advanced Practice Providers on your designated Care Team:    Almyra Deforest, PA-C  Fabian Sharp, [place] or   Roby Lofts, PA-C  At [facility], you and your health needs are our priority.  As part of our continuing mission to provide you with exceptional heart care, we have created designated Provider Care Teams.  These Care Teams include your primary Cardiologist (physician) and Advanced Practice Providers (APPs -  Physician Assistants and Nurse Practitioners) who all work together to provide you with the care you need, when you need it.

## 2019-11-23 ENCOUNTER — Other Ambulatory Visit: Payer: Self-pay | Admitting: Cardiovascular Disease

## 2019-11-29 DIAGNOSIS — Z1339 Encounter for screening examination for other mental health and behavioral disorders: Secondary | ICD-10-CM | POA: Diagnosis not present

## 2019-11-29 DIAGNOSIS — M5412 Radiculopathy, cervical region: Secondary | ICD-10-CM | POA: Diagnosis not present

## 2019-11-29 DIAGNOSIS — Z1331 Encounter for screening for depression: Secondary | ICD-10-CM | POA: Diagnosis not present

## 2019-11-29 DIAGNOSIS — G43719 Chronic migraine without aura, intractable, without status migrainosus: Secondary | ICD-10-CM | POA: Diagnosis not present

## 2020-01-13 DIAGNOSIS — G43719 Chronic migraine without aura, intractable, without status migrainosus: Secondary | ICD-10-CM | POA: Diagnosis not present

## 2020-01-20 ENCOUNTER — Other Ambulatory Visit: Payer: Self-pay

## 2020-01-20 ENCOUNTER — Telehealth: Payer: Self-pay | Admitting: Internal Medicine

## 2020-01-20 DIAGNOSIS — K625 Hemorrhage of anus and rectum: Secondary | ICD-10-CM

## 2020-01-20 NOTE — Telephone Encounter (Signed)
[place]  SHE IS HAVING DARK STOOLS AND CAN NOT SEE DR Beckie Salts October.  WOULD LIKE TO KNOW IF THERE IS SOMETHING SHE SHOULD BE DOING FOR IT

## 2020-01-20 NOTE — Telephone Encounter (Signed)
We have only seen by once at time of screening colonoscopy in 02/2017.   1. Advise to go to ER if she has lightheadedness, fatigue, obvious blood in her stool or significant abdominal pain 2. We can check CBC to make sure she is not anemia.  3. Find out if she is on any ASA/NSAIDs, blood thinners,etc 4. She can add miralax one capful bid daily until soft stool and then continue once daily as needed to maintain soft stool.

## 2020-01-20 NOTE — Telephone Encounter (Signed)
Spoke with pt. Pt has c/o black stool that's been present since Sunday or Monday. Pt has been having some harder stool with constipation. Pt reports some straining Pt is currently taking iron pills and states she's been taking  Iron supplements for years with no changes in her stool. Pt reports abdominal discomfort but no abdominal pain, no n/v, no lightheadedness. Pt hasn't been seen in office for several years.

## 2020-01-20 NOTE — Telephone Encounter (Signed)
Spoke with pt. Pt was notified of Neil Crouch, PA recommendations. Pt will go to the ED if she feels lightheadedness, fatigue, shortness of breath, ect. Pt will have labs collected at [facility]. Orders placed. Instructions were given on Miralax.

## 2020-01-21 DIAGNOSIS — K625 Hemorrhage of anus and rectum: Secondary | ICD-10-CM | POA: Diagnosis not present

## 2020-01-21 LAB — CBC WITH DIFFERENTIAL/PLATELET
Absolute Monocytes: 429 cells/uL (ref 200–950)
Basophils Absolute: 28 cells/uL (ref 0–200)
Basophils Relative: 0.5 %
Eosinophils Absolute: 132 cells/uL (ref 15–500)
Eosinophils Relative: 2.4 %
HCT: 40.7 % (ref 35.0–45.0)
Hemoglobin: 13.4 g/dL (ref 11.7–15.5)
Lymphs Abs: 1128 cells/uL (ref 850–3900)
MCH: 30.2 pg (ref 27.0–33.0)
MCHC: 32.9 g/dL (ref 32.0–36.0)
MCV: 91.9 fL (ref 80.0–100.0)
MPV: 10.3 fL (ref 7.5–12.5)
Monocytes Relative: 7.8 %
Neutro Abs: 3784 cells/uL (ref 1500–7800)
Neutrophils Relative %: 68.8 %
Platelets: 204 10*3/uL (ref 140–400)
RBC: 4.43 10*6/uL (ref 3.80–5.10)
RDW: 12.7 % (ref 11.0–15.0)
Total Lymphocyte: 20.5 %
WBC: 5.5 10*3/uL (ref 3.8–10.8)

## 2020-01-26 DIAGNOSIS — M60811 Other myositis, right shoulder: Secondary | ICD-10-CM | POA: Diagnosis not present

## 2020-01-26 DIAGNOSIS — Z6823 Body mass index (BMI) 23.0-23.9, adult: Secondary | ICD-10-CM | POA: Diagnosis not present

## 2020-01-26 DIAGNOSIS — M60812 Other myositis, left shoulder: Secondary | ICD-10-CM | POA: Diagnosis not present

## 2020-01-27 ENCOUNTER — Other Ambulatory Visit: Payer: Self-pay | Admitting: General Practice

## 2020-01-27 DIAGNOSIS — I10 Essential (primary) hypertension: Secondary | ICD-10-CM

## 2020-02-01 DIAGNOSIS — M25511 Pain in right shoulder: Secondary | ICD-10-CM | POA: Diagnosis not present

## 2020-02-01 DIAGNOSIS — M25512 Pain in left shoulder: Secondary | ICD-10-CM | POA: Diagnosis not present

## 2020-02-01 DIAGNOSIS — M25612 Stiffness of left shoulder, not elsewhere classified: Secondary | ICD-10-CM | POA: Diagnosis not present

## 2020-02-01 DIAGNOSIS — M6281 Muscle weakness (generalized): Secondary | ICD-10-CM | POA: Diagnosis not present

## 2020-02-01 DIAGNOSIS — M1990 Unspecified osteoarthritis, unspecified site: Secondary | ICD-10-CM | POA: Diagnosis not present

## 2020-02-01 DIAGNOSIS — M25611 Stiffness of right shoulder, not elsewhere classified: Secondary | ICD-10-CM | POA: Diagnosis not present

## 2020-02-01 DIAGNOSIS — R293 Abnormal posture: Secondary | ICD-10-CM | POA: Diagnosis not present

## 2020-02-01 DIAGNOSIS — M799 Soft tissue disorder, unspecified: Secondary | ICD-10-CM | POA: Diagnosis not present

## 2020-02-03 ENCOUNTER — Other Ambulatory Visit (HOSPITAL_COMMUNITY): Payer: Self-pay | Admitting: Internal Medicine

## 2020-02-03 DIAGNOSIS — M25611 Stiffness of right shoulder, not elsewhere classified: Secondary | ICD-10-CM | POA: Diagnosis not present

## 2020-02-03 DIAGNOSIS — Z1231 Encounter for screening mammogram for malignant neoplasm of breast: Secondary | ICD-10-CM

## 2020-02-03 DIAGNOSIS — M6281 Muscle weakness (generalized): Secondary | ICD-10-CM | POA: Diagnosis not present

## 2020-02-03 DIAGNOSIS — M25511 Pain in right shoulder: Secondary | ICD-10-CM | POA: Diagnosis not present

## 2020-02-03 DIAGNOSIS — M25512 Pain in left shoulder: Secondary | ICD-10-CM | POA: Diagnosis not present

## 2020-02-03 DIAGNOSIS — R293 Abnormal posture: Secondary | ICD-10-CM | POA: Diagnosis not present

## 2020-02-03 DIAGNOSIS — M799 Soft tissue disorder, unspecified: Secondary | ICD-10-CM | POA: Diagnosis not present

## 2020-02-03 DIAGNOSIS — M25612 Stiffness of left shoulder, not elsewhere classified: Secondary | ICD-10-CM | POA: Diagnosis not present

## 2020-02-03 DIAGNOSIS — M1990 Unspecified osteoarthritis, unspecified site: Secondary | ICD-10-CM | POA: Diagnosis not present

## 2020-02-07 DIAGNOSIS — M6281 Muscle weakness (generalized): Secondary | ICD-10-CM | POA: Diagnosis not present

## 2020-02-07 DIAGNOSIS — M799 Soft tissue disorder, unspecified: Secondary | ICD-10-CM | POA: Diagnosis not present

## 2020-02-07 DIAGNOSIS — M1990 Unspecified osteoarthritis, unspecified site: Secondary | ICD-10-CM | POA: Diagnosis not present

## 2020-02-07 DIAGNOSIS — R293 Abnormal posture: Secondary | ICD-10-CM | POA: Diagnosis not present

## 2020-02-07 DIAGNOSIS — M25512 Pain in left shoulder: Secondary | ICD-10-CM | POA: Diagnosis not present

## 2020-02-07 DIAGNOSIS — M25611 Stiffness of right shoulder, not elsewhere classified: Secondary | ICD-10-CM | POA: Diagnosis not present

## 2020-02-07 DIAGNOSIS — M25612 Stiffness of left shoulder, not elsewhere classified: Secondary | ICD-10-CM | POA: Diagnosis not present

## 2020-02-07 DIAGNOSIS — M25511 Pain in right shoulder: Secondary | ICD-10-CM | POA: Diagnosis not present

## 2020-02-08 DIAGNOSIS — M25611 Stiffness of right shoulder, not elsewhere classified: Secondary | ICD-10-CM | POA: Diagnosis not present

## 2020-02-08 DIAGNOSIS — M6281 Muscle weakness (generalized): Secondary | ICD-10-CM | POA: Diagnosis not present

## 2020-02-08 DIAGNOSIS — M25612 Stiffness of left shoulder, not elsewhere classified: Secondary | ICD-10-CM | POA: Diagnosis not present

## 2020-02-08 DIAGNOSIS — R293 Abnormal posture: Secondary | ICD-10-CM | POA: Diagnosis not present

## 2020-02-08 DIAGNOSIS — M799 Soft tissue disorder, unspecified: Secondary | ICD-10-CM | POA: Diagnosis not present

## 2020-02-08 DIAGNOSIS — M25511 Pain in right shoulder: Secondary | ICD-10-CM | POA: Diagnosis not present

## 2020-02-08 DIAGNOSIS — M1990 Unspecified osteoarthritis, unspecified site: Secondary | ICD-10-CM | POA: Diagnosis not present

## 2020-02-08 DIAGNOSIS — M25512 Pain in left shoulder: Secondary | ICD-10-CM | POA: Diagnosis not present

## 2020-02-10 DIAGNOSIS — M799 Soft tissue disorder, unspecified: Secondary | ICD-10-CM | POA: Diagnosis not present

## 2020-02-10 DIAGNOSIS — M25512 Pain in left shoulder: Secondary | ICD-10-CM | POA: Diagnosis not present

## 2020-02-10 DIAGNOSIS — M25612 Stiffness of left shoulder, not elsewhere classified: Secondary | ICD-10-CM | POA: Diagnosis not present

## 2020-02-10 DIAGNOSIS — M1990 Unspecified osteoarthritis, unspecified site: Secondary | ICD-10-CM | POA: Diagnosis not present

## 2020-02-10 DIAGNOSIS — M6281 Muscle weakness (generalized): Secondary | ICD-10-CM | POA: Diagnosis not present

## 2020-02-10 DIAGNOSIS — M25611 Stiffness of right shoulder, not elsewhere classified: Secondary | ICD-10-CM | POA: Diagnosis not present

## 2020-02-10 DIAGNOSIS — R293 Abnormal posture: Secondary | ICD-10-CM | POA: Diagnosis not present

## 2020-02-10 DIAGNOSIS — M25511 Pain in right shoulder: Secondary | ICD-10-CM | POA: Diagnosis not present

## 2020-02-22 DIAGNOSIS — R293 Abnormal posture: Secondary | ICD-10-CM | POA: Diagnosis not present

## 2020-02-22 DIAGNOSIS — M6281 Muscle weakness (generalized): Secondary | ICD-10-CM | POA: Diagnosis not present

## 2020-02-22 DIAGNOSIS — M25511 Pain in right shoulder: Secondary | ICD-10-CM | POA: Diagnosis not present

## 2020-02-22 DIAGNOSIS — M25512 Pain in left shoulder: Secondary | ICD-10-CM | POA: Diagnosis not present

## 2020-02-22 DIAGNOSIS — M25612 Stiffness of left shoulder, not elsewhere classified: Secondary | ICD-10-CM | POA: Diagnosis not present

## 2020-02-22 DIAGNOSIS — M25611 Stiffness of right shoulder, not elsewhere classified: Secondary | ICD-10-CM | POA: Diagnosis not present

## 2020-02-22 DIAGNOSIS — M799 Soft tissue disorder, unspecified: Secondary | ICD-10-CM | POA: Diagnosis not present

## 2020-02-22 DIAGNOSIS — M1990 Unspecified osteoarthritis, unspecified site: Secondary | ICD-10-CM | POA: Diagnosis not present

## 2020-02-23 ENCOUNTER — Encounter: Payer: Self-pay | Admitting: Internal Medicine

## 2020-02-23 ENCOUNTER — Other Ambulatory Visit: Payer: Self-pay

## 2020-02-23 ENCOUNTER — Telehealth: Payer: Self-pay | Admitting: *Deleted

## 2020-02-23 ENCOUNTER — Ambulatory Visit: Payer: Medicare PPO | Admitting: Internal Medicine

## 2020-02-23 VITALS — BP 112/55 | HR 62 | Temp 97.0°F | Ht 65.0 in | Wt 147.8 lb

## 2020-02-23 DIAGNOSIS — K219 Gastro-esophageal reflux disease without esophagitis: Secondary | ICD-10-CM | POA: Diagnosis not present

## 2020-02-23 DIAGNOSIS — Z1211 Encounter for screening for malignant neoplasm of colon: Secondary | ICD-10-CM

## 2020-02-23 DIAGNOSIS — M25611 Stiffness of right shoulder, not elsewhere classified: Secondary | ICD-10-CM | POA: Diagnosis not present

## 2020-02-23 DIAGNOSIS — M1990 Unspecified osteoarthritis, unspecified site: Secondary | ICD-10-CM | POA: Diagnosis not present

## 2020-02-23 DIAGNOSIS — Z86010 Personal history of colonic polyps: Secondary | ICD-10-CM | POA: Diagnosis not present

## 2020-02-23 DIAGNOSIS — R293 Abnormal posture: Secondary | ICD-10-CM | POA: Diagnosis not present

## 2020-02-23 DIAGNOSIS — M25512 Pain in left shoulder: Secondary | ICD-10-CM | POA: Diagnosis not present

## 2020-02-23 DIAGNOSIS — K5900 Constipation, unspecified: Secondary | ICD-10-CM | POA: Diagnosis not present

## 2020-02-23 DIAGNOSIS — M25511 Pain in right shoulder: Secondary | ICD-10-CM | POA: Diagnosis not present

## 2020-02-23 DIAGNOSIS — K921 Melena: Secondary | ICD-10-CM | POA: Diagnosis not present

## 2020-02-23 DIAGNOSIS — M799 Soft tissue disorder, unspecified: Secondary | ICD-10-CM | POA: Diagnosis not present

## 2020-02-23 DIAGNOSIS — M25612 Stiffness of left shoulder, not elsewhere classified: Secondary | ICD-10-CM | POA: Diagnosis not present

## 2020-02-23 DIAGNOSIS — M6281 Muscle weakness (generalized): Secondary | ICD-10-CM | POA: Diagnosis not present

## 2020-02-23 NOTE — Telephone Encounter (Signed)
LMOVM for pt to call back to schedule TCS/EGD, Dr. Abbey Chatters, asa 2

## 2020-02-23 NOTE — Patient Instructions (Signed)
We will schedule you for EGD to further evaluate your reflux and black stools.  At the same time we will perform a colonoscopy both for your chronic constipation and for history of colon polyps in the past.  Further recommendations to follow.  Lifestyle and home remedies TO MANAGE REFLUX/HEARTBURN    You may eliminate or reduce the frequency of heartburn by making the following lifestyle changes:    Control your weight. Being overweight is a major risk factor for heartburn and GERD. Excess pounds put pressure on your abdomen, pushing up your stomach and causing acid to back up into your esophagus.     Eat smaller meals. 4 TO 6 MEALS A DAY. This reduces pressure on the lower esophageal sphincter, helping to prevent the valve from opening and acid from washing back into your esophagus.      Loosen your belt. Clothes that fit tightly around your waist put pressure on your abdomen and the lower esophageal sphincter.      Eliminate heartburn triggers. Everyone has specific triggers. Common triggers such as fatty or fried foods, spicy food, tomato sauce, carbonated beverages, alcohol, chocolate, mint, garlic, onion, caffeine and nicotine may make heartburn worse.     Avoid stooping or bending. Tying your shoes is OK. Bending over for longer periods to weed your garden isn't, especially soon after eating.     Don't lie down after a meal. Wait at least three to four hours after eating before going to bed, and don't lie down right after eating.   At [facility] we value your feedback. You may receive a survey about your visit today. Please share your experience as we strive to create trusting relationships with our patients to provide genuine, compassionate, quality care.  We appreciate your understanding and patience as we review any laboratory studies, imaging, and other diagnostic tests that are ordered as we care for you. Our office policy is 5 business days for review of  these results, and any emergent or urgent results are addressed in a timely manner for your best interest. If you do not hear from our office in 1 week, please contact us.   We also encourage the use of MyChart, which contains your medical information for your review as well. If you are not enrolled in this feature, an access code is on this after visit summary for your convenience. Thank you for allowing [place] to be involved in your care.  It was great to see you today!  I hope you have a great rest of your fall!!    Taquilla Downum K. Abbey Chatters, D.O. Gastroenterology and Hepatology [facility]

## 2020-02-23 NOTE — Telephone Encounter (Signed)
Called pt. She has been scheduled for 11/5 at 10:00am. Aware will need covid test prior. Advised will mail with prep instructions. Confirmed mailing address.

## 2020-02-23 NOTE — Telephone Encounter (Signed)
PA for TCS/EGD approved via [employer]. Auth# [id number] DOS 03/17/2020-04/16/2020

## 2020-02-23 NOTE — H&P (View-Only) (Signed)
Referring Provider: Redmond School, MD Primary Care Physician:  Redmond School, MD Primary GI:  Dr. Abbey Chatters  Chief Complaint  Patient presents with  . Melena    off/on, strains w/ BM, takes iron daily. Last TCS 2018    HPI:   Mary [name] is a 69 y.o. female who presents to the clinic today to discuss multiple GI complaints.  She states that starting approximately 4 to 5 months ago she developed constipation.  This is a new problem for her as she has been "regular" her entire life.  Notes hard stools.  She recently started on Dulcolax twice daily and states her symptoms are improved.  Also has noticed black stools with occasional blood.  She states she has been on iron for years and never had stools like this.  Also notes history of an ulcer in the past.  Reports that she has reflux which is also new onset.  She is try to manage this conservatively with raising the head of her bed and not eating within 3 hours of bedtime.  Last colonoscopy was 3 years ago where she had multiple tubular adenomas removed.  No family history of colorectal malignancy.  No hematochezia.  No unintentional weight loss.  Past Medical History:  Diagnosis Date  . Anemia   . Arthritis   . Chronic kidney disease    idiopathic micro hematuria   . Complication of anesthesia    patient states does not take much medicine to put her to sleep   . Edema    ankles to knees   . GERD (gastroesophageal reflux disease)   . Headache(784.0)    hx of migraines   . Hyperlipidemia   . Mitral valve prolapse    no symptoms   . PONV (postoperative nausea and vomiting)   . Scalp alopecia   . Seizures ([place])    1 seizure 45 years ago; unknown etiology and no meds, no seizures since then.    Past Surgical History:  Procedure Laterality Date  . CERVICAL FUSION  2006   cervical 5-6   . COLONOSCOPY N/A 03/03/2017   Procedure: COLONOSCOPY;  Surgeon: Danie Binder, MD;  Location: [facility];  Service: Endoscopy;   Laterality: N/A;  1:15 pm  . HEMORRHOID SURGERY N/A 06/23/2017   Procedure: ANOSCOPY WITH REMOVAL OF SINGLE LESION;  Surgeon: [name], MD;  Location: [facility];  Service: General;  Laterality: N/A;  . MYOCARDIAL PERFUSION STUDY  07/02/2011   NO SCINTIGRAPHIC EVIDENCE OF INDUCIBLE MYOCARDIAL ISCHEMIA. POST-STRESS EF IS 87%.EXERCISE CAPACITY 9 METS. EKG SHOWS NSR AT 69. THERE IS 1MM HORIZONTAL ST DEPRESSION WITH EXERCISE IN II, III, AVF SEEN BEST AT EARLY RECOVERY. THIS IS PRESENT IN V3-V5 AS WELL AND RETURNS TO BASELINE AT THE END OF THE TEST. NORMAL STUDY.  Marland Kitchen RENAL ARTERY DOPPLER  12/28/2003   CELIAC ARTERY: AT REST, 168.4 CM/S; INSPRIATION, 177.6 CM/S. ARCUATE LIGAMENT COMPRESSION SYNDROME. R & L KIDNEYS: RIGHT KIDNEY MEASURES SOME WHAT SMALLER THAN LEFT. KIDNEY ARE ESSENTIALLY SYMMETRICAL IN SHAPE WITH NO OBVIOUS ABNORMALITY VISUALIZED. R & L RENAL ARTERIES: DEMONSTRATE NORMAL SPECTRA. NO SUGGESTION OF DIAMETER REDUCTION, DISSECTION, FIBROMUSCULAR DYSPLASIA OR ANY OTHER VASCULAR ABNOR  . TOTAL KNEE ARTHROPLASTY Right 08/31/2013   Procedure: RIGHT TOTAL KNEE ARTHROPLASTY;  Surgeon: [name], MD;  Location: [facility];  Service: Orthopedics;  Laterality: Right;  . TOTAL KNEE ARTHROPLASTY Left 03/07/2015   Procedure: TOTAL KNEE ARTHROPLASTY;  Surgeon: [name], MD;  Location: [facility];  Service: Orthopedics;  Laterality: Left;  . TRANSTHORACIC ECHOCARDIOGRAM  5/95/6387   LV SYSTOLIC FUNCTION NORMAL, EF=>55%, INSIGNIFICANT PERICARDIAL EFFUSION, LEFT ATRIAL SIZE IS NORMAL, RV SYSTOLIC PRESSURE IS NORMAL. NO SIGN VALVULAR HEART DISEASE.  . TUBAL LIGATION  1986  . veins stripping      bilateral legs     Current Outpatient Medications  Medication Sig Dispense Refill  . acetaminophen (TYLENOL) 500 MG tablet 500 mg every 6 (six) hours as needed.     Marland Kitchen amLODipine (NORVASC) 2.5 MG tablet TAKE 1 TABLET BY MOUTH ONCE A DAY. 90 tablet 3  . amoxicillin (AMOXIL) 500 MG capsule TAKE 4 CAPSULES BY MOUTHO1  HOUR BEFORE DENTAL APPOINTMENT    . CALCIUM CITRATE PO Take 500 mg by mouth 3 (three) times daily.    . candesartan (ATACAND) 32 MG tablet Take 32 mg by mouth daily.    . Cinnamon 500 MG capsule Take 500 mg by mouth daily.    . Coenzyme Q10 200 MG capsule Take 200 mg by mouth daily.     Marland Kitchen EMGALITY 120 MG/ML SOAJ INJECT ONE PEN MONTHLY ASTDIRECTED.RYO    . ezetimibe (ZETIA) 10 MG tablet TAKE 1 TABLET BY MOUTH ONCE A DAY. 90 tablet 3  . Ferrous Sulfate (IRON) 325 (65 Fe) MG TABS Take 325 mg by mouth every evening.     . fexofenadine (ALLEGRA) 180 MG tablet Take 180 mg by mouth daily.    . Glucosamine-Chondroitin 750-600 MG TABS Take 1 tablet by mouth 2 (two) times daily.     Marland Kitchen ipratropium (ATROVENT) 0.06 % nasal spray Place 2 sprays into both nostrils 2 (two) times daily as needed (for allergies.).     Marland Kitchen magnesium gluconate (MAGONATE) 500 MG tablet Take 500 mg by mouth daily at 12 noon.     . montelukast (SINGULAIR) 10 MG tablet Take 10 mg by mouth at bedtime.    . Multiple Vitamin (MULTIVITAMIN WITH MINERALS) TABS tablet Take 1 tablet by mouth daily. Centrum Silver    . polyethylene glycol (MIRALAX / GLYCOLAX) 17 g packet Take 17 g by mouth daily as needed.    . pyridOXINE (VITAMIN B-6) 100 MG tablet Take 100 mg by mouth daily.    . SUMAtriptan Succinate Refill (IMITREX STATDOSE REFILL) 4 MG/0.5ML SOCT Inject 4 mg into the skin as needed (for migraines).    . Omega-3 Fatty Acids (FISH OIL) 1200 MG CAPS Take 1,200 mg by mouth 3 (three) times daily.     . rosuvastatin (CRESTOR) 40 MG tablet TAKE 1 TABLET BY MOUTH AT BEDTIME. (Patient not taking: Reported on 02/23/2020) 90 tablet 1  . thiamine (VITAMIN B-1) 100 MG tablet Take 100 mg by mouth daily. (Patient not taking: Reported on 02/23/2020)     No current facility-administered medications for this visit.    Allergies as of 02/23/2020 - Review Complete 02/23/2020  Allergen Reaction Noted  . Metoprolol Rash 02/28/2015  . Sporanox  [itraconazole] Swelling and Other (See Comments) 05/20/2013    Family History  Problem Relation Age of Onset  . Dementia Mother   . Heart attack Father   . Heart disease Father   . Kidney failure Father   . Heart attack Brother        age 39  . Cancer - Other Brother        kidney  . Colon cancer Paternal Aunt     Social History   Socioeconomic History  . Marital status: Widowed    Spouse name: Not  on file  . Number of children: Not on file  . Years of education: Not on file  . Highest education level: Not on file  Occupational History  . Not on file  Tobacco Use  . Smoking status: Former Smoker    Packs/day: 2.00    Years: 22.00    Pack years: 44.00    Types: Cigarettes    Quit date: 05/13/1988    Years since quitting: 31.8  . Smokeless tobacco: Never Used  Vaping Use  . Vaping Use: Never used  Substance and Sexual Activity  . Alcohol use: Yes    Comment: wine- rarely.  . Drug use: No  . Sexual activity: Not Currently    Birth control/protection: Post-menopausal  Other Topics Concern  . Not on file  Social History Narrative  . Not on file   Social Determinants of Health   Financial Resource Strain:   . Difficulty of Paying Living Expenses: Not on file  Food Insecurity:   . Worried About Charity fundraiser in the Last Year: Not on file  . Ran Out of Food in the Last Year: Not on file  Transportation Needs:   . Lack of Transportation (Medical): Not on file  . Lack of Transportation (Non-Medical): Not on file  Physical Activity:   . Days of Exercise per Week: Not on file  . Minutes of Exercise per Session: Not on file  Stress:   . Feeling of Stress : Not on file  Social Connections:   . Frequency of Communication with Friends and Family: Not on file  . Frequency of Social Gatherings with Friends and Family: Not on file  . Attends Religious Services: Not on file  . Active Member of Clubs or Organizations: Not on file  . Attends Archivist  Meetings: Not on file  . Marital Status: Not on file    Subjective: Review of Systems  Constitutional: Negative for chills and fever.  HENT: Negative for congestion and hearing loss.   Eyes: Negative for blurred vision and double vision.  Respiratory: Negative for cough and shortness of breath.   Cardiovascular: Negative for chest pain and palpitations.  Gastrointestinal: Positive for constipation and melena. Negative for abdominal pain, blood in stool, diarrhea, heartburn and vomiting.  Genitourinary: Negative for dysuria and urgency.  Musculoskeletal: Negative for joint pain and myalgias.  Skin: Negative for itching and rash.  Neurological: Negative for dizziness and headaches.  Psychiatric/Behavioral: Negative for depression. The patient is not nervous/anxious.      Objective: BP (!) 112/55   Pulse 62   Temp (!) 97 F (36.1 C) (Oral)   Ht 5\' 5"  (1.651 m)   Wt 147 lb 12.8 oz (67 kg)   BMI 24.60 kg/m  Physical Exam Constitutional:      Appearance: Normal appearance.  HENT:     Head: Normocephalic and atraumatic.  Eyes:     Extraocular Movements: Extraocular movements intact.     Conjunctiva/sclera: Conjunctivae normal.  Cardiovascular:     Rate and Rhythm: Normal rate and regular rhythm.  Pulmonary:     Effort: Pulmonary effort is normal.     Breath sounds: Normal breath sounds.  Abdominal:     General: Bowel sounds are normal.     Palpations: Abdomen is soft.  Musculoskeletal:        General: No swelling. Normal range of motion.     Cervical back: Normal range of motion and neck supple.  Skin:    General: Skin  is warm and dry.     Coloration: Skin is not jaundiced.  Neurological:     General: No focal deficit present.     Mental Status: She is alert and oriented to person, place, and time.  Psychiatric:        Mood and Affect: Mood normal.        Behavior: Behavior normal.      Assessment: *Constipation-new onset, relatively well controlled on  Dulcolax *Reflux-new issue, currently maintained with conservative measures *History of adenomatous colon polyps *Melena  Plan: For patient's reflux and melena, Will schedule for EGD to evaluate for peptic ulcer disease, esophagitis, gastritis, H. Pylori, duodenitis, or other. Will also evaluate for esophageal stricture, Schatzki's ring, esophageal web or other.   At the same time we will perform colonoscopy both due to history of adenomatous colon polyps as well as new onset constipation.  The risks including infection, bleed, or perforation as well as benefits, limitations, alternatives and imponderables have been reviewed with the patient. Potential for esophageal dilation, biopsy, etc. have also been reviewed.  Questions have been answered. All parties agreeable.  Continue on Dulcolax twice daily for chronic constipation.  I have printed patient information on home practices to reduce reflux symptoms.  She may need PPI therapy pending endoscopic evaluation.   02/23/2020 11:41 AM   Disclaimer: This note was dictated with voice recognition software. Similar sounding words can inadvertently be transcribed and may not be corrected upon review.

## 2020-02-23 NOTE — Progress Notes (Signed)
Referring Provider: Redmond School, MD Primary Care Physician:  Redmond School, MD Primary GI:  Dr. Abbey Chatters  Chief Complaint  Patient presents with  . Melena    off/on, strains w/ BM, takes iron daily. Last TCS 2018    HPI:   Mary [name] is a 68 y.o. female who presents to the clinic today to discuss multiple GI complaints.  She states that starting approximately 4 to 5 months ago she developed constipation.  This is a new problem for her as she has been "regular" her entire life.  Notes hard stools.  She recently started on Dulcolax twice daily and states her symptoms are improved.  Also has noticed black stools with occasional blood.  She states she has been on iron for years and never had stools like this.  Also notes history of an ulcer in the past.  Reports that she has reflux which is also new onset.  She is try to manage this conservatively with raising the head of her bed and not eating within 3 hours of bedtime.  Last colonoscopy was 3 years ago where she had multiple tubular adenomas removed.  No family history of colorectal malignancy.  No hematochezia.  No unintentional weight loss.  Past Medical History:  Diagnosis Date  . Anemia   . Arthritis   . Chronic kidney disease    idiopathic micro hematuria   . Complication of anesthesia    patient states does not take much medicine to put her to sleep   . Edema    ankles to knees   . GERD (gastroesophageal reflux disease)   . Headache(784.0)    hx of migraines   . Hyperlipidemia   . Mitral valve prolapse    no symptoms   . PONV (postoperative nausea and vomiting)   . Scalp alopecia   . Seizures ([place])    1 seizure 45 years ago; unknown etiology and no meds, no seizures since then.    Past Surgical History:  Procedure Laterality Date  . CERVICAL FUSION  2006   cervical 5-6   . COLONOSCOPY N/A 03/03/2017   Procedure: COLONOSCOPY;  Surgeon: Danie Binder, MD;  [facility];  Service: Endoscopy;   Laterality: N/A;  1:15 pm  . HEMORRHOID SURGERY N/A 06/23/2017   Procedure: ANOSCOPY WITH REMOVAL OF SINGLE LESION;  Surgeon: [name], MD;  Location: [facility];  Service: General;  Laterality: N/A;  . MYOCARDIAL PERFUSION STUDY  07/02/2011   NO SCINTIGRAPHIC EVIDENCE OF INDUCIBLE MYOCARDIAL ISCHEMIA. POST-STRESS EF IS 87%.EXERCISE CAPACITY 9 METS. EKG SHOWS NSR AT 69. THERE IS 1MM HORIZONTAL ST DEPRESSION WITH EXERCISE IN II, III, AVF SEEN BEST AT EARLY RECOVERY. THIS IS PRESENT IN V3-V5 AS WELL AND RETURNS TO BASELINE AT THE END OF THE TEST. NORMAL STUDY.  Marland Kitchen RENAL ARTERY DOPPLER  12/28/2003   CELIAC ARTERY: AT REST, 168.4 CM/S; INSPRIATION, 177.6 CM/S. ARCUATE LIGAMENT COMPRESSION SYNDROME. R & L KIDNEYS: RIGHT KIDNEY MEASURES SOME WHAT SMALLER THAN LEFT. KIDNEY ARE ESSENTIALLY SYMMETRICAL IN SHAPE WITH NO OBVIOUS ABNORMALITY VISUALIZED. R & L RENAL ARTERIES: DEMONSTRATE NORMAL SPECTRA. NO SUGGESTION OF DIAMETER REDUCTION, DISSECTION, FIBROMUSCULAR DYSPLASIA OR ANY OTHER VASCULAR ABNOR  . TOTAL KNEE ARTHROPLASTY Right 08/31/2013   Procedure: RIGHT TOTAL KNEE ARTHROPLASTY;  Surgeon: [name], MD;  Location: [facility];  Service: Orthopedics;  Laterality: Right;  . TOTAL KNEE ARTHROPLASTY Left 03/07/2015   Procedure: TOTAL KNEE ARTHROPLASTY;  Surgeon: [name], MD;  Location: [facility];  Service: Orthopedics;  Laterality: Left;  . TRANSTHORACIC ECHOCARDIOGRAM  5/39/7673   LV SYSTOLIC FUNCTION NORMAL, EF=>55%, INSIGNIFICANT PERICARDIAL EFFUSION, LEFT ATRIAL SIZE IS NORMAL, RV SYSTOLIC PRESSURE IS NORMAL. NO SIGN VALVULAR HEART DISEASE.  . TUBAL LIGATION  1986  . veins stripping      bilateral legs     Current Outpatient Medications  Medication Sig Dispense Refill  . acetaminophen (TYLENOL) 500 MG tablet 500 mg every 6 (six) hours as needed.     Marland Kitchen amLODipine (NORVASC) 2.5 MG tablet TAKE 1 TABLET BY MOUTH ONCE A DAY. 90 tablet 3  . amoxicillin (AMOXIL) 500 MG capsule TAKE 4 CAPSULES BY MOUTHO1  HOUR BEFORE DENTAL APPOINTMENT    . CALCIUM CITRATE PO Take 500 mg by mouth 3 (three) times daily.    . candesartan (ATACAND) 32 MG tablet Take 32 mg by mouth daily.    . Cinnamon 500 MG capsule Take 500 mg by mouth daily.    . Coenzyme Q10 200 MG capsule Take 200 mg by mouth daily.     Marland Kitchen EMGALITY 120 MG/ML SOAJ INJECT ONE PEN MONTHLY ASTDIRECTED.RYO    . ezetimibe (ZETIA) 10 MG tablet TAKE 1 TABLET BY MOUTH ONCE A DAY. 90 tablet 3  . Ferrous Sulfate (IRON) 325 (65 Fe) MG TABS Take 325 mg by mouth every evening.     . fexofenadine (ALLEGRA) 180 MG tablet Take 180 mg by mouth daily.    . Glucosamine-Chondroitin 750-600 MG TABS Take 1 tablet by mouth 2 (two) times daily.     Marland Kitchen ipratropium (ATROVENT) 0.06 % nasal spray Place 2 sprays into both nostrils 2 (two) times daily as needed (for allergies.).     Marland Kitchen magnesium gluconate (MAGONATE) 500 MG tablet Take 500 mg by mouth daily at 12 noon.     . montelukast (SINGULAIR) 10 MG tablet Take 10 mg by mouth at bedtime.    . Multiple Vitamin (MULTIVITAMIN WITH MINERALS) TABS tablet Take 1 tablet by mouth daily. Centrum Silver    . polyethylene glycol (MIRALAX / GLYCOLAX) 17 g packet Take 17 g by mouth daily as needed.    . pyridOXINE (VITAMIN B-6) 100 MG tablet Take 100 mg by mouth daily.    . SUMAtriptan Succinate Refill (IMITREX STATDOSE REFILL) 4 MG/0.5ML SOCT Inject 4 mg into the skin as needed (for migraines).    . Omega-3 Fatty Acids (FISH OIL) 1200 MG CAPS Take 1,200 mg by mouth 3 (three) times daily.     . rosuvastatin (CRESTOR) 40 MG tablet TAKE 1 TABLET BY MOUTH AT BEDTIME. (Patient not taking: Reported on 02/23/2020) 90 tablet 1  . thiamine (VITAMIN B-1) 100 MG tablet Take 100 mg by mouth daily. (Patient not taking: Reported on 02/23/2020)     No current facility-administered medications for this visit.    Allergies as of 02/23/2020 - Review Complete 02/23/2020  Allergen Reaction Noted  . Metoprolol Rash 02/28/2015  . Sporanox  [itraconazole] Swelling and Other (See Comments) 05/20/2013    Family History  Problem Relation Age of Onset  . Dementia Mother   . Heart attack Father   . Heart disease Father   . Kidney failure Father   . Heart attack Brother        age 78  . Cancer - Other Brother        kidney  . Colon cancer Paternal Aunt     Social History   Socioeconomic History  . Marital status: Widowed    Spouse name: Not  on file  . Number of children: Not on file  . Years of education: Not on file  . Highest education level: Not on file  Occupational History  . Not on file  Tobacco Use  . Smoking status: Former Smoker    Packs/day: 2.00    Years: 22.00    Pack years: 44.00    Types: Cigarettes    Quit date: 05/13/1988    Years since quitting: 31.8  . Smokeless tobacco: Never Used  Vaping Use  . Vaping Use: Never used  Substance and Sexual Activity  . Alcohol use: Yes    Comment: wine- rarely.  . Drug use: No  . Sexual activity: Not Currently    Birth control/protection: Post-menopausal  Other Topics Concern  . Not on file  Social History Narrative  . Not on file   Social Determinants of Health   Financial Resource Strain:   . Difficulty of Paying Living Expenses: Not on file  Food Insecurity:   . Worried About Charity fundraiser in the Last Year: Not on file  . Ran Out of Food in the Last Year: Not on file  Transportation Needs:   . Lack of Transportation (Medical): Not on file  . Lack of Transportation (Non-Medical): Not on file  Physical Activity:   . Days of Exercise per Week: Not on file  . Minutes of Exercise per Session: Not on file  Stress:   . Feeling of Stress : Not on file  Social Connections:   . Frequency of Communication with Friends and Family: Not on file  . Frequency of Social Gatherings with Friends and Family: Not on file  . Attends Religious Services: Not on file  . Active Member of Clubs or Organizations: Not on file  . Attends Archivist  Meetings: Not on file  . Marital Status: Not on file    Subjective: Review of Systems  Constitutional: Negative for chills and fever.  HENT: Negative for congestion and hearing loss.   Eyes: Negative for blurred vision and double vision.  Respiratory: Negative for cough and shortness of breath.   Cardiovascular: Negative for chest pain and palpitations.  Gastrointestinal: Positive for constipation and melena. Negative for abdominal pain, blood in stool, diarrhea, heartburn and vomiting.  Genitourinary: Negative for dysuria and urgency.  Musculoskeletal: Negative for joint pain and myalgias.  Skin: Negative for itching and rash.  Neurological: Negative for dizziness and headaches.  Psychiatric/Behavioral: Negative for depression. The patient is not nervous/anxious.      Objective: BP (!) 112/55   Pulse 62   Temp (!) 97 F (36.1 C) (Oral)   Ht 5\' 5"  (1.651 m)   Wt 147 lb 12.8 oz (67 kg)   BMI 24.60 kg/m  Physical Exam Constitutional:      Appearance: Normal appearance.  HENT:     Head: Normocephalic and atraumatic.  Eyes:     Extraocular Movements: Extraocular movements intact.     Conjunctiva/sclera: Conjunctivae normal.  Cardiovascular:     Rate and Rhythm: Normal rate and regular rhythm.  Pulmonary:     Effort: Pulmonary effort is normal.     Breath sounds: Normal breath sounds.  Abdominal:     General: Bowel sounds are normal.     Palpations: Abdomen is soft.  Musculoskeletal:        General: No swelling. Normal range of motion.     Cervical back: Normal range of motion and neck supple.  Skin:    General: Skin  is warm and dry.     Coloration: Skin is not jaundiced.  Neurological:     General: No focal deficit present.     Mental Status: She is alert and oriented to person, place, and time.  Psychiatric:        Mood and Affect: Mood normal.        Behavior: Behavior normal.      Assessment: *Constipation-new onset, relatively well controlled on  Dulcolax *Reflux-new issue, currently maintained with conservative measures *History of adenomatous colon polyps *Melena  Plan: For patient's reflux and melena, Will schedule for EGD to evaluate for peptic ulcer disease, esophagitis, gastritis, H. Pylori, duodenitis, or other. Will also evaluate for esophageal stricture, Schatzki's ring, esophageal web or other.   At the same time we will perform colonoscopy both due to history of adenomatous colon polyps as well as new onset constipation.  The risks including infection, bleed, or perforation as well as benefits, limitations, alternatives and imponderables have been reviewed with the patient. Potential for esophageal dilation, biopsy, etc. have also been reviewed.  Questions have been answered. All parties agreeable.  Continue on Dulcolax twice daily for chronic constipation.  I have printed patient information on home practices to reduce reflux symptoms.  She may need PPI therapy pending endoscopic evaluation.   02/23/2020 11:41 AM   Disclaimer: This note was dictated with voice recognition software. Similar sounding words can inadvertently be transcribed and may not be corrected upon review.

## 2020-02-23 NOTE — Telephone Encounter (Signed)
Pt would like you to call her back when you get a chance please?  [phone]

## 2020-02-24 DIAGNOSIS — M25612 Stiffness of left shoulder, not elsewhere classified: Secondary | ICD-10-CM | POA: Diagnosis not present

## 2020-02-24 DIAGNOSIS — M25611 Stiffness of right shoulder, not elsewhere classified: Secondary | ICD-10-CM | POA: Diagnosis not present

## 2020-02-24 DIAGNOSIS — M25511 Pain in right shoulder: Secondary | ICD-10-CM | POA: Diagnosis not present

## 2020-02-24 DIAGNOSIS — M799 Soft tissue disorder, unspecified: Secondary | ICD-10-CM | POA: Diagnosis not present

## 2020-02-24 DIAGNOSIS — M6281 Muscle weakness (generalized): Secondary | ICD-10-CM | POA: Diagnosis not present

## 2020-02-24 DIAGNOSIS — M1990 Unspecified osteoarthritis, unspecified site: Secondary | ICD-10-CM | POA: Diagnosis not present

## 2020-02-24 DIAGNOSIS — R293 Abnormal posture: Secondary | ICD-10-CM | POA: Diagnosis not present

## 2020-02-24 DIAGNOSIS — M25512 Pain in left shoulder: Secondary | ICD-10-CM | POA: Diagnosis not present

## 2020-02-28 ENCOUNTER — Telehealth: Payer: Self-pay | Admitting: Internal Medicine

## 2020-02-28 ENCOUNTER — Telehealth: Payer: Self-pay

## 2020-02-28 DIAGNOSIS — M25612 Stiffness of left shoulder, not elsewhere classified: Secondary | ICD-10-CM | POA: Diagnosis not present

## 2020-02-28 DIAGNOSIS — M799 Soft tissue disorder, unspecified: Secondary | ICD-10-CM | POA: Diagnosis not present

## 2020-02-28 DIAGNOSIS — M1990 Unspecified osteoarthritis, unspecified site: Secondary | ICD-10-CM | POA: Diagnosis not present

## 2020-02-28 DIAGNOSIS — M25511 Pain in right shoulder: Secondary | ICD-10-CM | POA: Diagnosis not present

## 2020-02-28 DIAGNOSIS — M6281 Muscle weakness (generalized): Secondary | ICD-10-CM | POA: Diagnosis not present

## 2020-02-28 DIAGNOSIS — M25512 Pain in left shoulder: Secondary | ICD-10-CM | POA: Diagnosis not present

## 2020-02-28 DIAGNOSIS — M25611 Stiffness of right shoulder, not elsewhere classified: Secondary | ICD-10-CM | POA: Diagnosis not present

## 2020-02-28 DIAGNOSIS — R293 Abnormal posture: Secondary | ICD-10-CM | POA: Diagnosis not present

## 2020-02-28 NOTE — Telephone Encounter (Signed)
Pt wants to reschedule her procedure. [phone]

## 2020-02-28 NOTE — Telephone Encounter (Signed)
Please call pt to r/s her TCS. Pt has an event on the day that she's scheduled to have a TCS.

## 2020-02-29 NOTE — Telephone Encounter (Signed)
Called pt. She has rescheduled to 11/12 at 9:15am. Aware will mail new covid test appt with prep instructions. She voiced understanding.

## 2020-03-01 DIAGNOSIS — M25511 Pain in right shoulder: Secondary | ICD-10-CM | POA: Diagnosis not present

## 2020-03-01 DIAGNOSIS — M25512 Pain in left shoulder: Secondary | ICD-10-CM | POA: Diagnosis not present

## 2020-03-01 DIAGNOSIS — R293 Abnormal posture: Secondary | ICD-10-CM | POA: Diagnosis not present

## 2020-03-01 DIAGNOSIS — M25611 Stiffness of right shoulder, not elsewhere classified: Secondary | ICD-10-CM | POA: Diagnosis not present

## 2020-03-01 DIAGNOSIS — M799 Soft tissue disorder, unspecified: Secondary | ICD-10-CM | POA: Diagnosis not present

## 2020-03-01 DIAGNOSIS — M6281 Muscle weakness (generalized): Secondary | ICD-10-CM | POA: Diagnosis not present

## 2020-03-01 DIAGNOSIS — M1990 Unspecified osteoarthritis, unspecified site: Secondary | ICD-10-CM | POA: Diagnosis not present

## 2020-03-01 DIAGNOSIS — M25612 Stiffness of left shoulder, not elsewhere classified: Secondary | ICD-10-CM | POA: Diagnosis not present

## 2020-03-06 DIAGNOSIS — M1990 Unspecified osteoarthritis, unspecified site: Secondary | ICD-10-CM | POA: Diagnosis not present

## 2020-03-06 DIAGNOSIS — M25512 Pain in left shoulder: Secondary | ICD-10-CM | POA: Diagnosis not present

## 2020-03-06 DIAGNOSIS — M6281 Muscle weakness (generalized): Secondary | ICD-10-CM | POA: Diagnosis not present

## 2020-03-06 DIAGNOSIS — M25611 Stiffness of right shoulder, not elsewhere classified: Secondary | ICD-10-CM | POA: Diagnosis not present

## 2020-03-06 DIAGNOSIS — M25511 Pain in right shoulder: Secondary | ICD-10-CM | POA: Diagnosis not present

## 2020-03-06 DIAGNOSIS — M799 Soft tissue disorder, unspecified: Secondary | ICD-10-CM | POA: Diagnosis not present

## 2020-03-06 DIAGNOSIS — M25612 Stiffness of left shoulder, not elsewhere classified: Secondary | ICD-10-CM | POA: Diagnosis not present

## 2020-03-06 DIAGNOSIS — R293 Abnormal posture: Secondary | ICD-10-CM | POA: Diagnosis not present

## 2020-03-10 DIAGNOSIS — R293 Abnormal posture: Secondary | ICD-10-CM | POA: Diagnosis not present

## 2020-03-10 DIAGNOSIS — M25611 Stiffness of right shoulder, not elsewhere classified: Secondary | ICD-10-CM | POA: Diagnosis not present

## 2020-03-10 DIAGNOSIS — M25612 Stiffness of left shoulder, not elsewhere classified: Secondary | ICD-10-CM | POA: Diagnosis not present

## 2020-03-10 DIAGNOSIS — M799 Soft tissue disorder, unspecified: Secondary | ICD-10-CM | POA: Diagnosis not present

## 2020-03-10 DIAGNOSIS — M25511 Pain in right shoulder: Secondary | ICD-10-CM | POA: Diagnosis not present

## 2020-03-10 DIAGNOSIS — M1990 Unspecified osteoarthritis, unspecified site: Secondary | ICD-10-CM | POA: Diagnosis not present

## 2020-03-10 DIAGNOSIS — M25512 Pain in left shoulder: Secondary | ICD-10-CM | POA: Diagnosis not present

## 2020-03-10 DIAGNOSIS — M6281 Muscle weakness (generalized): Secondary | ICD-10-CM | POA: Diagnosis not present

## 2020-03-13 DIAGNOSIS — M25612 Stiffness of left shoulder, not elsewhere classified: Secondary | ICD-10-CM | POA: Diagnosis not present

## 2020-03-13 DIAGNOSIS — M1990 Unspecified osteoarthritis, unspecified site: Secondary | ICD-10-CM | POA: Diagnosis not present

## 2020-03-13 DIAGNOSIS — M6281 Muscle weakness (generalized): Secondary | ICD-10-CM | POA: Diagnosis not present

## 2020-03-13 DIAGNOSIS — M25512 Pain in left shoulder: Secondary | ICD-10-CM | POA: Diagnosis not present

## 2020-03-13 DIAGNOSIS — M799 Soft tissue disorder, unspecified: Secondary | ICD-10-CM | POA: Diagnosis not present

## 2020-03-13 DIAGNOSIS — R293 Abnormal posture: Secondary | ICD-10-CM | POA: Diagnosis not present

## 2020-03-13 DIAGNOSIS — M25511 Pain in right shoulder: Secondary | ICD-10-CM | POA: Diagnosis not present

## 2020-03-13 DIAGNOSIS — M25611 Stiffness of right shoulder, not elsewhere classified: Secondary | ICD-10-CM | POA: Diagnosis not present

## 2020-03-16 ENCOUNTER — Other Ambulatory Visit (HOSPITAL_COMMUNITY): Payer: Medicare PPO

## 2020-03-16 DIAGNOSIS — M6281 Muscle weakness (generalized): Secondary | ICD-10-CM | POA: Diagnosis not present

## 2020-03-16 DIAGNOSIS — R293 Abnormal posture: Secondary | ICD-10-CM | POA: Diagnosis not present

## 2020-03-16 DIAGNOSIS — M25512 Pain in left shoulder: Secondary | ICD-10-CM | POA: Diagnosis not present

## 2020-03-16 DIAGNOSIS — M25511 Pain in right shoulder: Secondary | ICD-10-CM | POA: Diagnosis not present

## 2020-03-16 DIAGNOSIS — M25611 Stiffness of right shoulder, not elsewhere classified: Secondary | ICD-10-CM | POA: Diagnosis not present

## 2020-03-16 DIAGNOSIS — M799 Soft tissue disorder, unspecified: Secondary | ICD-10-CM | POA: Diagnosis not present

## 2020-03-16 DIAGNOSIS — M25612 Stiffness of left shoulder, not elsewhere classified: Secondary | ICD-10-CM | POA: Diagnosis not present

## 2020-03-16 DIAGNOSIS — M1990 Unspecified osteoarthritis, unspecified site: Secondary | ICD-10-CM | POA: Diagnosis not present

## 2020-03-21 ENCOUNTER — Encounter (HOSPITAL_COMMUNITY): Payer: Self-pay | Admitting: *Deleted

## 2020-03-22 ENCOUNTER — Other Ambulatory Visit (HOSPITAL_COMMUNITY)
Admission: RE | Admit: 2020-03-22 | Discharge: 2020-03-22 | Disposition: A | Discharge status: Home / Self Care | Payer: Medicare PPO | Source: Ambulatory Visit | Attending: Internal Medicine | Admitting: Internal Medicine

## 2020-03-22 ENCOUNTER — Other Ambulatory Visit: Payer: Self-pay

## 2020-03-22 DIAGNOSIS — Z01818 Encounter for other preprocedural examination: Secondary | ICD-10-CM | POA: Diagnosis not present

## 2020-03-22 DIAGNOSIS — Z20822 Contact with and (suspected) exposure to covid-19: Secondary | ICD-10-CM | POA: Insufficient documentation

## 2020-03-23 ENCOUNTER — Ambulatory Visit (HOSPITAL_COMMUNITY): Payer: Medicare PPO

## 2020-03-23 LAB — SARS CORONAVIRUS 2 (TAT 6-24 HRS): SARS Coronavirus 2: NEGATIVE

## 2020-03-24 ENCOUNTER — Ambulatory Visit (HOSPITAL_COMMUNITY): Payer: Medicare PPO | Admitting: Anesthesiology

## 2020-03-24 ENCOUNTER — Encounter (HOSPITAL_COMMUNITY): Payer: Self-pay | Admitting: *Deleted

## 2020-03-24 ENCOUNTER — Encounter (HOSPITAL_COMMUNITY)
Admission: RE | Disposition: A | Discharge status: Home / Self Care | Payer: Self-pay | Source: Home / Self Care | Attending: Internal Medicine

## 2020-03-24 ENCOUNTER — Other Ambulatory Visit: Payer: Self-pay

## 2020-03-24 ENCOUNTER — Ambulatory Visit (HOSPITAL_COMMUNITY)
Admission: RE | Admit: 2020-03-24 | Discharge: 2020-03-24 | Disposition: A | Discharge status: Home / Self Care | Payer: Medicare PPO | Attending: Internal Medicine | Admitting: Internal Medicine

## 2020-03-24 DIAGNOSIS — Z841 Family history of disorders of kidney and ureter: Secondary | ICD-10-CM | POA: Diagnosis not present

## 2020-03-24 DIAGNOSIS — K921 Melena: Secondary | ICD-10-CM | POA: Diagnosis not present

## 2020-03-24 DIAGNOSIS — Z800 Family history of malignant neoplasm of digestive organs: Secondary | ICD-10-CM | POA: Insufficient documentation

## 2020-03-24 DIAGNOSIS — Z87891 Personal history of nicotine dependence: Secondary | ICD-10-CM | POA: Insufficient documentation

## 2020-03-24 DIAGNOSIS — K2980 Duodenitis without bleeding: Secondary | ICD-10-CM | POA: Diagnosis not present

## 2020-03-24 DIAGNOSIS — Z86010 Personal history of colonic polyps: Secondary | ICD-10-CM | POA: Diagnosis not present

## 2020-03-24 DIAGNOSIS — K219 Gastro-esophageal reflux disease without esophagitis: Secondary | ICD-10-CM | POA: Diagnosis not present

## 2020-03-24 DIAGNOSIS — D120 Benign neoplasm of cecum: Secondary | ICD-10-CM

## 2020-03-24 DIAGNOSIS — Z8249 Family history of ischemic heart disease and other diseases of the circulatory system: Secondary | ICD-10-CM | POA: Diagnosis not present

## 2020-03-24 DIAGNOSIS — I129 Hypertensive chronic kidney disease with stage 1 through stage 4 chronic kidney disease, or unspecified chronic kidney disease: Secondary | ICD-10-CM | POA: Diagnosis not present

## 2020-03-24 DIAGNOSIS — Z79899 Other long term (current) drug therapy: Secondary | ICD-10-CM | POA: Diagnosis not present

## 2020-03-24 DIAGNOSIS — Z8711 Personal history of peptic ulcer disease: Secondary | ICD-10-CM | POA: Diagnosis not present

## 2020-03-24 DIAGNOSIS — D123 Benign neoplasm of transverse colon: Secondary | ICD-10-CM | POA: Diagnosis not present

## 2020-03-24 DIAGNOSIS — K3189 Other diseases of stomach and duodenum: Secondary | ICD-10-CM | POA: Insufficient documentation

## 2020-03-24 DIAGNOSIS — K635 Polyp of colon: Secondary | ICD-10-CM

## 2020-03-24 DIAGNOSIS — N189 Chronic kidney disease, unspecified: Secondary | ICD-10-CM | POA: Diagnosis not present

## 2020-03-24 DIAGNOSIS — K2970 Gastritis, unspecified, without bleeding: Secondary | ICD-10-CM | POA: Diagnosis not present

## 2020-03-24 DIAGNOSIS — E785 Hyperlipidemia, unspecified: Secondary | ICD-10-CM | POA: Diagnosis not present

## 2020-03-24 DIAGNOSIS — Z820 Family history of epilepsy and other diseases of the nervous system: Secondary | ICD-10-CM | POA: Insufficient documentation

## 2020-03-24 DIAGNOSIS — K5900 Constipation, unspecified: Secondary | ICD-10-CM | POA: Diagnosis not present

## 2020-03-24 DIAGNOSIS — Z8051 Family history of malignant neoplasm of kidney: Secondary | ICD-10-CM | POA: Insufficient documentation

## 2020-03-24 DIAGNOSIS — K319 Disease of stomach and duodenum, unspecified: Secondary | ICD-10-CM | POA: Insufficient documentation

## 2020-03-24 DIAGNOSIS — Z888 Allergy status to other drugs, medicaments and biological substances status: Secondary | ICD-10-CM | POA: Insufficient documentation

## 2020-03-24 HISTORY — PX: BIOPSY: SHX5522

## 2020-03-24 HISTORY — PX: POLYPECTOMY: SHX5525

## 2020-03-24 HISTORY — PX: ESOPHAGOGASTRODUODENOSCOPY (EGD) WITH PROPOFOL: SHX5813

## 2020-03-24 HISTORY — PX: COLONOSCOPY WITH PROPOFOL: SHX5780

## 2020-03-24 SURGERY — COLONOSCOPY WITH PROPOFOL
Anesthesia: General

## 2020-03-24 MED ORDER — LACTATED RINGERS IV SOLN: Freq: Once | INTRAVENOUS | Status: AC

## 2020-03-24 MED ORDER — LIDOCAINE HCL (CARDIAC) PF 100 MG/5ML IV SOSY
PREFILLED_SYRINGE | INTRAVENOUS | Status: DC | PRN
  Administered 2020-03-24: 50 mg via INTRAVENOUS

## 2020-03-24 MED ORDER — PROPOFOL 500 MG/50ML IV EMUL
INTRAVENOUS | Status: DC | PRN
  Administered 2020-03-24: 150 ug/kg/min via INTRAVENOUS

## 2020-03-24 MED ORDER — OMEPRAZOLE 20 MG PO CPDR: 20.0000 mg | DELAYED_RELEASE_CAPSULE | Freq: Every day | ORAL | 5 refills | Status: DC

## 2020-03-24 MED ORDER — LIDOCAINE VISCOUS HCL 2 % MT SOLN
15.0000 mL | Freq: Once | OROMUCOSAL | Status: AC
  Administered 2020-03-24: 15 mL via OROMUCOSAL

## 2020-03-24 MED ORDER — SPOT INK MARKER SYRINGE KIT
PACK | SUBMUCOSAL | Status: AC
  Filled 2020-03-24: qty 5

## 2020-03-24 MED ORDER — LACTATED RINGERS IV SOLN: INTRAVENOUS | Status: DC | PRN

## 2020-03-24 MED ORDER — PROPOFOL 10 MG/ML IV BOLUS
INTRAVENOUS | Status: DC | PRN
  Administered 2020-03-24: 70 mg via INTRAVENOUS
  Administered 2020-03-24: 50 mg via INTRAVENOUS
  Administered 2020-03-24: 70 mg via INTRAVENOUS
  Administered 2020-03-24 (×2): 30 mg via INTRAVENOUS

## 2020-03-24 MED ORDER — SPOT INK MARKER SYRINGE KIT
PACK | SUBMUCOSAL | Status: DC | PRN
  Administered 2020-03-24: 1.5 mL via SUBMUCOSAL

## 2020-03-24 MED ORDER — LIDOCAINE VISCOUS HCL 2 % MT SOLN
OROMUCOSAL | Status: AC
  Filled 2020-03-24: qty 15

## 2020-03-24 MED ORDER — GLYCOPYRROLATE 0.2 MG/ML IJ SOLN
INTRAMUSCULAR | Status: AC
  Filled 2020-03-24: qty 1

## 2020-03-24 MED ORDER — GLYCOPYRROLATE 0.2 MG/ML IJ SOLN
0.2000 mg | Freq: Once | INTRAMUSCULAR | Status: AC
  Administered 2020-03-24: 0.2 mg via INTRAVENOUS

## 2020-03-24 NOTE — Transfer of Care (Signed)
Immediate Anesthesia Transfer of Care Note  Patient: Mary [name]  Procedure(s) Performed: COLONOSCOPY WITH PROPOFOL (N/A ) ESOPHAGOGASTRODUODENOSCOPY (EGD) WITH PROPOFOL (N/A ) BIOPSY POLYPECTOMY  Patient Location: Endoscopy Unit  Anesthesia Type:General  Level of Consciousness: drowsy  Airway & Oxygen Therapy: Patient Spontanous Breathing  Post-op Assessment: Report given to RN and Post -op Vital signs reviewed and stable  Post vital signs: Reviewed and stable  Last Vitals:  Vitals Value Taken Time  BP    Temp    Pulse    Resp    SpO2      Last Pain:  Vitals:   03/24/20 0857  TempSrc:   PainSc: 3       Patients Stated Pain Goal: 6 (23/34/35 6861)  Complications: No complications documented.

## 2020-03-24 NOTE — Op Note (Signed)
[facility] Patient Name: Mary [name] Procedure Date: 03/24/2020 9:06 AM MRN: [id number] Date of Birth: 1950-05-27 Attending MD: Elon Alas. Edgar Frisk CSN: [id number] Age: 69 Admit Type: Outpatient Procedure:                Colonoscopy Indications:              High risk colon cancer surveillance: Personal                            history of colonic polyps Providers:                Elon Alas. Tahje Borawski, DO, Otis Peak B. Sharon Seller, RN,                            Randa Spike, Technician Referring MD:              Medicines:                See the Anesthesia note for documentation of the                            administered medications Complications:            No immediate complications. Estimated Blood Loss:     Estimated blood loss was minimal. Procedure:                Pre-Anesthesia Assessment:                           - The anesthesia plan was to use monitored                            anesthesia care (MAC).                           After obtaining informed consent, the colonoscope                            was passed under direct vision. Throughout the                            procedure, the patient's blood pressure, pulse, and                            oxygen saturations were monitored continuously. The                            [id number]-[id number]) was introduced through the anus                            and advanced to the the cecum, identified by                            appendiceal orifice and ileocecal valve. The                            quality of the bowel preparation was evaluated  using the BBPS [facility] Bowel Preparation Scale)                            with scores of: Right Colon = 2 (minor amount of                            residual staining, small fragments of stool and/or                            opaque liquid, but mucosa seen well), Transverse                            Colon = 3 (entire mucosa seen well with no  residual                            staining, small fragments of stool or opaque                            liquid) and Left Colon = 3 (entire mucosa seen well                            with no residual staining, small fragments of stool                            or opaque liquid). The total BBPS score equals 8.                            The quality of the bowel preparation was good. Scope In: 9:08:33 AM Scope Out: 10:05:40 AM Scope Withdrawal Time: 0 hours 52 minutes 17 seconds  Total Procedure Duration: 0 hours 57 minutes 7 seconds  Findings:      The perianal and digital rectal examinations were normal.      Two sessile polyps were found in the cecum. The polyps were 2 to 3 mm in       size. These polyps were removed with a cold snare. Resection and       retrieval were complete.      A 30 mm polyp was found in the transverse colon. The polyp was flat.       Preparations were made for mucosal resection. Orise gel was injected       with adequate lift of the lesion from the muscularis propria. Polyp       margins were well demarcated. Piecemeal snare mucosal resection was       performed. Entirwe polyp was resected. Polypectomy margins were then       coagulated with snare tip. Resection and retrieval were complete. There       was no bleeding at the end of the procedure.      A 12 mm polyp was found in the transverse colon. The polyp was flat.       Preparations were made for mucosal resection. Orise gel was injected       with adequate lift of the lesion from the muscularis propria. Margins       were well demarcated. Snare mucosal resection was performed. Resection  and retrieval were complete. There was no bleeding during and at the end       of the procedure. Impression:               - Two 2 to 3 mm polyps in the cecum, removed with a                            cold snare. Resected and retrieved.                           - One 30 mm polyp in the transverse colon,  removed                            with mucosal resection. Resected and retrieved.                           - One 12 mm polyp in the transverse colon, removed                            with mucosal resection. Resected and retrieved.                           - Mucosal resection was performed. Resection and                            retrieval were complete.                           - Mucosal resection was performed. Resection and                            retrieval were complete. Moderate Sedation:      Per Anesthesia Care Recommendation:           - Patient has a contact number available for                            emergencies. The signs and symptoms of potential                            delayed complications were discussed with the                            patient. Return to normal activities tomorrow.                            Written discharge instructions were provided to the                            patient.                           - Resume previous diet.                           - Continue present medications.                           -  Await pathology results.                           - Repeat colonoscopy in 6 months for surveillance.                           - Return to GI clinic in 3 weeks. Procedure Code(s):        --- Professional ---                           [phone], Colonoscopy, flexible; with endoscopic                            mucosal resection                           45385, 58, Colonoscopy, flexible; with removal of                            tumor(s), polyp(s), or other lesion(s) by snare                            technique Diagnosis Code(s):        --- Professional ---                           K63.5, Polyp of colon                           Z86.010, Personal history of colonic polyps CPT copyright 2019 American Medical Association. All rights reserved. The codes documented in this report are preliminary and upon coder review may  be revised  to meet current compliance requirements. Elon Alas. Mary Chatters, DO [place] Mary Chatters, DO 03/24/2020 10:16:21 AM This report has been signed electronically. Number of Addenda: 0

## 2020-03-24 NOTE — Anesthesia Procedure Notes (Signed)
Date/Time: 03/24/2020 9:01 AM Performed by: Orlie Dakin, CRNA Pre-anesthesia Checklist: Patient identified, Emergency Drugs available, Suction available and Patient being monitored Patient Re-evaluated:Patient Re-evaluated prior to induction Oxygen Delivery Method: Nasal cannula Induction Type: IV induction Placement Confirmation: positive ETCO2

## 2020-03-24 NOTE — Interval H&P Note (Signed)
History and Physical Interval Note:  03/24/2020 8:19 AM  Mary [name]  has presented today for surgery, with the diagnosis of melena, hx polyps, gerd.  The various methods of treatment have been discussed with the patient and family. After consideration of risks, benefits and other options for treatment, the patient has consented to  Procedure(s) with comments: COLONOSCOPY WITH PROPOFOL (N/A) - 10:00am ESOPHAGOGASTRODUODENOSCOPY (EGD) WITH PROPOFOL (N/A) as a surgical intervention.  The patient's history has been reviewed, patient examined, no change in status, stable for surgery.  I have reviewed the patient's chart and labs.  Questions were answered to the patient's satisfaction.     Eloise Harman

## 2020-03-24 NOTE — Anesthesia Preprocedure Evaluation (Addendum)
Anesthesia Evaluation  Patient identified by MRN, date of birth, ID band Patient awake    Reviewed: Allergy & Precautions, NPO status , Patient's Chart, lab work & pertinent test results  History of Anesthesia Complications (+) PONV and history of anesthetic complications  Airway Mallampati: II  TM Distance: >3 FB Neck ROM: Full    Dental  (+) Dental Advisory Given, Teeth Intact Root canal:   Pulmonary former smoker,    Pulmonary exam normal breath sounds clear to auscultation       Cardiovascular Exercise Tolerance: Good hypertension (patient BP meds for migraine), Normal cardiovascular exam Rhythm:Regular Rate:Normal     Neuro/Psych  Headaches, Seizures - (last seizure  45 years ago), Well Controlled,     GI/Hepatic GERD  Controlled,  Endo/Other    Renal/GU Renal InsufficiencyRenal disease     Musculoskeletal  (+) Arthritis ,   Abdominal   Peds  Hematology  (+) anemia ,   Anesthesia Other Findings   Reproductive/Obstetrics                            Anesthesia Physical Anesthesia Plan  ASA: II  Anesthesia Plan: General   Post-op Pain Management:    Induction: Intravenous  PONV Risk Score and Plan: TIVA  Airway Management Planned: Nasal Cannula and Natural Airway  Additional Equipment:   Intra-op Plan:   Post-operative Plan:   Informed Consent: I have reviewed the patients History and Physical, chart, labs and discussed the procedure including the risks, benefits and alternatives for the proposed anesthesia with the patient or authorized representative who has indicated his/her understanding and acceptance.     Dental advisory given  Plan Discussed with: CRNA and Surgeon  Anesthesia Plan Comments:         Anesthesia Quick Evaluation

## 2020-03-24 NOTE — Discharge Instructions (Addendum)
EGD Discharge instructions Please read the instructions outlined below and refer to this sheet in the next few weeks. These discharge instructions provide you with general information on caring for yourself after you leave the hospital. Your doctor may also give you specific instructions. While your treatment has been planned according to the most current medical practices available, unavoidable complications occasionally occur. If you have any problems or questions after discharge, please call your doctor. ACTIVITY  You may resume your regular activity but move at a slower pace for the next 24 hours.   Take frequent rest periods for the next 24 hours.   Walking will help expel (get rid of) the air and reduce the bloated feeling in your abdomen.   No driving for 24 hours (because of the anesthesia (medicine) used during the test).   You may shower.   Do not sign any important legal documents or operate any machinery for 24 hours (because of the anesthesia used during the test).  NUTRITION  Drink plenty of fluids.   You may resume your normal diet.   Begin with a light meal and progress to your normal diet.   Avoid alcoholic beverages for 24 hours or as instructed by your caregiver.  MEDICATIONS  You may resume your normal medications unless your caregiver tells you otherwise.  WHAT YOU CAN EXPECT TODAY  You may experience abdominal discomfort such as a feeling of fullness or "gas" pains.  FOLLOW-UP  Your doctor will discuss the results of your test with you.  SEEK IMMEDIATE MEDICAL ATTENTION IF ANY OF THE FOLLOWING OCCUR:  Excessive nausea (feeling sick to your stomach) and/or vomiting.   Severe abdominal pain and distention (swelling).   Trouble swallowing.   Temperature over 101 F (37.8 C).   Rectal bleeding or vomiting of blood.    Colonoscopy Discharge Instructions  Read the instructions outlined below and refer to this sheet in the next few weeks. These  discharge instructions provide you with general information on caring for yourself after you leave the hospital. Your doctor may also give you specific instructions. While your treatment has been planned according to the most current medical practices available, unavoidable complications occasionally occur.   ACTIVITY  You may resume your regular activity, but move at a slower pace for the next 24 hours.   Take frequent rest periods for the next 24 hours.   Walking will help get rid of the air and reduce the bloated feeling in your belly (abdomen).   No driving for 24 hours (because of the medicine (anesthesia) used during the test).    Do not sign any important legal documents or operate any machinery for 24 hours (because of the anesthesia used during the test).  NUTRITION  Drink plenty of fluids.   You may resume your normal diet as instructed by your doctor.   Begin with a light meal and progress to your normal diet. Heavy or fried foods are harder to digest and may make you feel sick to your stomach (nauseated).   Avoid alcoholic beverages for 24 hours or as instructed.  MEDICATIONS  You may resume your normal medications unless your doctor tells you otherwise.  WHAT YOU CAN EXPECT TODAY  Some feelings of bloating in the abdomen.   Passage of more gas than usual.   Spotting of blood in your stool or on the toilet paper.  IF YOU HAD POLYPS REMOVED DURING THE COLONOSCOPY:  No aspirin products for 7 days or as instructed.  No alcohol for 7 days or as instructed.   Eat a soft diet for the next 24 hours.  FINDING OUT THE RESULTS OF YOUR TEST Not all test results are available during your visit. If your test results are not back during the visit, make an appointment with your caregiver to find out the results. Do not assume everything is normal if you have not heard from your caregiver or the medical facility. It is important for you to follow up on all of your test results.   SEEK IMMEDIATE MEDICAL ATTENTION IF:  You have more than a spotting of blood in your stool.   Your belly is swollen (abdominal distention).   You are nauseated or vomiting.   You have a temperature over 101.   You have abdominal pain or discomfort that is severe or gets worse throughout the day.   Your EGD showed inflammation in your stomach.  I biopsied this to rule out infection with H. pylori.  I want you to start taking omeprazole 20 mg daily for the next 8 weeks.  I will send this to your pharmacy.  I also biopsied your small bowel to rule out celiac disease.  Your colonoscopy revealed 5 polyps, 2 of which were quite large requiring in-depth resection.  We will need to repeat colonoscopy in 6 months to ensure that all polyp tissue has been removed.  Given the size of the polyp, you are at increased risk of post polypectomy bleeding.  Keep an eye on your bowel movements and call office or go to the emergency department if you notice blood.  Follow-up with GI in 3 weeks.  I hope you have a great rest of your week!  Elon Alas. Abbey Chatters, D.O. Gastroenterology and Hepatology [facility]   Colon Polyps  Polyps are tissue growths inside the body. Polyps can grow in many places, including the large intestine (colon). A polyp may be a round bump or a mushroom-shaped growth. You could have one polyp or several. Most colon polyps are noncancerous (benign). However, some colon polyps can become cancerous over time. Finding and removing the polyps early can help prevent this. What are the causes? The exact cause of colon polyps is not known. What increases the risk? You are more likely to develop this condition if you:  Have a family history of colon cancer or colon polyps.  Are older than 38 or older than 45 if you are African American.  Have inflammatory bowel disease, such as ulcerative colitis or Crohn's disease.  Have certain hereditary conditions, such  as: ? Familial adenomatous polyposis. ? Lynch syndrome. ? Turcot syndrome. ? Peutz-Jeghers syndrome.  Are overweight.  Smoke cigarettes.  Do not get enough exercise.  Drink too much alcohol.  Eat a diet that is high in fat and red meat and low in fiber.  Had childhood cancer that was treated with abdominal radiation. What are the signs or symptoms? Most polyps do not cause symptoms. If you have symptoms, they may include:  Blood coming from your rectum when having a bowel movement.  Blood in your stool. The stool may look dark red or black.  Abdominal pain.  A change in bowel habits, such as constipation or diarrhea. How is this diagnosed? This condition is diagnosed with a colonoscopy. This is a procedure in which a lighted, flexible scope is inserted into the anus and then passed into the colon to examine the area. Polyps are sometimes found when a colonoscopy is done  as part of routine cancer screening tests. How is this treated? Treatment for this condition involves removing any polyps that are found. Most polyps can be removed during a colonoscopy. Those polyps will then be tested for cancer. Additional treatment may be needed depending on the results of testing. Follow these instructions at home: Lifestyle  Maintain a healthy weight, or lose weight if recommended by your health care provider.  Exercise every day or as told by your health care provider.  Do not use any products that contain nicotine or tobacco, such as cigarettes and e-cigarettes. If you need help quitting, ask your health care provider.  If you drink alcohol, limit how much you have: ? 0-1 drink a day for women. ? 0-2 drinks a day for men.  Be aware of how much alcohol is in your drink. In the [place] one drink equals one 12 oz bottle of beer (355 mL), one 5 oz glass of wine (148 mL), or one 1 oz shot of hard liquor (44 mL). Eating and drinking   Eat foods that are high in fiber, such as fruits,  vegetables, and whole grains.  Eat foods that are high in calcium and vitamin D, such as milk, cheese, yogurt, eggs, liver, fish, and broccoli.  Limit foods that are high in fat, such as fried foods and desserts.  Limit the amount of red meat and processed meat you eat, such as hot dogs, sausage, bacon, and lunch meats. General instructions  Keep all follow-up visits as told by your health care provider. This is important. ? This includes having regularly scheduled colonoscopies. ? Talk to your health care provider about when you need a colonoscopy. Contact a health care provider if:  You have new or worsening bleeding during a bowel movement.  You have new or increased blood in your stool.  You have a change in bowel habits.  You lose weight for no known reason. Summary  Polyps are tissue growths inside the body. Polyps can grow in many places, including the colon.  Most colon polyps are noncancerous (benign), but some can become cancerous over time.  This condition is diagnosed with a colonoscopy.  Treatment for this condition involves removing any polyps that are found. Most polyps can be removed during a colonoscopy. This information is not intended to replace advice given to you by your health care provider. Make sure you discuss any questions you have with your health care provider. Document Revised: 08/14/2017 Document Reviewed: 08/14/2017 Elsevier Patient Education  [place].

## 2020-03-24 NOTE — Op Note (Addendum)
[facility] Patient Name: Mary [name] Procedure Date: 03/24/2020 8:53 AM MRN: [id number] Date of Birth: 21-Aug-1950 Attending MD: Elon Alas. Edgar Frisk CSN: [id number] Age: 69 Admit Type: Outpatient Procedure:                Upper GI endoscopy Indications:              Heartburn, Suspected gastro-esophageal reflux                            disease Providers:                Elon Alas. Rosebud Koenen, DO, Otis Peak B. Sharon Seller, RN,                            Randa Spike, Technician Referring MD:              Medicines:                See the Anesthesia note for documentation of the                            administered medications Complications:            No immediate complications. Estimated Blood Loss:     Estimated blood loss was minimal. Procedure:                Pre-Anesthesia Assessment:                           - The anesthesia plan was to use monitored                            anesthesia care (MAC).                           After obtaining informed consent, the endoscope was                            passed under direct vision. Throughout the                            procedure, the patient's blood pressure, pulse, and                            oxygen saturations were monitored continuously. The                            [id number]-[id number] ([id number]) scope was introduced through the                            mouth, and advanced to the second part of duodenum.                            The upper GI endoscopy was accomplished without                            difficulty. The patient tolerated the procedure  well. Scope In: 8:59:32 AM Scope Out: 9:02:53 AM Total Procedure Duration: 0 hours 3 minutes 21 seconds  Findings:      There is no endoscopic evidence of areas of erosion, esophagitis,       inflammation, ulcerations or varices in the entire esophagus.      Diffuse mild inflammation characterized by erythema was found in the       entire  examined stomach. Biopsies were taken with a cold forceps for       Helicobacter pylori testing.      The duodenal bulb, first portion of the duodenum and second portion of       the duodenum were normal. Biopsies for histology were taken with a cold       forceps for evaluation of celiac disease. Impression:               - Gastritis. Biopsied.                           - Normal duodenal bulb, first portion of the                            duodenum and second portion of the duodenum.                            Biopsied. Moderate Sedation:      Per Anesthesia Care Recommendation:           - Patient has a contact number available for                            emergencies. The signs and symptoms of potential                            delayed complications were discussed with the                            patient. Return to normal activities tomorrow.                            Written discharge instructions were provided to the                            patient.                           - Resume previous diet.                           - Continue present medications.                           - Await pathology results.                           - Use a proton pump inhibitor PO daily for 8 weeks. Procedure Code(s):        --- Professional ---  78469, Esophagogastroduodenoscopy, flexible,                            transoral; with biopsy, single or multiple Diagnosis Code(s):        --- Professional ---                           K29.70, Gastritis, unspecified, without bleeding                           R12, Heartburn CPT copyright 2019 American Medical Association. All rights reserved. The codes documented in this report are preliminary and upon coder review may  be revised to meet current compliance requirements. Elon Alas. Abbey Chatters, DO [place] Abbey Chatters, DO 03/24/2020 9:07:18 AM This report has been signed electronically. Number of Addenda: 0

## 2020-03-24 NOTE — Anesthesia Postprocedure Evaluation (Signed)
Anesthesia Post Note  Patient: Mary [name]  Procedure(s) Performed: COLONOSCOPY WITH PROPOFOL (N/A ) ESOPHAGOGASTRODUODENOSCOPY (EGD) WITH PROPOFOL (N/A ) BIOPSY POLYPECTOMY  Patient location during evaluation: Endoscopy Anesthesia Type: General Level of consciousness: awake and alert and oriented Pain management: pain level controlled Vital Signs Assessment: post-procedure vital signs reviewed and stable Respiratory status: spontaneous breathing, nonlabored ventilation and respiratory function stable Cardiovascular status: blood pressure returned to baseline and stable Postop Assessment: no apparent nausea or vomiting Anesthetic complications: no   No complications documented.   Last Vitals:  Vitals:   03/24/20 0805 03/24/20 1009  BP: (!) 104/59 (!) 118/96  Pulse: 79   Resp: 17 16  Temp: 36.6 C (!) 36.4 C  SpO2: 97% 100%    Last Pain:  Vitals:   03/24/20 1009  TempSrc: Oral  PainSc: 0-No pain                 Orlie Dakin

## 2020-03-27 ENCOUNTER — Other Ambulatory Visit: Payer: Self-pay

## 2020-03-27 LAB — SURGICAL PATHOLOGY

## 2020-03-29 ENCOUNTER — Ambulatory Visit (HOSPITAL_COMMUNITY): Payer: Medicare PPO

## 2020-03-30 ENCOUNTER — Encounter (HOSPITAL_COMMUNITY): Payer: Self-pay | Admitting: Internal Medicine

## 2020-03-31 DIAGNOSIS — Z6824 Body mass index (BMI) 24.0-24.9, adult: Secondary | ICD-10-CM | POA: Diagnosis not present

## 2020-03-31 DIAGNOSIS — H6011 Cellulitis of right external ear: Secondary | ICD-10-CM | POA: Diagnosis not present

## 2020-04-12 ENCOUNTER — Ambulatory Visit: Payer: Medicare PPO | Admitting: Internal Medicine

## 2020-04-12 ENCOUNTER — Encounter: Payer: Self-pay | Admitting: Internal Medicine

## 2020-04-12 ENCOUNTER — Other Ambulatory Visit: Payer: Self-pay

## 2020-04-12 VITALS — BP 107/53 | HR 64 | Temp 96.8°F | Ht 65.0 in | Wt 149.2 lb

## 2020-04-12 DIAGNOSIS — K2970 Gastritis, unspecified, without bleeding: Secondary | ICD-10-CM | POA: Diagnosis not present

## 2020-04-12 DIAGNOSIS — K635 Polyp of colon: Secondary | ICD-10-CM | POA: Diagnosis not present

## 2020-04-12 DIAGNOSIS — K219 Gastro-esophageal reflux disease without esophagitis: Secondary | ICD-10-CM | POA: Diagnosis not present

## 2020-04-12 MED ORDER — OMEPRAZOLE 40 MG PO CPDR: 40.0000 mg | DELAYED_RELEASE_CAPSULE | Freq: Every day | ORAL | 5 refills | Status: DC

## 2020-04-12 NOTE — Progress Notes (Signed)
Referring Provider: Redmond School, MD Primary Care Physician:  Redmond School, MD Primary GI:  Dr. Abbey Chatters  Chief Complaint  Patient presents with  . pp f/u    doing better since taking Omeprazole    HPI:   Mary [name] is a 69 y.o. female who presents to the clinic today for follow-up visit.  She recently underwent colonoscopy which revealed multiple polyps largest of which was a 30 mm sessile serrated polyp status post endoscopic mucosal resection.  She did have multiple other polyps removed which were smaller.  No high-grade dysplasia seen on pathology report.  Patient states she has done well since her procedure.  No rectal bleeding.  No abdominal pain.  EGD also noted gastritis, H. pylori negative.  She has been on omeprazole 20 mg daily which she states is helped immensely but has not completely eradicated her symptoms.  Past Medical History:  Diagnosis Date  . Anemia   . Arthritis   . Chronic kidney disease    idiopathic micro hematuria   . Complication of anesthesia    patient states does not take much medicine to put her to sleep   . Edema    ankles to knees   . GERD (gastroesophageal reflux disease)   . Headache(784.0)    hx of migraines   . Hyperlipidemia   . Mitral valve prolapse    no symptoms   . PONV (postoperative nausea and vomiting)   . Scalp alopecia   . Seizures ([place])    1 seizure 45 years ago; unknown etiology and no meds, no seizures since then.    Past Surgical History:  Procedure Laterality Date  . BIOPSY  03/24/2020   Procedure: BIOPSY;  Surgeon: Eloise Harman, DO;  [facility];  Service: Endoscopy;;  gastric duodenum  . CERVICAL FUSION  2006   cervical 5-6   . COLONOSCOPY N/A 03/03/2017   Procedure: COLONOSCOPY;  Surgeon: Danie Binder, MD;  [facility];  Service: Endoscopy;  Laterality: N/A;  1:15 pm  . COLONOSCOPY WITH PROPOFOL N/A 03/24/2020   Procedure: COLONOSCOPY WITH PROPOFOL;  Surgeon: Eloise Harman, DO;  [facility];  Service: Endoscopy;  Laterality: N/A;  10:00am  . ESOPHAGOGASTRODUODENOSCOPY (EGD) WITH PROPOFOL N/A 03/24/2020   Procedure: ESOPHAGOGASTRODUODENOSCOPY (EGD) WITH PROPOFOL;  Surgeon: Eloise Harman, DO;  [facility];  Service: Endoscopy;  Laterality: N/A;  . HEMORRHOID SURGERY N/A 06/23/2017   Procedure: ANOSCOPY WITH REMOVAL OF SINGLE LESION;  Surgeon: [name], MD;  Location: [facility];  Service: General;  Laterality: N/A;  . MYOCARDIAL PERFUSION STUDY  07/02/2011   NO SCINTIGRAPHIC EVIDENCE OF INDUCIBLE MYOCARDIAL ISCHEMIA. POST-STRESS EF IS 87%.EXERCISE CAPACITY 9 METS. EKG SHOWS NSR AT 69. THERE IS 1MM HORIZONTAL ST DEPRESSION WITH EXERCISE IN II, III, AVF SEEN BEST AT EARLY RECOVERY. THIS IS PRESENT IN V3-V5 AS WELL AND RETURNS TO BASELINE AT THE END OF THE TEST. NORMAL STUDY.  Marland Kitchen POLYPECTOMY  03/24/2020   Procedure: POLYPECTOMY;  Surgeon: Eloise Harman, DO;  [facility];  Service: Endoscopy;;  colon   . RENAL ARTERY DOPPLER  12/28/2003   CELIAC ARTERY: AT REST, 168.4 CM/S; INSPRIATION, 177.6 CM/S. ARCUATE LIGAMENT COMPRESSION SYNDROME. R & L KIDNEYS: RIGHT KIDNEY MEASURES SOME WHAT SMALLER THAN LEFT. KIDNEY ARE ESSENTIALLY SYMMETRICAL IN SHAPE WITH NO OBVIOUS ABNORMALITY VISUALIZED. R & L RENAL ARTERIES: DEMONSTRATE NORMAL SPECTRA. NO SUGGESTION OF DIAMETER REDUCTION, DISSECTION, FIBROMUSCULAR DYSPLASIA OR ANY  OTHER VASCULAR ABNOR  . TOTAL KNEE ARTHROPLASTY Right 08/31/2013   Procedure: RIGHT TOTAL KNEE ARTHROPLASTY;  Surgeon: [name], MD;  Location: [facility];  Service: Orthopedics;  Laterality: Right;  . TOTAL KNEE ARTHROPLASTY Left 03/07/2015   Procedure: TOTAL KNEE ARTHROPLASTY;  Surgeon: [name], MD;  Location: [facility];  Service: Orthopedics;  Laterality: Left;  . TRANSTHORACIC ECHOCARDIOGRAM  7/94/8016   LV SYSTOLIC FUNCTION NORMAL, EF=>55%, INSIGNIFICANT PERICARDIAL EFFUSION, LEFT ATRIAL SIZE IS NORMAL, RV  SYSTOLIC PRESSURE IS NORMAL. NO SIGN VALVULAR HEART DISEASE.  . TUBAL LIGATION  1986  . veins stripping      bilateral legs     Current Outpatient Medications  Medication Sig Dispense Refill  . acetaminophen (TYLENOL) 500 MG tablet Take 500 mg by mouth every 6 (six) hours as needed for headache.     Marland Kitchen amLODipine (NORVASC) 2.5 MG tablet TAKE 1 TABLET BY MOUTH ONCE A DAY. (Patient taking differently: Take 2.5 mg by mouth daily. ) 90 tablet 3  . amoxicillin (AMOXIL) 500 MG capsule Take 2,000 mg by mouth once.     . bisacodyl (DULCOLAX) 5 MG EC tablet Take 5 mg by mouth daily.    . Calcium Citrate 250 MG TABS Take 500 mg by mouth 3 (three) times daily.     . candesartan (ATACAND) 32 MG tablet Take 32 mg by mouth daily.    . Cinnamon 500 MG capsule Take 500 mg by mouth 2 (two) times a week.     . Coenzyme Q10 200 MG capsule Take 200 mg by mouth daily. ubiquinol    . EMGALITY 120 MG/ML SOAJ Inject 120 mg into the skin every 30 (thirty) days.     Marland Kitchen ezetimibe (ZETIA) 10 MG tablet TAKE 1 TABLET BY MOUTH ONCE A DAY. (Patient taking differently: Take 10 mg by mouth daily. ) 90 tablet 3  . Ferrous Sulfate (IRON) 325 (65 Fe) MG TABS Take 325 mg by mouth every evening.     . fexofenadine (ALLEGRA) 180 MG tablet Take 180 mg by mouth daily.    . Glucosamine-Chondroitin 750-600 MG TABS Take 1 tablet by mouth 2 (two) times daily.     Marland Kitchen ipratropium (ATROVENT) 0.06 % nasal spray Place 2 sprays into both nostrils daily.     . Magnesium Gluconate 250 MG TABS Take 250 mg by mouth daily at 12 noon.     . montelukast (SINGULAIR) 10 MG tablet Take 10 mg by mouth at bedtime.    . Multiple Vitamin (MULTIVITAMIN WITH MINERALS) TABS tablet Take 1 tablet by mouth daily. Centrum Silver    . Omega-3 Fatty Acids (FISH OIL) 1200 MG CAPS Take 1,200 mg by mouth in the morning and at bedtime.     Marland Kitchen omeprazole (PRILOSEC) 20 MG capsule Take 1 capsule (20 mg total) by mouth daily. 30 capsule 5  . OVER THE COUNTER MEDICATION Take  4 capsules by mouth in the morning and at bedtime. kidney stuff    . pyridOXINE (VITAMIN B-6) 100 MG tablet Take 100 mg by mouth daily.    . rosuvastatin (CRESTOR) 40 MG tablet TAKE 1 TABLET BY MOUTH AT BEDTIME. (Patient taking differently: Take 40 mg by mouth at bedtime. ) 90 tablet 1  . SUMAtriptan (IMITREX) 100 MG tablet Take 100 mg by mouth every 2 (two) hours as needed for migraine. May repeat in 2 hours if headache persists or recurs.    . SUMAtriptan Succinate Refill (IMITREX STATDOSE REFILL) 4 MG/0.5ML SOCT Inject 4  mg into the skin as needed (for migraines).    . Turmeric 500 MG CAPS Take 500 mg by mouth daily.     No current facility-administered medications for this visit.    Allergies as of 04/12/2020 - Review Complete 04/12/2020  Allergen Reaction Noted  . Metoprolol Rash 02/28/2015  . Sporanox [itraconazole] Swelling and Other (See Comments) 05/20/2013    Family History  Problem Relation Age of Onset  . Dementia Mother   . Heart attack Father   . Heart disease Father   . Kidney failure Father   . Heart attack Brother        age 67  . Cancer - Other Brother        kidney  . Colon cancer Paternal Aunt     Social History   Socioeconomic History  . Marital status: Widowed    Spouse name: Not on file  . Number of children: Not on file  . Years of education: Not on file  . Highest education level: Not on file  Occupational History  . Not on file  Tobacco Use  . Smoking status: Former Smoker    Packs/day: 2.00    Years: 22.00    Pack years: 44.00    Types: Cigarettes    Quit date: 05/13/1988    Years since quitting: 31.9  . Smokeless tobacco: Never Used  Vaping Use  . Vaping Use: Never used  Substance and Sexual Activity  . Alcohol use: Yes    Comment: wine/beer; couple times/month  . Drug use: No  . Sexual activity: Not Currently    Birth control/protection: Post-menopausal  Other Topics Concern  . Not on file  Social History Narrative  . Not on file     Social Determinants of Health   Financial Resource Strain:   . Difficulty of Paying Living Expenses: Not on file  Food Insecurity:   . Worried About Charity fundraiser in the Last Year: Not on file  . Ran Out of Food in the Last Year: Not on file  Transportation Needs:   . Lack of Transportation (Medical): Not on file  . Lack of Transportation (Non-Medical): Not on file  Physical Activity:   . Days of Exercise per Week: Not on file  . Minutes of Exercise per Session: Not on file  Stress:   . Feeling of Stress : Not on file  Social Connections:   . Frequency of Communication with Friends and Family: Not on file  . Frequency of Social Gatherings with Friends and Family: Not on file  . Attends Religious Services: Not on file  . Active Member of Clubs or Organizations: Not on file  . Attends Archivist Meetings: Not on file  . Marital Status: Not on file    Subjective: Review of Systems  Constitutional: Negative for chills and fever.  HENT: Negative for congestion and hearing loss.   Eyes: Negative for blurred vision and double vision.  Respiratory: Negative for cough and shortness of breath.   Cardiovascular: Negative for chest pain and palpitations.  Gastrointestinal: Negative for abdominal pain, blood in stool, constipation, diarrhea, heartburn, melena and vomiting.  Genitourinary: Negative for dysuria and urgency.  Musculoskeletal: Negative for joint pain and myalgias.  Skin: Negative for itching and rash.  Neurological: Negative for dizziness and headaches.  Psychiatric/Behavioral: Negative for depression. The patient is not nervous/anxious.      Objective: BP (!) 107/53   Pulse 64   Temp (!) 96.8 F (36 C) (  Temporal)   Ht 5\' 5"  (1.651 m)   Wt 149 lb 3.2 oz (67.7 kg)   BMI 24.83 kg/m  Physical Exam Constitutional:      Appearance: Normal appearance.  HENT:     Head: Normocephalic and atraumatic.  Eyes:     Extraocular Movements: Extraocular  movements intact.     Conjunctiva/sclera: Conjunctivae normal.  Cardiovascular:     Rate and Rhythm: Normal rate and regular rhythm.  Pulmonary:     Effort: Pulmonary effort is normal.     Breath sounds: Normal breath sounds.  Abdominal:     General: Bowel sounds are normal.     Palpations: Abdomen is soft.  Musculoskeletal:        General: No swelling. Normal range of motion.     Cervical back: Normal range of motion and neck supple.  Skin:    General: Skin is warm and dry.     Coloration: Skin is not jaundiced.  Neurological:     General: No focal deficit present.     Mental Status: She is alert and oriented to person, place, and time.  Psychiatric:        Mood and Affect: Mood normal.        Behavior: Behavior normal.      Assessment: *Sessile serrated polyp-large, 30 mm *Chronic gastritis-improved on omeprazole  Plan: Discussed patient's recent colonoscopy as well as showed her pathology reports.  Given the size of her large sessile serrated polyp, I recommend we repeat colonoscopy in 6 months to ensure complete resection.  She is agreeable.  Increase omeprazole to 40 mg daily for the next 8 weeks and then decrease to 20 mg daily thereafter.  Avoid NSAIDs.  04/12/2020 3:49 PM   Disclaimer: This note was dictated with voice recognition software. Similar sounding words can inadvertently be transcribed and may not be corrected upon review.

## 2020-04-12 NOTE — Patient Instructions (Signed)
Increase your omeprazole to 40 mg daily for the next 8 weeks.  Take 30 minutes before breakfast.  After that time you can decrease to 20 mg daily as tolerated.    We will schedule you for colonoscopy to be performed in May to ensure complete resection of previously noted polyp.  At [facility] we value your feedback. You may receive a survey about your visit today. Please share your experience as we strive to create trusting relationships with our patients to provide genuine, compassionate, quality care.  We appreciate your understanding and patience as we review any laboratory studies, imaging, and other diagnostic tests that are ordered as we care for you. Our office policy is 5 business days for review of these results, and any emergent or urgent results are addressed in a timely manner for your best interest. If you do not hear from our office in 1 week, please contact us.   We also encourage the use of MyChart, which contains your medical information for your review as well. If you are not enrolled in this feature, an access code is on this after visit summary for your convenience. Thank you for allowing [place] to be involved in your care.  It was great to see you today!  I hope you have a great rest of your winter!!    Elon Alas. Abbey Chatters, D.O. Gastroenterology and Hepatology [facility]

## 2020-04-24 ENCOUNTER — Other Ambulatory Visit: Payer: Self-pay

## 2020-04-24 ENCOUNTER — Ambulatory Visit (HOSPITAL_COMMUNITY)
Admission: RE | Admit: 2020-04-24 | Discharge: 2020-04-24 | Disposition: A | Discharge status: Home / Self Care | Payer: Medicare PPO | Source: Ambulatory Visit | Attending: Internal Medicine | Admitting: Internal Medicine

## 2020-04-24 DIAGNOSIS — Z1231 Encounter for screening mammogram for malignant neoplasm of breast: Secondary | ICD-10-CM | POA: Insufficient documentation

## 2020-06-19 ENCOUNTER — Other Ambulatory Visit: Payer: Self-pay | Admitting: Cardiovascular Disease

## 2020-06-20 ENCOUNTER — Other Ambulatory Visit: Payer: Self-pay

## 2020-06-20 ENCOUNTER — Ambulatory Visit (HOSPITAL_COMMUNITY)
Admission: RE | Admit: 2020-06-20 | Discharge: 2020-06-20 | Disposition: A | Discharge status: Home / Self Care | Payer: Medicare PPO | Source: Ambulatory Visit | Attending: Internal Medicine | Admitting: Internal Medicine

## 2020-06-20 ENCOUNTER — Other Ambulatory Visit (HOSPITAL_COMMUNITY): Payer: Self-pay | Admitting: Internal Medicine

## 2020-06-20 DIAGNOSIS — Z6824 Body mass index (BMI) 24.0-24.9, adult: Secondary | ICD-10-CM | POA: Diagnosis not present

## 2020-06-20 DIAGNOSIS — R0609 Other forms of dyspnea: Secondary | ICD-10-CM | POA: Insufficient documentation

## 2020-06-20 DIAGNOSIS — Z1389 Encounter for screening for other disorder: Secondary | ICD-10-CM | POA: Diagnosis not present

## 2020-06-20 DIAGNOSIS — R0602 Shortness of breath: Secondary | ICD-10-CM | POA: Diagnosis not present

## 2020-06-20 DIAGNOSIS — K219 Gastro-esophageal reflux disease without esophagitis: Secondary | ICD-10-CM | POA: Diagnosis not present

## 2020-06-20 DIAGNOSIS — E74818 Other disorders of glucose transport: Secondary | ICD-10-CM | POA: Diagnosis not present

## 2020-06-20 DIAGNOSIS — N1831 Chronic kidney disease, stage 3a: Secondary | ICD-10-CM | POA: Diagnosis not present

## 2020-06-20 DIAGNOSIS — I700 Atherosclerosis of aorta: Secondary | ICD-10-CM | POA: Diagnosis not present

## 2020-06-20 DIAGNOSIS — R0789 Other chest pain: Secondary | ICD-10-CM | POA: Diagnosis not present

## 2020-07-19 ENCOUNTER — Ambulatory Visit: Payer: Medicare PPO | Admitting: Student

## 2020-07-19 ENCOUNTER — Other Ambulatory Visit: Payer: Self-pay

## 2020-07-19 ENCOUNTER — Encounter: Payer: Self-pay | Admitting: Student

## 2020-07-19 VITALS — BP 114/60 | HR 67 | Ht 65.0 in | Wt 149.0 lb

## 2020-07-19 DIAGNOSIS — E7800 Pure hypercholesterolemia, unspecified: Secondary | ICD-10-CM

## 2020-07-19 DIAGNOSIS — I10 Essential (primary) hypertension: Secondary | ICD-10-CM | POA: Diagnosis not present

## 2020-07-19 DIAGNOSIS — R079 Chest pain, unspecified: Secondary | ICD-10-CM

## 2020-07-19 NOTE — Patient Instructions (Addendum)
Medication Instructions:  Your physician recommends that you continue on your current medications as directed. Please refer to the Current Medication list given to you today.  *If you need a refill on your cardiac medications before your next appointment, please call your pharmacy*   Lab Work: CRP/ESR  If you have labs (blood work) drawn today and your tests are completely normal, you will receive your results only by: Marland Kitchen MyChart Message (if you have MyChart) OR . A paper copy in the mail If you have any lab test that is abnormal or we need to change your treatment, we will call you to review the results.   Testing/Procedures: None   Follow-Up: At [facility], you and your health needs are our priority.  As part of our continuing mission to provide you with exceptional heart care, we have created designated Provider Care Teams.  These Care Teams include your primary Cardiologist (physician) and Advanced Practice Providers (APPs -  Physician Assistants and Nurse Practitioners) who all work together to provide you with the care you need, when you need it.  We recommend signing up for the patient portal called "MyChart".  Sign up information is provided on this After Visit Summary.  MyChart is used to connect with patients for Virtual Visits (Telemedicine).  Patients are able to view lab/test results, encounter notes, upcoming appointments, etc.  Non-urgent messages can be sent to your provider as well.   To learn more about what you can do with MyChart, go to [email or web address].    Your next appointment:   Keep follow up with Dr. Claiborne Billings in July.    Other Instructions Please keep track of your blood pressure for 1 month and call our office to report.

## 2020-07-19 NOTE — Progress Notes (Signed)
Cardiology Office Note    Date:  07/19/2020   ID:  Mary [name], DOB 05-Jun-1950, MRN [id number]  PCP:  Redmond School, MD  Cardiologist: Shelva Majestic, MD    Chief Complaint  Patient presents with  . Follow-up    Chest Pain    History of Present Illness:    Mary [name] is a 71 y.o. female with past medical history of chest pain (low-risk NST in 02/2018, Coronary CT in 10/2019 showing a coronary calcium score of 0 but view of RCA was limited secondary to motion artifact but no obvious plaque), HTN, HLD, GERD and family history of CAD who presents to the office today for evaluation of chest pain.   She was last examined by Coletta Memos, NP in 11/2019 for follow-up of her recent Coronary CTA which had demonstrated normal coronary arteries. She was very active at that time and was doing water aerobics three times a week. Continued risk factor modification was recommended.  By review of notes, she did follow-up with GI and underwent a colonoscopy and EGD in 03/2020 with EGD showing gastritis and colonoscopy showing multiple polyps.  In talking with the patient today, she reports having chronic chest wall pain but upon further description it sounds like this actually radiates from the middle of her spinal column bilaterally and comes around to her lower rib cage and has been occurring for several years. Her PCP sent her for a CXR but further imaging such as a CT or MRI were not pursued. Her pain can occur at any point and she is unaware of any specific triggers. She questions if there is possibly an inflammatory component to her symptoms. No reported orthopnea, PND, lower extremity edema or palpitations.  She does bring with her today a very detailed log of symptoms that occurred in 06/2020 along with earlier this month during which she had significant dizziness and weakness. She did review findings with her Neurologist and Amlodipine was discontinued on 07/10/2020. Also reports her  Candesartan was reduced to 16 mg daily. She feels like this has significantly helped with her symptoms.  Past Medical History:  Diagnosis Date  . Anemia   . Arthritis   . Chronic kidney disease    idiopathic micro hematuria   . Complication of anesthesia    patient states does not take much medicine to put her to sleep   . Edema    ankles to knees   . GERD (gastroesophageal reflux disease)   . Headache(784.0)    hx of migraines   . Hyperlipidemia   . Mitral valve prolapse    no symptoms   . PONV (postoperative nausea and vomiting)   . Scalp alopecia   . Seizures ([place])    1 seizure 45 years ago; unknown etiology and no meds, no seizures since then.    Past Surgical History:  Procedure Laterality Date  . BIOPSY  03/24/2020   Procedure: BIOPSY;  Surgeon: Eloise Harman, DO;  [facility];  Service: Endoscopy;;  gastric duodenum  . CERVICAL FUSION  2006   cervical 5-6   . COLONOSCOPY N/A 03/03/2017   Procedure: COLONOSCOPY;  Surgeon: Danie Binder, MD;  [facility];  Service: Endoscopy;  Laterality: N/A;  1:15 pm  . COLONOSCOPY WITH PROPOFOL N/A 03/24/2020   Procedure: COLONOSCOPY WITH PROPOFOL;  Surgeon: Eloise Harman, DO;  [facility];  Service: Endoscopy;  Laterality: N/A;  10:00am  . ESOPHAGOGASTRODUODENOSCOPY (EGD) WITH PROPOFOL  N/A 03/24/2020   Procedure: ESOPHAGOGASTRODUODENOSCOPY (EGD) WITH PROPOFOL;  Surgeon: Eloise Harman, DO;  [facility];  Service: Endoscopy;  Laterality: N/A;  . HEMORRHOID SURGERY N/A 06/23/2017   Procedure: ANOSCOPY WITH REMOVAL OF SINGLE LESION;  Surgeon: [name], MD;  Location: [facility];  Service: General;  Laterality: N/A;  . MYOCARDIAL PERFUSION STUDY  07/02/2011   NO SCINTIGRAPHIC EVIDENCE OF INDUCIBLE MYOCARDIAL ISCHEMIA. POST-STRESS EF IS 87%.EXERCISE CAPACITY 9 METS. EKG SHOWS NSR AT 69. THERE IS 1MM HORIZONTAL ST DEPRESSION WITH EXERCISE IN II, III, AVF SEEN BEST AT EARLY  RECOVERY. THIS IS PRESENT IN V3-V5 AS WELL AND RETURNS TO BASELINE AT THE END OF THE TEST. NORMAL STUDY.  Marland Kitchen POLYPECTOMY  03/24/2020   Procedure: POLYPECTOMY;  Surgeon: Eloise Harman, DO;  [facility];  Service: Endoscopy;;  colon   . RENAL ARTERY DOPPLER  12/28/2003   CELIAC ARTERY: AT REST, 168.4 CM/S; INSPRIATION, 177.6 CM/S. ARCUATE LIGAMENT COMPRESSION SYNDROME. R & L KIDNEYS: RIGHT KIDNEY MEASURES SOME WHAT SMALLER THAN LEFT. KIDNEY ARE ESSENTIALLY SYMMETRICAL IN SHAPE WITH NO OBVIOUS ABNORMALITY VISUALIZED. R & L RENAL ARTERIES: DEMONSTRATE NORMAL SPECTRA. NO SUGGESTION OF DIAMETER REDUCTION, DISSECTION, FIBROMUSCULAR DYSPLASIA OR ANY OTHER VASCULAR ABNOR  . TOTAL KNEE ARTHROPLASTY Right 08/31/2013   Procedure: RIGHT TOTAL KNEE ARTHROPLASTY;  Surgeon: [name], MD;  Location: [facility];  Service: Orthopedics;  Laterality: Right;  . TOTAL KNEE ARTHROPLASTY Left 03/07/2015   Procedure: TOTAL KNEE ARTHROPLASTY;  Surgeon: [name], MD;  Location: [facility];  Service: Orthopedics;  Laterality: Left;  . TRANSTHORACIC ECHOCARDIOGRAM  7/89/3810   LV SYSTOLIC FUNCTION NORMAL, EF=>55%, INSIGNIFICANT PERICARDIAL EFFUSION, LEFT ATRIAL SIZE IS NORMAL, RV SYSTOLIC PRESSURE IS NORMAL. NO SIGN VALVULAR HEART DISEASE.  . TUBAL LIGATION  1986  . veins stripping      bilateral legs     Current Medications: Outpatient Medications Prior to Visit  Medication Sig Dispense Refill  . amoxicillin (AMOXIL) 500 MG capsule Take 2,000 mg by mouth once.     . bisacodyl (DULCOLAX) 5 MG EC tablet Take 5 mg by mouth daily.    . Calcium Citrate 250 MG TABS Take 500 mg by mouth 3 (three) times daily.     . candesartan (ATACAND) 32 MG tablet Take 16 mg by mouth daily.    . Cinnamon 500 MG capsule Take 500 mg by mouth 2 (two) times a week.     . Coenzyme Q10 200 MG capsule Take 200 mg by mouth daily. ubiquinol    . EMGALITY 120 MG/ML SOAJ Inject 120 mg into the skin every 30 (thirty) days.     Marland Kitchen  ezetimibe (ZETIA) 10 MG tablet TAKE 1 TABLET BY MOUTH ONCE A DAY. (Patient taking differently: Take 10 mg by mouth daily.) 90 tablet 3  . Ferrous Sulfate (IRON) 325 (65 Fe) MG TABS Take 325 mg by mouth every evening.     . fexofenadine (ALLEGRA) 180 MG tablet Take 180 mg by mouth daily.    . Glucosamine-Chondroitin 750-600 MG TABS Take 1 tablet by mouth 2 (two) times daily.     Marland Kitchen ipratropium (ATROVENT) 0.06 % nasal spray Place 2 sprays into both nostrils daily.    . Magnesium Gluconate 250 MG TABS Take 250 mg by mouth daily at 12 noon.     . montelukast (SINGULAIR) 10 MG tablet Take 10 mg by mouth at bedtime.    . Multiple Vitamin (MULTIVITAMIN WITH MINERALS) TABS tablet Take 1 tablet  by mouth daily. Centrum Silver    . Omega-3 Fatty Acids (FISH OIL) 1200 MG CAPS Take 1,200 mg by mouth in the morning and at bedtime.     Marland Kitchen omeprazole (PRILOSEC) 40 MG capsule Take 1 capsule (40 mg total) by mouth daily before breakfast. Take 30 minutes before breakfast 30 capsule 5  . OVER THE COUNTER MEDICATION Take 4 capsules by mouth in the morning and at bedtime. kidney stuff    . pyridOXINE (VITAMIN B-6) 100 MG tablet Take 100 mg by mouth daily.    . rosuvastatin (CRESTOR) 40 MG tablet TAKE 1 TABLET BY MOUTH AT BEDTIME. 90 tablet 2  . SUMAtriptan (IMITREX) 100 MG tablet Take 100 mg by mouth every 2 (two) hours as needed for migraine. May repeat in 2 hours if headache persists or recurs.    . SUMAtriptan Succinate Refill 4 MG/0.5ML SOCT Inject 4 mg into the skin as needed (for migraines).    . Turmeric 500 MG CAPS Take 500 mg by mouth daily.    Marland Kitchen acetaminophen (TYLENOL) 500 MG tablet Take 500 mg by mouth every 6 (six) hours as needed for headache.     Marland Kitchen amLODipine (NORVASC) 2.5 MG tablet TAKE 1 TABLET BY MOUTH ONCE A DAY. (Patient taking differently: Take 2.5 mg by mouth daily.) 90 tablet 3   No facility-administered medications prior to visit.     Allergies:   Metoprolol and Sporanox [itraconazole]    Social History   Socioeconomic History  . Marital status: Widowed    Spouse name: Not on file  . Number of children: Not on file  . Years of education: Not on file  . Highest education level: Not on file  Occupational History  . Not on file  Tobacco Use  . Smoking status: Former Smoker    Packs/day: 2.00    Years: 22.00    Pack years: 44.00    Types: Cigarettes    Quit date: 05/13/1988    Years since quitting: 32.2  . Smokeless tobacco: Never Used  Vaping Use  . Vaping Use: Never used  Substance and Sexual Activity  . Alcohol use: Yes    Comment: wine/beer; couple times/month  . Drug use: No  . Sexual activity: Not Currently    Birth control/protection: Post-menopausal  Other Topics Concern  . Not on file  Social History Narrative  . Not on file   Social Determinants of Health   Financial Resource Strain: Not on file  Food Insecurity: Not on file  Transportation Needs: Not on file  Physical Activity: Not on file  Stress: Not on file  Social Connections: Not on file     Family History:  The patient's family history includes Cancer - Other in her brother; Colon cancer in her paternal aunt; Dementia in her mother; Heart attack in her brother and father; Heart disease in her father; Kidney failure in her father.   Review of Systems:   Please see the history of present illness.     General:  No chills, fever, night sweats or weight changes. Positive for dizziness.  Cardiovascular:  No edema, orthopnea, palpitations, paroxysmal nocturnal dyspnea. Positive for chest pain.  Dermatological: No rash, lesions/masses Respiratory: No cough, dyspnea Urologic: No hematuria, dysuria Abdominal:   No nausea, vomiting, diarrhea, bright red blood per rectum, melena, or hematemesis Neurologic:  No visual changes, wkns, changes in mental status. All other systems reviewed and are otherwise negative except as noted above.   Physical Exam:  VS:  BP 114/60   Pulse 67   Ht 5'  5" (1.651 m)   Wt 149 lb (67.6 kg)   SpO2 97%   BMI 24.79 kg/m    General: Well developed, well nourished,female appearing in no acute distress. Head: Normocephalic, atraumatic. Neck: No carotid bruits. JVD not elevated.  Lungs: Respirations regular and unlabored, without wheezes or rales.  Heart: Regular rate and rhythm. No S3 or S4.  No murmur, no rubs, or gallops appreciated. Abdomen: Appears non-distended. No obvious abdominal masses. Msk:  Strength and tone appear normal for age. No obvious joint deformities or effusions. Extremities: No clubbing or cyanosis. No lower extremity edema.  Distal pedal pulses are 2+ bilaterally. Neuro: Alert and oriented X 3. Moves all extremities spontaneously. No focal deficits noted. Psych:  Responds to questions appropriately with a normal affect. Skin: No rashes or lesions noted  Wt Readings from Last 3 Encounters:  07/19/20 149 lb (67.6 kg)  04/12/20 149 lb 3.2 oz (67.7 kg)  03/24/20 142 lb (64.4 kg)     Studies/Labs Reviewed:   EKG:  EKG is ordered today.  The ekg ordered today demonstrates NSR, HR 65 and RAD. No acute ST changes when compared to prior tracings.   Recent Labs: 11/04/2019: BUN 22; Creatinine, Ser 1.21; Potassium 4.5; Sodium 141 01/21/2020: Hemoglobin 13.4; Platelets 204   Lipid Panel    Component Value Date/Time   CHOL 136 07/01/2017 0932   TRIG 49 07/01/2017 0932   HDL 59 07/01/2017 0932   CHOLHDL 2.3 07/01/2017 0932   CHOLHDL 2.3 12/29/2014 0852   VLDL 11 12/29/2014 0852   LDLCALC 67 07/01/2017 0932    Additional studies/ records that were reviewed today include:   Echocardiogram: 02/2018 Study Conclusions   - Left ventricle: The cavity size was normal. Wall thickness was  normal. Systolic function was normal. The estimated ejection  fraction was in the range of 60% to 65%. Wall motion was normal;  there were no regional wall motion abnormalities. Indeterminate  diastolic function.  - Mitral valve:  There was trivial regurgitation.  - Right atrium: Central venous pressure (est): 3 mm Hg.  - Atrial septum: No defect or patent foramen ovale was identified.  - Tricuspid valve: There was trivial regurgitation.  - Pulmonary arteries: Systolic pressure could not be accurately  estimated.  - Pericardium, extracardiac: A prominent pericardial fat pad was  present.   NST: 02/2018  Small, mild intensity, apical anteroseptal defect that is partially reversible, most likely reflective of variable breast attenuation rather than ischemia.  Small, mild intensity, apical to basal inferoseptal defect that is fixed, more prominent at rest in the apical to mid zone. Suspect soft tissue attenuation rather than scar in light of normal wall motion in this territory.  This is a low risk study.  Nuclear stress EF: 67%.    Coronary CT: 10/2019 Coronary Arteries:  Normal coronary origin.  Right dominance.  RCA is a large dominant artery that gives rise to PDA and PLVB. There is no calcified plaque. There is significant motion artifact throughout the RCA that limits ability to detect non-calcified plaque but no obvious plaque is noted after review of all cardiac phases.  Left main is a large artery that gives rise to LAD and LCX arteries. There is no plaque.  LAD is a large vessel that gives rise to 2 large septal perforators and then a small D1. There is no plaque.  LCX is a non-dominant artery that immediately gives rise  to a very small OM1 and then a large OM2 branch. There is no plaque.  Other findings:  Normal pulmonary vein drainage into the left atrium.  Normal let atrial appendage without a thrombus.  Normal size of the pulmonary artery.  IMPRESSION: 1. Coronary calcium score of 0. This was 0 percentile for age and sex matched control.  2. Normal coronary origin with right dominance.  3. No evidence of CAD.  CAD-RADS=0.  4. Consider non-atheroslcerotic causes  of chest pain.  5. The RCA is limited by significant motion artifact but no obvious plaque noted. Could consider Lexiscan myoview to assess [place].  Assessment:    1. Chest pain of uncertain etiology   2. Essential hypertension   3. Pure hypercholesterolemia      Plan:   In order of problems listed above:  1. Chest Pain that is Atypical for a Cardiac Etiology - She reports chest wall pain for several years and it appears this radiates from her thoracic/lumbar spine area into her rib cage and can occur at rest or with activity. Given her Coronary CT last year showed a Coronary Calcium score of 0, I reviewed with her a significant blockage within this time-frame would be very unlikely and her symptoms seem atypical for angina as well. EKG is without acute ST changes.  - Will check an ESR and CRP to rule-out an inflammatory component but feel this is unlikely given the chronicity of her symptoms. Would not recommend the use of NSAIDS given gastritis by recent EGD. I did encourage her to reach out to her PCP or Neurologist to see if more dedicated imaging of her spinal column should be performed.   2. HTN - Her BP is well-controlled at 114/60 during today's visit. Will remain off Amlodipine for now and continue Candesartan 83m daily. She was encouraged to track readings at home and report back if BP remains soft as I suspect this was the likely trigger for her recent dizziness given that symptoms improved with the medication adjustments as outlined above.   3. HLD - Her LDL was well-controlled at 66 when checked in 2021. Continue Zetia 126mdaily, Fish Oil and Crestor 4031maily.     Medication Adjustments/Labs and Tests Ordered: Current medicines are reviewed at length with the patient today.  Concerns regarding medicines are outlined above.  Medication changes, Labs and Tests ordered today are listed in the Patient Instructions below. Patient Instructions  Medication  Instructions:  Your physician recommends that you continue on your current medications as directed. Please refer to the Current Medication list given to you today.  *If you need a refill on your cardiac medications before your next appointment, please call your pharmacy*   Lab Work: CRP/ESR  If you have labs (blood work) drawn today and your tests are completely normal, you will receive your results only by: . MMarland KitchenChart Message (if you have MyChart) OR . A paper copy in the mail If you have any lab test that is abnormal or we need to change your treatment, we will call you to review the results.   Testing/Procedures: None   Follow-Up: At [facility] and your health needs are our priority.  As part of our continuing mission to provide you with exceptional heart care, we have created designated Provider Care Teams.  These Care Teams include your primary Cardiologist (physician) and Advanced Practice Providers (APPs -  Physician Assistants and Nurse Practitioners) who all work together to provide you with the  care you need, when you need it.  We recommend signing up for the patient portal called "MyChart".  Sign up information is provided on this After Visit Summary.  MyChart is used to connect with patients for Virtual Visits (Telemedicine).  Patients are able to view lab/test results, encounter notes, upcoming appointments, etc.  Non-urgent messages can be sent to your provider as well.   To learn more about what you can do with MyChart, go to [email or web address].    Your next appointment:   Keep follow up with Dr. Claiborne Billings in July.    Other Instructions Please keep track of your blood pressure for 1 month and call our office to report.         Signed, Erma Heritage, PA-C  07/19/2020 7:33 PM    [address] [place], [place] [address] Phone: [phone] Fax: [phone]

## 2020-07-20 ENCOUNTER — Ambulatory Visit (HOSPITAL_COMMUNITY)
Admission: RE | Admit: 2020-07-20 | Discharge: 2020-07-20 | Disposition: A | Discharge status: Home / Self Care | Payer: Medicare PPO | Source: Ambulatory Visit | Attending: Family Medicine | Admitting: Family Medicine

## 2020-07-20 ENCOUNTER — Other Ambulatory Visit (HOSPITAL_COMMUNITY): Payer: Self-pay | Admitting: Family Medicine

## 2020-07-20 ENCOUNTER — Other Ambulatory Visit: Payer: Self-pay

## 2020-07-20 ENCOUNTER — Other Ambulatory Visit (HOSPITAL_COMMUNITY)
Admission: RE | Admit: 2020-07-20 | Discharge: 2020-07-20 | Disposition: A | Discharge status: Home / Self Care | Payer: Medicare PPO | Source: Ambulatory Visit | Attending: Student | Admitting: Student

## 2020-07-20 DIAGNOSIS — M25512 Pain in left shoulder: Secondary | ICD-10-CM

## 2020-07-20 DIAGNOSIS — M19012 Primary osteoarthritis, left shoulder: Secondary | ICD-10-CM | POA: Diagnosis not present

## 2020-07-20 DIAGNOSIS — Z6824 Body mass index (BMI) 24.0-24.9, adult: Secondary | ICD-10-CM | POA: Diagnosis not present

## 2020-07-20 DIAGNOSIS — M85812 Other specified disorders of bone density and structure, left shoulder: Secondary | ICD-10-CM | POA: Diagnosis not present

## 2020-07-20 DIAGNOSIS — R079 Chest pain, unspecified: Secondary | ICD-10-CM | POA: Diagnosis not present

## 2020-07-20 DIAGNOSIS — M25712 Osteophyte, left shoulder: Secondary | ICD-10-CM | POA: Diagnosis not present

## 2020-07-20 DIAGNOSIS — L600 Ingrowing nail: Secondary | ICD-10-CM | POA: Diagnosis not present

## 2020-07-20 DIAGNOSIS — I700 Atherosclerosis of aorta: Secondary | ICD-10-CM | POA: Diagnosis not present

## 2020-07-20 LAB — SEDIMENTATION RATE: Sed Rate: 46 mm/hr — ABNORMAL HIGH (ref 0–22)

## 2020-07-21 ENCOUNTER — Telehealth: Payer: Self-pay

## 2020-07-21 LAB — HIGH SENSITIVITY CRP: CRP, High Sensitivity: 1.51 mg/L (ref 0.00–3.00)

## 2020-07-21 NOTE — Telephone Encounter (Signed)
Contacted patient and she verbalized understanding. She had no questions or concerns at this time. Will make a plan to follow up with PCP.

## 2020-07-21 NOTE — Telephone Encounter (Signed)
-----   Message from Erma Heritage, [place] sent at 07/21/2020 12:44 PM EST ----- Please let the patient know that one of her inflammatory markers was mildly elevated but when adjusted for age is not clinically significant. Can sometimes occur with known arthritis or chronic kidney disease. Not elevated enough to suggest myocarditis and her additional inflammatory marker (CRP) was normal, therefore would not recommend using NSAIDS given her gastritis and underlying CKD. Would suggest she still reach out to her PCP to see if more dedicated imaging of her back should be obtained given the reported symptoms at the time of her visit.

## 2020-08-01 ENCOUNTER — Encounter: Payer: Self-pay | Admitting: Internal Medicine

## 2020-08-03 DIAGNOSIS — M25512 Pain in left shoulder: Secondary | ICD-10-CM | POA: Diagnosis not present

## 2020-08-03 DIAGNOSIS — M25511 Pain in right shoulder: Secondary | ICD-10-CM | POA: Diagnosis not present

## 2020-08-21 ENCOUNTER — Ambulatory Visit (INDEPENDENT_AMBULATORY_CARE_PROVIDER_SITE_OTHER): Payer: Self-pay | Admitting: *Deleted

## 2020-08-21 ENCOUNTER — Other Ambulatory Visit: Payer: Self-pay

## 2020-08-21 VITALS — Ht 65.0 in | Wt 147.8 lb

## 2020-08-21 DIAGNOSIS — Z86010 Personal history of colonic polyps: Secondary | ICD-10-CM

## 2020-08-21 MED ORDER — NA SULFATE-K SULFATE-MG SULF 17.5-3.13-1.6 GM/177ML PO SOLN: 1.0000 | Freq: Once | ORAL | 0 refills | Status: AC

## 2020-08-21 NOTE — Progress Notes (Addendum)
Gastroenterology Pre-Procedure Review  Request Date: 08/21/2020 Requesting Physician: 6 mo repeat, Last TCS 03/24/2020 done by Dr. Abbey Chatters, tubular adenoma, multiple fragments of sessile serrated polyp, family hx of colon cancer (aunt)  PATIENT REVIEW QUESTIONS: The patient responded to the following health history questions as indicated:    1. Diabetes Melitis: no 2. Joint replacements in the past 12 months: no 3. Major health problems in the past 3 months: no 4. Has an artificial valve or MVP: yes, diagnosed 10 or more years ago (Dr. Shelva Majestic) 5. Has a defibrillator: no 6. Has been advised in past to take antibiotics in advance of a procedure like teeth cleaning: yes 7. Family history of colon cancer: yes, aunt: age 48's to 65's  8. Alcohol Use: yes, 1 glass of wine monthly 9. Illicit drug Use: no 10. History of sleep apnea: no  11. History of coronary artery or other vascular stents placed within the last 12 months: no 12. History of any prior anesthesia complications: yes, n/v years ago (D&C) and with wisdom teeth extraction 13. Body mass index is 24.6 kg/m.    MEDICATIONS & ALLERGIES:    Patient reports the following regarding taking any blood thinners:   Plavix? no Aspirin? no Coumadin? no Brilinta? no Xarelto? no Eliquis? no Pradaxa? no Savaysa? no Effient? no  Patient confirms/reports the following medications:  Current Outpatient Medications  Medication Sig Dispense Refill  . acetaminophen (TYLENOL) 500 MG tablet Take 500 mg by mouth as needed for headache.    Marland Kitchen amoxicillin (AMOXIL) 500 MG capsule Take 2,000 mg by mouth once.     . bisacodyl (DULCOLAX) 5 MG EC tablet Take 5 mg by mouth daily.    . Calcium Citrate 250 MG TABS Take 500 mg by mouth 3 (three) times daily.     . candesartan (ATACAND) 32 MG tablet Take 16 mg by mouth daily.    . Coenzyme Q10 200 MG capsule Take 200 mg by mouth daily. ubiquinol    . EMGALITY 120 MG/ML SOAJ Inject 120 mg into the skin  every 30 (thirty) days.     Marland Kitchen ezetimibe (ZETIA) 10 MG tablet TAKE 1 TABLET BY MOUTH ONCE A DAY. (Patient taking differently: Take 10 mg by mouth daily.) 90 tablet 3  . Ferrous Sulfate (IRON) 325 (65 Fe) MG TABS Take 325 mg by mouth every evening.     . fexofenadine (ALLEGRA) 180 MG tablet Take 180 mg by mouth daily.    . Glucosamine-Chondroitin 750-600 MG TABS Take 1 tablet by mouth 2 (two) times daily.     Marland Kitchen ipratropium (ATROVENT) 0.06 % nasal spray Place 2 sprays into both nostrils daily.    . Magnesium Gluconate 250 MG TABS Take 250 mg by mouth daily at 12 noon.     . montelukast (SINGULAIR) 10 MG tablet Take 10 mg by mouth at bedtime.    . Multiple Vitamin (MULTIVITAMIN WITH MINERALS) TABS tablet Take 1 tablet by mouth daily. Centrum Silver    . Omega-3 Fatty Acids (FISH OIL) 1200 MG CAPS Take 1,200 mg by mouth in the morning and at bedtime.     Marland Kitchen omeprazole (PRILOSEC) 40 MG capsule Take 1 capsule (40 mg total) by mouth daily before breakfast. Take 30 minutes before breakfast 30 capsule 5  . OVER THE COUNTER MEDICATION Take 4 capsules by mouth in the morning and at bedtime. kidney stuff    . pyridOXINE (VITAMIN B-6) 100 MG tablet Take 100 mg by mouth daily.    Marland Kitchen  rosuvastatin (CRESTOR) 40 MG tablet TAKE 1 TABLET BY MOUTH AT BEDTIME. 90 tablet 2  . SUMAtriptan (IMITREX) 100 MG tablet Take 100 mg by mouth as needed for migraine. May repeat in 2 hours if headache persists or recurs.    . SUMAtriptan Succinate Refill 4 MG/0.5ML SOCT Inject 4 mg into the skin as needed (for migraines).    . Turmeric 500 MG CAPS Take 500 mg by mouth daily.     No current facility-administered medications for this visit.    Patient confirms/reports the following allergies:  Allergies  Allergen Reactions  . Metoprolol Rash  . Sporanox [Itraconazole] Swelling and Other (See Comments)    Bilateral knee and ankle swelling and pain     No orders of the defined types were placed in this  encounter.   AUTHORIZATION INFORMATION Primary Insurance: [place],  [place] #:[id number] Pre-Cert / Josem Kaufmann required: Yes, approved online [id number] Pre-Cert / Auth #: [id number]  SCHEDULE INFORMATION: Procedure has been scheduled as follows:  Date: 10/02/2020, Time: AM procedure Location: [facility] with Dr. Abbey Chatters  This Gastroenterology Pre-Precedure Review Form is being routed to the following provider(s): Roseanne Kaufman, NP

## 2020-08-21 NOTE — Patient Instructions (Addendum)
Mary [name]  Jan 28, 1951 MRN: [id number]    Covid test: 09/29/2020 at 10:30 AM.   Procedure Date:  10/02/2020 Time to register:  You will receive a call from the hospital a few days before your procedure. Place to register: [facility] Scheduled provider: Dr. Marletta Lor    PREPARATION FOR COLONOSCOPY WITH SUPREP BOWEL PREP KIT  Note: Suprep Bowel Prep Kit is a split-dose (2day) regimen. Consumption of BOTH 6-ounce bottles is required for a complete prep.  Please notify us immediately if you are diabetic, take iron supplements, or if you are on Coumadin or any other blood thinners.  Please hold the following medications: Hold Iron 10 days before procedure.                                                                                                                                                   2 DAYS BEFORE PROCEDURE:  DATE: 09/30/2020  DAY: Saturday Begin clear liquid diet AFTER your lunch meal. NO SOLID FOODS after this point.  1 DAY BEFORE PROCEDURE:  DATE: 10/01/2020   DAY: Sunday Continue clear liquids the entire day - NO SOLID FOOD.   Diabetic medications adjustments for today: n/a  At 8:00am: Complete steps 1 through 4 below, using ONE (1) 6-ounce bottle, before going to bed. Step 1:  Pour ONE (1) 6-ounce bottle of SUPREP liquid into the mixing container.  Step 2:  Add cool drinking water to the 16 ounce line on the container and mix.  Note: Dilute the solution concentrate as directed prior to use. Step 3:  DRINK ALL the liquid in the container. Step 4:  You MUST drink an additional two (2) or more 16 ounce containers of water over the next one (1) hour.   At 6:00pm: Complete steps 1 through 4 below, using ONE (1) 6-ounce bottle, before going to bed. Step 1:  Pour ONE (1) 6-ounce bottle of SUPREP liquid into the mixing container.  Step 2:  Add cool drinking water to the 16 ounce line on the container and mix.  Note: Dilute the solution concentrate as directed  prior to use. Step 3:  DRINK ALL the liquid in the container. Step 4:  You MUST drink an additional two (2) or more 16 ounce containers of water over the next one (1) hour.   Continue clear liquids until midnight.  Nothing by mouth after midnight.  DAY OF PROCEDURE:   DATE: 10/02/2020   DAY: Monday If you take medications for your heart, blood pressure, or breathing, you may take these medications.  Diabetic medications adjustments for today: n/a  You may take your morning medications with sip of water unless we have instructed otherwise.    Please see below for Dietary Information.  CLEAR LIQUIDS INCLUDE:  Water Jello (NOT red in color)   Ice Popsicles (NOT  red in color)   Tea (sugar ok, no milk/cream) Powdered fruit flavored drinks  Coffee (sugar ok, no milk/cream) Gatorade/ Lemonade/ Kool-Aid  (NOT red in color)   Juice: apple, white grape, white cranberry Soft drinks  Clear bullion, consomme, broth (fat free beef/chicken/vegetable)  Carbonated beverages (any kind)  Strained chicken noodle soup Hard Candy   Remember: Clear liquids are liquids that will allow you to see your fingers on the other side of a clear glass. Be sure liquids are NOT red in color, and not cloudy, but CLEAR.  DO NOT EAT OR DRINK ANY OF THE FOLLOWING:  Dairy products of any kind   Cranberry juice Tomato juice / V8 juice   Grapefruit juice Orange juice     Red grape juice  Do not eat any solid foods, including such foods as: cereal, oatmeal, yogurt, fruits, vegetables, creamed soups, eggs, bread, crackers, pureed foods in a blender, etc.   HELPFUL HINTS FOR DRINKING PREP SOLUTION:   Make sure prep is extremely cold. Mix and refrigerate the the morning of the prep. You may also put in the freezer.   You may try mixing some Crystal Light or Country Time Lemonade if you prefer. Mix in small amounts; add more if necessary.  Try drinking through a straw  Rinse mouth with water or a mouthwash between  glasses, to remove after-taste.  Try sipping on a cold beverage /ice/ popsicles between glasses of prep.  Place a piece of sugar-free hard candy in mouth between glasses.  If you become nauseated, try consuming smaller amounts, or stretch out the time between glasses. Stop for 30-60 minutes, then slowly start back drinking.     OTHER INSTRUCTIONS  You will need a responsible adult at least 72 years of age to accompany you and drive you home. This person must remain in the waiting room during your procedure. The hospital will cancel your procedure if you do not have a responsible adult with you.   1. Wear loose fitting clothing that is easily removed. 2. Leave jewelry and other valuables at home.  3. Remove all body piercing jewelry and leave at home. 4. Total time from sign-in until discharge is approximately 2-3 hours. 5. You should go home directly after your procedure and rest. You can resume normal activities the day after your procedure. 6. The day of your procedure you should not:  Drive  Make legal decisions  Operate machinery  Drink alcohol  Return to work   You may call the office (Dept: [phone]) before 5:00pm, or page the doctor on call [phone]) after 5:00pm, for further instructions, if necessary.   Insurance Information YOU WILL NEED TO CHECK WITH YOUR INSURANCE COMPANY FOR THE BENEFITS OF COVERAGE YOU HAVE FOR THIS PROCEDURE.  UNFORTUNATELY, NOT ALL INSURANCE COMPANIES HAVE BENEFITS TO COVER ALL OR PART OF THESE TYPES OF PROCEDURES.  IT IS YOUR RESPONSIBILITY TO CHECK YOUR BENEFITS, HOWEVER, WE WILL BE GLAD TO ASSIST YOU WITH ANY CODES YOUR INSURANCE COMPANY MAY NEED.    PLEASE NOTE THAT MOST INSURANCE COMPANIES WILL NOT COVER A SCREENING COLONOSCOPY FOR PEOPLE UNDER THE AGE OF 50  IF YOU HAVE BCBS INSURANCE, YOU MAY HAVE BENEFITS FOR A SCREENING COLONOSCOPY BUT IF POLYPS ARE FOUND THE DIAGNOSIS WILL CHANGE AND THEN YOU MAY HAVE A DEDUCTIBLE THAT WILL  NEED TO BE MET. SO PLEASE MAKE SURE YOU CHECK YOUR BENEFITS FOR A SCREENING COLONOSCOPY AS WELL AS A DIAGNOSTIC COLONOSCOPY.

## 2020-08-22 ENCOUNTER — Other Ambulatory Visit: Payer: Self-pay | Admitting: General Practice

## 2020-08-25 ENCOUNTER — Other Ambulatory Visit: Payer: Self-pay | Admitting: Student

## 2020-08-25 MED ORDER — CANDESARTAN CILEXETIL 8 MG PO TABS: 8.0000 mg | ORAL_TABLET | Freq: Every day | ORAL | 1 refills | Status: DC

## 2020-08-28 NOTE — Progress Notes (Signed)
Appropriate. ASA 2.  

## 2020-08-29 NOTE — Progress Notes (Signed)
Does pt need to hold Iron 10 days before procedure?

## 2020-08-29 NOTE — Progress Notes (Signed)
I believe it is 7 days? Yes, hold iron prior for at least 7 days.

## 2020-08-30 ENCOUNTER — Encounter: Payer: Self-pay | Admitting: *Deleted

## 2020-08-30 NOTE — Progress Notes (Signed)
Mailed letter to pt with Iron medication adjustments.

## 2020-08-31 ENCOUNTER — Other Ambulatory Visit: Payer: Self-pay | Admitting: *Deleted

## 2020-08-31 DIAGNOSIS — H43812 Vitreous degeneration, left eye: Secondary | ICD-10-CM | POA: Diagnosis not present

## 2020-09-25 DIAGNOSIS — J343 Hypertrophy of nasal turbinates: Secondary | ICD-10-CM | POA: Diagnosis not present

## 2020-09-25 DIAGNOSIS — R0982 Postnasal drip: Secondary | ICD-10-CM | POA: Diagnosis not present

## 2020-09-25 DIAGNOSIS — J310 Chronic rhinitis: Secondary | ICD-10-CM | POA: Diagnosis not present

## 2020-09-28 ENCOUNTER — Telehealth: Payer: Self-pay | Admitting: Internal Medicine

## 2020-09-28 NOTE — Telephone Encounter (Signed)
Melanie from [facility] called asking for Durward Fortes, RMA and I told her that she was out until Monday. She asked for Mindy or Tretha Sciara. I told her they were at lunch. She said she can't get in touch with patient and she's scheduled for Monday with Dr Abbey Chatters.

## 2020-09-28 NOTE — Telephone Encounter (Signed)
Spoke to [place], she was unable to reach pt to give her arrival time for TCS 10/02/20. She needs to arrive at 7:30am.  Called pt, she already knew to arrive at 7:30am for TCS. She asked if she was going to get instructions in mail for procedure. Informed her triage nurse would have given her instructions at nurse visit. She has access to MyChart. Instructions sent to her via [facility].

## 2020-09-29 ENCOUNTER — Other Ambulatory Visit (HOSPITAL_COMMUNITY)
Admission: RE | Admit: 2020-09-29 | Discharge: 2020-09-29 | Disposition: A | Discharge status: Home / Self Care | Payer: Medicare PPO | Source: Ambulatory Visit | Attending: Internal Medicine | Admitting: Internal Medicine

## 2020-09-29 ENCOUNTER — Other Ambulatory Visit: Payer: Self-pay

## 2020-09-29 DIAGNOSIS — Z01812 Encounter for preprocedural laboratory examination: Secondary | ICD-10-CM | POA: Diagnosis not present

## 2020-09-29 DIAGNOSIS — Z20822 Contact with and (suspected) exposure to covid-19: Secondary | ICD-10-CM | POA: Insufficient documentation

## 2020-09-29 LAB — SARS CORONAVIRUS 2 (TAT 6-24 HRS): SARS Coronavirus 2: NEGATIVE

## 2020-10-02 ENCOUNTER — Ambulatory Visit (HOSPITAL_COMMUNITY): Payer: Medicare PPO | Admitting: Anesthesiology

## 2020-10-02 ENCOUNTER — Encounter (HOSPITAL_COMMUNITY): Payer: Self-pay

## 2020-10-02 ENCOUNTER — Encounter (HOSPITAL_COMMUNITY)
Admission: RE | Disposition: A | Discharge status: Home / Self Care | Payer: Self-pay | Source: Home / Self Care | Attending: Internal Medicine

## 2020-10-02 ENCOUNTER — Ambulatory Visit (HOSPITAL_COMMUNITY)
Admission: RE | Admit: 2020-10-02 | Discharge: 2020-10-02 | Disposition: A | Discharge status: Home / Self Care | Payer: Medicare PPO | Attending: Internal Medicine | Admitting: Internal Medicine

## 2020-10-02 ENCOUNTER — Other Ambulatory Visit: Payer: Self-pay

## 2020-10-02 DIAGNOSIS — D120 Benign neoplasm of cecum: Secondary | ICD-10-CM | POA: Diagnosis not present

## 2020-10-02 DIAGNOSIS — K635 Polyp of colon: Secondary | ICD-10-CM

## 2020-10-02 DIAGNOSIS — Z86010 Personal history of colonic polyps: Secondary | ICD-10-CM

## 2020-10-02 DIAGNOSIS — Z8051 Family history of malignant neoplasm of kidney: Secondary | ICD-10-CM | POA: Diagnosis not present

## 2020-10-02 DIAGNOSIS — I129 Hypertensive chronic kidney disease with stage 1 through stage 4 chronic kidney disease, or unspecified chronic kidney disease: Secondary | ICD-10-CM | POA: Insufficient documentation

## 2020-10-02 DIAGNOSIS — Z87891 Personal history of nicotine dependence: Secondary | ICD-10-CM | POA: Insufficient documentation

## 2020-10-02 DIAGNOSIS — D649 Anemia, unspecified: Secondary | ICD-10-CM | POA: Diagnosis not present

## 2020-10-02 DIAGNOSIS — Z888 Allergy status to other drugs, medicaments and biological substances status: Secondary | ICD-10-CM | POA: Diagnosis not present

## 2020-10-02 DIAGNOSIS — Z800 Family history of malignant neoplasm of digestive organs: Secondary | ICD-10-CM | POA: Diagnosis not present

## 2020-10-02 DIAGNOSIS — Z79899 Other long term (current) drug therapy: Secondary | ICD-10-CM | POA: Diagnosis not present

## 2020-10-02 DIAGNOSIS — K648 Other hemorrhoids: Secondary | ICD-10-CM | POA: Diagnosis not present

## 2020-10-02 DIAGNOSIS — N189 Chronic kidney disease, unspecified: Secondary | ICD-10-CM | POA: Insufficient documentation

## 2020-10-02 DIAGNOSIS — Z841 Family history of disorders of kidney and ureter: Secondary | ICD-10-CM | POA: Insufficient documentation

## 2020-10-02 DIAGNOSIS — Z1211 Encounter for screening for malignant neoplasm of colon: Secondary | ICD-10-CM | POA: Diagnosis not present

## 2020-10-02 DIAGNOSIS — Z8249 Family history of ischemic heart disease and other diseases of the circulatory system: Secondary | ICD-10-CM | POA: Insufficient documentation

## 2020-10-02 DIAGNOSIS — K219 Gastro-esophageal reflux disease without esophagitis: Secondary | ICD-10-CM | POA: Insufficient documentation

## 2020-10-02 HISTORY — PX: POLYPECTOMY: SHX5525

## 2020-10-02 HISTORY — PX: COLONOSCOPY WITH PROPOFOL: SHX5780

## 2020-10-02 SURGERY — COLONOSCOPY WITH PROPOFOL
Anesthesia: General

## 2020-10-02 MED ORDER — LACTATED RINGERS IV SOLN: INTRAVENOUS | Status: DC

## 2020-10-02 MED ORDER — PROPOFOL 10 MG/ML IV BOLUS
INTRAVENOUS | Status: DC | PRN
  Administered 2020-10-02: 20 mg via INTRAVENOUS
  Administered 2020-10-02: 30 mg via INTRAVENOUS
  Administered 2020-10-02: 10 mg via INTRAVENOUS
  Administered 2020-10-02 (×3): 20 mg via INTRAVENOUS

## 2020-10-02 MED ORDER — PROPOFOL 10 MG/ML IV BOLUS
INTRAVENOUS | Status: AC
  Filled 2020-10-02: qty 40

## 2020-10-02 MED ORDER — PROPOFOL 500 MG/50ML IV EMUL
INTRAVENOUS | Status: DC | PRN
  Administered 2020-10-02: 150 ug/kg/min via INTRAVENOUS

## 2020-10-02 NOTE — Anesthesia Preprocedure Evaluation (Addendum)
Anesthesia Evaluation  Patient identified by MRN, date of birth, ID band Patient awake    Reviewed: Allergy & Precautions, NPO status , Patient's Chart, lab work & pertinent test results  History of Anesthesia Complications (+) PONV and history of anesthetic complications  Airway Mallampati: II  TM Distance: >3 FB Neck ROM: Full    Dental  (+) Dental Advisory Given, Teeth Intact Root canal:   Pulmonary former smoker,    Pulmonary exam normal breath sounds clear to auscultation       Cardiovascular Exercise Tolerance: Good hypertension (patient BP meds for migraine), Normal cardiovascular exam Rhythm:Regular Rate:Normal     Neuro/Psych  Headaches, Seizures - (last seizure  45 years ago), Well Controlled,     GI/Hepatic GERD  Controlled,  Endo/Other    Renal/GU Renal InsufficiencyRenal disease     Musculoskeletal  (+) Arthritis ,   Abdominal   Peds  Hematology  (+) anemia ,   Anesthesia Other Findings   Reproductive/Obstetrics                             Anesthesia Physical  Anesthesia Plan  ASA: II  Anesthesia Plan: General   Post-op Pain Management:    Induction: Intravenous  PONV Risk Score and Plan: Propofol infusion  Airway Management Planned: Nasal Cannula and Natural Airway  Additional Equipment:   Intra-op Plan:   Post-operative Plan:   Informed Consent: I have reviewed the patients History and Physical, chart, labs and discussed the procedure including the risks, benefits and alternatives for the proposed anesthesia with the patient or authorized representative who has indicated his/her understanding and acceptance.     Dental advisory given  Plan Discussed with: CRNA and Surgeon  Anesthesia Plan Comments:         Anesthesia Quick Evaluation

## 2020-10-02 NOTE — Op Note (Signed)
[facility] Patient Name: Mary [name] Procedure Date: 10/02/2020 8:32 AM MRN: [id number] Date of Birth: 10-04-1950 Attending MD: Elon Alas. Abbey Chatters DO CSN: [id number] Age: 70 Admit Type: Outpatient Procedure:                Colonoscopy Indications:              High risk colon cancer surveillance: Personal                            history of sessile serrated colon polyp (10 mm or                            greater in size) Providers:                Elon Alas. Abbey Chatters, DO, Gwenlyn Fudge, RN, Aram Candela Referring MD:              Medicines:                See the Anesthesia note for documentation of the                            administered medications Complications:            No immediate complications. Estimated Blood Loss:     Estimated blood loss was minimal. Procedure:                Pre-Anesthesia Assessment:                           - The anesthesia plan was to use monitored                            anesthesia care (MAC).                           After obtaining informed consent, the colonoscope                            was passed under direct vision. Throughout the                            procedure, the patient's blood pressure, pulse, and                            oxygen saturations were monitored continuously. The                            [id number]) was introduced through the anus                            and advanced to the the cecum, identified by                            appendiceal orifice and ileocecal valve. The  colonoscopy was performed without difficulty. The                            patient tolerated the procedure well. The quality                            of the bowel preparation was evaluated using the                            BBPS [facility] Bowel Preparation Scale) with scores                            of: Right Colon = 2 (minor amount of residual                             staining, small fragments of stool and/or opaque                            liquid, but mucosa seen well), Transverse Colon = 2                            (minor amount of residual staining, small fragments                            of stool and/or opaque liquid, but mucosa seen                            well) and Left Colon = 2 (minor amount of residual                            staining, small fragments of stool and/or opaque                            liquid, but mucosa seen well). The total BBPS score                            equals 6. The quality of the bowel preparation was                            fair. Scope In: 9:09:21 AM Scope Out: 9:37:58 AM Scope Withdrawal Time: 0 hours 19 minutes 16 seconds  Total Procedure Duration: 0 hours 28 minutes 37 seconds  Findings:      The perianal and digital rectal examinations were normal.      Non-bleeding internal hemorrhoids were found during endoscopy.      A 2 mm polyp was found in the cecum. The polyp was sessile. The polyp       was removed with a cold biopsy forceps. Resection and retrieval were       complete.      A 30 mm post polypectomy scar was found in the transverse colon with       distal tatoo. There was no evidence of the previous polyp.      A moderate amount of stool was found  in the ascending colon and in the       cecum, making visualization difficult. Lavage of the area was performed       using a large amount of sterile water, resulting in clearance with fair       visualization. Impression:               - Preparation of the colon was fair.                           - Non-bleeding internal hemorrhoids.                           - One 2 mm polyp in the cecum, removed with a cold                            biopsy forceps. Resected and retrieved.                           - Post-polypectomy scar in the transverse colon.                           - Stool in the ascending colon and in the cecum. Moderate  Sedation:      Per Anesthesia Care Recommendation:           - Patient has a contact number available for                            emergencies. The signs and symptoms of potential                            delayed complications were discussed with the                            patient. Return to normal activities tomorrow.                            Written discharge instructions were provided to the                            patient.                           - Resume previous diet.                           - Continue present medications.                           - Await pathology results.                           - Repeat colonoscopy in 3 years for surveillance.                           - Return to GI clinic PRN. Procedure Code(s):        --- Professional ---  45380, Colonoscopy, flexible; with biopsy, single                            or multiple Diagnosis Code(s):        --- Professional ---                           Z86.010, Personal history of colonic polyps                           K64.8, Other hemorrhoids                           K63.5, Polyp of colon                           Z98.890, Other specified postprocedural states CPT copyright 2019 American Medical Association. All rights reserved. The codes documented in this report are preliminary and upon coder review may  be revised to meet current compliance requirements. Elon Alas. Abbey Chatters, DO [place] Abbey Chatters, DO 10/02/2020 9:45:56 AM This report has been signed electronically. Number of Addenda: 0

## 2020-10-02 NOTE — Transfer of Care (Signed)
Immediate Anesthesia Transfer of Care Note  Patient: Mary [name]  Procedure(s) Performed: COLONOSCOPY WITH PROPOFOL (N/A ) POLYPECTOMY  Patient Location: Short Stay  Anesthesia Type:General  Level of Consciousness: awake  Airway & Oxygen Therapy: Patient Spontanous Breathing  Post-op Assessment: Report given to RN  Post vital signs: Reviewed and stable  Last Vitals:  Vitals Value Taken Time  BP    Temp    Pulse    Resp    SpO2      Last Pain:  Vitals:   10/02/20 0905  TempSrc:   PainSc: 0-No pain      Patients Stated Pain Goal: 5 (96/04/54 0981)  Complications: No complications documented.

## 2020-10-02 NOTE — Anesthesia Postprocedure Evaluation (Signed)
Anesthesia Post Note  Patient: Mary [name]  Procedure(s) Performed: COLONOSCOPY WITH PROPOFOL (N/A ) POLYPECTOMY  Patient location during evaluation: Endoscopy Anesthesia Type: General Level of consciousness: awake and alert Pain management: pain level controlled Vital Signs Assessment: post-procedure vital signs reviewed and stable Respiratory status: spontaneous breathing Cardiovascular status: blood pressure returned to baseline and stable Postop Assessment: no apparent nausea or vomiting Anesthetic complications: no   No complications documented.   Last Vitals:  Vitals:   10/02/20 0802 10/02/20 0942  BP: 131/70 (!) 103/56  Pulse: 64 64  Resp: 18 19  Temp: 36.7 C 36.4 C  SpO2: 97% 97%    Last Pain:  Vitals:   10/02/20 0942  TempSrc:   PainSc: 0-No pain                 Trudy Kory

## 2020-10-02 NOTE — Discharge Instructions (Addendum)
Colonoscopy Discharge Instructions  Read the instructions outlined below and refer to this sheet in the next few weeks. These discharge instructions provide you with general information on caring for yourself after you leave the hospital. Your doctor may also give you specific instructions. While your treatment has been planned according to the most current medical practices available, unavoidable complications occasionally occur.   ACTIVITY  You may resume your regular activity, but move at a slower pace for the next 24 hours.   Take frequent rest periods for the next 24 hours.   Walking will help get rid of the air and reduce the bloated feeling in your belly (abdomen).   No driving for 24 hours (because of the medicine (anesthesia) used during the test).    Do not sign any important legal documents or operate any machinery for 24 hours (because of the anesthesia used during the test).  NUTRITION  Drink plenty of fluids.   You may resume your normal diet as instructed by your doctor.   Begin with a light meal and progress to your normal diet. Heavy or fried foods are harder to digest and may make you feel sick to your stomach (nauseated).   Avoid alcoholic beverages for 24 hours or as instructed.  MEDICATIONS  You may resume your normal medications unless your doctor tells you otherwise.  WHAT YOU CAN EXPECT TODAY  Some feelings of bloating in the abdomen.   Passage of more gas than usual.   Spotting of blood in your stool or on the toilet paper.  IF YOU HAD POLYPS REMOVED DURING THE COLONOSCOPY:  No aspirin products for 7 days or as instructed.   No alcohol for 7 days or as instructed.   Eat a soft diet for the next 24 hours.  FINDING OUT THE RESULTS OF YOUR TEST Not all test results are available during your visit. If your test results are not back during the visit, make an appointment with your caregiver to find out the results. Do not assume everything is normal if  you have not heard from your caregiver or the medical facility. It is important for you to follow up on all of your test results.  SEEK IMMEDIATE MEDICAL ATTENTION IF:  You have more than a spotting of blood in your stool.   Your belly is swollen (abdominal distention).   You are nauseated or vomiting.   You have a temperature over 101.   You have abdominal pain or discomfort that is severe or gets worse throughout the day.   Your colonoscopy revealed 1 very small polyp which I removed successfully.  I was able to find the site of large polyp removed previously. This Did appear healthy without residual polyp tissue.  We will tentatively plan on repeat colonoscopy in 3 years for surveillance purposes.  Otherwise follow-up with GI as needed.  I hope you have a great rest of your week!  Elon Alas. Abbey Chatters, D.O. Gastroenterology and Hepatology [facility]   Colon Polyps  Colon polyps are tissue growths inside the colon, which is part of the large intestine. They are one of the types of polyps that can grow in the body. A polyp may be a round bump or a mushroom-shaped growth. You could have one polyp or more than one. Most colon polyps are noncancerous (benign). However, some colon polyps can become cancerous over time. Finding and removing the polyps early can help prevent this. What are the causes? The exact cause of colon  polyps is not known. What increases the risk? The following factors may make you more likely to develop this condition:  Having a family history of colorectal cancer or colon polyps.  Being older than 72 years of age.  Being younger than 72 years of age and having a significant family history of colorectal cancer or colon polyps or a genetic condition that puts you at higher risk of getting colon polyps.  Having inflammatory bowel disease, such as ulcerative colitis or Crohn's disease.  Having certain conditions passed from parent to  child (hereditary conditions), such as: ? Familial adenomatous polyposis (FAP). ? Lynch syndrome. ? Turcot syndrome. ? Peutz-Jeghers syndrome. ? MUTYH-associated polyposis (MAP).  Being overweight.  Certain lifestyle factors. These include smoking cigarettes, drinking too much alcohol, not getting enough exercise, and eating a diet that is high in fat and red meat and low in fiber.  Having had childhood cancer that was treated with radiation of the abdomen. What are the signs or symptoms? Many times, there are no symptoms. If you have symptoms, they may include:  Blood coming from the rectum during a bowel movement.  Blood in the stool (feces). The blood may be bright red or very dark in color.  Pain in the abdomen.  A change in bowel habits, such as constipation or diarrhea. How is this diagnosed? This condition is diagnosed with a colonoscopy. This is a procedure in which a lighted, flexible scope is inserted into the opening between the buttocks (anus) and then passed into the colon to examine the area. Polyps are sometimes found when a colonoscopy is done as part of routine cancer screening tests. How is this treated? This condition is treated by removing any polyps that are found. Most polyps can be removed during a colonoscopy. Those polyps will then be tested for cancer. Additional treatment may be needed depending on the results of testing. Follow these instructions at home: Eating and drinking  Eat foods that are high in fiber, such as fruits, vegetables, and whole grains.  Eat foods that are high in calcium and vitamin D, such as milk, cheese, yogurt, eggs, liver, fish, and broccoli.  Limit foods that are high in fat, such as fried foods and desserts.  Limit the amount of red meat, precooked or cured meat, or other processed meat that you eat, such as hot dogs, sausages, bacon, or meat loaves.  Limit sugary drinks.   Lifestyle  Maintain a healthy weight, or lose  weight if recommended by your health care provider.  Exercise every day or as told by your health care provider.  Do not use any products that contain nicotine or tobacco, such as cigarettes, e-cigarettes, and chewing tobacco. If you need help quitting, ask your health care provider.  Do not drink alcohol if: ? Your health care provider tells you not to drink. ? You are pregnant, may be pregnant, or are planning to become pregnant.  If you drink alcohol: ? Limit how much you use to:  0-1 drink a day for women.  0-2 drinks a day for men. ? Know how much alcohol is in your drink. In the [place] one drink equals one 12 oz bottle of beer (355 mL), one 5 oz glass of wine (148 mL), or one 1 oz glass of hard liquor (44 mL). General instructions  Take over-the-counter and prescription medicines only as told by your health care provider.  Keep all follow-up visits. This is important. This includes having regularly scheduled colonoscopies. Talk  to your health care provider about when you need a colonoscopy. Contact a health care provider if:  You have new or worsening bleeding during a bowel movement.  You have new or increased blood in your stool.  You have a change in bowel habits.  You lose weight for no known reason. Summary  Colon polyps are tissue growths inside the colon, which is part of the large intestine. They are one type of polyp that can grow in the body.  Most colon polyps are noncancerous (benign), but some can become cancerous over time.  This condition is diagnosed with a colonoscopy.  This condition is treated by removing any polyps that are found. Most polyps can be removed during a colonoscopy. This information is not intended to replace advice given to you by your health care provider. Make sure you discuss any questions you have with your health care provider. Document Revised: 08/18/2019 Document Reviewed: 08/18/2019 Elsevier Patient Education  2021 [employer].

## 2020-10-02 NOTE — H&P (Signed)
Primary Care Physician:  Redmond School, MD Primary Gastroenterologist:  Dr. Abbey Chatters  Pre-Procedure History & Physical: HPI:  Mary [name] is a 70 y.o. female is here for a colonoscopy to be performed history of large sessile serrated polyp. She underwent colonoscopy 03/2020 which revealed multiple polyps largest of which was a 30 mm sessile serrated polyp status post endoscopic mucosal resection.  She did have multiple other polyps removed which were smaller.  No high-grade dysplasia seen on pathology report.  Patient states she has done well since her procedure.  No rectal bleeding.  No abdominal pain  Past Medical History:  Diagnosis Date  . Anemia   . Arthritis   . Chronic kidney disease    idiopathic micro hematuria   . Complication of anesthesia    patient states does not take much medicine to put her to sleep   . Edema    ankles to knees   . GERD (gastroesophageal reflux disease)   . Headache(784.0)    hx of migraines   . Hyperlipidemia   . Mitral valve prolapse    no symptoms   . PONV (postoperative nausea and vomiting)   . Scalp alopecia   . Seizures ([place])    1 seizure 45 years ago; unknown etiology and no meds, no seizures since then.    Past Surgical History:  Procedure Laterality Date  . BIOPSY  03/24/2020   Procedure: BIOPSY;  Surgeon: Eloise Harman, DO;  Location: [facility];  Service: Endoscopy;;  gastric duodenum  . CERVICAL FUSION  2006   cervical 5-6   . COLONOSCOPY N/A 03/03/2017   Procedure: COLONOSCOPY;  Surgeon: Danie Binder, MD;  Location: [facility];  Service: Endoscopy;  Laterality: N/A;  1:15 pm  . COLONOSCOPY WITH PROPOFOL N/A 03/24/2020   Procedure: COLONOSCOPY WITH PROPOFOL;  Surgeon: Eloise Harman, DO;  [facility]: [facility];  Service: Endoscopy;  Laterality: N/A;  10:00am  . ESOPHAGOGASTRODUODENOSCOPY (EGD) WITH PROPOFOL N/A 03/24/2020   Procedure: ESOPHAGOGASTRODUODENOSCOPY (EGD) WITH PROPOFOL;  Surgeon: Eloise Harman,  DO;  [facility]: [facility];  Service: Endoscopy;  Laterality: N/A;  . HEMORRHOID SURGERY N/A 06/23/2017   Procedure: ANOSCOPY WITH REMOVAL OF SINGLE LESION;  Surgeon: [name], MD;  Location: [facility];  Service: General;  Laterality: N/A;  . MYOCARDIAL PERFUSION STUDY  07/02/2011   NO SCINTIGRAPHIC EVIDENCE OF INDUCIBLE MYOCARDIAL ISCHEMIA. POST-STRESS EF IS 87%.EXERCISE CAPACITY 9 METS. EKG SHOWS NSR AT 69. THERE IS 1MM HORIZONTAL ST DEPRESSION WITH EXERCISE IN II, III, AVF SEEN BEST AT EARLY RECOVERY. THIS IS PRESENT IN V3-V5 AS WELL AND RETURNS TO BASELINE AT THE END OF THE TEST. NORMAL STUDY.  Marland Kitchen POLYPECTOMY  03/24/2020   Procedure: POLYPECTOMY;  Surgeon: Eloise Harman, DO;  [facility]: [facility];  Service: Endoscopy;;  colon   . RENAL ARTERY DOPPLER  12/28/2003   CELIAC ARTERY: AT REST, 168.4 CM/S; INSPRIATION, 177.6 CM/S. ARCUATE LIGAMENT COMPRESSION SYNDROME. R & L KIDNEYS: RIGHT KIDNEY MEASURES SOME WHAT SMALLER THAN LEFT. KIDNEY ARE ESSENTIALLY SYMMETRICAL IN SHAPE WITH NO OBVIOUS ABNORMALITY VISUALIZED. R & L RENAL ARTERIES: DEMONSTRATE NORMAL SPECTRA. NO SUGGESTION OF DIAMETER REDUCTION, DISSECTION, FIBROMUSCULAR DYSPLASIA OR ANY OTHER VASCULAR ABNOR  . TOTAL KNEE ARTHROPLASTY Right 08/31/2013   Procedure: RIGHT TOTAL KNEE ARTHROPLASTY;  Surgeon: [name], MD;  Location: [facility];  Service: Orthopedics;  Laterality: Right;  . TOTAL KNEE ARTHROPLASTY Left 03/07/2015   Procedure: TOTAL KNEE ARTHROPLASTY;  Surgeon: [name], MD;  Location: [facility];  Service: Orthopedics;  Laterality: Left;  . TRANSTHORACIC ECHOCARDIOGRAM  [id number]   LV SYSTOLIC FUNCTION NORMAL, EF=>55%, INSIGNIFICANT PERICARDIAL EFFUSION, LEFT ATRIAL SIZE IS NORMAL, RV SYSTOLIC PRESSURE IS NORMAL. NO SIGN VALVULAR HEART DISEASE.  . TUBAL LIGATION  1986  . veins stripping      bilateral legs     Prior to Admission medications   Medication Sig Start Date End Date Taking? Authorizing Provider  acetaminophen  (TYLENOL) 500 MG tablet Take 500 mg by mouth every 8 (eight) hours as needed for headache. 07/21/18  Yes [provider]  bisacodyl (DULCOLAX) 5 MG EC tablet Take 5 mg by mouth at bedtime.   Yes [provider]  Calcium Citrate 250 MG TABS Take 500 mg by mouth 3 (three) times daily.    Yes [provider]  Coenzyme Q10 200 MG capsule Take 200 mg by mouth daily. ubiquinol   Yes [provider]  EMGALITY 120 MG/ML SOAJ Inject 120 mg into the skin every 30 (thirty) days.  09/09/19  Yes [provider]  ezetimibe (ZETIA) 10 MG tablet TAKE 1 TABLET BY MOUTH ONCE A DAY. Patient taking differently: Take 10 mg by mouth daily. 08/22/20  Yes Troy Sine, MD  Ferrous Sulfate (IRON) 325 (65 Fe) MG TABS Take 325 mg by mouth at bedtime.   Yes [provider]  fexofenadine (ALLEGRA) 180 MG tablet Take 180 mg by mouth daily.   Yes [provider]  Glucosamine-Chondroitin 750-600 MG TABS Take 1 tablet by mouth 2 (two) times daily.    Yes [provider]  ipratropium (ATROVENT) 0.06 % nasal spray Place 2 sprays into both nostrils daily.   Yes [provider]  Magnesium Gluconate 250 MG TABS Take 250 mg by mouth daily at 12 noon.    Yes [provider]  montelukast (SINGULAIR) 10 MG tablet Take 10 mg by mouth at bedtime.   Yes [provider]  Multiple Vitamin (MULTIVITAMIN WITH MINERALS) TABS tablet Take 1 tablet by mouth daily. Centrum Silver   Yes [provider]  Omega-3 Fatty Acids (FISH OIL) 1200 MG CAPS Take 1,200 mg by mouth in the morning and at bedtime.    Yes [provider]  omeprazole (PRILOSEC) 40 MG capsule Take 1 capsule (40 mg total) by mouth daily before breakfast. Take 30 minutes before breakfast 04/12/20 10/09/20 Yes Jasiya Markie K, DO  OVER THE COUNTER MEDICATION Take 4 capsules by mouth in the morning and at bedtime. kidney stuff Natural   Yes [provider]  Polyethyl  Glycol-Propyl Glycol (SYSTANE OP) Place 1 drop into both eyes 2 (two) times daily.   Yes [provider]  pyridOXINE (VITAMIN B-6) 100 MG tablet Take 100 mg by mouth daily.   Yes [provider]  rosuvastatin (CRESTOR) 40 MG tablet TAKE 1 TABLET BY MOUTH AT BEDTIME. Patient taking differently: Take 40 mg by mouth at bedtime. 06/19/20  Yes Troy Sine, MD  SUMAtriptan (IMITREX) 100 MG tablet Take 100 mg by mouth as needed for migraine. May repeat in 2 hours if headache persists or recurs.   Yes [provider]  SUMAtriptan Succinate Refill 4 MG/0.5ML SOCT Inject 4 mg into the skin as needed (for migraines).   Yes [provider]  Turmeric 500 MG CAPS Take 500 mg by mouth daily.   Yes [provider]  amoxicillin (AMOXIL) 500 MG capsule Take 2,000 mg by mouth See admin instructions. Twice a year  for Dentist 10/18/19   [provider]  candesartan (ATACAND) 8 MG tablet Take 1 tablet (8 mg total) by mouth daily. Patient not taking: No sig reported 08/25/20   Erma Heritage, PA-C    Allergies as of 08/29/2020 - Review Complete 08/21/2020  Allergen Reaction Noted  . Metoprolol Rash 02/28/2015  . Sporanox [itraconazole] Swelling and Other (See Comments) 05/20/2013    Family History  Problem Relation Age of Onset  . Dementia Mother   . Heart attack Father   . Heart disease Father   . Kidney failure Father   . Heart attack Brother        age 30  . Cancer - Other Brother        kidney  . Colon cancer Paternal Aunt     Social History   Socioeconomic History  . Marital status: Widowed    Spouse name: Not on file  . Number of children: Not on file  . Years of education: Not on file  . Highest education level: Not on file  Occupational History  . Not on file  Tobacco Use  . Smoking status: Former Smoker    Packs/day: 2.00    Years: 22.00    Pack years: 44.00    Types: Cigarettes    Quit date: 05/13/1988    Years since  quitting: 32.4  . Smokeless tobacco: Never Used  Vaping Use  . Vaping Use: Never used  Substance and Sexual Activity  . Alcohol use: Yes    Comment: wine/beer; couple times/month  . Drug use: No  . Sexual activity: Not Currently    Birth control/protection: Post-menopausal  Other Topics Concern  . Not on file  Social History Narrative  . Not on file   Social Determinants of Health   Financial Resource Strain: Not on file  Food Insecurity: Not on file  Transportation Needs: Not on file  Physical Activity: Not on file  Stress: Not on file  Social Connections: Not on file  Intimate Partner Violence: Not on file    Review of Systems: See HPI, otherwise negative ROS  Physical Exam: Vital signs in last 24 hours: Temp:  [98 F (36.7 C)] 98 F (36.7 C) (05/23 0802) Pulse Rate:  [64] 64 (05/23 0802) Resp:  [18] 18 (05/23 0802) BP: (131)/(70) 131/70 (05/23 0802) SpO2:  [97 %] 97 % (05/23 0802) Weight:  [62.1 kg] 62.1 kg (05/23 0802)   General:   Alert,  Well-developed, well-nourished, pleasant and cooperative in NAD Head:  Normocephalic and atraumatic. Eyes:  Sclera clear, no icterus.   Conjunctiva pink. Ears:  Normal auditory acuity. Nose:  No deformity, discharge,  or lesions. Mouth:  No deformity or lesions, dentition normal. Neck:  Supple; no masses or thyromegaly. Lungs:  Clear throughout to auscultation.   No wheezes, crackles, or rhonchi. No acute distress. Heart:  Regular rate and rhythm; no murmurs, clicks, rubs,  or gallops. Abdomen:  Soft, nontender and nondistended. No masses, hepatosplenomegaly or hernias noted. Normal bowel sounds, without guarding, and without rebound.   Msk:  Symmetrical without gross deformities. Normal posture. Extremities:  Without clubbing or edema. Neurologic:  Alert and  oriented x4;  grossly normal neurologically. Skin:  Intact without significant lesions or rashes. Cervical Nodes:  No significant cervical adenopathy. Psych:  Alert  and cooperative. Normal mood and affect.  Impression/Plan: [name] is here for a colonoscopy to be performed history of large sessile serrated polyp.   The risks of the procedure  including infection, bleed, or perforation as well as benefits, limitations, alternatives and imponderables have been reviewed with the patient. Questions have been answered. All parties agreeable.

## 2020-10-03 LAB — SURGICAL PATHOLOGY

## 2020-10-10 ENCOUNTER — Other Ambulatory Visit: Payer: Self-pay | Admitting: Internal Medicine

## 2020-10-10 ENCOUNTER — Encounter (HOSPITAL_COMMUNITY): Payer: Self-pay | Admitting: Internal Medicine

## 2020-10-19 DIAGNOSIS — X32XXXD Exposure to sunlight, subsequent encounter: Secondary | ICD-10-CM | POA: Diagnosis not present

## 2020-10-19 DIAGNOSIS — L570 Actinic keratosis: Secondary | ICD-10-CM | POA: Diagnosis not present

## 2020-10-19 DIAGNOSIS — D225 Melanocytic nevi of trunk: Secondary | ICD-10-CM | POA: Diagnosis not present

## 2020-10-19 DIAGNOSIS — Z1283 Encounter for screening for malignant neoplasm of skin: Secondary | ICD-10-CM | POA: Diagnosis not present

## 2020-10-19 DIAGNOSIS — L820 Inflamed seborrheic keratosis: Secondary | ICD-10-CM | POA: Diagnosis not present

## 2020-11-10 DIAGNOSIS — R311 Benign essential microscopic hematuria: Secondary | ICD-10-CM | POA: Diagnosis not present

## 2020-11-10 DIAGNOSIS — N3281 Overactive bladder: Secondary | ICD-10-CM | POA: Diagnosis not present

## 2020-11-10 DIAGNOSIS — T50905A Adverse effect of unspecified drugs, medicaments and biological substances, initial encounter: Secondary | ICD-10-CM | POA: Diagnosis not present

## 2020-11-10 DIAGNOSIS — Z0000 Encounter for general adult medical examination without abnormal findings: Secondary | ICD-10-CM | POA: Diagnosis not present

## 2020-11-10 DIAGNOSIS — E663 Overweight: Secondary | ICD-10-CM | POA: Diagnosis not present

## 2020-11-10 DIAGNOSIS — Z6825 Body mass index (BMI) 25.0-25.9, adult: Secondary | ICD-10-CM | POA: Diagnosis not present

## 2020-11-10 DIAGNOSIS — I10 Essential (primary) hypertension: Secondary | ICD-10-CM | POA: Diagnosis not present

## 2020-11-10 DIAGNOSIS — Z1331 Encounter for screening for depression: Secondary | ICD-10-CM | POA: Diagnosis not present

## 2020-11-16 DIAGNOSIS — E538 Deficiency of other specified B group vitamins: Secondary | ICD-10-CM | POA: Diagnosis not present

## 2020-11-16 DIAGNOSIS — Z0000 Encounter for general adult medical examination without abnormal findings: Secondary | ICD-10-CM | POA: Diagnosis not present

## 2020-11-16 DIAGNOSIS — E663 Overweight: Secondary | ICD-10-CM | POA: Diagnosis not present

## 2020-11-16 DIAGNOSIS — E559 Vitamin D deficiency, unspecified: Secondary | ICD-10-CM | POA: Diagnosis not present

## 2020-11-16 DIAGNOSIS — Z1389 Encounter for screening for other disorder: Secondary | ICD-10-CM | POA: Diagnosis not present

## 2020-11-16 DIAGNOSIS — Z6825 Body mass index (BMI) 25.0-25.9, adult: Secondary | ICD-10-CM | POA: Diagnosis not present

## 2020-11-20 DIAGNOSIS — Z6825 Body mass index (BMI) 25.0-25.9, adult: Secondary | ICD-10-CM | POA: Diagnosis not present

## 2020-11-20 DIAGNOSIS — Z124 Encounter for screening for malignant neoplasm of cervix: Secondary | ICD-10-CM | POA: Diagnosis not present

## 2021-02-17 ENCOUNTER — Other Ambulatory Visit: Payer: Self-pay | Admitting: Cardiovascular Disease

## 2021-03-05 ENCOUNTER — Ambulatory Visit: Payer: Medicare PPO | Admitting: Cardiovascular Disease

## 2021-03-05 ENCOUNTER — Encounter: Payer: Self-pay | Admitting: Cardiovascular Disease

## 2021-03-05 ENCOUNTER — Other Ambulatory Visit: Payer: Self-pay

## 2021-03-05 DIAGNOSIS — Z8249 Family history of ischemic heart disease and other diseases of the circulatory system: Secondary | ICD-10-CM | POA: Diagnosis not present

## 2021-03-05 DIAGNOSIS — K219 Gastro-esophageal reflux disease without esophagitis: Secondary | ICD-10-CM | POA: Diagnosis not present

## 2021-03-05 DIAGNOSIS — R0789 Other chest pain: Secondary | ICD-10-CM | POA: Diagnosis not present

## 2021-03-05 DIAGNOSIS — E7800 Pure hypercholesterolemia, unspecified: Secondary | ICD-10-CM | POA: Diagnosis not present

## 2021-03-05 DIAGNOSIS — I10 Essential (primary) hypertension: Secondary | ICD-10-CM

## 2021-03-05 DIAGNOSIS — Z8669 Personal history of other diseases of the nervous system and sense organs: Secondary | ICD-10-CM | POA: Diagnosis not present

## 2021-03-05 DIAGNOSIS — R079 Chest pain, unspecified: Secondary | ICD-10-CM

## 2021-03-05 DIAGNOSIS — R072 Precordial pain: Secondary | ICD-10-CM

## 2021-03-05 NOTE — Progress Notes (Signed)
Patient ID: Mary [name], female   DOB: Sep 02, 1950, 70 y.o.   MRN: [id number]     HPI: Mary [name] is a 70 y.o. female presents to the office for an 35 month cardiology evaluation.  Ms. Lafoy has a long-standing history of migraine headaches,  hypertension, hyperlipidemia, GERD, and has strong family history for both coronary as well as peripheral vascular disease. She also has PVD with lower extremity venous abnormalities. Remotely, she had been on pravastatin for hyperlipidemia but this was switched to Crestor do to inadequate response to pravastatin.   When I saw her over one year ago, she was stable on amlodipine 10 mg, benazepril 10 mg for hypetension as well as her migraine headaches and Crestor 40 mg for hyperlipidemia.   She had developed a rash and stopped taking Toprol-XL 12.5 mg.  She denied any palpitations.  Following its discontinuance or awareness of blood pressure elevation.  Over the past year, she has been bothered by chronic bronchitis and allergic cough.  She also underwent left knee replacement in October 2016 after previously undergoing right knee replacement in April 2015.  She has seen Dr. Gerarda Fraction in July and apparently complete set of blood work was done and she was told that these were within normal limits.  She denies any chest pain.  She is unaware of recurrent palpitations.  She denies presyncope or syncope.  She completed 3 courses of antibiotics.  She will be having a follow-up ENT evaluation later this week.  A chest x-ray on 02/12/2016 did not reveal any active cardiopulmonary disease.   She had developed a dry hacking cough and also was having swelling wall on amlodipine/benazepril combination 10/10.  Her neurologist who takes care of her migraines ultimately switched her to candesartan and she is now on 32 mg daily.  She believes her migraine frequency has increased with the change of medication.  Previously when she was on Lotrel.  She would experience 4  migraines per year and since February 2018 had experienced 15, although less intense than previously.  When I saw her, I added back amlodipine at 2.5 mg.  This has resulted in significant reduction in her migraines  She had undergone blood work by Dr. Gerarda Fraction in 2018.  Her cholesterol was 174 with an LDL of 92 and triglycerides of 67.  When I saw her in October 2018, I added Zetia 10 mg to her rosuvastatin 40 mg in attempt to reach an LDL less than 70.    Subsequent blood work has shown benefit and when I last saw her in February 2019  recent LDL 1 week previously was 67.  HbA1c will A1c was minimally increased at 5.8.  These were normal.  She underwent outpatient surgery last week for resection of a small anal wort  She tolerated this well.    I saw her in January 2020.  Prior to that evaluation she had been doing  fairly well but follow-up some episodes of fatigability and chest heaviness in October which led to an evaluation by [name], PAC.  An echo Doppler study on February 17, 2018 showed an EF of 60 to 65%.  She had normal wall motion.  Nuclear perfusion study showed a small defect was felt most likely due to breast attenuation rather than ischemia.  He was low risk study.  Nuclear stress ejection fraction was 67%.  Subsequently, she has kept a log of episodes of chest heaviness.  These typically are nonexertional.  She also has  been monitoring her blood pressure which consistently has been stable.  She brought this log with her to the office  which I reviewed.    She was evaluated by Coletta Memos, NP in August 2020.  Her blood pressure was 138/72 but further improved at home.  She was having ankle swelling and amlodipine was reduced to 2.5 mg.  She continued to be on candesartan 32 mg.  She continues to be on Zetia, rosuvastatin 40 mg, coenzyme every 10 and omega fatty acids.  I last saw her on Oct 05, 2019.  Since her last evaluation, at time she had noted some chest wall, musculoskeletal rib  discomfort to her chest.  She is able to walk at least 8000 steps daily and denies any exertional symptomatology.  Her cousin had recently died with abnormality to her pleura.  She she has a strong family history for CAD and PAD.  She denies palpitations, presyncope or syncope.  I suspect that her chest pain was musculoskeletal in etiology.  With her strong family history and recent cousin who died I recommended she undergo coronary CTA for assessment of coronary obstructive disease and hopefully allay some of her concerns for significant CAD.  She underwent coronary CTA on November 09, 2019 which demonstrated a calcium score of 0.  There was no evidence for coronary plaque.  Since I saw her, she was evaluated by Coletta Memos in July 2021 and more recently by Bernerd Pho, PA-C in March 2022.  Presently, she feels well and denies any recurrent  chest wall discomfort.  She exercises regularly and does water aerobics on Monday Wednesday and Friday and walks the [place] 1.6 miles 6 days/week.  She has issues with migraines and has been on Emgality injection once a month and is followed by Dr. Drenda Freeze her neurologist.   Past Medical History:  Diagnosis Date   Anemia    Arthritis    Chronic kidney disease    idiopathic micro hematuria    Complication of anesthesia    patient states does not take much medicine to put her to sleep    Edema    ankles to knees    GERD (gastroesophageal reflux disease)    Headache(784.0)    hx of migraines    Hyperlipidemia    Mitral valve prolapse    no symptoms    PONV (postoperative nausea and vomiting)    Scalp alopecia    Seizures ([place])    1 seizure 45 years ago; unknown etiology and no meds, no seizures since then.    Past Surgical History:  Procedure Laterality Date   BIOPSY  03/24/2020   Procedure: BIOPSY;  Surgeon: Eloise Harman, DO;  [facility];  Service: Endoscopy;;  gastric duodenum   CERVICAL FUSION  2006   cervical  5-6    COLONOSCOPY N/A 03/03/2017   Procedure: COLONOSCOPY;  Surgeon: Danie Binder, MD;  [facility]: [facility];  Service: Endoscopy;  Laterality: N/A;  1:15 pm   COLONOSCOPY WITH PROPOFOL N/A 03/24/2020   Procedure: COLONOSCOPY WITH PROPOFOL;  Surgeon: Eloise Harman, DO;  [facility];  Service: Endoscopy;  Laterality: N/A;  10:00am   COLONOSCOPY WITH PROPOFOL N/A 10/02/2020   Procedure: COLONOSCOPY WITH PROPOFOL;  Surgeon: Eloise Harman, DO;  [facility];  Service: Endoscopy;  Laterality: N/A;  ASA II / 9:00   ESOPHAGOGASTRODUODENOSCOPY (EGD) WITH PROPOFOL N/A 03/24/2020   Procedure: ESOPHAGOGASTRODUODENOSCOPY (EGD) WITH PROPOFOL;  Surgeon:  Eloise Harman, DO;  [facility];  Service: Endoscopy;  Laterality: N/A;   HEMORRHOID SURGERY N/A 06/23/2017   Procedure: ANOSCOPY WITH REMOVAL OF SINGLE LESION;  Surgeon: [name], MD;  Location: [facility];  Service: General;  Laterality: N/A;   MYOCARDIAL PERFUSION STUDY  07/02/2011   NO SCINTIGRAPHIC EVIDENCE OF INDUCIBLE MYOCARDIAL ISCHEMIA. POST-STRESS EF IS 87%.EXERCISE CAPACITY 9 METS. EKG SHOWS NSR AT 69. THERE IS 1MM HORIZONTAL ST DEPRESSION WITH EXERCISE IN II, III, AVF SEEN BEST AT EARLY RECOVERY. THIS IS PRESENT IN V3-V5 AS WELL AND RETURNS TO BASELINE AT THE END OF THE TEST. NORMAL STUDY.   POLYPECTOMY  03/24/2020   Procedure: POLYPECTOMY;  Surgeon: Eloise Harman, DO;  [facility];  Service: Endoscopy;;  colon    POLYPECTOMY  10/02/2020   Procedure: POLYPECTOMY;  Surgeon: Eloise Harman, DO;  [facility];  Service: Endoscopy;;   RENAL ARTERY DOPPLER  12/28/2003   CELIAC ARTERY: AT REST, 168.4 CM/S; INSPRIATION, 177.6 CM/S. ARCUATE LIGAMENT COMPRESSION SYNDROME. R & L KIDNEYS: RIGHT KIDNEY MEASURES SOME WHAT SMALLER THAN LEFT. KIDNEY ARE ESSENTIALLY SYMMETRICAL IN SHAPE WITH NO OBVIOUS ABNORMALITY VISUALIZED. R & L RENAL ARTERIES: DEMONSTRATE NORMAL SPECTRA. NO  SUGGESTION OF DIAMETER REDUCTION, DISSECTION, FIBROMUSCULAR DYSPLASIA OR ANY OTHER VASCULAR ABNOR   TOTAL KNEE ARTHROPLASTY Right 08/31/2013   Procedure: RIGHT TOTAL KNEE ARTHROPLASTY;  Surgeon: [name], MD;  Location: [facility];  Service: Orthopedics;  Laterality: Right;   TOTAL KNEE ARTHROPLASTY Left 03/07/2015   Procedure: TOTAL KNEE ARTHROPLASTY;  Surgeon: [name], MD;  Location: [facility];  Service: Orthopedics;  Laterality: Left;   TRANSTHORACIC ECHOCARDIOGRAM  1/74/9449   LV SYSTOLIC FUNCTION NORMAL, EF=>55%, INSIGNIFICANT PERICARDIAL EFFUSION, LEFT ATRIAL SIZE IS NORMAL, RV SYSTOLIC PRESSURE IS NORMAL. NO SIGN VALVULAR HEART DISEASE.   TUBAL LIGATION  1986   veins stripping      bilateral legs     Allergies  Allergen Reactions   Nsaids Other (See Comments)    elevated-creatinine levels   Metoprolol Rash   Sporanox [Itraconazole] Swelling and Other (See Comments)    Bilateral knee and ankle swelling and pain     Current Outpatient Medications  Medication Sig Dispense Refill   acetaminophen (TYLENOL) 500 MG tablet Take 500 mg by mouth every 8 (eight) hours as needed for headache.     bisacodyl (DULCOLAX) 5 MG EC tablet Take 5 mg by mouth at bedtime.     Calcium Citrate 250 MG TABS Take 500 mg by mouth 3 (three) times daily.      Coenzyme Q10 200 MG capsule Take 200 mg by mouth daily. ubiquinol     EMGALITY 120 MG/ML SOAJ Inject 120 mg into the skin every 30 (thirty) days.      ezetimibe (ZETIA) 10 MG tablet TAKE 1 TABLET BY MOUTH ONCE A DAY. 90 tablet 1   Ferrous Sulfate (IRON) 325 (65 Fe) MG TABS Take 325 mg by mouth at bedtime.     fexofenadine (ALLEGRA) 180 MG tablet Take 180 mg by mouth daily.     Glucosamine-Chondroitin 750-600 MG TABS Take 1 tablet by mouth 2 (two) times daily.      ipratropium (ATROVENT) 0.06 % nasal spray Place 2 sprays into both nostrils daily.     Magnesium Gluconate 250 MG TABS Take 250 mg by mouth daily at 12 noon.      Multiple Vitamin  (MULTIVITAMIN WITH MINERALS) TABS tablet Take 1 tablet by mouth daily.  Centrum Silver     Omega-3 Fatty Acids (FISH OIL) 1200 MG CAPS Take 1,200 mg by mouth in the morning and at bedtime.      omeprazole (PRILOSEC) 40 MG capsule TAKE (1) CAPSULE BY MOUTH EACH MORNING. 30 capsule 11   OVER THE COUNTER MEDICATION Take 4 capsules by mouth in the morning and at bedtime. kidney stuff Natural     oxybutynin (DITROPAN) 5 MG tablet Take 5 mg by mouth 2 (two) times daily.     Polyethyl Glycol-Propyl Glycol (SYSTANE OP) Place 1 drop into both eyes 2 (two) times daily.     pyridOXINE (VITAMIN B-6) 100 MG tablet Take 100 mg by mouth daily.     rosuvastatin (CRESTOR) 40 MG tablet TAKE 1 TABLET BY MOUTH AT BEDTIME. (Patient taking differently: Take 40 mg by mouth at bedtime.) 90 tablet 2   SUMAtriptan (IMITREX) 100 MG tablet Take 100 mg by mouth as needed for migraine. May repeat in 2 hours if headache persists or recurs.     SUMAtriptan Succinate Refill 4 MG/0.5ML SOCT Inject 4 mg into the skin as needed (for migraines).     Turmeric 500 MG CAPS Take 500 mg by mouth daily.     amoxicillin (AMOXIL) 500 MG capsule Take 2,000 mg by mouth See admin instructions. Twice a year  for Dentist (Patient not taking: Reported on 03/05/2021)     montelukast (SINGULAIR) 10 MG tablet Take 10 mg by mouth at bedtime. (Patient not taking: Reported on 03/05/2021)     No current facility-administered medications for this visit.    Social History   Socioeconomic History   Marital status: Widowed    Spouse name: Not on file   Number of children: Not on file   Years of education: Not on file   Highest education level: Not on file  Occupational History   Not on file  Tobacco Use   Smoking status: Former    Packs/day: 2.00    Years: 22.00    Pack years: 44.00    Types: Cigarettes    Quit date: 05/13/1988    Years since quitting: 32.8   Smokeless tobacco: Never  Vaping Use   Vaping Use: Never used  Substance and Sexual  Activity   Alcohol use: Yes    Comment: wine/beer; couple times/month   Drug use: No   Sexual activity: Not Currently    Birth control/protection: Post-menopausal  Other Topics Concern   Not on file  Social History Narrative   Not on file   Social Determinants of Health   Financial Resource Strain: Not on file  Food Insecurity: Not on file  Transportation Needs: Not on file  Physical Activity: Not on file  Stress: Not on file  Social Connections: Not on file  Intimate Partner Violence: Not on file   Social history is notable that she is married and has remained active. She does water aerobics and also rides a bicycle. She does not smoke, but there is a remote history of tobacco use having quit approximately 27 years ago. She has 2 children. She is the sister-in-law of Dr. Adella Hare and is married to Dr. Daylene Posey wife's brother.  She is retired Chief Technology Officer and retired at age 25.  Family History  Problem Relation Age of Onset   Dementia Mother    Heart attack Father    Heart disease Father    Kidney failure Father    Heart attack Brother  age 65   Cancer - Other Brother        kidney   Colon cancer Paternal Aunt    ROS General: Negative; No fevers, chills, or night sweats;  HEENT: Negative; No changes in vision or hearing, sinus congestion, difficulty swallowing Pulmonary: Negative; No cough, wheezing, shortness of breath, hemoptysis Cardiovascular: See history of present illness GI: Negative; No nausea, vomiting, diarrhea, or abdominal pain GU: Colonoscopy October 2018, status post outpatient surgery for an small anal wart Musculoskeletal: Negative; no myalgias, joint pain, or weakness Hematologic/Oncology: Negative; no easy bruising, bleeding Endocrine: Negative; no heat/cold intolerance; no diabetes Neuro: Positive for history of migraines, currently treated with Emgality with good benefit but she had experienced 3 mild episodes over the past  month Skin: Negative; No rashes or skin lesions Psychiatric: Negative; No behavioral problems, depression Sleep: Negative; No snoring, daytime sleepiness, hypersomnolence, bruxism, restless legs, hypnogognic hallucinations, no cataplexy Other comprehensive 14 point system review is negative.   PE BP 124/78 (BP Location: Left Arm, Patient Position: Sitting, Cuff Size: Normal)   Pulse 61   Ht 5' 4.25" (1.632 m)   Wt 154 lb 12.8 oz (70.2 kg)   SpO2 97%   BMI 26.36 kg/m    Repeat blood pressure by me was 130/76  Wt Readings from Last 3 Encounters:  03/05/21 154 lb 12.8 oz (70.2 kg)  10/02/20 137 lb (62.1 kg)  08/21/20 147 lb 12.8 oz (67 kg)   General: Alert, oriented, no distress.  Skin: normal turgor, no rashes, warm and dry HEENT: Normocephalic, atraumatic. Pupils equal round and reactive to light; sclera anicteric; extraocular muscles intact;  Nose without nasal septal hypertrophy Mouth/Parynx benign; Mallinpatti scale 2 Neck: No JVD, no carotid bruits; normal carotid upstroke Lungs: clear to ausculatation and percussion; no wheezing or rales Chest wall: without tenderness to palpitation Heart: PMI not displaced, RRR, s1 s2 normal, 1/6 systolic murmur, no diastolic murmur, no rubs, gallops, thrills, or heaves Abdomen: soft, nontender; no hepatosplenomehaly, BS+; abdominal aorta nontender and not dilated by palpation. Back: no CVA tenderness Pulses 2+ Musculoskeletal: full range of motion, normal strength, no joint deformities Extremities: no clubbing cyanosis or edema, Homan's sign negative  Neurologic: grossly nonfocal; Cranial nerves grossly wnl Psychologic: Normal mood and affect   March 05, 2021 ECG (independently read by me):  NSR at 61, no ectopy,   May 2021 ECG (independently read by me): Normal sinus rhythm with mild sinus arrhythmia at 67 bpm.  Normal intervals.  No ectopy no ST segment changes  January 2020 ECG (independently read by me): Sinus rhythm with PACs  with ventricular rate of 65 bpm.  No ectopy.  February 2019 ECG (independently read by me): Normal sinus rhythm with PAC.  PR interval 172 ms.  Nonspecific ST changes.  QTc interval 379 ms.  October 2018 ECG (independently read by me): Normal sinus rhythm at 69 bpm.  Nonspecific ST changes.  PR interval 156 more seconds, QTc interval 390 ms.  October 2017 ECG (independently read by me): Normal sinus rhythm at 64 bpm.  July 2016 ECG (independently read by me):  Normal sinus rhythm at 72 bpm. Borderline left atrial enlargement.  January 2016 ECG (independently read by me): Normal sinus rhythm at 69 bpm.  Normal intervals.  No significant ST segment changes.  January 2015 ECG (independently read by me): Normal sinus rhythm with nonspecific ST-T changes. Heart rate 75 beats per minute.  LABS:  I personally reviewed the repeat laboratory done on 01/28/2017 by Dr.  Fusco.  Lipid studies from February 04, 2018 reveal a total cholesterol 132, HDL 55, LDL 60, triglycerides 87.     BMP Latest Ref Rng & Units 11/04/2019 07/01/2017 06/19/2017  Glucose 65 - 99 mg/dL 100(H) 93 123(H)  BUN 8 - 27 mg/dL 22 22 27(H)  Creatinine 0.57 - 1.00 mg/dL 1.21(H) 1.13(H) 1.33(H)  BUN/Creat Ratio 12 - '28 18 19 ' -  Sodium 134 - 144 mmol/L 141 141 135  Potassium 3.5 - 5.2 mmol/L 4.5 4.8 4.7  Chloride 96 - 106 mmol/L 107(H) 107(H) 102  CO2 20 - 29 mmol/L '22 20 23  ' Calcium 8.7 - 10.3 mg/dL 9.3 9.7 9.4   Hepatic Function Latest Ref Rng & Units 07/01/2017 12/29/2014 05/10/2014  Total Protein 6.0 - 8.5 g/dL 6.9 6.8 6.9  Albumin 3.6 - 4.8 g/dL 4.3 3.9 4.1  AST 0 - 40 IU/L '23 19 19  ' ALT 0 - 32 IU/L '16 17 17  ' Alk Phosphatase 39 - 117 IU/L 63 73 69  Total Bilirubin 0.0 - 1.2 mg/dL 0.4 0.4 0.4   CBC Latest Ref Rng & Units 01/21/2020 06/19/2017 03/03/2017  WBC 3.8 - 10.8 Thousand/uL 5.5 5.6 4.5  Hemoglobin 11.7 - 15.5 g/dL 13.4 11.8(L) 10.9(L)  Hematocrit 35.0 - 45.0 % 40.7 37.0 33.4(L)  Platelets 140 - 400 Thousand/uL  204 234 221   Lab Results  Component Value Date   MCV 91.9 01/21/2020   MCV 91.6 06/19/2017   MCV 91.5 03/03/2017   Lab Results  Component Value Date   TSH 2.014 12/29/2014   Lipid Panel     Component Value Date/Time   CHOL 136 07/01/2017 0932   TRIG 49 07/01/2017 0932   HDL 59 07/01/2017 0932   CHOLHDL 2.3 07/01/2017 0932   CHOLHDL 2.3 12/29/2014 0852   VLDL 11 12/29/2014 0852   LDLCALC 67 07/01/2017 0932     RADIOLOGY: No results found.  IMPRESSION:  1. Essential hypertension   2. Chest pain, musculoskeletal   3. Pure hypercholesterolemia   4. Family history of heart disease   5. Hx of migraine headaches   6. Gastroesophageal reflux disease without esophagitis     ASSESSMENT AND PLAN: Ms. Wheller is 70 year-old female who has a long-standing history of hypertension as well as hyperlipidemia.  She had developed a nonproductive cough on Lotrel, which led to discontinuance of ACE inhibition.  More recently she had been on a regimen consisting of low-dose amlodipine as well as candesartan.  She was on rosuvastatin 40 mg and Zetia for hyperlipidemia.  Presently, her blood pressure is stable and she is no longer on candesartan.  She no longer experiences any of her prior chest pain which most likely was of musculoskeletal etiology.  Her coronary CTA showed a calcium score of 0 without coronary plaque.  She now is exercising regularly walking the [place] and [address] 1.6 mile 6 days/week and does water aerobics on Monday Wednesday and Friday.  She has had issues with migraine headaches which have significantly improved since initiating Emgality injections monthly.  She continues to be on Zetia 10 mg and rosuvastatin 40 mg for hyperlipidemia.  LDL cholesterol is been stable and under 70.  Has a history of GERD and is on omeprazole.  She will continue her current medical regimen.  Long as she is stable I will see her in 1 year for reevaluation or sooner as needed.   Troy Sine, MD, [facility]  03/07/2021 5:45 PM

## 2021-03-05 NOTE — Patient Instructions (Signed)
Medication Instructions:  Your Physician recommend you continue on your current medication as directed.    *If you need a refill on your cardiac medications before your next appointment, please call your pharmacy*   Follow-Up: At [facility], you and your health needs are our priority.  As part of our continuing mission to provide you with exceptional heart care, we have created designated Provider Care Teams.  These Care Teams include your primary Cardiologist (physician) and Advanced Practice Providers (APPs -  Physician Assistants and Nurse Practitioners) who all work together to provide you with the care you need, when you need it.  We recommend signing up for the patient portal called "MyChart".  Sign up information is provided on this After Visit Summary.  MyChart is used to connect with patients for Virtual Visits (Telemedicine).  Patients are able to view lab/test results, encounter notes, upcoming appointments, etc.  Non-urgent messages can be sent to your provider as well.   To learn more about what you can do with MyChart, go to [email or web address].    Your next appointment:   12 month(s)  The format for your next appointment:   In Person  Provider:   Shelva Majestic, MD

## 2021-03-07 ENCOUNTER — Encounter: Payer: Self-pay | Admitting: Cardiovascular Disease

## 2021-03-26 ENCOUNTER — Other Ambulatory Visit: Payer: Self-pay | Admitting: Cardiovascular Disease

## 2021-05-03 ENCOUNTER — Other Ambulatory Visit (HOSPITAL_COMMUNITY): Payer: Self-pay | Admitting: Obstetrics and Gynecology

## 2021-05-03 DIAGNOSIS — Z1231 Encounter for screening mammogram for malignant neoplasm of breast: Secondary | ICD-10-CM

## 2021-05-18 ENCOUNTER — Ambulatory Visit (HOSPITAL_COMMUNITY)
Admission: RE | Admit: 2021-05-18 | Discharge: 2021-05-18 | Disposition: A | Discharge status: Home / Self Care | Payer: Medicare PPO | Source: Ambulatory Visit | Attending: Obstetrics and Gynecology | Admitting: Obstetrics and Gynecology

## 2021-05-18 ENCOUNTER — Other Ambulatory Visit: Payer: Self-pay

## 2021-05-18 DIAGNOSIS — Z1231 Encounter for screening mammogram for malignant neoplasm of breast: Secondary | ICD-10-CM | POA: Insufficient documentation

## 2021-08-22 ENCOUNTER — Other Ambulatory Visit: Payer: Self-pay | Admitting: Cardiovascular Disease

## 2021-09-03 DIAGNOSIS — H25813 Combined forms of age-related cataract, bilateral: Secondary | ICD-10-CM | POA: Diagnosis not present

## 2021-10-15 IMAGING — MG DIGITAL SCREENING BILAT W/ TOMO W/ CAD
6 of 10 series · 6 of 30 positions shown · non-contrast
Comparison: Previous exam(s).

CLINICAL DATA: Screening.

EXAM:
DIGITAL SCREENING BILATERAL MAMMOGRAM WITH TOMO AND CAD

[R CC synth-2D]
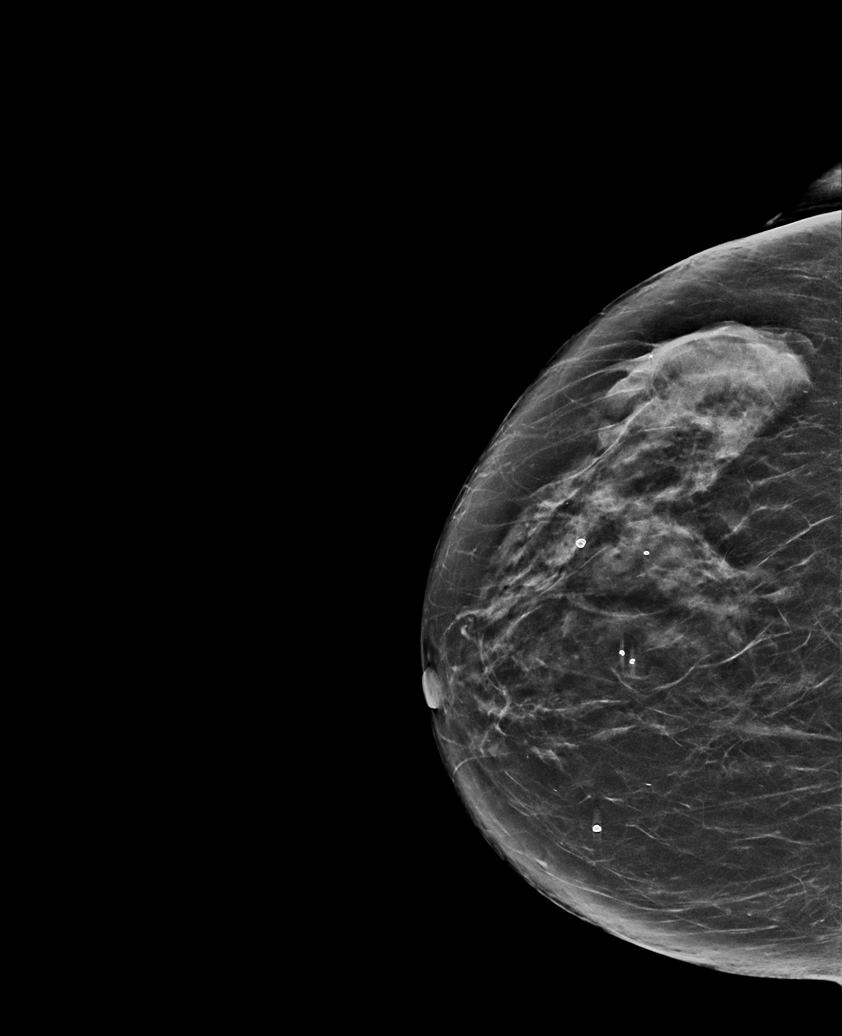

[L CC synth-2D]
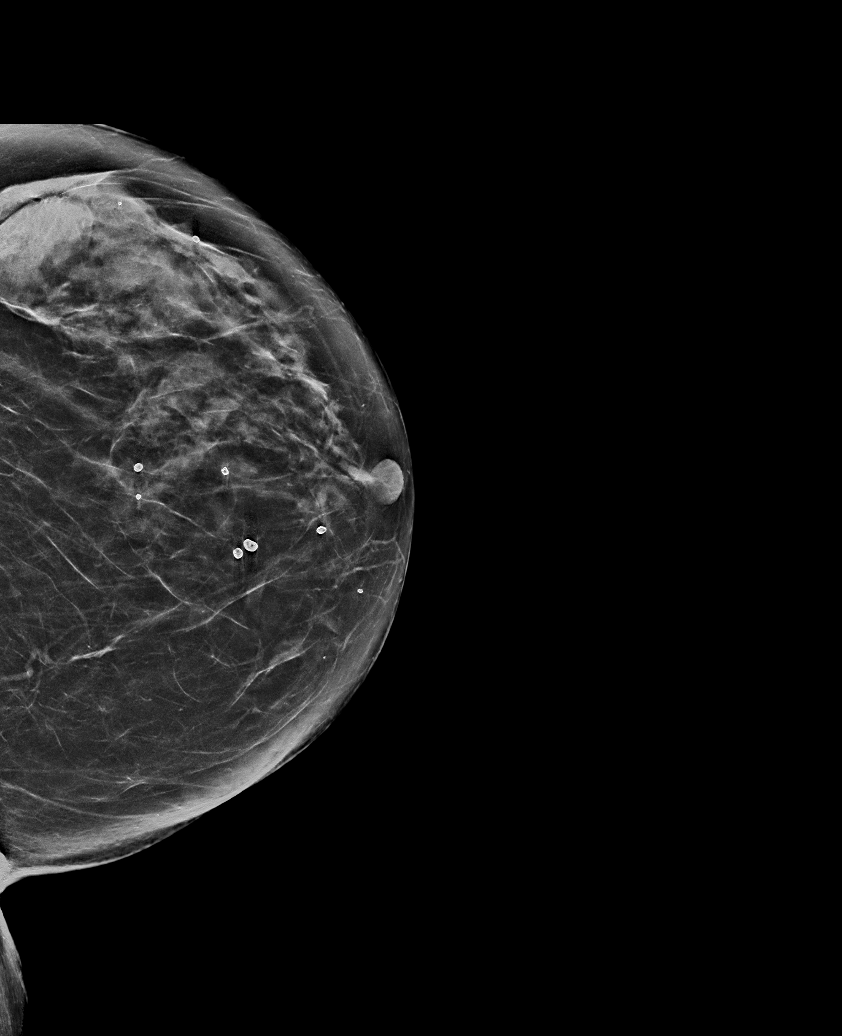

[L MLO synth-2D]
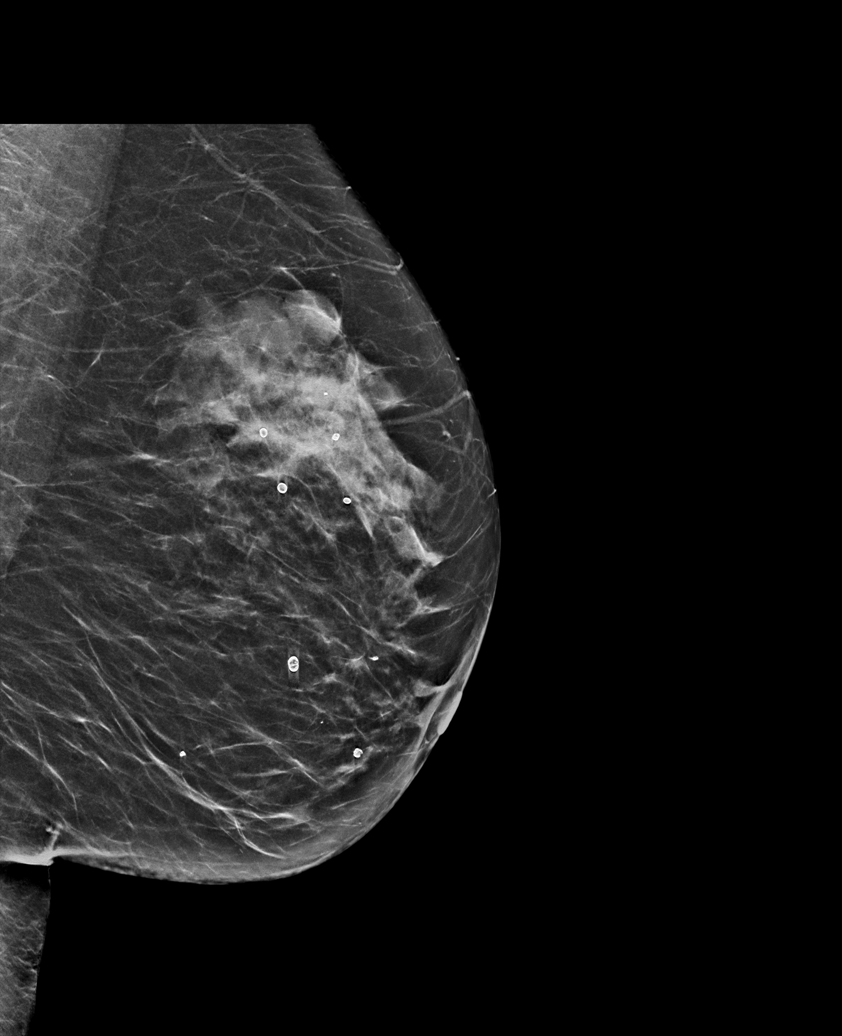

[R MLO synth-2D (1 of 2)]
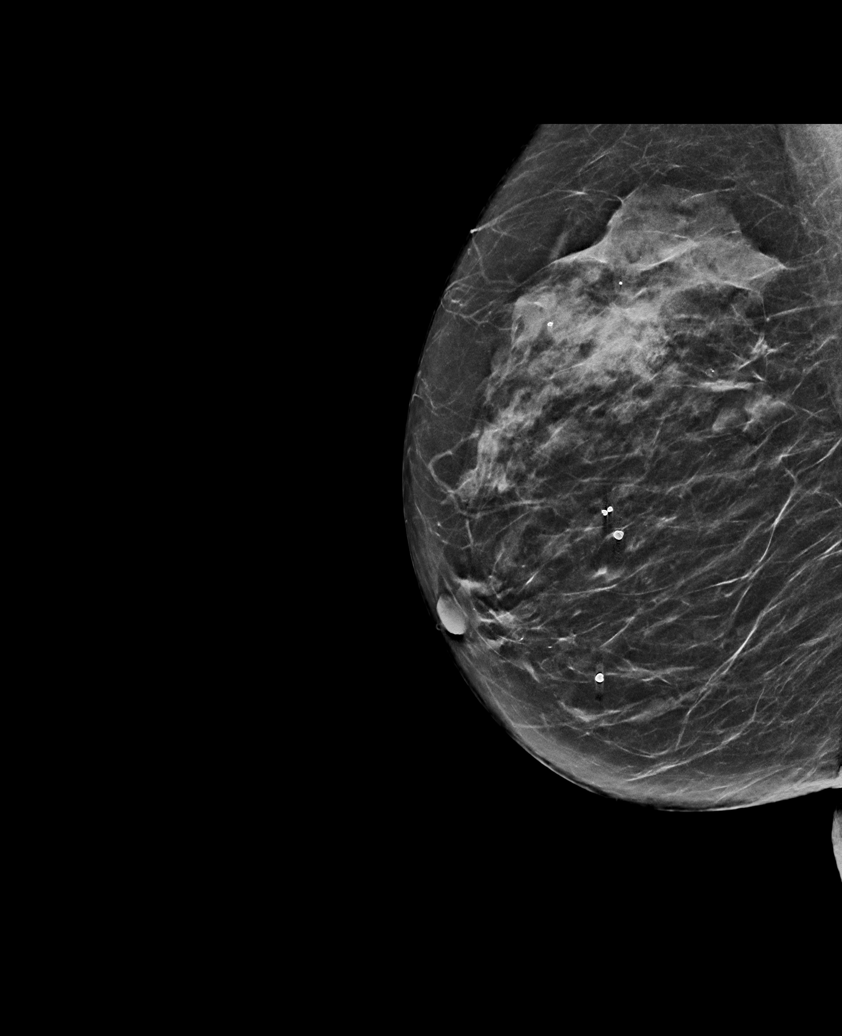

[R MLO synth-2D (2 of 2)]
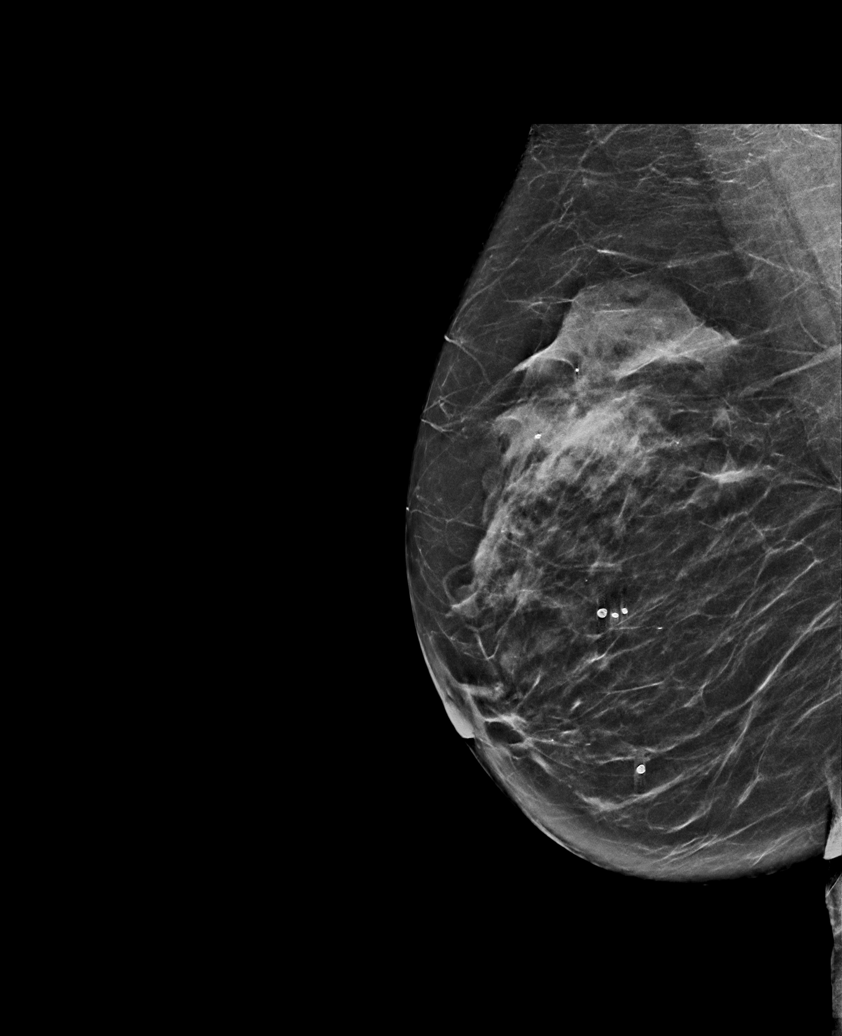

[R MLO tomo · tomo slice 31/60.0]
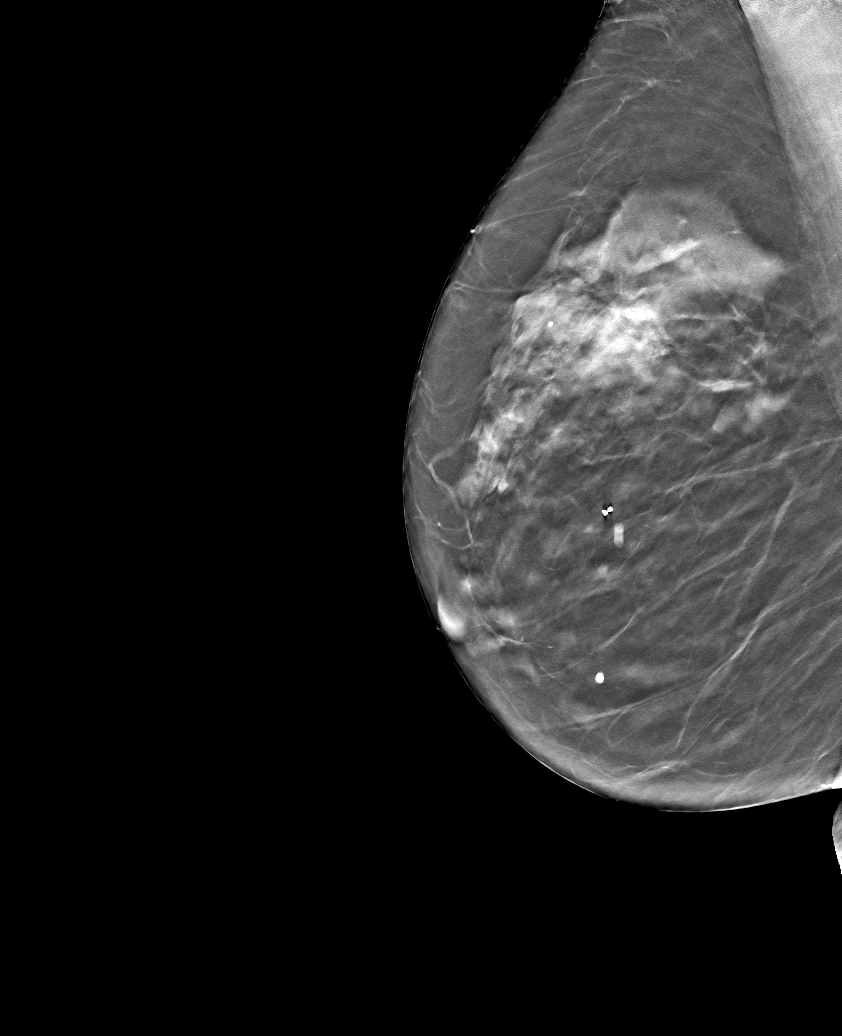

[6 of 30 positions shown; findings below may reference images not displayed]

ACR Breast Density Category c: The breast tissue is heterogeneously
dense, which may obscure small masses.
FINDINGS: There are no findings suspicious for malignancy. Images were
processed with CAD.
IMPRESSION: No mammographic evidence of malignancy. A result letter of this
screening mammogram will be mailed directly to the patient.

RECOMMENDATION:
Screening mammogram in one year. (Code:[id number])

BI-RADS CATEGORY  1: Negative.

## 2021-10-17 ENCOUNTER — Other Ambulatory Visit: Payer: Self-pay | Admitting: Gastroenterology

## 2021-10-19 ENCOUNTER — Telehealth: Payer: Self-pay

## 2021-10-19 ENCOUNTER — Telehealth: Payer: Self-pay | Admitting: Internal Medicine

## 2021-10-19 NOTE — Telephone Encounter (Signed)
Pt came to front window saying she needed a refill on her omeprazole and she was out. She said [facility] had faxed [place] refill request. She is aware of her VIRTUAL appointment on 10/30/2021 since we haven't seen her since 2021.

## 2021-10-19 NOTE — Telephone Encounter (Signed)
Patient came in to the office stating that she is completely out of her omeprazole and is needing a refill to be sent in to the pharmacy. Pt was instructed to schedule an appt due to not being seen since 04/2020.

## 2021-10-19 NOTE — Telephone Encounter (Signed)
Message has been sent to Dr. Abbey Chatters regarding refill.

## 2021-10-19 NOTE — Telephone Encounter (Signed)
Pt came in to the office today stating that she is completely out of her omeprazole and was requesting that a refill be sent in to her pharmacy. A refill request was sent previously regarding this. Pt was instructed to schedule an ov due to not being seen since 04/2020. Can a 30 day supply be sent in for this patient in the meantime.

## 2021-10-24 ENCOUNTER — Other Ambulatory Visit: Payer: Self-pay | Admitting: Internal Medicine

## 2021-10-24 MED ORDER — OMEPRAZOLE 40 MG PO CPDR: 40.0000 mg | DELAYED_RELEASE_CAPSULE | Freq: Every day | ORAL | 5 refills | Status: DC

## 2021-10-24 NOTE — Progress Notes (Signed)
Omeprazole sent to pharmacy.

## 2021-10-24 NOTE — Telephone Encounter (Signed)
I sent refills of her omeprazole to the pharmacy.  Thank you

## 2021-10-25 NOTE — Telephone Encounter (Signed)
Pt was made aware and verbalized understanding.  

## 2021-10-30 ENCOUNTER — Telehealth (INDEPENDENT_AMBULATORY_CARE_PROVIDER_SITE_OTHER): Payer: Medicare PPO | Admitting: Gastroenterology

## 2021-10-30 ENCOUNTER — Telehealth: Payer: Self-pay

## 2021-10-30 ENCOUNTER — Encounter: Payer: Self-pay | Admitting: Gastroenterology

## 2021-10-30 VITALS — Ht 64.0 in | Wt 149.0 lb

## 2021-10-30 DIAGNOSIS — K219 Gastro-esophageal reflux disease without esophagitis: Secondary | ICD-10-CM | POA: Diagnosis not present

## 2021-10-30 DIAGNOSIS — K5900 Constipation, unspecified: Secondary | ICD-10-CM

## 2021-10-30 MED ORDER — POLYETHYLENE GLYCOL 3350 17 GM/SCOOP PO POWD: ORAL | 11 refills | Status: DC

## 2021-10-30 NOTE — Telephone Encounter (Signed)
Mary [name], you are scheduled for a virtual visit with your provider today.  Just as we do with appointments in the office, we must obtain your consent to participate.  Your consent will be active for this visit and any virtual visit you may have with one of our providers in the next 365 days.  If you have a MyChart account, I can also send a copy of this consent to you electronically.  All virtual visits are billed to your insurance company just like a traditional visit in the office.  As this is a virtual visit, video technology does not allow for your provider to perform a traditional examination.  This may limit your provider's ability to fully assess your condition.  If your provider identifies any concerns that need to be evaluated in person or the need to arrange testing such as labs, EKG, etc, we will make arrangements to do so.  Although advances in technology are sophisticated, we cannot ensure that it will always work on either your end or our end.  If the connection with a video visit is poor, we may have to switch to a telephone visit.  With either a video or telephone visit, we are not always able to ensure that we have a secure connection.   I need to obtain your verbal consent now.   Are you willing to proceed with your visit today?

## 2021-10-30 NOTE — Patient Instructions (Addendum)
Continue omeprazole 40 mg daily for now. Add MiraLAX 1 capful (17 g) twice daily until soft stool.  Then continue 17 g daily as needed to maintain regular bowel movements.  You can continue once daily or you can assess daily whether you need a dose.  Would recommend taking a dose if you go more than 24 hours without an adequate bowel movement. Send a progress report via MyChart in 3 to 4 weeks.  If bowel function has improved, at that time would recommend trying omeprazole 20 mg daily to see if this is adequate for management of your reflux/gastritis.   We will see you back in May 2025, at that time you will be due for your next colonoscopy.

## 2021-10-30 NOTE — Progress Notes (Signed)
Primary Care Physician:  Redmond School, MD Primary GI:  Elon Alas. Abbey Chatters, DO   Patient Location: Home  Provider Location: [place] office  Reason for Visit:  Chief Complaint  Patient presents with   Gastroesophageal Reflux   Constipation   Bloated    Persons present on the virtual encounter, with roles: Patient, myself (provider),Tammy Clifton, CMA (updated meds and allergies)  Total time (minutes) spent on medical discussion: 15 minutes  Due to COVID-19, visit was conducted using Mychart video method.  Visit was requested by patient.  Virtual Visit via Mychart video  I connected with Mary [name] on 10/30/21 at  3:30 PM EDT by Mychart video and verified that I am speaking with the correct person using two identifiers.   I discussed the limitations, risks, security and privacy concerns of performing an evaluation and management service by telephone/video and the availability of in person appointments. I also discussed with the patient that there may be a patient responsible charge related to this service. The patient expressed understanding and agreed to proceed.   HPI:   Mary [name] is a 71 y.o. female who presents for virtual visit regarding refills for omeprazole. Last seen in 04/2020.  She has a history of sessile serrated polyp measuring 30 mm, other tubular adenomas which were smaller, chronic gastritis.  See procedure as outlined below.  The last few months, bowels not as they used to be. Usually no issues with constipation or diarrhea. No longer in the "sweet spot".  As soon as she noted issues with constipation, she started increasing her dietary fiber, added a few prunes daily.  Before onset of change in bowels, she had no dietary changes or medication changes. Now with bloating. Having some soft "rocks" every few days. Used to have couple of small BMs per day but now may go 2-3 days without BM. Has more gas.  May get the urge but then cannot have a BM.  Will  go back when stronger urge and then will pass harder stool followed by soft stool at times.  No melena, brbpr.   Chronically has taken docusate sodium 200 mg daily, medication list from last visit and this visit had bisacodyl 5 mg daily but this was inaccurate.  She does not take stimulants or laxatives.  Denies any new medication changes.  Primarily drinks water or unsweetened tea.  Consumes at least 64 ounces of fluid daily.  Chronically on iron, donates blood every 2 months and if she does not take oral iron, she sometimes does not meet cutoff to donate.  She has been trying to lose some weight.  States her PCP recommends 130 pounds.  She feels the best between 135 and 140 pounds.  Was able to get down to 135 pounds when quarantined at the beginning of the pandemic but she has more issues with temptations now, likes chips and sweets. Finds easier to maintain at 140 pounds.    Heartburn doing well.  Noted big improvement when she went from 20 to 40 mg of omeprazole after last office visit.  Occasionally uses Tums for breakthrough symptoms if she drinks strong TV or eats spicy foods.     Colonoscopy May 2022: - Preparation of the colon was fair. - Non-bleeding internal hemorrhoids. - One 2 mm polyp in the cecum, removed with a cold biopsy forceps. Resected and retrieved.  Tubular adenoma. - Post-polypectomy scar in the transverse colon. - Stool in the ascending colon and in the  cecum. -Next colonoscopy in 3 years.  Colonoscopy November 2021: -Two 2 to 3 mm polyps in the cecum, removed with a cold snare. Resected and retrieved.  Tubular adenoma. - One 30 mm polyp in the transverse colon, removed with mucosal resection. Resected and retrieved.  Multiple fragments of sessile serrated polyp. - One 12 mm polyp in the transverse colon, removed with mucosal resection. Resected and retrieved.  Multiple fragments of tubular adenoma. - Mucosal resection was performed. Resection and retrieval were  complete. - Mucosal resection was performed. Resection and retrieval were complete.  EGD November 2021: -Gastritis. Biopsied.  Reactive gastropathy.  No H. pylori. - Normal duodenal bulb, first portion of the duodenum and second portion of the duodenum. Biopsied.  Peptic duodenitis   Current Outpatient Medications  Medication Sig Dispense Refill   acetaminophen (TYLENOL) 500 MG tablet Take 500 mg by mouth every 8 (eight) hours as needed for headache.     Calcium Citrate 250 MG TABS Take 500 mg by mouth 3 (three) times daily.      Coenzyme Q10 200 MG capsule Take 200 mg by mouth daily. ubiquinol     docusate sodium (COLACE) 100 MG capsule Take 200 mg by mouth 2 (two) times daily.     EMGALITY 120 MG/ML SOAJ Inject 120 mg into the skin every 30 (thirty) days.      ezetimibe (ZETIA) 10 MG tablet TAKE 1 TABLET BY MOUTH ONCE A DAY. 90 tablet 2   Ferrous Sulfate (IRON) 325 (65 Fe) MG TABS Take 325 mg by mouth at bedtime.     fexofenadine (ALLEGRA) 180 MG tablet Take 180 mg by mouth daily.     Glucosamine-Chondroitin 750-600 MG TABS Take 1 tablet by mouth 2 (two) times daily.      ipratropium (ATROVENT) 0.06 % nasal spray Place 2 sprays into both nostrils daily.     Magnesium Gluconate 250 MG TABS Take 250 mg by mouth daily at 12 noon.      montelukast (SINGULAIR) 10 MG tablet Take 10 mg by mouth at bedtime.     Multiple Vitamin (MULTIVITAMIN WITH MINERALS) TABS tablet Take 1 tablet by mouth daily. Centrum Silver     MYRBETRIQ 50 MG TB24 tablet Take 50 mg by mouth daily.     Omega-3 Fatty Acids (FISH OIL) 1200 MG CAPS Take 1,200 mg by mouth in the morning and at bedtime.      omeprazole (PRILOSEC) 40 MG capsule Take 1 capsule (40 mg total) by mouth daily. 30 capsule 5   OVER THE COUNTER MEDICATION Take 4 capsules by mouth in the morning and at bedtime. kidney stuff Natural     Polyethyl Glycol-Propyl Glycol (SYSTANE OP) Place 1 drop into both eyes 2 (two) times daily.     pyridOXINE (VITAMIN  B-6) 100 MG tablet Take 100 mg by mouth daily.     rosuvastatin (CRESTOR) 40 MG tablet TAKE 1 TABLET BY MOUTH AT BEDTIME. 90 tablet 3   SUMAtriptan (IMITREX) 100 MG tablet Take 100 mg by mouth as needed for migraine. May repeat in 2 hours if headache persists or recurs.     SUMAtriptan Succinate Refill 4 MG/0.5ML SOCT Inject 4 mg into the skin as needed (for migraines).     Turmeric 500 MG CAPS Take 500 mg by mouth daily.     No current facility-administered medications for this visit.    Past Medical History:  Diagnosis Date   Anemia    Arthritis    Chronic kidney disease  idiopathic micro hematuria    Complication of anesthesia    patient states does not take much medicine to put her to sleep    Edema    ankles to knees    GERD (gastroesophageal reflux disease)    Headache(784.0)    hx of migraines    Hyperlipidemia    Mitral valve prolapse    no symptoms    PONV (postoperative nausea and vomiting)    Scalp alopecia    Seizures (HCC)    1 seizure 45 years ago; unknown etiology and no meds, no seizures since then.    Past Surgical History:  Procedure Laterality Date   BIOPSY  03/24/2020   Procedure: BIOPSY;  Surgeon: Eloise Harman, DO;  [facility]: [facility];  Service: Endoscopy;;  gastric duodenum   CERVICAL FUSION  2006   cervical 5-6    COLONOSCOPY N/A 03/03/2017   Procedure: COLONOSCOPY;  Surgeon: Danie Binder, MD;  [facility]: [facility];  Service: Endoscopy;  Laterality: N/A;  1:15 pm   COLONOSCOPY WITH PROPOFOL N/A 03/24/2020   Procedure: COLONOSCOPY WITH PROPOFOL;  Surgeon: Eloise Harman, DO;  [facility]: [facility];  Service: Endoscopy;  Laterality: N/A;  10:00am   COLONOSCOPY WITH PROPOFOL N/A 10/02/2020   Procedure: COLONOSCOPY WITH PROPOFOL;  Surgeon: Eloise Harman, DO;  [facility];  Service: Endoscopy;  Laterality: N/A;  ASA II / 9:00   ESOPHAGOGASTRODUODENOSCOPY (EGD) WITH PROPOFOL N/A 03/24/2020   Procedure:  ESOPHAGOGASTRODUODENOSCOPY (EGD) WITH PROPOFOL;  Surgeon: Eloise Harman, DO;  [facility];  Service: Endoscopy;  Laterality: N/A;   HEMORRHOID SURGERY N/A 06/23/2017   Procedure: ANOSCOPY WITH REMOVAL OF SINGLE LESION;  Surgeon: [name], MD;  Location: [facility];  Service: General;  Laterality: N/A;   MYOCARDIAL PERFUSION STUDY  07/02/2011   NO SCINTIGRAPHIC EVIDENCE OF INDUCIBLE MYOCARDIAL ISCHEMIA. POST-STRESS EF IS 87%.EXERCISE CAPACITY 9 METS. EKG SHOWS NSR AT 69. THERE IS 1MM HORIZONTAL ST DEPRESSION WITH EXERCISE IN II, III, AVF SEEN BEST AT EARLY RECOVERY. THIS IS PRESENT IN V3-V5 AS WELL AND RETURNS TO BASELINE AT THE END OF THE TEST. NORMAL STUDY.   POLYPECTOMY  03/24/2020   Procedure: POLYPECTOMY;  Surgeon: Eloise Harman, DO;  [facility];  Service: Endoscopy;;  colon    POLYPECTOMY  10/02/2020   Procedure: POLYPECTOMY;  Surgeon: Eloise Harman, DO;  [facility];  Service: Endoscopy;;   RENAL ARTERY DOPPLER  12/28/2003   CELIAC ARTERY: AT REST, 168.4 CM/S; INSPRIATION, 177.6 CM/S. ARCUATE LIGAMENT COMPRESSION SYNDROME. R & L KIDNEYS: RIGHT KIDNEY MEASURES SOME WHAT SMALLER THAN LEFT. KIDNEY ARE ESSENTIALLY SYMMETRICAL IN SHAPE WITH NO OBVIOUS ABNORMALITY VISUALIZED. R & L RENAL ARTERIES: DEMONSTRATE NORMAL SPECTRA. NO SUGGESTION OF DIAMETER REDUCTION, DISSECTION, FIBROMUSCULAR DYSPLASIA OR ANY OTHER VASCULAR ABNOR   TOTAL KNEE ARTHROPLASTY Right 08/31/2013   Procedure: RIGHT TOTAL KNEE ARTHROPLASTY;  Surgeon: [name], MD;  Location: [facility];  Service: Orthopedics;  Laterality: Right;   TOTAL KNEE ARTHROPLASTY Left 03/07/2015   Procedure: TOTAL KNEE ARTHROPLASTY;  Surgeon: [name], MD;  Location: [facility];  Service: Orthopedics;  Laterality: Left;   TRANSTHORACIC ECHOCARDIOGRAM  10/31/3084   LV SYSTOLIC FUNCTION NORMAL, EF=>55%, INSIGNIFICANT PERICARDIAL EFFUSION, LEFT ATRIAL SIZE IS NORMAL, RV SYSTOLIC PRESSURE IS NORMAL. NO SIGN  VALVULAR HEART DISEASE.   TUBAL LIGATION  1986   veins stripping      bilateral legs     Family History  Problem Relation Age of Onset   Dementia Mother    Heart attack Father    Heart disease Father    Kidney failure Father    Heart attack Brother        age 16   Cancer - Other Brother        kidney   Colon cancer Paternal Aunt     Social History   Socioeconomic History   Marital status: Widowed    Spouse name: Not on file   Number of children: Not on file   Years of education: Not on file   Highest education level: Not on file  Occupational History   Not on file  Tobacco Use   Smoking status: Former    Packs/day: 2.00    Years: 22.00    Total pack years: 44.00    Types: Cigarettes    Quit date: 05/13/1988    Years since quitting: 33.4   Smokeless tobacco: Never  Vaping Use   Vaping Use: Never used  Substance and Sexual Activity   Alcohol use: Yes    Comment: wine/beer; couple times/month   Drug use: No   Sexual activity: Not Currently    Birth control/protection: Post-menopausal  Other Topics Concern   Not on file  Social History Narrative   Not on file   Social Determinants of Health   Financial Resource Strain: Not on file  Food Insecurity: Not on file  Transportation Needs: Not on file  Physical Activity: Not on file  Stress: Not on file  Social Connections: Not on file  Intimate Partner Violence: Not on file      ROS:  General: Negative for anorexia, weight loss, fever, chills, fatigue, weakness. Eyes: Negative for vision changes.  ENT: Negative for hoarseness, difficulty swallowing , nasal congestion. CV: Negative for chest pain, angina, palpitations, dyspnea on exertion, peripheral edema.  Respiratory: Negative for dyspnea at rest, dyspnea on exertion, cough, sputum, wheezing.  GI: See history of present illness. GU:  Negative for dysuria, hematuria, urinary incontinence, urinary frequency, nocturnal urination.  MS: Negative for joint  pain, low back pain.  Derm: Negative for rash or itching.  Neuro: Negative for weakness, abnormal sensation, seizure, frequent headaches, memory loss, confusion.  Psych: Negative for anxiety, depression, suicidal ideation, hallucinations.  Endo: Negative for unusual weight change.  Heme: Negative for bruising or bleeding. Allergy: Negative for rash or hives.   Observations/Objective: Pleasant, well-nourished female in no acute distress.  Good color.  No shortness of breath.  Patient states she weighs 149 pounds.  Assessment and Plan:  Chronic GERD/gastritis: Overall doing well on omeprazole 40 mg daily.  She tried getting back down to 20 mg daily if tolerated at some point in the near future.  Initially would like to try to get her bowel movements and bloating improved before making additional changes.  Once her bowel movements are better, she can let me know via MyChart, we can send in omeprazole 20 mg at that time.  Constipation: Associated with bloating.  Colonoscopy up-to-date as outlined.  Initially did improve with increase dietary fiber but still struggling.  Chronically on docusate sodium 200 mg daily.  Etiology of change unclear, she denies any dietary or medication changes.  Remains physically active.  Add MiraLAX 17 g twice daily until soft stool, then continue once daily as needed.  She should take a dose of MiraLAX if she goes more than 24 hours without adequate bowel movement.  She will send a progress report via  MyChart in the next few weeks.  Otherwise we will see her back in 2 years at which time she will be due for surveillance colonoscopy.       Follow Up Instructions:    I discussed the assessment and treatment plan with the patient. The patient was provided an opportunity to ask questions and all were answered. The patient agreed with the plan and demonstrated an understanding of the instructions. AVS mailed to patient's home address.   The patient was advised to call  back or seek an in-person evaluation if the symptoms worsen or if the condition fails to improve as anticipated.  I provided 15 minutes of virtual face-to-face time during this encounter.   Neil Crouch, PA-C

## 2021-12-04 NOTE — Telephone Encounter (Signed)
Replying to patient letting her know that you will respond when you return to the office.

## 2021-12-24 ENCOUNTER — Other Ambulatory Visit: Payer: Self-pay | Admitting: Gastroenterology

## 2021-12-24 MED ORDER — LUBIPROSTONE 8 MCG PO CAPS: ORAL_CAPSULE | ORAL | 1 refills | Status: DC

## 2021-12-24 MED ORDER — LINACLOTIDE 72 MCG PO CAPS: 72.0000 ug | ORAL_CAPSULE | Freq: Every day | ORAL | 1 refills | Status: DC

## 2021-12-24 NOTE — Telephone Encounter (Signed)
Per the PA that I received for the generic Amitiza, the patient must try either, lactulose, movantik or Linzess.

## 2021-12-24 NOTE — Addendum Note (Signed)
Addended by: Mahala Menghini on: 12/24/2021 03:23 PM   Modules accepted: Orders

## 2021-12-24 NOTE — Telephone Encounter (Signed)
Mary [name], since P.A. requires use of lactulose, movantik, or Linzess first, I'll send in rx for Linzess. I sent pt a mychart message.

## 2021-12-31 DIAGNOSIS — Z01419 Encounter for gynecological examination (general) (routine) without abnormal findings: Secondary | ICD-10-CM | POA: Diagnosis not present

## 2021-12-31 DIAGNOSIS — Z6825 Body mass index (BMI) 25.0-25.9, adult: Secondary | ICD-10-CM | POA: Diagnosis not present

## 2022-01-18 DIAGNOSIS — U071 COVID-19: Secondary | ICD-10-CM | POA: Diagnosis not present

## 2022-01-20 MED ORDER — LINACLOTIDE 72 MCG PO CAPS
72.0000 ug | ORAL_CAPSULE | Freq: Every day | ORAL | 3 refills | Status: DC
Start: 2022-01-20 — End: 2023-01-20

## 2022-01-20 NOTE — Addendum Note (Signed)
Addended by: Mahala Menghini on: 01/20/2022 06:23 PM   Modules accepted: Orders

## 2022-01-24 DIAGNOSIS — D225 Melanocytic nevi of trunk: Secondary | ICD-10-CM | POA: Diagnosis not present

## 2022-01-24 DIAGNOSIS — Z1283 Encounter for screening for malignant neoplasm of skin: Secondary | ICD-10-CM | POA: Diagnosis not present

## 2022-01-24 DIAGNOSIS — L570 Actinic keratosis: Secondary | ICD-10-CM | POA: Diagnosis not present

## 2022-01-24 DIAGNOSIS — X32XXXD Exposure to sunlight, subsequent encounter: Secondary | ICD-10-CM | POA: Diagnosis not present

## 2022-01-29 DIAGNOSIS — U071 COVID-19: Secondary | ICD-10-CM | POA: Diagnosis not present

## 2022-01-29 DIAGNOSIS — N183 Chronic kidney disease, stage 3 unspecified: Secondary | ICD-10-CM | POA: Diagnosis not present

## 2022-01-29 DIAGNOSIS — J209 Acute bronchitis, unspecified: Secondary | ICD-10-CM | POA: Diagnosis not present

## 2022-01-29 DIAGNOSIS — I10 Essential (primary) hypertension: Secondary | ICD-10-CM | POA: Diagnosis not present

## 2022-02-01 ENCOUNTER — Other Ambulatory Visit: Payer: Self-pay | Admitting: Cardiovascular Disease

## 2022-03-05 DIAGNOSIS — I700 Atherosclerosis of aorta: Secondary | ICD-10-CM | POA: Diagnosis not present

## 2022-03-05 DIAGNOSIS — E663 Overweight: Secondary | ICD-10-CM | POA: Diagnosis not present

## 2022-03-05 DIAGNOSIS — Z0001 Encounter for general adult medical examination with abnormal findings: Secondary | ICD-10-CM | POA: Diagnosis not present

## 2022-03-05 DIAGNOSIS — Z1159 Encounter for screening for other viral diseases: Secondary | ICD-10-CM | POA: Diagnosis not present

## 2022-03-05 DIAGNOSIS — Z1331 Encounter for screening for depression: Secondary | ICD-10-CM | POA: Diagnosis not present

## 2022-03-05 DIAGNOSIS — Z6825 Body mass index (BMI) 25.0-25.9, adult: Secondary | ICD-10-CM | POA: Diagnosis not present

## 2022-03-05 DIAGNOSIS — I10 Essential (primary) hypertension: Secondary | ICD-10-CM | POA: Diagnosis not present

## 2022-03-05 DIAGNOSIS — N1831 Chronic kidney disease, stage 3a: Secondary | ICD-10-CM | POA: Diagnosis not present

## 2022-03-28 ENCOUNTER — Other Ambulatory Visit: Payer: Self-pay | Admitting: Cardiovascular Disease

## 2022-04-18 ENCOUNTER — Other Ambulatory Visit (HOSPITAL_COMMUNITY): Payer: Self-pay | Admitting: Obstetrics and Gynecology

## 2022-04-18 DIAGNOSIS — Z1231 Encounter for screening mammogram for malignant neoplasm of breast: Secondary | ICD-10-CM

## 2022-04-18 DIAGNOSIS — E039 Hypothyroidism, unspecified: Secondary | ICD-10-CM | POA: Diagnosis not present

## 2022-04-18 DIAGNOSIS — Z0001 Encounter for general adult medical examination with abnormal findings: Secondary | ICD-10-CM | POA: Diagnosis not present

## 2022-04-18 DIAGNOSIS — E559 Vitamin D deficiency, unspecified: Secondary | ICD-10-CM | POA: Diagnosis not present

## 2022-04-18 DIAGNOSIS — D518 Other vitamin B12 deficiency anemias: Secondary | ICD-10-CM | POA: Diagnosis not present

## 2022-04-26 ENCOUNTER — Other Ambulatory Visit (HOSPITAL_COMMUNITY): Payer: Self-pay | Admitting: Internal Medicine

## 2022-04-26 DIAGNOSIS — E2839 Other primary ovarian failure: Secondary | ICD-10-CM

## 2022-05-01 DIAGNOSIS — R7301 Impaired fasting glucose: Secondary | ICD-10-CM | POA: Diagnosis not present

## 2022-05-01 DIAGNOSIS — Z1159 Encounter for screening for other viral diseases: Secondary | ICD-10-CM | POA: Diagnosis not present

## 2022-05-17 ENCOUNTER — Other Ambulatory Visit: Payer: Self-pay | Admitting: Internal Medicine

## 2022-05-20 ENCOUNTER — Ambulatory Visit (HOSPITAL_COMMUNITY)
Admission: RE | Admit: 2022-05-20 | Discharge: 2022-05-20 | Disposition: A | Discharge status: Home / Self Care | Payer: Medicare PPO | Source: Ambulatory Visit | Attending: Internal Medicine | Admitting: Internal Medicine

## 2022-05-20 ENCOUNTER — Ambulatory Visit (HOSPITAL_COMMUNITY)
Admission: RE | Admit: 2022-05-20 | Discharge: 2022-05-20 | Disposition: A | Discharge status: Home / Self Care | Payer: Medicare PPO | Source: Ambulatory Visit | Attending: Obstetrics and Gynecology | Admitting: Obstetrics and Gynecology

## 2022-05-20 DIAGNOSIS — Z1231 Encounter for screening mammogram for malignant neoplasm of breast: Secondary | ICD-10-CM | POA: Insufficient documentation

## 2022-05-20 DIAGNOSIS — E2839 Other primary ovarian failure: Secondary | ICD-10-CM | POA: Diagnosis not present

## 2022-05-20 DIAGNOSIS — Z780 Asymptomatic menopausal state: Secondary | ICD-10-CM | POA: Diagnosis not present

## 2022-05-20 DIAGNOSIS — M8589 Other specified disorders of bone density and structure, multiple sites: Secondary | ICD-10-CM | POA: Diagnosis not present

## 2022-06-28 ENCOUNTER — Other Ambulatory Visit: Payer: Self-pay | Admitting: Cardiovascular Disease

## 2022-07-10 ENCOUNTER — Other Ambulatory Visit (HOSPITAL_COMMUNITY): Payer: Self-pay | Admitting: Family Medicine

## 2022-07-10 ENCOUNTER — Ambulatory Visit (HOSPITAL_COMMUNITY)
Admission: RE | Admit: 2022-07-10 | Discharge: 2022-07-10 | Disposition: A | Discharge status: Home / Self Care | Payer: Medicare PPO | Source: Ambulatory Visit | Attending: Family Medicine | Admitting: Family Medicine

## 2022-07-10 DIAGNOSIS — J45909 Unspecified asthma, uncomplicated: Secondary | ICD-10-CM | POA: Diagnosis not present

## 2022-07-10 DIAGNOSIS — J209 Acute bronchitis, unspecified: Secondary | ICD-10-CM

## 2022-07-10 DIAGNOSIS — E663 Overweight: Secondary | ICD-10-CM | POA: Diagnosis not present

## 2022-07-10 DIAGNOSIS — R0602 Shortness of breath: Secondary | ICD-10-CM | POA: Diagnosis not present

## 2022-07-10 DIAGNOSIS — R059 Cough, unspecified: Secondary | ICD-10-CM | POA: Diagnosis not present

## 2022-07-10 DIAGNOSIS — Z6825 Body mass index (BMI) 25.0-25.9, adult: Secondary | ICD-10-CM | POA: Diagnosis not present

## 2022-07-31 ENCOUNTER — Ambulatory Visit: Payer: Medicare PPO | Attending: Cardiovascular Disease | Admitting: Cardiovascular Disease

## 2022-07-31 ENCOUNTER — Encounter: Payer: Self-pay | Admitting: Cardiovascular Disease

## 2022-07-31 VITALS — BP 134/84 | HR 64 | Ht 64.75 in | Wt 156.8 lb

## 2022-07-31 DIAGNOSIS — I10 Essential (primary) hypertension: Secondary | ICD-10-CM | POA: Diagnosis not present

## 2022-07-31 DIAGNOSIS — Z8669 Personal history of other diseases of the nervous system and sense organs: Secondary | ICD-10-CM

## 2022-07-31 DIAGNOSIS — E7800 Pure hypercholesterolemia, unspecified: Secondary | ICD-10-CM | POA: Diagnosis not present

## 2022-07-31 DIAGNOSIS — K219 Gastro-esophageal reflux disease without esophagitis: Secondary | ICD-10-CM

## 2022-07-31 DIAGNOSIS — Z8249 Family history of ischemic heart disease and other diseases of the circulatory system: Secondary | ICD-10-CM | POA: Diagnosis not present

## 2022-07-31 NOTE — Patient Instructions (Signed)
Medication Instructions:  Your physician recommends that you continue on your current medications as directed. Please refer to the Current Medication list given to you today.  *If you need a refill on your cardiac medications before your next appointment, please call your pharmacy*  Follow-Up: At [facility], you and your health needs are our priority.  As part of our continuing mission to provide you with exceptional heart care, we have created designated Provider Care Teams.  These Care Teams include your primary Cardiologist (physician) and Advanced Practice Providers (APPs -  Physician Assistants and Nurse Practitioners) who all work together to provide you with the care you need, when you need it.  We recommend signing up for the patient portal called "MyChart".  Sign up information is provided on this After Visit Summary.  MyChart is used to connect with patients for Virtual Visits (Telemedicine).  Patients are able to view lab/test results, encounter notes, upcoming appointments, etc.  Non-urgent messages can be sent to your provider as well.   To learn more about what you can do with MyChart, go to [email or web address].    Your next appointment:   12 month(s)  Provider:   Thomas Kelly, MD      

## 2022-07-31 NOTE — Progress Notes (Signed)
Patient ID: Mary [name], female   DOB: [birth year], 72 y.o.   MRN: [id number]     HPI: Mary [name] is a 72 y.o. female presents to the office for an 38 month cardiology evaluation.  Mary [name] has a long-standing history of migraine headaches,  hypertension, hyperlipidemia, GERD, and has strong family history for both coronary as well as peripheral vascular disease. She also has PVD with lower extremity venous abnormalities. Remotely, she had been on pravastatin for hyperlipidemia but this was switched to Crestor do to inadequate response to pravastatin.   When I saw her over one year ago, she was stable on amlodipine 10 mg, benazepril 10 mg for hypetension as well as her migraine headaches and Crestor 40 mg for hyperlipidemia.   She had developed a rash and stopped taking Toprol-XL 12.5 mg.  She denied any palpitations.  Following its discontinuance or awareness of blood pressure elevation.  Over the past year, she has been bothered by chronic bronchitis and allergic cough.  She also underwent left knee replacement in October 2016 after previously undergoing right knee replacement in April 2015.  She has seen Dr. Gerarda Fraction in July and apparently complete set of blood work was done and she was told that these were within normal limits.  She denies any chest pain.  She is unaware of recurrent palpitations.  She denies presyncope or syncope.  She completed 3 courses of antibiotics.  She will be having a follow-up ENT evaluation later this week.  A chest x-ray on 02/12/2016 did not reveal any active cardiopulmonary disease.   She had developed a dry hacking cough and also was having swelling wall on amlodipine/benazepril combination 10/10.  Her neurologist who takes care of her migraines ultimately switched her to candesartan and she is now on 32 mg daily.  She believes her migraine frequency has increased with the change of medication.  Previously when she was on Lotrel.  She would experience 4  migraines per year and since February 2018 had experienced 15, although less intense than previously.  When I saw her, I added back amlodipine at 2.5 mg.  This has resulted in significant reduction in her migraines  She had undergone blood work by Dr. Gerarda Fraction in 2018.  Her cholesterol was 174 with an LDL of 92 and triglycerides of 67.  When I saw her in October 2018, I added Zetia 10 mg to her rosuvastatin 40 mg in attempt to reach an LDL less than 70.    Subsequent blood work has shown benefit and when I last saw her in February 2019  recent LDL 1 week previously was 67.  HbA1c will A1c was minimally increased at 5.8.  These were normal.  She underwent outpatient surgery last week for resection of a small anal wort  She tolerated this well.    I saw her in January 2020.  Prior to that evaluation she had been doing  fairly well but follow-up some episodes of fatigability and chest heaviness in October which led to an evaluation by [name], PAC.  An echo Doppler study on February 17, 2018 showed an EF of 60 to 65%.  She had normal wall motion.  Nuclear perfusion study showed a small defect was felt most likely due to breast attenuation rather than ischemia.  He was low risk study.  Nuclear stress ejection fraction was 67%.  Subsequently, she has kept a log of episodes of chest heaviness.  These typically are nonexertional.  She also has  been monitoring her blood pressure which consistently has been stable.  She brought this log with her to the office  which I reviewed.    She was evaluated by Coletta Memos, NP in August 2020.  Her blood pressure was 138/72 but further improved at home.  She was having ankle swelling and amlodipine was reduced to 2.5 mg.  She continued to be on candesartan 32 mg.  She continues to be on Zetia, rosuvastatin 40 mg, coenzyme every 10 and omega fatty acids.  I saw her on Oct 05, 2019.  Since her last evaluation, at time she had noted some chest wall, musculoskeletal rib discomfort  to her chest.  She is able to walk at least 8000 steps daily and denies any exertional symptomatology.  Her cousin had recently died with abnormality to her pleura.  She she has a strong family history for CAD and PAD.  She denies palpitations, presyncope or syncope.  I suspect that her chest pain was musculoskeletal in etiology.  With her strong family history and recent cousin who died I recommended she undergo coronary CTA for assessment of coronary obstructive disease and hopefully allay some of her concerns for significant CAD.  She underwent coronary CTA on November 09, 2019 which demonstrated a calcium score of 0.  There was no evidence for coronary plaque.  Since I saw her, she was evaluated by Coletta Memos in July 2021 and more recently by Bernerd Pho, PA-C in March 2022.    I last saw her on March 05, 2021 at which time she continued to feel well and denied any recurrent chest discomfort.  She was exercising regularly and doing water aerobics on Monday Wednesday and Friday and [place] 1[place]6 miles 6 days/week.  She has issues with migraines and has been on Emgality injection once a month and is followed by Dr. Drenda Freeze her neurologist.  Since I last saw her, Mary [name] has continued to feel well.  She continues to exercise regularly, she is still doing water aerobics 3 times per week and walking at least 6 days/week on [place].  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  She continues to be on candesartan 8 mg which she states is more for migraines and hypertension.  She also is on Emgality.  Her migraine headaches have occurred less frequently.  He is on Zetia 10 mg and rosuvastatin 40 mg for hyperlipidemia.  She is no longer taking montelukast.  She presents for follow-up evaluation.   Past Medical History:  Diagnosis Date   Anemia    Arthritis    Chronic kidney disease    idiopathic micro hematuria    Complication of anesthesia    patient states does not take  much medicine to put her to sleep    Edema    ankles to knees    GERD (gastroesophageal reflux disease)    Headache(784.0)    hx of migraines    Hyperlipidemia    Mitral valve prolapse    no symptoms    PONV (postoperative nausea and vomiting)    Scalp alopecia    Seizures (HCC)    1 seizure 45 years ago; unknown etiology and no meds, no seizures since then.    Past Surgical History:  Procedure Laterality Date   BIOPSY  03/24/2020   Procedure: BIOPSY;  Surgeon: Eloise Harman, DO;  [facility]: [facility];  Service: Endoscopy;;  gastric duodenum   CERVICAL FUSION  2006  cervical 5-6    COLONOSCOPY N/A 03/03/2017   Procedure: COLONOSCOPY;  Surgeon: Danie Binder, MD;  Location: [facility];  Service: Endoscopy;  Laterality: N/A;  1:15 pm   COLONOSCOPY WITH PROPOFOL N/A 03/24/2020   Procedure: COLONOSCOPY WITH PROPOFOL;  Surgeon: Eloise Harman, DO;  [facility]: [facility];  Service: Endoscopy;  Laterality: N/A;  10:00am   COLONOSCOPY WITH PROPOFOL N/A 10/02/2020   Procedure: COLONOSCOPY WITH PROPOFOL;  Surgeon: Eloise Harman, DO;  [facility]: [facility];  Service: Endoscopy;  Laterality: N/A;  ASA II / 9:00   ESOPHAGOGASTRODUODENOSCOPY (EGD) WITH PROPOFOL N/A 03/24/2020   Procedure: ESOPHAGOGASTRODUODENOSCOPY (EGD) WITH PROPOFOL;  Surgeon: Eloise Harman, DO;  [facility]: [facility];  Service: Endoscopy;  Laterality: N/A;   HEMORRHOID SURGERY N/A 06/23/2017   Procedure: ANOSCOPY WITH REMOVAL OF SINGLE LESION;  Surgeon: [name], MD;  Location: [facility];  Service: General;  Laterality: N/A;   MYOCARDIAL PERFUSION STUDY  07/02/2011   NO SCINTIGRAPHIC EVIDENCE OF INDUCIBLE MYOCARDIAL ISCHEMIA. POST-STRESS EF IS 87%.EXERCISE CAPACITY 9 METS. EKG SHOWS NSR AT 69. THERE IS 1MM HORIZONTAL ST DEPRESSION WITH EXERCISE IN II, III, AVF SEEN BEST AT EARLY RECOVERY. THIS IS PRESENT IN V3-V5 AS WELL AND RETURNS TO BASELINE AT THE END OF THE TEST. NORMAL STUDY.   POLYPECTOMY   03/24/2020   Procedure: POLYPECTOMY;  Surgeon: Eloise Harman, DO;  [facility];  Service: Endoscopy;;  colon    POLYPECTOMY  10/02/2020   Procedure: POLYPECTOMY;  Surgeon: Eloise Harman, DO;  [facility]: [facility];  Service: Endoscopy;;   RENAL ARTERY DOPPLER  12/28/2003   CELIAC ARTERY: AT REST, 168.4 CM/S; INSPRIATION, 177.6 CM/S. ARCUATE LIGAMENT COMPRESSION SYNDROME. R & L KIDNEYS: RIGHT KIDNEY MEASURES SOME WHAT SMALLER THAN LEFT. KIDNEY ARE ESSENTIALLY SYMMETRICAL IN SHAPE WITH NO OBVIOUS ABNORMALITY VISUALIZED. R & L RENAL ARTERIES: DEMONSTRATE NORMAL SPECTRA. NO SUGGESTION OF DIAMETER REDUCTION, DISSECTION, FIBROMUSCULAR DYSPLASIA OR ANY OTHER VASCULAR ABNOR   TOTAL KNEE ARTHROPLASTY Right 08/31/2013   Procedure: RIGHT TOTAL KNEE ARTHROPLASTY;  Surgeon: [name], MD;  Location: [facility];  Service: Orthopedics;  Laterality: Right;   TOTAL KNEE ARTHROPLASTY Left 03/07/2015   Procedure: TOTAL KNEE ARTHROPLASTY;  Surgeon: [name], MD;  Location: [facility];  Service: Orthopedics;  Laterality: Left;   TRANSTHORACIC ECHOCARDIOGRAM  [id number]   LV SYSTOLIC FUNCTION NORMAL, EF=>55%, INSIGNIFICANT PERICARDIAL EFFUSION, LEFT ATRIAL SIZE IS NORMAL, RV SYSTOLIC PRESSURE IS NORMAL. NO SIGN VALVULAR HEART DISEASE.   TUBAL LIGATION  1986   veins stripping      bilateral legs     Allergies  Allergen Reactions   Nsaids Other (See Comments)    elevated-creatinine levels   Metoprolol Rash   Sporanox [Itraconazole] Swelling and Other (See Comments)    Bilateral knee and ankle swelling and pain     Current Outpatient Medications  Medication Sig Dispense Refill   acetaminophen (TYLENOL) 500 MG tablet Take 500 mg by mouth every 8 (eight) hours as needed for headache.     Calcium Citrate 250 MG TABS Take 500 mg by mouth 3 (three) times daily.      Coenzyme Q10 200 MG capsule Take 200 mg by mouth daily. ubiquinol     docusate sodium (COLACE) 100 MG capsule Take 200 mg by mouth  2 (two) times daily.     EMGALITY 120 MG/ML SOAJ Inject 120 mg into the skin every 30 (thirty) days.      ezetimibe (  ZETIA) 10 MG tablet TAKE 1 TABLET BY MOUTH ONCE A DAY. 90 tablet 3   Ferrous Sulfate (IRON) 325 (65 Fe) MG TABS Take 325 mg by mouth at bedtime.     fexofenadine (ALLEGRA) 180 MG tablet Take 180 mg by mouth daily.     Glucosamine-Chondroitin 750-600 MG TABS Take 1 tablet by mouth 2 (two) times daily.      ipratropium (ATROVENT) 0.06 % nasal spray Place 2 sprays into both nostrils daily.     linaclotide (LINZESS) 72 MCG capsule Take 1 capsule (72 mcg total) by mouth daily before breakfast. 90 capsule 3   Magnesium Gluconate 250 MG TABS Take 250 mg by mouth daily at 12 noon.      Multiple Vitamin (MULTIVITAMIN WITH MINERALS) TABS tablet Take 1 tablet by mouth daily. Centrum Silver     MYRBETRIQ 50 MG TB24 tablet Take 50 mg by mouth daily.     Omega-3 Fatty Acids (FISH OIL) 1200 MG CAPS Take 1,200 mg by mouth in the morning and at bedtime.      omeprazole (PRILOSEC) 40 MG capsule TAKE (1) CAPSULE BY MOUTH ONCE DAILY. 30 capsule 5   OVER THE COUNTER MEDICATION Take 4 capsules by mouth in the morning and at bedtime. kidney stuff Natural     Polyethyl Glycol-Propyl Glycol (SYSTANE OP) Place 1 drop into both eyes 2 (two) times daily.     pyridOXINE (VITAMIN B-6) 100 MG tablet Take 100 mg by mouth daily.     rosuvastatin (CRESTOR) 40 MG tablet TAKE 1 TABLET BY MOUTH AT BEDTIME. 90 tablet 0   SUMAtriptan (IMITREX) 100 MG tablet Take 100 mg by mouth as needed for migraine. May repeat in 2 hours if headache persists or recurs.     SUMAtriptan Succinate Refill 4 MG/0.5ML SOCT Inject 4 mg into the skin as needed (for migraines).     Turmeric 500 MG CAPS Take 500 mg by mouth daily.     candesartan (ATACAND) 8 MG tablet Take 8 mg by mouth daily.     montelukast (SINGULAIR) 10 MG tablet Take 10 mg by mouth at bedtime.     polyethylene glycol powder (MIRALAX) 17 GM/SCOOP powder Take one capful  (17 grams) twice daily until soft bowel movement. Then continue one capful daily as needed to maintain regular soft bowel movements. 527 g 11   No current facility-administered medications for this visit.    Social History   Socioeconomic History   Marital status: Widowed    Spouse name: Not on file   Number of children: Not on file   Years of education: Not on file   Highest education level: Not on file  Occupational History   Not on file  Tobacco Use   Smoking status: Former    Packs/day: 2.00    Years: 22.00    Additional pack years: 0.00    Total pack years: 44.00    Types: Cigarettes    Quit date: 05/13/1988    Years since quitting: 34.2   Smokeless tobacco: Never  Vaping Use   Vaping Use: Never used  Substance and Sexual Activity   Alcohol use: Yes    Comment: wine/beer; couple times/month   Drug use: No   Sexual activity: Not Currently    Birth control/protection: Post-menopausal  Other Topics Concern   Not on file  Social History Narrative   Not on file   Social Determinants of Health   Financial Resource Strain: Not on file  Food Insecurity: Not  on file  Transportation Needs: Not on file  Physical Activity: Not on file  Stress: Not on file  Social Connections: Not on file  Intimate Partner Violence: Not on file   Social history is notable that she is married and has remained active. She does water aerobics and also rides a bicycle. She does not smoke, but there is a remote history of tobacco use having quit approximately 27 years ago. She has 2 children. She is the sister-in-law of Dr. Adella Hare and is married to Dr. Daylene Posey wife's brother.  She is retired Chief Technology Officer and retired at age 38.  Family History  Problem Relation Age of Onset   Dementia Mother    Heart attack Father    Heart disease Father    Kidney failure Father    Heart attack Brother        age 79   Cancer - Other Brother        kidney   Colon cancer Paternal Aunt     ROS General: Negative; No fevers, chills, or night sweats;  HEENT: Negative; No changes in vision or hearing, sinus congestion, difficulty swallowing Pulmonary: Negative; No cough, wheezing, shortness of breath, hemoptysis Cardiovascular: See history of present illness GI: Negative; No nausea, vomiting, diarrhea, or abdominal pain GU: Colonoscopy October 2018, status post outpatient surgery for an small anal wart Musculoskeletal: Negative; no myalgias, joint pain, or weakness Hematologic/Oncology: Negative; no easy bruising, bleeding Endocrine: Negative; no heat/cold intolerance; no diabetes Neuro: Positive for history of migraines, currently treated with Emgality with good benefit but she had experienced 3 mild episodes over the past month Skin: Negative; No rashes or skin lesions Psychiatric: Negative; No behavioral problems, depression Sleep: Negative; No snoring, daytime sleepiness, hypersomnolence, bruxism, restless legs, hypnogognic hallucinations, no cataplexy Other comprehensive 14 point system review is negative.   PE BP 134/84   Pulse 64   Ht 5' 4.75" (1.645 m)   Wt 156 lb 12.8 oz (71.1 kg)   SpO2 99%   BMI 26.29 kg/m    Repeat blood pressure by me was 114/70  Wt Readings from Last 3 Encounters:  07/31/22 156 lb 12.8 oz (71.1 kg)  10/30/21 149 lb (67.6 kg)  03/05/21 154 lb 12.8 oz (70.2 kg)   General: Alert, oriented, no distress.  Skin: normal turgor, no rashes, warm and dry HEENT: Normocephalic, atraumatic. Pupils equal round and reactive to light; sclera anicteric; extraocular muscles intact;  Nose without nasal septal hypertrophy Mouth/Parynx benign; Mallinpatti scale 2 Neck: No JVD, no carotid bruits; normal carotid upstroke Lungs: clear to ausculatation and percussion; no wheezing or rales Chest wall: without tenderness to palpitation Heart: PMI not displaced, RRR, s1 s2 normal, 1/6 systolic murmur, no diastolic murmur, no rubs, gallops, thrills, or  heaves Abdomen: soft, nontender; no hepatosplenomehaly, BS+; abdominal aorta nontender and not dilated by palpation. Back: no CVA tenderness Pulses 2+ Musculoskeletal: full range of motion, normal strength, no joint deformities Extremities: no clubbing cyanosis or edema, Homan's sign negative  Neurologic: grossly nonfocal; Cranial nerves grossly wnl Psychologic: Normal mood and affect    July 31, 2022 ECG (independently read by me): NSR at 64, LAE, nonspecific T wave abnormality     March 05, 2021 ECG (independently read by me):  NSR at 61, no ectopy,   May 2021 ECG (independently read by me): Normal sinus rhythm with mild sinus arrhythmia at 67 bpm.  Normal intervals.  No ectopy no ST segment changes  January 2020 ECG (independently read  by me): Sinus rhythm with PACs with ventricular rate of 65 bpm.  No ectopy.  February 2019 ECG (independently read by me): Normal sinus rhythm with PAC.  PR interval 172 ms.  Nonspecific ST changes.  QTc interval 379 ms.  October 2018 ECG (independently read by me): Normal sinus rhythm at 69 bpm.  Nonspecific ST changes.  PR interval 156 more seconds, QTc interval 390 ms.  October 2017 ECG (independently read by me): Normal sinus rhythm at 64 bpm.  July 2016 ECG (independently read by me):  Normal sinus rhythm at 72 bpm. Borderline left atrial enlargement.  January 2016 ECG (independently read by me): Normal sinus rhythm at 69 bpm.  Normal intervals.  No significant ST segment changes.  January 2015 ECG (independently read by me): Normal sinus rhythm with nonspecific ST-T changes. Heart rate 75 beats per minute.  LABS:  I personally reviewed the repeat laboratory done on 01/28/2017 by Dr. Gerarda Fraction.  Lipid studies from February 04, 2018 reveal a total cholesterol 132, HDL 55, LDL 60, triglycerides 87.        Latest Ref Rng & Units 11/04/2019    9:31 AM 07/01/2017    9:32 AM 06/19/2017    2:31 PM  BMP  Glucose 65 - 99 mg/dL 100  93  123    BUN 8 - 27 mg/dL 22  22  27    Creatinine 0.57 - 1.00 mg/dL 1.21  1.13  1.33   BUN/Creat Ratio 12 - 28 18  19     Sodium 134 - 144 mmol/L 141  141  135   Potassium 3.5 - 5.2 mmol/L 4.5  4.8  4.7   Chloride 96 - 106 mmol/L 107  107  102   CO2 20 - 29 mmol/L 22  20  23    Calcium 8.7 - 10.3 mg/dL 9.3  9.7  9.4       Latest Ref Rng & Units 07/01/2017    9:32 AM 12/29/2014    8:52 AM 05/10/2014    8:45 AM  Hepatic Function  Total Protein 6.0 - 8.5 g/dL 6.9  6.8  6.9   Albumin 3.6 - 4.8 g/dL 4.3  3.9  4.1   AST 0 - 40 IU/L 23  19  19    ALT 0 - 32 IU/L 16  17  17    Alk Phosphatase 39 - 117 IU/L 63  73  69   Total Bilirubin 0.0 - 1.2 mg/dL 0.4  0.4  0.4       Latest Ref Rng & Units 01/21/2020   10:37 AM 06/19/2017    2:31 PM 03/03/2017   10:19 AM  CBC  WBC 3.8 - 10.8 Thousand/uL 5.5  5.6  4.5   Hemoglobin 11.7 - 15.5 g/dL 13.4  11.8  10.9   Hematocrit 35.0 - 45.0 % 40.7  37.0  33.4   Platelets 140 - 400 Thousand/uL 204  234  221    Lab Results  Component Value Date   MCV 91.9 01/21/2020   MCV 91.6 06/19/2017   MCV 91.5 03/03/2017   Lab Results  Component Value Date   TSH 2.014 12/29/2014   Lipid Panel     Component Value Date/Time   CHOL 136 07/01/2017 0932   TRIG 49 07/01/2017 0932   HDL 59 07/01/2017 0932   CHOLHDL 2.3 07/01/2017 0932   CHOLHDL 2.3 12/29/2014 0852   VLDL 11 12/29/2014 0852   LDLCALC 67 07/01/2017 0932     RADIOLOGY: No results found.  IMPRESSION:  1. Essential hypertension   2. Hx of migraine headaches   3. Pure hypercholesterolemia   4. Family history of heart disease   5. Gastroesophageal reflux disease without esophagitis      ASSESSMENT AND PLAN: Mary [name] is 72 year-old female who has a long-standing history of hypertension as well as hyperlipidemia.  She developed a nonproductive cough on Lotrel, which led to discontinuance of ACE inhibition.  She had been on a regimen consisting of low-dose amlodipine as well as candesartan.  She  is on rosuvastatin 40 mg and Zetia for hyperlipidemia.  Since I last saw her in October 2022, she is no longer taking amlodipine but has continued to take candesartan 8 mg which she states is also helpful for her migraine headaches.  She had experienced some atypical chest pain in the past which most likely was musculoskeletal in etiology.  A coronary CTA showed a calcium score of 0.  Continues to exercise regularly and does water aerobics 3 days/week and walks the [place] [address] 1.6 miles 6 days/week.  He is followed by Dr. Riley Kill.  She continues also to be on Emgality for her migraine.  She is no longer taking montelukast and she does continue to take ipratropium nasal spray.  She takes omeprazole for GERD.  Her blood pressure today is stable on current therapy and on repeat by me was 114/70 which she states is where her blood pressure at home typically runs.  She had undergone laboratory in December 2023.  Total cholesterol was 160 HDL 60 triglycerides 81 and LDL 85.  Creatinine was normal at 1.2.  TSH was normal at 2.7.  Clinically she is doing well.  I will see her in 1 year for follow-up evaluation or sooner as needed.   Troy Sine, MD, [facility]  07/31/2022 5:50 PM

## 2022-08-12 DIAGNOSIS — H532 Diplopia: Secondary | ICD-10-CM | POA: Diagnosis not present

## 2022-08-12 DIAGNOSIS — G43109 Migraine with aura, not intractable, without status migrainosus: Secondary | ICD-10-CM | POA: Diagnosis not present

## 2022-08-12 DIAGNOSIS — G7000 Myasthenia gravis without (acute) exacerbation: Secondary | ICD-10-CM | POA: Diagnosis not present

## 2022-08-30 DIAGNOSIS — H532 Diplopia: Secondary | ICD-10-CM | POA: Diagnosis not present

## 2022-08-30 DIAGNOSIS — R9089 Other abnormal findings on diagnostic imaging of central nervous system: Secondary | ICD-10-CM | POA: Diagnosis not present

## 2022-08-30 DIAGNOSIS — G43109 Migraine with aura, not intractable, without status migrainosus: Secondary | ICD-10-CM | POA: Diagnosis not present

## 2022-08-30 DIAGNOSIS — G7000 Myasthenia gravis without (acute) exacerbation: Secondary | ICD-10-CM | POA: Diagnosis not present

## 2022-09-17 DIAGNOSIS — H532 Diplopia: Secondary | ICD-10-CM | POA: Diagnosis not present

## 2022-09-17 DIAGNOSIS — G7000 Myasthenia gravis without (acute) exacerbation: Secondary | ICD-10-CM | POA: Diagnosis not present

## 2022-09-17 DIAGNOSIS — G43109 Migraine with aura, not intractable, without status migrainosus: Secondary | ICD-10-CM | POA: Diagnosis not present

## 2022-09-18 DIAGNOSIS — R319 Hematuria, unspecified: Secondary | ICD-10-CM | POA: Diagnosis not present

## 2022-09-18 DIAGNOSIS — N898 Other specified noninflammatory disorders of vagina: Secondary | ICD-10-CM | POA: Diagnosis not present

## 2022-10-09 ENCOUNTER — Other Ambulatory Visit: Payer: Self-pay | Admitting: Cardiovascular Disease

## 2022-10-17 DIAGNOSIS — E663 Overweight: Secondary | ICD-10-CM | POA: Diagnosis not present

## 2022-10-17 DIAGNOSIS — R413 Other amnesia: Secondary | ICD-10-CM | POA: Diagnosis not present

## 2022-10-17 DIAGNOSIS — G7000 Myasthenia gravis without (acute) exacerbation: Secondary | ICD-10-CM | POA: Diagnosis not present

## 2022-10-17 DIAGNOSIS — I10 Essential (primary) hypertension: Secondary | ICD-10-CM | POA: Diagnosis not present

## 2022-10-17 DIAGNOSIS — Z6825 Body mass index (BMI) 25.0-25.9, adult: Secondary | ICD-10-CM | POA: Diagnosis not present

## 2022-10-17 DIAGNOSIS — N1831 Chronic kidney disease, stage 3a: Secondary | ICD-10-CM | POA: Diagnosis not present

## 2022-10-17 DIAGNOSIS — I700 Atherosclerosis of aorta: Secondary | ICD-10-CM | POA: Diagnosis not present

## 2022-10-17 DIAGNOSIS — J45909 Unspecified asthma, uncomplicated: Secondary | ICD-10-CM | POA: Diagnosis not present

## 2022-11-12 DIAGNOSIS — J310 Chronic rhinitis: Secondary | ICD-10-CM | POA: Diagnosis not present

## 2022-11-12 DIAGNOSIS — R0982 Postnasal drip: Secondary | ICD-10-CM | POA: Diagnosis not present

## 2022-11-12 DIAGNOSIS — J343 Hypertrophy of nasal turbinates: Secondary | ICD-10-CM | POA: Diagnosis not present

## 2022-11-18 ENCOUNTER — Other Ambulatory Visit: Payer: Self-pay | Admitting: Gastroenterology

## 2023-01-01 ENCOUNTER — Other Ambulatory Visit: Payer: Self-pay | Admitting: Cardiovascular Disease

## 2023-01-09 DIAGNOSIS — Z1151 Encounter for screening for human papillomavirus (HPV): Secondary | ICD-10-CM | POA: Diagnosis not present

## 2023-01-09 DIAGNOSIS — Z124 Encounter for screening for malignant neoplasm of cervix: Secondary | ICD-10-CM | POA: Diagnosis not present

## 2023-01-09 DIAGNOSIS — Z6825 Body mass index (BMI) 25.0-25.9, adult: Secondary | ICD-10-CM | POA: Diagnosis not present

## 2023-01-20 ENCOUNTER — Other Ambulatory Visit: Payer: Self-pay | Admitting: Gastroenterology

## 2023-01-23 ENCOUNTER — Ambulatory Visit: Payer: Medicare PPO | Admitting: Neurology

## 2023-01-23 ENCOUNTER — Encounter: Payer: Self-pay | Admitting: Neurology

## 2023-01-23 VITALS — BP 120/62 | HR 73 | Ht 64.75 in | Wt 152.5 lb

## 2023-01-23 DIAGNOSIS — R269 Unspecified abnormalities of gait and mobility: Secondary | ICD-10-CM

## 2023-01-23 DIAGNOSIS — H532 Diplopia: Secondary | ICD-10-CM | POA: Insufficient documentation

## 2023-01-23 NOTE — Progress Notes (Signed)
Chief Complaint  Patient presents with   New Patient (Initial Visit)    Rm15, stepdaughter [facility]) was recently diagnosed MG by [facility] neuroeye who is now on medical leave till December she has lots of questions for mg, Memory loss:moca was 26      ASSESSMENT AND PLAN  Mary [name] is a 72 y.o. female   Long history of binocular diplopia with acute worsening in January 2024,  There was no bulbar limb muscle weakness noted,  Acetylcholine receptor binding, MuSK antibody was negative,  No benefit with Mestinon  Differentiation diagnosis including seronegative ocular myasthenia gravis, versus other extraocular eye movement disorder,  Refer to [facility] for single-fiber EMG Worsening gait abnormality  History of cervical decompression surgery  Hyperreflexia on examination  MRI of cervical spine to rule out cervical spondylitic myelopathy  DIAGNOSTIC DATA (LABS, IMAGING, TESTING) - I reviewed patient records, labs, notes, testing and imaging myself where available.   MEDICAL HISTORY:  Mary [name], is a 72 year old female, accompanied by her daughter Mary [name] seen in request by her primary care physician Dr. Elfredia Nevins, for evaluation of diplopia, worsening gait abnormality, initial evaluation January 23, 2023  I reviewed and summarized the referring note. PMHX Chronic migraine  She reported sudden onset double vision in May 2002, while traveling driving on highway with her husband at [employer] lane traffic became 8 lane, she had a persistent diplopia since then, she described fairly constant relationship of 2 images, imaging on the right lower side is real, left upper side is shadow imaging,  She was given prescription of Prisma, over the years, she was able to adapt to it, better with near vision, worse with far vision, but over the past few years, she had new glasses made almost on a yearly basis with increased demanding of Prisma,  She  had sudden worsening of double vision in January 2024, despite higher level Prisma without correcting her vision, she was referred by her optometrist due to [facility] neuro-ophthalmologist Dr. Margaree Mackintosh, there was mild change in her examination during to visit with Dr.Szatmary in April and May 2024, was given the diagnosis of ocular myasthenia gravis, but the acetylcholine binding and musk antibody was all negative  She had a history of cervical decompression surgery in the past, since 2024, she noticed the gait abnormality, used to walk couple miles each day without any difficulty, last 2 months, she has to rest multiple times to complete her routine, shortness of breath with exertion, she denies dysphagia, swallowing difficulty  MRI of the brain with without contrast August 30, 2022: Scattered T2/FLAIR hyperintensity consistent with small vessel disease, no acute abnormality  Myasthenia gravis laboratory evaluation, acetylcholine binding antibody was negative, musk antibody was negative, TSH was normal 2.505, free T4 within normal range 1.14,  Plan at reevaluation April 19, 2022, normal CMP creatinine was 1.22 GFR 47, LDL 85, hemoglobin of 12, normal TSH, B12 771 folic acid 18.8, A1c 5.9  PHYSICAL EXAM:   Vitals:   01/23/23 1054  BP: 120/62  Pulse: 73  Weight: 152 lb 8 oz (69.2 kg)  Height: 5' 4.75" (1.645 m)   Body mass index is 25.57 kg/m.  PHYSICAL EXAMNIATION:  Gen: NAD, conversant, well nourised, well groomed                     Cardiovascular: Regular rate rhythm, no peripheral edema, warm, nontender. Eyes: Conjunctivae clear without exudates or hemorrhage Neck: Supple, no carotid bruits.  Pulmonary: Clear to auscultation bilaterally   NEUROLOGICAL EXAM:  MENTAL STATUS: Speech/cognition: Awake, alert, oriented to history taking and casual conversation CRANIAL NERVES: CN II: Visual fields are full to confrontation. Pupils are round equal and briskly reactive to light. CN III, IV,  VI: extraocular movement are normal.  Cover and uncover testing showed right more than left bilateral esophoria, no ptosis. CN V: Facial sensation is intact to light touch CN VII: Face is symmetric with normal eye closure  CN VIII: Hearing is normal to causal conversation. CN IX, X: Phonation is normal. CN XI: Head turning and shoulder shrug are intact  MOTOR: No neck flexion, upper extremity proximal and distal muscle weakness, mild bilateral hip flexion weakness  REFLEXES: Reflexes are 2+ and symmetric at the biceps, triceps, 3/3knees, and ankles. Plantar responses are extensor bilaterally SENSORY: Intact to light touch, pinprick and vibratory sensation are intact in fingers and toes.  COORDINATION: There is no trunk or limb dysmetria noted.  GAIT/STANCE: Need push-up to get up from seated position, mildly low doses, unsteady,  REVIEW OF SYSTEMS:  Full 14 system review of systems performed and notable only for as above All other review of systems were negative.   ALLERGIES: Allergies  Allergen Reactions   Itraconazole Other (See Comments) and Swelling    Bilateral knee and ankle swelling and pain  Other Reaction(s): Not available   Amlodipine    Amlodipine Besy-Benazepril Hcl Cough   Nsaids Other (See Comments)    elevated-creatinine levels  Other Reaction(s): Not available   Metoprolol Rash    Other Reaction(s): Not available    HOME MEDICATIONS: Current Outpatient Medications  Medication Sig Dispense Refill   acetaminophen (TYLENOL) 500 MG tablet Take 500 mg by mouth every 8 (eight) hours as needed for headache.     Calcium Citrate 250 MG TABS Take 500 mg by mouth 3 (three) times daily.      candesartan (ATACAND) 8 MG tablet Take 8 mg by mouth daily.     Coenzyme Q10 200 MG capsule Take 200 mg by mouth daily. ubiquinol     docusate sodium (COLACE) 100 MG capsule Take 200 mg by mouth daily.     EMGALITY 120 MG/ML SOAJ Inject 120 mg into the skin every 30 (thirty)  days.      ezetimibe (ZETIA) 10 MG tablet TAKE 1 TABLET BY MOUTH ONCE A DAY. 90 tablet 3   Ferrous Sulfate (IRON) 325 (65 Fe) MG TABS Take 325 mg by mouth at bedtime.     fexofenadine (ALLEGRA) 180 MG tablet Take 180 mg by mouth daily.     ipratropium (ATROVENT) 0.06 % nasal spray Place 2 sprays into both nostrils daily.     linaclotide (LINZESS) 72 MCG capsule TAKE 1 CAPSULE BY MOUTH ONCE DAILY BEFORE BREAKFAST. 90 capsule 5   Magnesium Gluconate 250 MG TABS Take 250 mg by mouth daily at 12 noon.      Multiple Vitamin (MULTIVITAMIN WITH MINERALS) TABS tablet Take 1 tablet by mouth daily. Centrum Silver     MYRBETRIQ 50 MG TB24 tablet Take 50 mg by mouth daily.     omeprazole (PRILOSEC) 40 MG capsule TAKE (1) CAPSULE BY MOUTH ONCE DAILY. 30 capsule 5   OVER THE COUNTER MEDICATION Take 4 capsules by mouth in the morning and at bedtime. kidney stuff Natural     Polyethyl Glycol-Propyl Glycol (SYSTANE OP) Place 1 drop into both eyes 2 (two) times daily.     pyridOXINE (VITAMIN B-6) 100 MG  tablet Take 100 mg by mouth daily.     rosuvastatin (CRESTOR) 40 MG tablet TAKE 1 TABLET BY MOUTH AT BEDTIME. 90 tablet 3   SUMAtriptan (IMITREX) 100 MG tablet Take 100 mg by mouth as needed for migraine. May repeat in 2 hours if headache persists or recurs.     SUMAtriptan Succinate Refill 4 MG/0.5ML SOCT Inject 4 mg into the skin as needed (for migraines).     No current facility-administered medications for this visit.    PAST MEDICAL HISTORY: Past Medical History:  Diagnosis Date   Anemia    Arthritis    Chronic kidney disease    idiopathic micro hematuria    Complication of anesthesia    patient states does not take much medicine to put her to sleep    Edema    ankles to knees    GERD (gastroesophageal reflux disease)    Headache(784.0)    hx of migraines    Hyperlipidemia    Mitral valve prolapse    no symptoms    Myasthenia gravis (HCC)    PONV (postoperative nausea and vomiting)    Scalp  alopecia    Seizures (HCC)    1 seizure 45 years ago; unknown etiology and no meds, no seizures since then.    PAST SURGICAL HISTORY: Past Surgical History:  Procedure Laterality Date   BIOPSY  03/24/2020   Procedure: BIOPSY;  Surgeon: Lanelle Bal, DO;  [facility]: [facility];  Service: Endoscopy;;  gastric duodenum   CERVICAL FUSION  2006   cervical 5-6    COLONOSCOPY N/A 03/03/2017   Procedure: COLONOSCOPY;  Surgeon: West Bali, MD;  [facility]: [facility];  Service: Endoscopy;  Laterality: N/A;  1:15 pm   COLONOSCOPY WITH PROPOFOL N/A 03/24/2020   Procedure: COLONOSCOPY WITH PROPOFOL;  Surgeon: Lanelle Bal, DO;  [facility];  Service: Endoscopy;  Laterality: N/A;  10:00am   COLONOSCOPY WITH PROPOFOL N/A 10/02/2020   Procedure: COLONOSCOPY WITH PROPOFOL;  Surgeon: Lanelle Bal, DO;  [facility];  Service: Endoscopy;  Laterality: N/A;  ASA II / 9:00   ESOPHAGOGASTRODUODENOSCOPY (EGD) WITH PROPOFOL N/A 03/24/2020   Procedure: ESOPHAGOGASTRODUODENOSCOPY (EGD) WITH PROPOFOL;  Surgeon: Lanelle Bal, DO;  [facility];  Service: Endoscopy;  Laterality: N/A;   HEMORRHOID SURGERY N/A 06/23/2017   Procedure: ANOSCOPY WITH REMOVAL OF SINGLE LESION;  Surgeon: Franky Macho, MD;  Location: [facility];  Service: General;  Laterality: N/A;   MYOCARDIAL PERFUSION STUDY  07/02/2011   NO SCINTIGRAPHIC EVIDENCE OF INDUCIBLE MYOCARDIAL ISCHEMIA. POST-STRESS EF IS 87%.EXERCISE CAPACITY 9 METS. EKG SHOWS NSR AT 69. THERE IS HORIZONTAL ST DEPRESSION WITH EXERCISE IN II, III, AVF SEEN BEST AT EARLY RECOVERY. THIS IS PRESENT IN V3-V5 AS WELL AND RETURNS TO BASELINE AT THE END OF THE TEST. NORMAL STUDY.   POLYPECTOMY  03/24/2020   Procedure: POLYPECTOMY;  Surgeon: Lanelle Bal, DO;  [facility];  Service: Endoscopy;;  colon    POLYPECTOMY  10/02/2020   Procedure: POLYPECTOMY;  Surgeon: Lanelle Bal, DO;  [facility];   Service: Endoscopy;;   RENAL ARTERY DOPPLER  12/28/2003   CELIAC ARTERY: AT REST, 168.4 CM/S; INSPRIATION, 177.6 CM/S. ARCUATE LIGAMENT COMPRESSION SYNDROME. R & L KIDNEYS: RIGHT KIDNEY MEASURES SOME WHAT SMALLER THAN LEFT. KIDNEY ARE ESSENTIALLY SYMMETRICAL IN SHAPE WITH NO OBVIOUS ABNORMALITY VISUALIZED. R & L RENAL ARTERIES: DEMONSTRATE NORMAL SPECTRA. NO SUGGESTION OF DIAMETER REDUCTION, DISSECTION,  FIBROMUSCULAR DYSPLASIA OR ANY OTHER VASCULAR ABNOR   TOTAL KNEE ARTHROPLASTY Right 08/31/2013   Procedure: RIGHT TOTAL KNEE ARTHROPLASTY;  Surgeon: Shelda Pal, MD;  Location: [facility];  Service: Orthopedics;  Laterality: Right;   TOTAL KNEE ARTHROPLASTY Left 03/07/2015   Procedure: TOTAL KNEE ARTHROPLASTY;  Surgeon: Durene Romans, MD;  Location: [facility];  Service: Orthopedics;  Laterality: Left;   TRANSTHORACIC ECHOCARDIOGRAM  07/02/2011   LV SYSTOLIC FUNCTION NORMAL, EF=>55%, INSIGNIFICANT PERICARDIAL EFFUSION, LEFT ATRIAL SIZE IS NORMAL, RV SYSTOLIC PRESSURE IS NORMAL. NO SIGN VALVULAR HEART DISEASE.   TUBAL LIGATION  1986   veins stripping      bilateral legs     FAMILY HISTORY: Family History  Problem Relation Age of Onset   Dementia Mother    Heart attack Father    Heart disease Father    Kidney failure Father    Heart attack Brother        age 68   Cancer - Other Brother        kidney   Colon cancer Paternal Aunt     SOCIAL HISTORY: Social History   Socioeconomic History   Marital status: Widowed    Spouse name: Not on file   Number of children: Not on file   Years of education: Not on file   Highest education level: Not on file  Occupational History   Not on file  Tobacco Use   Smoking status: Former    Current packs/day: 0.00    Average packs/day: 2.0 packs/day for 22.0 years (44.0 ttl pk-yrs)    Types: Cigarettes    Start date: 05/13/1966    Quit date: 05/13/1988    Years since quitting: 34.7   Smokeless tobacco: Never  Vaping Use   Vaping status: Never Used   Substance and Sexual Activity   Alcohol use: Yes    Comment: wine/beer; couple times/month   Drug use: No   Sexual activity: Not Currently    Birth control/protection: Post-menopausal  Other Topics Concern   Not on file  Social History Narrative   Not on file   Social Determinants of Health   Financial Resource Strain: Not on file  Food Insecurity: Not on file  Transportation Needs: Not on file  Physical Activity: Not on file  Stress: Not on file  Social Connections: Not on file  Intimate Partner Violence: Not on file      Levert Feinstein, M.D. Ph.D.  [facility] [address] [place], [place] [address] Ph: [phone] Fax: [phone]  CC:  Elfredia Nevins, MD [address] [place],  [place] [address]  Elfredia Nevins, MD

## 2023-01-24 ENCOUNTER — Telehealth: Payer: Self-pay | Admitting: Neurology

## 2023-01-24 NOTE — Telephone Encounter (Signed)
Referral for neurology fax to [facility]. Phone: [phone], Fax: [phone]

## 2023-01-29 ENCOUNTER — Telehealth: Payer: Self-pay | Admitting: Neurology

## 2023-01-29 NOTE — Telephone Encounter (Signed)
Cohere Berkley Harvey: [phone] exp. 01/29/23-03/30/23 sent to [facility] [phone]

## 2023-01-30 ENCOUNTER — Encounter: Payer: Self-pay | Admitting: Neurology

## 2023-02-03 NOTE — Telephone Encounter (Signed)
I just spoke to the patient and I cancelled the MRI at [facility] for her and she is going to schedule at [facility], I will change the location on the auth.

## 2023-02-06 ENCOUNTER — Ambulatory Visit (HOSPITAL_COMMUNITY)
Admission: RE | Admit: 2023-02-06 | Discharge: 2023-02-06 | Disposition: A | Discharge status: Home / Self Care | Payer: Medicare PPO | Source: Ambulatory Visit | Attending: Neurology | Admitting: Neurology

## 2023-02-06 DIAGNOSIS — M5023 Other cervical disc displacement, cervicothoracic region: Secondary | ICD-10-CM | POA: Diagnosis not present

## 2023-02-06 DIAGNOSIS — M47812 Spondylosis without myelopathy or radiculopathy, cervical region: Secondary | ICD-10-CM | POA: Diagnosis not present

## 2023-02-06 DIAGNOSIS — M4802 Spinal stenosis, cervical region: Secondary | ICD-10-CM | POA: Diagnosis not present

## 2023-02-06 DIAGNOSIS — H532 Diplopia: Secondary | ICD-10-CM | POA: Diagnosis not present

## 2023-02-06 DIAGNOSIS — R269 Unspecified abnormalities of gait and mobility: Secondary | ICD-10-CM | POA: Diagnosis not present

## 2023-02-06 DIAGNOSIS — M5021 Other cervical disc displacement,  high cervical region: Secondary | ICD-10-CM | POA: Diagnosis not present

## 2023-02-24 ENCOUNTER — Encounter (INDEPENDENT_AMBULATORY_CARE_PROVIDER_SITE_OTHER): Payer: Medicare PPO | Admitting: Neurology

## 2023-02-24 DIAGNOSIS — H532 Diplopia: Secondary | ICD-10-CM | POA: Diagnosis not present

## 2023-02-24 NOTE — Telephone Encounter (Signed)
Please see the MyChart message reply(ies) for my assessment and plan.    This patient gave consent for this Medical Advice Message and is aware that it may result in a bill to [employer], as well as the possibility of receiving a bill for a co-payment or deductible. They are an established patient, but are not seeking medical advice exclusively about a problem treated during an in person or video visit in the last seven days. I did not recommend an in person or video visit within seven days of my reply.    I spent a total of 5 minutes cumulative time within 7 days through [employer].  Levert Feinstein, MD

## 2023-02-25 ENCOUNTER — Encounter: Payer: Self-pay | Admitting: Neurology

## 2023-02-28 ENCOUNTER — Other Ambulatory Visit: Payer: Medicare PPO

## 2023-03-02 ENCOUNTER — Other Ambulatory Visit: Payer: Self-pay | Admitting: Cardiovascular Disease

## 2023-03-10 ENCOUNTER — Encounter: Payer: Self-pay | Admitting: Neurology

## 2023-03-10 DIAGNOSIS — M542 Cervicalgia: Secondary | ICD-10-CM

## 2023-03-10 DIAGNOSIS — H532 Diplopia: Secondary | ICD-10-CM

## 2023-03-10 DIAGNOSIS — R269 Unspecified abnormalities of gait and mobility: Secondary | ICD-10-CM

## 2023-03-13 DIAGNOSIS — M542 Cervicalgia: Secondary | ICD-10-CM | POA: Insufficient documentation

## 2023-03-13 NOTE — Addendum Note (Signed)
Addended by: Levert Feinstein on: 03/13/2023 10:19 AM   Modules accepted: Orders

## 2023-03-13 NOTE — Telephone Encounter (Signed)
Please see the MyChart message reply(ies) for my assessment and plan.    This patient gave consent for this Medical Advice Message and is aware that it may result in a bill to [employer], as well as the possibility of receiving a bill for a co-payment or deductible. They are an established patient, but are not seeking medical advice exclusively about a problem treated during an in person or video visit in the last seven days. I did not recommend an in person or video visit within seven days of my reply.    I spent a total of 7 minutes cumulative time within 7 days through [employer].  Marcial Pacas, MD

## 2023-04-03 ENCOUNTER — Encounter: Payer: Self-pay | Admitting: Neurology

## 2023-04-07 ENCOUNTER — Other Ambulatory Visit (HOSPITAL_COMMUNITY): Payer: Self-pay | Admitting: Obstetrics and Gynecology

## 2023-04-07 DIAGNOSIS — Z1231 Encounter for screening mammogram for malignant neoplasm of breast: Secondary | ICD-10-CM

## 2023-04-15 DIAGNOSIS — H538 Other visual disturbances: Secondary | ICD-10-CM | POA: Diagnosis not present

## 2023-04-15 DIAGNOSIS — H25813 Combined forms of age-related cataract, bilateral: Secondary | ICD-10-CM | POA: Diagnosis not present

## 2023-04-15 DIAGNOSIS — H532 Diplopia: Secondary | ICD-10-CM | POA: Diagnosis not present

## 2023-04-16 ENCOUNTER — Other Ambulatory Visit: Payer: Self-pay

## 2023-04-16 ENCOUNTER — Ambulatory Visit (HOSPITAL_COMMUNITY): Payer: Medicare PPO | Attending: Neurology | Admitting: Physical Therapy

## 2023-04-16 DIAGNOSIS — H532 Diplopia: Secondary | ICD-10-CM | POA: Diagnosis not present

## 2023-04-16 DIAGNOSIS — M542 Cervicalgia: Secondary | ICD-10-CM | POA: Diagnosis not present

## 2023-04-16 DIAGNOSIS — M6281 Muscle weakness (generalized): Secondary | ICD-10-CM | POA: Insufficient documentation

## 2023-04-16 DIAGNOSIS — R269 Unspecified abnormalities of gait and mobility: Secondary | ICD-10-CM | POA: Insufficient documentation

## 2023-04-16 NOTE — Therapy (Signed)
OUTPATIENT PHYSICAL THERAPY CERVICAL EVALUATION   Patient Name: Mary [name] MRN: [id number] DOB:1950/07/22, 72 y.o., female Today's Date: 04/16/2023  END OF SESSION:  PT End of Session - 04/16/23 1456     Visit Number 1    Number of Visits 12    Date for PT Re-Evaluation 05/28/23    Authorization Type [employer] auth put in    Progress Note Due on Visit 10    PT Start Time 1020    PT Stop Time 1100    PT Time Calculation (min) 40 min    Activity Tolerance Patient tolerated treatment well    Behavior During Therapy WFL for tasks assessed/performed             Past Medical History:  Diagnosis Date   Anemia    Arthritis    Chronic kidney disease    idiopathic micro hematuria    Complication of anesthesia    patient states does not take much medicine to put her to sleep    Edema    ankles to knees    GERD (gastroesophageal reflux disease)    Headache(784.0)    hx of migraines    Hyperlipidemia    Mitral valve prolapse    no symptoms    Myasthenia gravis (HCC)    PONV (postoperative nausea and vomiting)    Scalp alopecia    Seizures (HCC)    1 seizure 45 years ago; unknown etiology and no meds, no seizures since then.   Past Surgical History:  Procedure Laterality Date   BIOPSY  03/24/2020   Procedure: BIOPSY;  Surgeon: Lanelle Bal, DO;  Location: [facility];  Service: Endoscopy;;  gastric duodenum   CERVICAL FUSION  2006   cervical 5-6    COLONOSCOPY N/A 03/03/2017   Procedure: COLONOSCOPY;  Surgeon: West Bali, MD;  Location: [facility];  Service: Endoscopy;  Laterality: N/A;  1:15 pm   COLONOSCOPY WITH PROPOFOL N/A 03/24/2020   Procedure: COLONOSCOPY WITH PROPOFOL;  Surgeon: Lanelle Bal, DO;  Location: [facility];  Service: Endoscopy;  Laterality: N/A;  10:00am   COLONOSCOPY WITH PROPOFOL N/A 10/02/2020   Procedure: COLONOSCOPY WITH PROPOFOL;  Surgeon: Lanelle Bal, DO;  [facility]: [facility];  Service: Endoscopy;  Laterality:  N/A;  ASA II / 9:00   ESOPHAGOGASTRODUODENOSCOPY (EGD) WITH PROPOFOL N/A 03/24/2020   Procedure: ESOPHAGOGASTRODUODENOSCOPY (EGD) WITH PROPOFOL;  Surgeon: Lanelle Bal, DO;  [facility]: [facility];  Service: Endoscopy;  Laterality: N/A;   HEMORRHOID SURGERY N/A 06/23/2017   Procedure: ANOSCOPY WITH REMOVAL OF SINGLE LESION;  Surgeon: Franky Macho, MD;  Location: [facility];  Service: General;  Laterality: N/A;   MYOCARDIAL PERFUSION STUDY  07/02/2011   NO SCINTIGRAPHIC EVIDENCE OF INDUCIBLE MYOCARDIAL ISCHEMIA. POST-STRESS EF IS 87%.EXERCISE CAPACITY 9 METS. EKG SHOWS NSR AT 69. THERE IS HORIZONTAL ST DEPRESSION WITH EXERCISE IN II, III, AVF SEEN BEST AT EARLY RECOVERY. THIS IS PRESENT IN V3-V5 AS WELL AND RETURNS TO BASELINE AT THE END OF THE TEST. NORMAL STUDY.   POLYPECTOMY  03/24/2020   Procedure: POLYPECTOMY;  Surgeon: Lanelle Bal, DO;  [facility]: [facility];  Service: Endoscopy;;  colon    POLYPECTOMY  10/02/2020   Procedure: POLYPECTOMY;  Surgeon: Lanelle Bal, DO;  [facility]: [facility];  Service: Endoscopy;;   RENAL ARTERY DOPPLER  12/28/2003   CELIAC ARTERY: AT REST, 168.4 CM/S; INSPRIATION, 177.6 CM/S. ARCUATE LIGAMENT COMPRESSION SYNDROME. R & L KIDNEYS:  RIGHT KIDNEY MEASURES SOME WHAT SMALLER THAN LEFT. KIDNEY ARE ESSENTIALLY SYMMETRICAL IN SHAPE WITH NO OBVIOUS ABNORMALITY VISUALIZED. R & L RENAL ARTERIES: DEMONSTRATE NORMAL SPECTRA. NO SUGGESTION OF DIAMETER REDUCTION, DISSECTION, FIBROMUSCULAR DYSPLASIA OR ANY OTHER VASCULAR ABNOR   TOTAL KNEE ARTHROPLASTY Right 08/31/2013   Procedure: RIGHT TOTAL KNEE ARTHROPLASTY;  Surgeon: Shelda Pal, MD;  Location: [facility];  Service: Orthopedics;  Laterality: Right;   TOTAL KNEE ARTHROPLASTY Left 03/07/2015   Procedure: TOTAL KNEE ARTHROPLASTY;  Surgeon: Durene Romans, MD;  Location: [facility];  Service: Orthopedics;  Laterality: Left;   TRANSTHORACIC ECHOCARDIOGRAM  07/02/2011   LV SYSTOLIC FUNCTION NORMAL, EF=>55%,  INSIGNIFICANT PERICARDIAL EFFUSION, LEFT ATRIAL SIZE IS NORMAL, RV SYSTOLIC PRESSURE IS NORMAL. NO SIGN VALVULAR HEART DISEASE.   TUBAL LIGATION  1986   veins stripping      bilateral legs    Patient Active Problem List   Diagnosis Date Noted   Neck pain 03/13/2023   Gait abnormality 01/23/2023   Diplopia 01/23/2023   Gastritis 04/12/2020   Serrated polyp of colon 04/12/2020   Constipation 02/23/2020   Melena 02/23/2020   Esophageal reflux 02/23/2020   Condyloma acuminatum    Encounter for colonoscopy due to history of adenomatous colonic polyps    Overweight (BMI 25.0-29.9) 03/09/2015   S/P left TKA 03/07/2015   Palpitations 05/23/2014   Expected blood loss anemia 09/01/2013   S/P right TKA 08/31/2013   DJD (degenerative joint disease) of knee 08/31/2013   Pre-operative clearance, cardiac 08/17/2013   HTN (hypertension) 05/20/2013   Hyperlipidemia 05/20/2013   Migraine 02/23/2007   ALLERGIC RHINITIS 02/23/2007   TRACHEOBRONCHITIS 02/23/2007   COUGH 02/23/2007    PCP: Elfredia Nevins  REFERRING PROVIDER: Levert Feinstein, MD  REFERRING DIAG:  Diagnosis  H53.2 (ICD-10-CM) - Diplopia  R26.9 (ICD-10-CM) - Gait abnormality  M54.2 (ICD-10-CM) - Neck pain  Pt want [place] to concentrate on her cervical area   THERAPY DIAG:  Cervicalgia Decreased strength   Rationale for Evaluation and Treatment: Rehabilitation  ONSET DATE: chronic but progressive  SUBJECTIVE:                                                                                                                                                                                                         SUBJECTIVE STATEMENT: Pt states that until September she would walk a trail that took her about about an hour she use to be able to do this in about 40 minutes but she just would give out.  By the end of the trail she would have  to be physically holding her head up.  She has changed to a flat level walking now but she would  prefer to get back to the trails. She is having more difficulty sweeping or racking leaves because her neck is down.     PERTINENT HISTORY:  Cervical fusion 2006  , myasthenia Gravis   PAIN:  Are you having pain? Yes: NPRS scale: 1/10; worst is a 7  Pain location: cervical spine and Lt scapula  Pain description: achy Aggravating factors: over the counter meds Relieving factors: tylenol  PRECAUTIONS: None     WEIGHT BEARING RESTRICTIONS: No  FALLS:  Has patient fallen in last 6 months? Yes. Number of falls 2x once because it was dark, second ankle twisted on an uneven surface.  States that she catches herself a lot.    LIVING ENVIRONMENT: Lives with: lives alone Lives in: House/apartment Stairs: Yes: Internal: 12 steps; on right going up and External: 2 steps; none Has following equipment at home: None  OCCUPATION: retired  PLOF: Independent  PATIENT GOALS: less pain, to increase her activity level  NEXT MD VISIT: after therapy  OBJECTIVE:  Note: Objective measures were completed at Evaluation unless otherwise noted.  DIAGNOSTIC FINDINGS:  IMPRESSION: 1. Anterior cervical fusion at C5-6 without foraminal or central canal stenosis. Osseous fusion of the posterior elements. 2. At C3-4 there is a mild disc bulge. Moderate bilateral facet arthropathy. Moderate-severe bilateral foraminal stenosis. 3. At C4-5 there is severe right facet arthropathy. Mild left facet arthropathy. Severe right foraminal stenosis. Mild left foraminal stenosis. Mild central canal stenosis. 4. At C6-7 there is right uncovertebral degenerative changes. No disc protrusion. Moderate-severe right foraminal stenosis. Moderate left foraminal stenosis. 5. At C7-T1 there is a mild broad disc bulge. Moderate bilateral facet arthropathy. Mild bilateral foraminal stenosis.     Electronically Signed   By: Elige Ko M.D.   On: 03/02/2023 07:44   COGNITION: Overall cognitive status: Within  functional limits for tasks assessed  SENSATION: Not tested  POSTURE: No Significant postural limitations  PALPATION: Severe mm tightness and spasm B    CERVICAL ROM:   Active ROM A/PROM (deg) eval  Flexion 45  Extension 34  Right lateral flexion 28  Left lateral flexion 24  Right rotation 27  Left rotation 27   (Blank rows = not tested)   Cervical strength: isometric:  all motions generally 3/5  UPPER EXTREMITY ROM: all wnl   TODAY'S TREATMENT:                                                                                                                              DATE: 04/16/23  Sitting: cervical ROM all x 5 Cervical retraction x 10 Scapular retraction x 10   PATIENT EDUCATION:  Education details: HEP/ the importance of sitting tall  Person educated: Patient Education method: Explanation, Verbal cues, and Handouts Education comprehension: returned demonstration  HOME EXERCISE PROGRAM: Access Code: [email or web address] URL: [email or web address]  Date: 04/16/2023 Prepared by: Virgina Organ  Exercises - Seated Cervical Extension AROM  - 2 x daily - 7 x weekly - 1 sets - 5-10 reps - 3-5 hold - Seated Cervical Sidebending AROM  - 2 x daily - 7 x weekly - 1 sets - 5-10 reps - 3-5 hold - Seated Neck Sidebending ROM  - 2 x daily - 7 x weekly - 1 sets - 5-10 reps - 3-5 hold - Seated Cervical Retraction Protraction AROM  - 2 x daily - 7 x weekly - 1 sets - 5-10 reps - 3-5 hold - Seated Scapular Retraction  - 2 x daily - 7 x weekly - 1 sets - 5-10 reps - 3-5 hold  ASSESSMENT:  CLINICAL IMPRESSION: Patient is a 72 y.o. female  who was seen today for physical therapy evaluation and treatment for cervical pain and gait abnormality at this time we are going to focus on her neck as she states that this is her primary concern.  Evaluation demonstrates decreased ROM, decreased strength, increased mm spasm, decreased activity tolerance and increased pain.  Mary [name]  will benefit from skilled PT to address these issues and maximize her functional ability.   OBJECTIVE IMPAIRMENTS: decreased activity tolerance, decreased ROM, decreased strength, increased muscle spasms, impaired flexibility, and pain.   ACTIVITY LIMITATIONS: carrying, lifting, and locomotion level  PARTICIPATION LIMITATIONS: cleaning, laundry, shopping, and community activity  PERSONAL FACTORS: Behavior pattern, Time since onset of injury/illness/exacerbation, and 1-2 comorbidities: myasthenia Gravis,migraines  are also affecting patient's functional outcome.   REHAB POTENTIAL: Good  CLINICAL DECISION MAKING: Evolving/moderate complexity  EVALUATION COMPLEXITY: Moderate   GOALS: Goals reviewed with patient? No  SHORT TERM GOALS: Target date: 05/06/23  Pt to be I in HEP in order to decrease her pain to no greater than a 4/10 Baseline:  Goal status: INITIAL  2.  PT mm strength to be increased 1/2 grade to be able to complete 15 minutes to housework without difficulty  Baseline:  Goal status: INITIAL  3.  PT to be able to rotate her head 10 degrees greater each direction for safer driving  Baseline:  Goal status: INITIAL  4.  MM spasms to be min to mod  Baseline:  Goal status: INITIAL  LONG TERM GOALS: Target date: 05/27/22  Pt to be I in HEP in order to decrease her pain to no greater than a 2/10 Baseline:  Goal status: INITIAL  2.  PT mm strength to be increased 1 grade to be able to complete 30 minutes to housework without difficulty Baseline:  Goal status: INITIAL  3.  PT to be able to rotate her head 20 degrees greater each direction for safer driving Baseline:  Goal status: INITIAL  4.  MM spasms to be min  Baseline:  Goal status: INITIAL   PLAN:  PT FREQUENCY: 2x/week  PT DURATION: 4 weeks  PLANNED INTERVENTIONS: 97110-Therapeutic exercises, 97530- Therapeutic activity, 97112- Neuromuscular re-education, 97535- Self Care, and 47829- Manual  therapy  PLAN FOR NEXT SESSION: continue manual to decrease spasms and improve pain, progress ROM and strength of cervical   Virgina Organ, PT CLT [phone]  04/16/2023, 2:58 PM [employer] Auth Request  Referring diagnosis code (ICD 10)? M54.2 Treatment diagnosis codes (ICD 10)? (if different than referring diagnosis) M54.2; M62.81 What was this (referring dx) caused by? [x]  Surgery []  Fall []  Ongoing issue [x]  Arthritis []  Other: ____________  Laterality: []  Rt []  Lt [x]  Both  Deficits: [x]  Pain [x]  Stiffness [x]  Weakness []   Edema []  Balance Deficits []  Coordination []  Gait Disturbance [x]  ROM []  Other   Functional Tool Score: unable to turn head to see blind side; unable to complete more than 5 minutes housework due to cervical pain.  CPT codes: See Planned Interventions listed in the Plan section of the Evaluation.

## 2023-04-22 ENCOUNTER — Ambulatory Visit (HOSPITAL_COMMUNITY): Payer: Medicare PPO | Admitting: Physical Therapy

## 2023-04-22 DIAGNOSIS — M6281 Muscle weakness (generalized): Secondary | ICD-10-CM

## 2023-04-22 DIAGNOSIS — M542 Cervicalgia: Secondary | ICD-10-CM

## 2023-04-22 DIAGNOSIS — R269 Unspecified abnormalities of gait and mobility: Secondary | ICD-10-CM | POA: Diagnosis not present

## 2023-04-22 DIAGNOSIS — H532 Diplopia: Secondary | ICD-10-CM | POA: Diagnosis not present

## 2023-04-22 NOTE — Therapy (Signed)
OUTPATIENT PHYSICAL THERAPY CERVICAL TREATMENT   Patient Name: Mary [name] MRN: [id number] DOB:1951-03-02, 72 y.o., female Today's Date: 04/22/2023  END OF SESSION:  PT End of Session - 04/22/23 1157     Visit Number 2    Number of Visits 12    Date for PT Re-Evaluation 05/28/23    Authorization Type humana    Authorization Time Period 12 visits 04/16/23-05/27/22    Authorization - Visit Number 2    Authorization - Number of Visits 12    Progress Note Due on Visit 10    PT Start Time 1152    PT Stop Time 1230    PT Time Calculation (min) 38 min    Activity Tolerance Patient tolerated treatment well    Behavior During Therapy WFL for tasks assessed/performed              Past Medical History:  Diagnosis Date   Anemia    Arthritis    Chronic kidney disease    idiopathic micro hematuria    Complication of anesthesia    patient states does not take much medicine to put her to sleep    Edema    ankles to knees    GERD (gastroesophageal reflux disease)    Headache(784.0)    hx of migraines    Hyperlipidemia    Mitral valve prolapse    no symptoms    Myasthenia gravis (HCC)    PONV (postoperative nausea and vomiting)    Scalp alopecia    Seizures (HCC)    1 seizure 45 years ago; unknown etiology and no meds, no seizures since then.   Past Surgical History:  Procedure Laterality Date   BIOPSY  03/24/2020   Procedure: BIOPSY;  Surgeon: Lanelle Bal, DO;  Location: [facility];  Service: Endoscopy;;  gastric duodenum   CERVICAL FUSION  2006   cervical 5-6    COLONOSCOPY N/A 03/03/2017   Procedure: COLONOSCOPY;  Surgeon: West Bali, MD;  Location: [facility];  Service: Endoscopy;  Laterality: N/A;  1:15 pm   COLONOSCOPY WITH PROPOFOL N/A 03/24/2020   Procedure: COLONOSCOPY WITH PROPOFOL;  Surgeon: Lanelle Bal, DO;  [facility]: [facility];  Service: Endoscopy;  Laterality: N/A;  10:00am   COLONOSCOPY WITH PROPOFOL N/A 10/02/2020    Procedure: COLONOSCOPY WITH PROPOFOL;  Surgeon: Lanelle Bal, DO;  [facility]: [facility];  Service: Endoscopy;  Laterality: N/A;  ASA II / 9:00   ESOPHAGOGASTRODUODENOSCOPY (EGD) WITH PROPOFOL N/A 03/24/2020   Procedure: ESOPHAGOGASTRODUODENOSCOPY (EGD) WITH PROPOFOL;  Surgeon: Lanelle Bal, DO;  [facility]: [facility];  Service: Endoscopy;  Laterality: N/A;   HEMORRHOID SURGERY N/A 06/23/2017   Procedure: ANOSCOPY WITH REMOVAL OF SINGLE LESION;  Surgeon: Franky Macho, MD;  Location: [facility];  Service: General;  Laterality: N/A;   MYOCARDIAL PERFUSION STUDY  07/02/2011   NO SCINTIGRAPHIC EVIDENCE OF INDUCIBLE MYOCARDIAL ISCHEMIA. POST-STRESS EF IS 87%.EXERCISE CAPACITY 9 METS. EKG SHOWS NSR AT 69. THERE IS HORIZONTAL ST DEPRESSION WITH EXERCISE IN II, III, AVF SEEN BEST AT EARLY RECOVERY. THIS IS PRESENT IN V3-V5 AS WELL AND RETURNS TO BASELINE AT THE END OF THE TEST. NORMAL STUDY.   POLYPECTOMY  03/24/2020   Procedure: POLYPECTOMY;  Surgeon: Lanelle Bal, DO;  [facility]: [facility];  Service: Endoscopy;;  colon    POLYPECTOMY  10/02/2020   Procedure: POLYPECTOMY;  Surgeon: Lanelle Bal, DO;  [facility]: [facility];  Service: Endoscopy;;  RENAL ARTERY DOPPLER  12/28/2003   CELIAC ARTERY: AT REST, 168.4 CM/S; INSPRIATION, 177.6 CM/S. ARCUATE LIGAMENT COMPRESSION SYNDROME. R & L KIDNEYS: RIGHT KIDNEY MEASURES SOME WHAT SMALLER THAN LEFT. KIDNEY ARE ESSENTIALLY SYMMETRICAL IN SHAPE WITH NO OBVIOUS ABNORMALITY VISUALIZED. R & L RENAL ARTERIES: DEMONSTRATE NORMAL SPECTRA. NO SUGGESTION OF DIAMETER REDUCTION, DISSECTION, FIBROMUSCULAR DYSPLASIA OR ANY OTHER VASCULAR ABNOR   TOTAL KNEE ARTHROPLASTY Right 08/31/2013   Procedure: RIGHT TOTAL KNEE ARTHROPLASTY;  Surgeon: Shelda Pal, MD;  Location: [facility];  Service: Orthopedics;  Laterality: Right;   TOTAL KNEE ARTHROPLASTY Left 03/07/2015   Procedure: TOTAL KNEE ARTHROPLASTY;  Surgeon: Durene Romans, MD;  Location: [facility];   Service: Orthopedics;  Laterality: Left;   TRANSTHORACIC ECHOCARDIOGRAM  07/02/2011   LV SYSTOLIC FUNCTION NORMAL, EF=>55%, INSIGNIFICANT PERICARDIAL EFFUSION, LEFT ATRIAL SIZE IS NORMAL, RV SYSTOLIC PRESSURE IS NORMAL. NO SIGN VALVULAR HEART DISEASE.   TUBAL LIGATION  1986   veins stripping      bilateral legs    Patient Active Problem List   Diagnosis Date Noted   Neck pain 03/13/2023   Gait abnormality 01/23/2023   Diplopia 01/23/2023   Gastritis 04/12/2020   Serrated polyp of colon 04/12/2020   Constipation 02/23/2020   Melena 02/23/2020   Esophageal reflux 02/23/2020   Condyloma acuminatum    Encounter for colonoscopy due to history of adenomatous colonic polyps    Overweight (BMI 25.0-29.9) 03/09/2015   S/P left TKA 03/07/2015   Palpitations 05/23/2014   Expected blood loss anemia 09/01/2013   S/P right TKA 08/31/2013   DJD (degenerative joint disease) of knee 08/31/2013   Pre-operative clearance, cardiac 08/17/2013   HTN (hypertension) 05/20/2013   Hyperlipidemia 05/20/2013   Migraine 02/23/2007   ALLERGIC RHINITIS 02/23/2007   TRACHEOBRONCHITIS 02/23/2007   COUGH 02/23/2007    PCP: Elfredia Nevins  REFERRING PROVIDER: Levert Feinstein, MD  REFERRING DIAG:  Diagnosis  H53.2 (ICD-10-CM) - Diplopia  R26.9 (ICD-10-CM) - Gait abnormality  M54.2 (ICD-10-CM) - Neck pain  Pt want [place] to concentrate on her cervical area   THERAPY DIAG:  Cervicalgia Decreased strength   Rationale for Evaluation and Treatment: Rehabilitation  ONSET DATE: chronic but progressive  SUBJECTIVE:                                                                                                                                                                                                         SUBJECTIVE STATEMENT: 04/22/23:    Evaluation: Pt states that until September she would walk a trail that took her about about an  hour she use to be able to do this in about 40 minutes but she just would  give out.  By the end of the trail she would have to be physically holding her head up.  She has changed to a flat level walking now but she would prefer to get back to the trails. She is having more difficulty sweeping or racking leaves because her neck is down.     PERTINENT HISTORY:  Cervical fusion 2006  , myasthenia Gravis   PAIN:  Are you having pain? Yes: NPRS scale: 1/10; worst is a 7  Pain location: cervical spine and Lt scapula  Pain description: achy Aggravating factors: over the counter meds Relieving factors: tylenol  PRECAUTIONS: None     WEIGHT BEARING RESTRICTIONS: No  FALLS:  Has patient fallen in last 6 months? Yes. Number of falls 2x once because it was dark, second ankle twisted on an uneven surface.  States that she catches herself a lot.    LIVING ENVIRONMENT: Lives with: lives alone Lives in: House/apartment Stairs: Yes: Internal: 12 steps; on right going up and External: 2 steps; none Has following equipment at home: None  OCCUPATION: retired  PLOF: Independent  PATIENT GOALS: less pain, to increase her activity level  NEXT MD VISIT: after therapy  OBJECTIVE:  Note: Objective measures were completed at Evaluation unless otherwise noted.  DIAGNOSTIC FINDINGS:  IMPRESSION: 1. Anterior cervical fusion at C5-6 without foraminal or central canal stenosis. Osseous fusion of the posterior elements. 2. At C3-4 there is a mild disc bulge. Moderate bilateral facet arthropathy. Moderate-severe bilateral foraminal stenosis. 3. At C4-5 there is severe right facet arthropathy. Mild left facet arthropathy. Severe right foraminal stenosis. Mild left foraminal stenosis. Mild central canal stenosis. 4. At C6-7 there is right uncovertebral degenerative changes. No disc protrusion. Moderate-severe right foraminal stenosis. Moderate left foraminal stenosis. 5. At C7-T1 there is a mild broad disc bulge. Moderate bilateral facet arthropathy. Mild bilateral  foraminal stenosis.     Electronically Signed   By: Elige Ko M.D.   On: 03/02/2023 07:44   COGNITION: Overall cognitive status: Within functional limits for tasks assessed  SENSATION: Not tested  POSTURE: No Significant postural limitations  PALPATION: Severe mm tightness and spasm B    CERVICAL ROM:   Active ROM A/PROM (deg) eval  Flexion 45  Extension 34  Right lateral flexion 28  Left lateral flexion 24  Right rotation 27  Left rotation 27   (Blank rows = not tested)   Cervical strength: isometric:  all motions generally 3/5   UPPER EXTREMITY ROM: all wnl   TODAY'S TREATMENT:                                                                                                                              DATE:  04/22/23 Goal and HEP reviewe Seated:  cervical ROM all directions 5X each  Cervical retractions 10X5" each  Scapular retractions 10X5"  eacg Doorway chest stretch 3X30" Manual seated bil UT, scaps and scalenes  04/16/23  Sitting: cervical ROM all x 5 Cervical retraction x 10 Scapular retraction x 10   PATIENT EDUCATION:  Education details: HEP/ the importance of sitting tall  Person educated: Patient Education method: Explanation, Verbal cues, and Handouts Education comprehension: returned demonstration  HOME EXERCISE PROGRAM: Access Code: [email or web address] URL: [email or web address] Date: 04/16/2023 Prepared by: Virgina Organ  Exercises - Seated Cervical Extension AROM  - 2 x daily - 7 x weekly - 1 sets - 5-10 reps - 3-5 hold - Seated Cervical Sidebending AROM  - 2 x daily - 7 x weekly - 1 sets - 5-10 reps - 3-5 hold - Seated Neck Sidebending ROM  - 2 x daily - 7 x weekly - 1 sets - 5-10 reps - 3-5 hold - Seated Cervical Retraction Protraction AROM  - 2 x daily - 7 x weekly - 1 sets - 5-10 reps - 3-5 hold - Seated Scapular Retraction  - 2 x daily - 7 x weekly - 1 sets - 5-10 reps - 3-5 hold  04/22/23: doorway stretch 3X30"  holds  ASSESSMENT:  CLINICAL IMPRESSION: Reviewed goals and POC moving forward.  Pt able to complete all exercises with minimal cues.  Encouraged to increase hold times in end range.  Added doorway stretch for tight pectoral mm and continued with manual for bil upper trap/scapular mm. Updated HEP to include this stretch.  Able to resolve most of upper trap spasms with manual with pt reporting overall relief following.  Pt will benefit from skilled PT to address these issues and maximize her functional ability.   OBJECTIVE IMPAIRMENTS: decreased activity tolerance, decreased ROM, decreased strength, increased muscle spasms, impaired flexibility, and pain.   ACTIVITY LIMITATIONS: carrying, lifting, and locomotion level  PARTICIPATION LIMITATIONS: cleaning, laundry, shopping, and community activity  PERSONAL FACTORS: Behavior pattern, Time since onset of injury/illness/exacerbation, and 1-2 comorbidities: myasthenia Gravis,migraines  are also affecting patient's functional outcome.   REHAB POTENTIAL: Good  CLINICAL DECISION MAKING: Evolving/moderate complexity  EVALUATION COMPLEXITY: Moderate   GOALS: Goals reviewed with patient? No  SHORT TERM GOALS: Target date: 05/06/23  Pt to be I in HEP in order to decrease her pain to no greater than a 4/10 Baseline:  Goal status: IN PROGRESS  2.  PT mm strength to be increased 1/2 grade to be able to complete 15 minutes to housework without difficulty  Baseline:  Goal status: IN PROGRESS  3.  PT to be able to rotate her head 10 degrees greater each direction for safer driving  Baseline:  Goal status: IN PROGRESS  4.  MM spasms to be min to mod  Baseline:  Goal status: IN PROGRESS  LONG TERM GOALS: Target date: 05/27/22  Pt to be I in HEP in order to decrease her pain to no greater than a 2/10 Baseline:  Goal status: IN PROGRESS  2.  PT mm strength to be increased 1 grade to be able to complete 30 minutes to housework without  difficulty Baseline:  Goal status: IN PROGRESS  3.  PT to be able to rotate her head 20 degrees greater each direction for safer driving Baseline:  Goal status: IN PROGRESS  4.  MM spasms to be min  Baseline:  Goal status: IN PROGRESS   PLAN:  PT FREQUENCY: 2x/week  PT DURATION: 4 weeks  PLANNED INTERVENTIONS: 97110-Therapeutic exercises, 97530- Therapeutic activity, [id number]- Neuromuscular re-education, 97535- Self Care, and [id number]- Manual therapy  PLAN  FOR NEXT SESSION: continue manual to decrease spasms and improve pain, progress ROM and strength of cervical   Lurena Nida, PTA/CLT [facility] Ph: [phone]   04/22/2023, 11:58 AM

## 2023-04-24 ENCOUNTER — Ambulatory Visit (HOSPITAL_COMMUNITY): Payer: Medicare PPO

## 2023-04-24 ENCOUNTER — Encounter: Payer: Self-pay | Admitting: Neurology

## 2023-04-24 ENCOUNTER — Encounter (HOSPITAL_COMMUNITY): Payer: Self-pay

## 2023-04-24 DIAGNOSIS — M6281 Muscle weakness (generalized): Secondary | ICD-10-CM | POA: Diagnosis not present

## 2023-04-24 DIAGNOSIS — M542 Cervicalgia: Secondary | ICD-10-CM

## 2023-04-24 DIAGNOSIS — H532 Diplopia: Secondary | ICD-10-CM | POA: Diagnosis not present

## 2023-04-24 DIAGNOSIS — R269 Unspecified abnormalities of gait and mobility: Secondary | ICD-10-CM | POA: Diagnosis not present

## 2023-04-24 NOTE — Therapy (Signed)
OUTPATIENT PHYSICAL THERAPY CERVICAL TREATMENT   Patient Name: Mary [name] MRN: [id number] DOB:October 17, 1950, 72 y.o., female Today's Date: 04/24/2023  END OF SESSION:  PT End of Session - 04/24/23 0848     Visit Number 3    Number of Visits 12    Date for PT Re-Evaluation 05/28/23    Authorization Type humana    Authorization Time Period 12 visits 04/16/23-05/27/22    Authorization - Visit Number 3    Authorization - Number of Visits 12    Progress Note Due on Visit 10    PT Start Time 0848    PT Stop Time 0930    PT Time Calculation (min) 42 min    Activity Tolerance Patient tolerated treatment well    Behavior During Therapy WFL for tasks assessed/performed              Past Medical History:  Diagnosis Date   Anemia    Arthritis    Chronic kidney disease    idiopathic micro hematuria    Complication of anesthesia    patient states does not take much medicine to put her to sleep    Edema    ankles to knees    GERD (gastroesophageal reflux disease)    Headache(784.0)    hx of migraines    Hyperlipidemia    Mitral valve prolapse    no symptoms    Myasthenia gravis (HCC)    PONV (postoperative nausea and vomiting)    Scalp alopecia    Seizures (HCC)    1 seizure 45 years ago; unknown etiology and no meds, no seizures since then.   Past Surgical History:  Procedure Laterality Date   BIOPSY  03/24/2020   Procedure: BIOPSY;  Surgeon: Lanelle Bal, DO;  Location: [facility];  Service: Endoscopy;;  gastric duodenum   CERVICAL FUSION  2006   cervical 5-6    COLONOSCOPY N/A 03/03/2017   Procedure: COLONOSCOPY;  Surgeon: West Bali, MD;  Location: [facility];  Service: Endoscopy;  Laterality: N/A;  1:15 pm   COLONOSCOPY WITH PROPOFOL N/A 03/24/2020   Procedure: COLONOSCOPY WITH PROPOFOL;  Surgeon: Lanelle Bal, DO;  Location: [facility];  Service: Endoscopy;  Laterality: N/A;  10:00am   COLONOSCOPY WITH PROPOFOL N/A 10/02/2020    Procedure: COLONOSCOPY WITH PROPOFOL;  Surgeon: Lanelle Bal, DO;  Location: [facility];  Service: Endoscopy;  Laterality: N/A;  ASA II / 9:00   ESOPHAGOGASTRODUODENOSCOPY (EGD) WITH PROPOFOL N/A 03/24/2020   Procedure: ESOPHAGOGASTRODUODENOSCOPY (EGD) WITH PROPOFOL;  Surgeon: Lanelle Bal, DO;  [facility]: [facility];  Service: Endoscopy;  Laterality: N/A;   HEMORRHOID SURGERY N/A 06/23/2017   Procedure: ANOSCOPY WITH REMOVAL OF SINGLE LESION;  Surgeon: Franky Macho, MD;  Location: [facility];  Service: General;  Laterality: N/A;   MYOCARDIAL PERFUSION STUDY  07/02/2011   NO SCINTIGRAPHIC EVIDENCE OF INDUCIBLE MYOCARDIAL ISCHEMIA. POST-STRESS EF IS 87%.EXERCISE CAPACITY 9 METS. EKG SHOWS NSR AT 69. THERE IS HORIZONTAL ST DEPRESSION WITH EXERCISE IN II, III, AVF SEEN BEST AT EARLY RECOVERY. THIS IS PRESENT IN V3-V5 AS WELL AND RETURNS TO BASELINE AT THE END OF THE TEST. NORMAL STUDY.   POLYPECTOMY  03/24/2020   Procedure: POLYPECTOMY;  Surgeon: Lanelle Bal, DO;  [facility]: [facility];  Service: Endoscopy;;  colon    POLYPECTOMY  10/02/2020   Procedure: POLYPECTOMY;  Surgeon: Lanelle Bal, DO;  Location: [facility];  Service: Endoscopy;;  RENAL ARTERY DOPPLER  12/28/2003   CELIAC ARTERY: AT REST, 168.4 CM/S; INSPRIATION, 177.6 CM/S. ARCUATE LIGAMENT COMPRESSION SYNDROME. R & L KIDNEYS: RIGHT KIDNEY MEASURES SOME WHAT SMALLER THAN LEFT. KIDNEY ARE ESSENTIALLY SYMMETRICAL IN SHAPE WITH NO OBVIOUS ABNORMALITY VISUALIZED. R & L RENAL ARTERIES: DEMONSTRATE NORMAL SPECTRA. NO SUGGESTION OF DIAMETER REDUCTION, DISSECTION, FIBROMUSCULAR DYSPLASIA OR ANY OTHER VASCULAR ABNOR   TOTAL KNEE ARTHROPLASTY Right 08/31/2013   Procedure: RIGHT TOTAL KNEE ARTHROPLASTY;  Surgeon: Shelda Pal, MD;  Location: [facility];  Service: Orthopedics;  Laterality: Right;   TOTAL KNEE ARTHROPLASTY Left 03/07/2015   Procedure: TOTAL KNEE ARTHROPLASTY;  Surgeon: Durene Romans, MD;  Location: [facility];   Service: Orthopedics;  Laterality: Left;   TRANSTHORACIC ECHOCARDIOGRAM  07/02/2011   LV SYSTOLIC FUNCTION NORMAL, EF=>55%, INSIGNIFICANT PERICARDIAL EFFUSION, LEFT ATRIAL SIZE IS NORMAL, RV SYSTOLIC PRESSURE IS NORMAL. NO SIGN VALVULAR HEART DISEASE.   TUBAL LIGATION  1986   veins stripping      bilateral legs    Patient Active Problem List   Diagnosis Date Noted   Neck pain 03/13/2023   Gait abnormality 01/23/2023   Diplopia 01/23/2023   Gastritis 04/12/2020   Serrated polyp of colon 04/12/2020   Constipation 02/23/2020   Melena 02/23/2020   Esophageal reflux 02/23/2020   Condyloma acuminatum    Encounter for colonoscopy due to history of adenomatous colonic polyps    Overweight (BMI 25.0-29.9) 03/09/2015   S/P left TKA 03/07/2015   Palpitations 05/23/2014   Expected blood loss anemia 09/01/2013   S/P right TKA 08/31/2013   DJD (degenerative joint disease) of knee 08/31/2013   Pre-operative clearance, cardiac 08/17/2013   HTN (hypertension) 05/20/2013   Hyperlipidemia 05/20/2013   Migraine 02/23/2007   ALLERGIC RHINITIS 02/23/2007   TRACHEOBRONCHITIS 02/23/2007   COUGH 02/23/2007    PCP: Elfredia Nevins  REFERRING PROVIDER: Levert Feinstein, MD  REFERRING DIAG:  Diagnosis  H53.2 (ICD-10-CM) - Diplopia  R26.9 (ICD-10-CM) - Gait abnormality  M54.2 (ICD-10-CM) - Neck pain  Pt want [place] to concentrate on her cervical area   THERAPY DIAG:  Cervicalgia Decreased strength   Rationale for Evaluation and Treatment: Rehabilitation  ONSET DATE: chronic but progressive  SUBJECTIVE:                                                                                                                                                                                                         SUBJECTIVE STATEMENT: 04/24/23:  Pt reports the manual has been helping with her neck stiffness.  Pain scale 1/10 neck pain constant soreness  achey posterior neck.     Evaluation: Pt states that until  September she would walk a trail that took her about about an hour she use to be able to do this in about 40 minutes but she just would give out.  By the end of the trail she would have to be physically holding her head up.  She has changed to a flat level walking now but she would prefer to get back to the trails. She is having more difficulty sweeping or racking leaves because her neck is down.     PERTINENT HISTORY:  Cervical fusion 2006  , myasthenia Gravis   PAIN:  Are you having pain? Yes: NPRS scale: 1/10; worst is a 7  Pain location: cervical spine and Lt scapula  Pain description: achy Aggravating factors: over the counter meds Relieving factors: tylenol  PRECAUTIONS: None     WEIGHT BEARING RESTRICTIONS: No  FALLS:  Has patient fallen in last 6 months? Yes. Number of falls 2x once because it was dark, second ankle twisted on an uneven surface.  States that she catches herself a lot.    LIVING ENVIRONMENT: Lives with: lives alone Lives in: House/apartment Stairs: Yes: Internal: 12 steps; on right going up and External: 2 steps; none Has following equipment at home: None  OCCUPATION: retired  PLOF: Independent  PATIENT GOALS: less pain, to increase her activity level  NEXT MD VISIT: after therapy  OBJECTIVE:  Note: Objective measures were completed at Evaluation unless otherwise noted.  DIAGNOSTIC FINDINGS:  IMPRESSION: 1. Anterior cervical fusion at C5-6 without foraminal or central canal stenosis. Osseous fusion of the posterior elements. 2. At C3-4 there is a mild disc bulge. Moderate bilateral facet arthropathy. Moderate-severe bilateral foraminal stenosis. 3. At C4-5 there is severe right facet arthropathy. Mild left facet arthropathy. Severe right foraminal stenosis. Mild left foraminal stenosis. Mild central canal stenosis. 4. At C6-7 there is right uncovertebral degenerative changes. No disc protrusion. Moderate-severe right foraminal stenosis.  Moderate left foraminal stenosis. 5. At C7-T1 there is a mild broad disc bulge. Moderate bilateral facet arthropathy. Mild bilateral foraminal stenosis.     Electronically Signed   By: Elige Ko M.D.   On: 03/02/2023 07:44   COGNITION: Overall cognitive status: Within functional limits for tasks assessed  SENSATION: Not tested  POSTURE: No Significant postural limitations  PALPATION: Severe mm tightness and spasm B    CERVICAL ROM:   Active ROM A/PROM (deg) eval  Flexion 45  Extension 34  Right lateral flexion 28  Left lateral flexion 24  Right rotation 27  Left rotation 27   (Blank rows = not tested)   Cervical strength: isometric:  all motions generally 3/5   UPPER EXTREMITY ROM: all wnl   TODAY'S TREATMENT:  DATE:  04/24/23: Seated:  3D Thoracic excursion all directions 5x  Isometric 5x 5" sidebend, rotation, extension Cervical retraction 10x 5"  UT stretch 2x 30"  Reviewed form with single arm doorway stretch   Manual supine Bil UT, scalenes, PROM, suboccipital release x2  04/22/23 Goal and HEP reviewe Seated:  cervical ROM all directions 5X each  Cervical retractions 10X5" each  Scapular retractions 10X5" eacg Doorway chest stretch 3X30" Manual seated bil UT, scaps and scalenes  04/16/23  Sitting: cervical ROM all x 5 Cervical retraction x 10 Scapular retraction x 10   PATIENT EDUCATION:  Education details: HEP/ the importance of sitting tall  Person educated: Patient Education method: Explanation, Verbal cues, and Handouts Education comprehension: returned demonstration  HOME EXERCISE PROGRAM: Access Code: [email or web address] URL: [email or web address] Date: 04/16/2023 Prepared by: Virgina Organ  Exercises - Seated Cervical Extension AROM  - 2 x daily - 7 x weekly - 1 sets - 5-10 reps - 3-5 hold -  Seated Cervical Sidebending AROM  - 2 x daily - 7 x weekly - 1 sets - 5-10 reps - 3-5 hold - Seated Neck Sidebending ROM  - 2 x daily - 7 x weekly - 1 sets - 5-10 reps - 3-5 hold - Seated Cervical Retraction Protraction AROM  - 2 x daily - 7 x weekly - 1 sets - 5-10 reps - 3-5 hold - Seated Scapular Retraction  - 2 x daily - 7 x weekly - 1 sets - 5-10 reps - 3-5 hold  04/22/23: doorway stretch 3X30" holds  04/24/23: - Single Arm Doorway Pec Stretch at 90 Degrees Abduction  - 2 x daily - 7 x weekly - 3 sets - 3 reps - 30" hold - Seated Isometric Cervical Rotation  - 2 x daily - 4-7 x weekly - 2 sets - 5 reps - 5" hold - Seated Isometric Cervical Sidebending  - 1 x daily - 7 x weekly - 3 sets - 10 reps - 5" hold - Seated Upper Trapezius Stretch  - 2 x daily - 7 x weekly - 1 sets - 3 reps - 30" hold  ASSESSMENT:  CLINICAL IMPRESSION: Session focus with cervical mobilty and strengthening.  Added isometric for cervical mm strengthening and thoracic excursion and UT stretch for mobility that was tolerated well.  EOS with manual STM and PROM to improve cervical mobility with reports of relief following.    OBJECTIVE IMPAIRMENTS: decreased activity tolerance, decreased ROM, decreased strength, increased muscle spasms, impaired flexibility, and pain.   ACTIVITY LIMITATIONS: carrying, lifting, and locomotion level  PARTICIPATION LIMITATIONS: cleaning, laundry, shopping, and community activity  PERSONAL FACTORS: Behavior pattern, Time since onset of injury/illness/exacerbation, and 1-2 comorbidities: myasthenia Gravis,migraines  are also affecting patient's functional outcome.   REHAB POTENTIAL: Good  CLINICAL DECISION MAKING: Evolving/moderate complexity  EVALUATION COMPLEXITY: Moderate   GOALS: Goals reviewed with patient? No  SHORT TERM GOALS: Target date: 05/06/23  Pt to be I in HEP in order to decrease her pain to no greater than a 4/10 Baseline:  Goal status: IN PROGRESS  2.  PT  mm strength to be increased 1/2 grade to be able to complete 15 minutes to housework without difficulty  Baseline:  Goal status: IN PROGRESS  3.  PT to be able to rotate her head 10 degrees greater each direction for safer driving  Baseline:  Goal status: IN PROGRESS  4.  MM spasms to be min to mod  Baseline:  Goal status: IN PROGRESS  LONG  TERM GOALS: Target date: 05/27/22  Pt to be I in HEP in order to decrease her pain to no greater than a 2/10 Baseline:  Goal status: IN PROGRESS  2.  PT mm strength to be increased 1 grade to be able to complete 30 minutes to housework without difficulty Baseline:  Goal status: IN PROGRESS  3.  PT to be able to rotate her head 20 degrees greater each direction for safer driving Baseline:  Goal status: IN PROGRESS  4.  MM spasms to be min  Baseline:  Goal status: IN PROGRESS   PLAN:  PT FREQUENCY: 2x/week  PT DURATION: 4 weeks  PLANNED INTERVENTIONS: 97110-Therapeutic exercises, 97530- Therapeutic activity, 97112- Neuromuscular re-education, 97535- Self Care, and 16109- Manual therapy  PLAN FOR NEXT SESSION: continue manual to decrease spasms and improve pain, progress ROM and strength of cervical   Becky Sax, LPTA/CLT; CBIS [phone]  Juel Burrow, PTA 04/24/2023, 9:46 AM  04/24/2023, 9:46 AM

## 2023-04-29 ENCOUNTER — Ambulatory Visit (HOSPITAL_COMMUNITY): Payer: Medicare PPO | Admitting: Physical Therapy

## 2023-04-29 DIAGNOSIS — M6281 Muscle weakness (generalized): Secondary | ICD-10-CM

## 2023-04-29 DIAGNOSIS — M542 Cervicalgia: Secondary | ICD-10-CM | POA: Diagnosis not present

## 2023-04-29 DIAGNOSIS — R269 Unspecified abnormalities of gait and mobility: Secondary | ICD-10-CM | POA: Diagnosis not present

## 2023-04-29 DIAGNOSIS — H532 Diplopia: Secondary | ICD-10-CM | POA: Diagnosis not present

## 2023-04-29 NOTE — Therapy (Signed)
OUTPATIENT PHYSICAL THERAPY CERVICAL TREATMENT   Patient Name: Mary [name] MRN: [id number] DOB:04-17-1951, 72 y.o., female Today's Date: 04/29/2023  END OF SESSION:  PT End of Session - 04/29/23 0932     Visit Number 4    Number of Visits 12    Date for PT Re-Evaluation 05/28/23    Authorization Type humana    Authorization Time Period 12 visits 04/16/23-05/27/22    Authorization - Visit Number 4    Authorization - Number of Visits 12    Progress Note Due on Visit 10    PT Start Time 0935    PT Stop Time 1016    PT Time Calculation (min) 41 min    Activity Tolerance Patient tolerated treatment well    Behavior During Therapy WFL for tasks assessed/performed              Past Medical History:  Diagnosis Date   Anemia    Arthritis    Chronic kidney disease    idiopathic micro hematuria    Complication of anesthesia    patient states does not take much medicine to put her to sleep    Edema    ankles to knees    GERD (gastroesophageal reflux disease)    Headache(784.0)    hx of migraines    Hyperlipidemia    Mitral valve prolapse    no symptoms    Myasthenia gravis (HCC)    PONV (postoperative nausea and vomiting)    Scalp alopecia    Seizures (HCC)    1 seizure 45 years ago; unknown etiology and no meds, no seizures since then.   Past Surgical History:  Procedure Laterality Date   BIOPSY  03/24/2020   Procedure: BIOPSY;  Surgeon: Lanelle Bal, DO;  Location: [facility];  Service: Endoscopy;;  gastric duodenum   CERVICAL FUSION  2006   cervical 5-6    COLONOSCOPY N/A 03/03/2017   Procedure: COLONOSCOPY;  Surgeon: West Bali, MD;  Location: [facility];  Service: Endoscopy;  Laterality: N/A;  1:15 pm   COLONOSCOPY WITH PROPOFOL N/A 03/24/2020   Procedure: COLONOSCOPY WITH PROPOFOL;  Surgeon: Lanelle Bal, DO;  Location: [facility];  Service: Endoscopy;  Laterality: N/A;  10:00am   COLONOSCOPY WITH PROPOFOL N/A 10/02/2020    Procedure: COLONOSCOPY WITH PROPOFOL;  Surgeon: Lanelle Bal, DO;  Location: [facility];  Service: Endoscopy;  Laterality: N/A;  ASA II / 9:00   ESOPHAGOGASTRODUODENOSCOPY (EGD) WITH PROPOFOL N/A 03/24/2020   Procedure: ESOPHAGOGASTRODUODENOSCOPY (EGD) WITH PROPOFOL;  Surgeon: Lanelle Bal, DO;  [facility]: [facility];  Service: Endoscopy;  Laterality: N/A;   HEMORRHOID SURGERY N/A 06/23/2017   Procedure: ANOSCOPY WITH REMOVAL OF SINGLE LESION;  Surgeon: Franky Macho, MD;  Location: [facility];  Service: General;  Laterality: N/A;   MYOCARDIAL PERFUSION STUDY  07/02/2011   NO SCINTIGRAPHIC EVIDENCE OF INDUCIBLE MYOCARDIAL ISCHEMIA. POST-STRESS EF IS 87%.EXERCISE CAPACITY 9 METS. EKG SHOWS NSR AT 69. THERE IS HORIZONTAL ST DEPRESSION WITH EXERCISE IN II, III, AVF SEEN BEST AT EARLY RECOVERY. THIS IS PRESENT IN V3-V5 AS WELL AND RETURNS TO BASELINE AT THE END OF THE TEST. NORMAL STUDY.   POLYPECTOMY  03/24/2020   Procedure: POLYPECTOMY;  Surgeon: Lanelle Bal, DO;  [facility]: [facility];  Service: Endoscopy;;  colon    POLYPECTOMY  10/02/2020   Procedure: POLYPECTOMY;  Surgeon: Lanelle Bal, DO;  Location: [facility];  Service: Endoscopy;;  RENAL ARTERY DOPPLER  12/28/2003   CELIAC ARTERY: AT REST, 168.4 CM/S; INSPRIATION, 177.6 CM/S. ARCUATE LIGAMENT COMPRESSION SYNDROME. R & L KIDNEYS: RIGHT KIDNEY MEASURES SOME WHAT SMALLER THAN LEFT. KIDNEY ARE ESSENTIALLY SYMMETRICAL IN SHAPE WITH NO OBVIOUS ABNORMALITY VISUALIZED. R & L RENAL ARTERIES: DEMONSTRATE NORMAL SPECTRA. NO SUGGESTION OF DIAMETER REDUCTION, DISSECTION, FIBROMUSCULAR DYSPLASIA OR ANY OTHER VASCULAR ABNOR   TOTAL KNEE ARTHROPLASTY Right 08/31/2013   Procedure: RIGHT TOTAL KNEE ARTHROPLASTY;  Surgeon: Shelda Pal, MD;  Location: [facility];  Service: Orthopedics;  Laterality: Right;   TOTAL KNEE ARTHROPLASTY Left 03/07/2015   Procedure: TOTAL KNEE ARTHROPLASTY;  Surgeon: Durene Romans, MD;  Location: [facility];   Service: Orthopedics;  Laterality: Left;   TRANSTHORACIC ECHOCARDIOGRAM  07/02/2011   LV SYSTOLIC FUNCTION NORMAL, EF=>55%, INSIGNIFICANT PERICARDIAL EFFUSION, LEFT ATRIAL SIZE IS NORMAL, RV SYSTOLIC PRESSURE IS NORMAL. NO SIGN VALVULAR HEART DISEASE.   TUBAL LIGATION  1986   veins stripping      bilateral legs    Patient Active Problem List   Diagnosis Date Noted   Neck pain 03/13/2023   Gait abnormality 01/23/2023   Diplopia 01/23/2023   Gastritis 04/12/2020   Serrated polyp of colon 04/12/2020   Constipation 02/23/2020   Melena 02/23/2020   Esophageal reflux 02/23/2020   Condyloma acuminatum    Encounter for colonoscopy due to history of adenomatous colonic polyps    Overweight (BMI 25.0-29.9) 03/09/2015   S/P left TKA 03/07/2015   Palpitations 05/23/2014   Expected blood loss anemia 09/01/2013   S/P right TKA 08/31/2013   DJD (degenerative joint disease) of knee 08/31/2013   Pre-operative clearance, cardiac 08/17/2013   HTN (hypertension) 05/20/2013   Hyperlipidemia 05/20/2013   Migraine 02/23/2007   ALLERGIC RHINITIS 02/23/2007   TRACHEOBRONCHITIS 02/23/2007   COUGH 02/23/2007    PCP: Elfredia Nevins  REFERRING PROVIDER: Levert Feinstein, MD  REFERRING DIAG:  Diagnosis  H53.2 (ICD-10-CM) - Diplopia  R26.9 (ICD-10-CM) - Gait abnormality  M54.2 (ICD-10-CM) - Neck pain  Pt want [place] to concentrate on her cervical area   THERAPY DIAG:  Cervicalgia Decreased strength   Rationale for Evaluation and Treatment: Rehabilitation  ONSET DATE: chronic but progressive  SUBJECTIVE:                                                                                                                                                                                                         SUBJECTIVE STATEMENT: 04/29/23:  Pt states the new exercise of pushing is increasing her headache and discomfort.  Pt instructed to discontinue these.  Pain scale 2/10 neck pain constant soreness achey  posterior neck. Headache 3/10 and is constant, never decreased or changed "since the day I was born".    Evaluation: Pt states that until September she would walk a trail that took her about about an hour she use to be able to do this in about 40 minutes but she just would give out.  By the end of the trail she would have to be physically holding her head up.  She has changed to a flat level walking now but she would prefer to get back to the trails. She is having more difficulty sweeping or racking leaves because her neck is down.     PERTINENT HISTORY:  Cervical fusion 2006  , myasthenia Gravis   PAIN:  Are you having pain? Yes: NPRS scale: 1/10; worst is a 7  Pain location: cervical spine and Lt scapula  Pain description: achy Aggravating factors: over the counter meds Relieving factors: tylenol  PRECAUTIONS: None     WEIGHT BEARING RESTRICTIONS: No  FALLS:  Has patient fallen in last 6 months? Yes. Number of falls 2x once because it was dark, second ankle twisted on an uneven surface.  States that she catches herself a lot.    LIVING ENVIRONMENT: Lives with: lives alone Lives in: House/apartment Stairs: Yes: Internal: 12 steps; on right going up and External: 2 steps; none Has following equipment at home: None  OCCUPATION: retired  PLOF: Independent  PATIENT GOALS: less pain, to increase her activity level  NEXT MD VISIT: after therapy  OBJECTIVE:  Note: Objective measures were completed at Evaluation unless otherwise noted.  DIAGNOSTIC FINDINGS:  IMPRESSION: 1. Anterior cervical fusion at C5-6 without foraminal or central canal stenosis. Osseous fusion of the posterior elements. 2. At C3-4 there is a mild disc bulge. Moderate bilateral facet arthropathy. Moderate-severe bilateral foraminal stenosis. 3. At C4-5 there is severe right facet arthropathy. Mild left facet arthropathy. Severe right foraminal stenosis. Mild left foraminal stenosis. Mild central canal  stenosis. 4. At C6-7 there is right uncovertebral degenerative changes. No disc protrusion. Moderate-severe right foraminal stenosis. Moderate left foraminal stenosis. 5. At C7-T1 there is a mild broad disc bulge. Moderate bilateral facet arthropathy. Mild bilateral foraminal stenosis.     Electronically Signed   By: Elige Ko M.D.   On: 03/02/2023 07:44   COGNITION: Overall cognitive status: Within functional limits for tasks assessed  SENSATION: Not tested  POSTURE: No Significant postural limitations  PALPATION: Severe mm tightness and spasm B    CERVICAL ROM:   Active ROM A/PROM (deg) eval  Flexion 45  Extension 34  Right lateral flexion 28  Left lateral flexion 24  Right rotation 27  Left rotation 27   (Blank rows = not tested)   Cervical strength: isometric:  all motions generally 3/5   UPPER EXTREMITY ROM: all wnl   TODAY'S TREATMENT:  DATE:  04/29/23 UBE 2'fwd, 2'bkwd level 1 Standing postural 3 RTB (rows, retraction, extension) 10X each  Corner stretch 3X30" Seated thoracic excursions 5X each direction with UE assist Manual seated to bil Ut, scap, scalenes  04/24/23: Seated:  3D Thoracic excursion all directions 5x  Isometric 5x 5" sidebend, rotation, extension Cervical retraction 10x 5"  UT stretch 2x 30"  Reviewed form with single arm doorway stretch   Manual supine Bil UT, scalenes, PROM, suboccipital release x2  04/22/23 Goal and HEP reviewe Seated:  cervical ROM all directions 5X each  Cervical retractions 10X5" each  Scapular retractions 10X5" eacg Doorway chest stretch 3X30" Manual seated bil UT, scaps and scalenes  04/16/23  Sitting: cervical ROM all x 5 Cervical retraction x 10 Scapular retraction x 10   PATIENT EDUCATION:  Education details: HEP/ the importance of sitting tall  Person educated:  Patient Education method: Explanation, Verbal cues, and Handouts Education comprehension: returned demonstration  HOME EXERCISE PROGRAM: Access Code: [email or web address] URL: [email or web address] Date: 04/16/2023 Prepared by: Virgina Organ  Exercises - Seated Cervical Extension AROM  - 2 x daily - 7 x weekly - 1 sets - 5-10 reps - 3-5 hold - Seated Cervical Sidebending AROM  - 2 x daily - 7 x weekly - 1 sets - 5-10 reps - 3-5 hold - Seated Neck Sidebending ROM  - 2 x daily - 7 x weekly - 1 sets - 5-10 reps - 3-5 hold - Seated Cervical Retraction Protraction AROM  - 2 x daily - 7 x weekly - 1 sets - 5-10 reps - 3-5 hold - Seated Scapular Retraction  - 2 x daily - 7 x weekly - 1 sets - 5-10 reps - 3-5 hold  04/22/23: doorway stretch 3X30" holds  04/24/23: - Single Arm Doorway Pec Stretch at 90 Degrees Abduction  - 2 x daily - 7 x weekly - 3 sets - 3 reps - 30" hold - Seated Isometric Cervical Rotation  - 2 x daily - 4-7 x weekly - 2 sets - 5 reps - 5" hold - Seated Isometric Cervical Sidebending  - 1 x daily - 7 x weekly - 3 sets - 10 reps - 5" hold - Seated Upper Trapezius Stretch  - 2 x daily - 7 x weekly - 1 sets - 3 reps - 30" hold  ASSESSMENT:  CLINICAL IMPRESSION: Began session on UBE to warm up cervical mm prior to therex.  Discontinued isometrics as was increasing headache and discomfort.  Also instructed to complete stretch without UE assist/pulls as that was also increasing discomfort. Instructed with corner stretch for chest as states doorway is too wide for her to complete both at same time.  Began postural strengthening using red thera band to help improve supporting strength.  Pt required cues for form and hold times, otherwise no issues or complaints. Continued with thoracic excursion that was tolerated well.   Manual revealed spasms in bil UT reduced but not resolved today but with overall improvement reported at EOS.    OBJECTIVE IMPAIRMENTS: decreased activity  tolerance, decreased ROM, decreased strength, increased muscle spasms, impaired flexibility, and pain.   ACTIVITY LIMITATIONS: carrying, lifting, and locomotion level  PARTICIPATION LIMITATIONS: cleaning, laundry, shopping, and community activity  PERSONAL FACTORS: Behavior pattern, Time since onset of injury/illness/exacerbation, and 1-2 comorbidities: myasthenia Gravis,migraines  are also affecting patient's functional outcome.   REHAB POTENTIAL: Good  CLINICAL DECISION MAKING: Evolving/moderate complexity  EVALUATION COMPLEXITY: Moderate   GOALS: Goals reviewed with patient? No  SHORT TERM GOALS: Target date: 05/06/23  Pt to be I in HEP in order to decrease her pain to no greater than a 4/10 Baseline:  Goal status: IN PROGRESS  2.  PT mm strength to be increased 1/2 grade to be able to complete 15 minutes to housework without difficulty  Baseline:  Goal status: IN PROGRESS  3.  PT to be able to rotate her head 10 degrees greater each direction for safer driving  Baseline:  Goal status: IN PROGRESS  4.  MM spasms to be min to mod  Baseline:  Goal status: IN PROGRESS  LONG TERM GOALS: Target date: 05/27/22  Pt to be I in HEP in order to decrease her pain to no greater than a 2/10 Baseline:  Goal status: IN PROGRESS  2.  PT mm strength to be increased 1 grade to be able to complete 30 minutes to housework without difficulty Baseline:  Goal status: IN PROGRESS  3.  PT to be able to rotate her head 20 degrees greater each direction for safer driving Baseline:  Goal status: IN PROGRESS  4.  MM spasms to be min  Baseline:  Goal status: IN PROGRESS   PLAN:  PT FREQUENCY: 2x/week  PT DURATION: 4 weeks  PLANNED INTERVENTIONS: 97110-Therapeutic exercises, 97530- Therapeutic activity, [id number]- Neuromuscular re-education, 97535- Self Care, and 95638- Manual therapy  PLAN FOR NEXT SESSION: continue manual to decrease spasms and improve pain, progress ROM and strength  of cervical    Emeline Gins B, PTA 04/29/2023, 10:29 AM  04/29/2023, 10:29 AM

## 2023-05-01 ENCOUNTER — Ambulatory Visit (HOSPITAL_COMMUNITY): Payer: Medicare PPO | Admitting: Physical Therapy

## 2023-05-01 DIAGNOSIS — I700 Atherosclerosis of aorta: Secondary | ICD-10-CM | POA: Diagnosis not present

## 2023-05-01 DIAGNOSIS — E2839 Other primary ovarian failure: Secondary | ICD-10-CM | POA: Diagnosis not present

## 2023-05-01 DIAGNOSIS — H532 Diplopia: Secondary | ICD-10-CM | POA: Diagnosis not present

## 2023-05-01 DIAGNOSIS — M6281 Muscle weakness (generalized): Secondary | ICD-10-CM | POA: Diagnosis not present

## 2023-05-01 DIAGNOSIS — Z0001 Encounter for general adult medical examination with abnormal findings: Secondary | ICD-10-CM | POA: Diagnosis not present

## 2023-05-01 DIAGNOSIS — J45909 Unspecified asthma, uncomplicated: Secondary | ICD-10-CM | POA: Diagnosis not present

## 2023-05-01 DIAGNOSIS — N1831 Chronic kidney disease, stage 3a: Secondary | ICD-10-CM | POA: Diagnosis not present

## 2023-05-01 DIAGNOSIS — K219 Gastro-esophageal reflux disease without esophagitis: Secondary | ICD-10-CM | POA: Diagnosis not present

## 2023-05-01 DIAGNOSIS — E663 Overweight: Secondary | ICD-10-CM | POA: Diagnosis not present

## 2023-05-01 DIAGNOSIS — R269 Unspecified abnormalities of gait and mobility: Secondary | ICD-10-CM | POA: Diagnosis not present

## 2023-05-01 DIAGNOSIS — M542 Cervicalgia: Secondary | ICD-10-CM | POA: Diagnosis not present

## 2023-05-01 DIAGNOSIS — I10 Essential (primary) hypertension: Secondary | ICD-10-CM | POA: Diagnosis not present

## 2023-05-01 DIAGNOSIS — G7000 Myasthenia gravis without (acute) exacerbation: Secondary | ICD-10-CM | POA: Diagnosis not present

## 2023-05-01 NOTE — Therapy (Signed)
OUTPATIENT PHYSICAL THERAPY CERVICAL TREATMENT   Patient Name: Mary [name] MRN: [id number] DOB:[birth year], 72 y.o., female Today's Date: 05/01/2023  END OF SESSION:  PT End of Session - 05/01/23 0929     Visit Number 5    Number of Visits 12    Date for PT Re-Evaluation 05/28/23    Authorization Type humana    Authorization Time Period 12 visits 04/16/23-05/27/22    Authorization - Visit Number 5    Authorization - Number of Visits 12    Progress Note Due on Visit 10    PT Start Time 0850    PT Stop Time 0930    PT Time Calculation (min) 40 min    Activity Tolerance Patient tolerated treatment well    Behavior During Therapy WFL for tasks assessed/performed               Past Medical History:  Diagnosis Date   Anemia    Arthritis    Chronic kidney disease    idiopathic micro hematuria    Complication of anesthesia    patient states does not take much medicine to put her to sleep    Edema    ankles to knees    GERD (gastroesophageal reflux disease)    Headache(784.0)    hx of migraines    Hyperlipidemia    Mitral valve prolapse    no symptoms    Myasthenia gravis (HCC)    PONV (postoperative nausea and vomiting)    Scalp alopecia    Seizures (HCC)    1 seizure 45 years ago; unknown etiology and no meds, no seizures since then.   Past Surgical History:  Procedure Laterality Date   BIOPSY  03/24/2020   Procedure: BIOPSY;  Surgeon: Lanelle Bal, DO;  Location: [facility];  Service: Endoscopy;;  gastric duodenum   CERVICAL FUSION  2006   cervical 5-6    COLONOSCOPY N/A 03/03/2017   Procedure: COLONOSCOPY;  Surgeon: West Bali, MD;  Location: [facility];  Service: Endoscopy;  Laterality: N/A;  1:15 pm   COLONOSCOPY WITH PROPOFOL N/A 03/24/2020   Procedure: COLONOSCOPY WITH PROPOFOL;  Surgeon: Lanelle Bal, DO;  Location: [facility];  Service: Endoscopy;  Laterality: N/A;  10:00am   COLONOSCOPY WITH PROPOFOL N/A 10/02/2020    Procedure: COLONOSCOPY WITH PROPOFOL;  Surgeon: Lanelle Bal, DO;  Location: [facility];  Service: Endoscopy;  Laterality: N/A;  ASA II / 9:00   ESOPHAGOGASTRODUODENOSCOPY (EGD) WITH PROPOFOL N/A 03/24/2020   Procedure: ESOPHAGOGASTRODUODENOSCOPY (EGD) WITH PROPOFOL;  Surgeon: Lanelle Bal, DO;  Location: [facility];  Service: Endoscopy;  Laterality: N/A;   HEMORRHOID SURGERY N/A 06/23/2017   Procedure: ANOSCOPY WITH REMOVAL OF SINGLE LESION;  Surgeon: Franky Macho, MD;  Location: [facility];  Service: General;  Laterality: N/A;   MYOCARDIAL PERFUSION STUDY  07/02/2011   NO SCINTIGRAPHIC EVIDENCE OF INDUCIBLE MYOCARDIAL ISCHEMIA. POST-STRESS EF IS 87%.EXERCISE CAPACITY 9 METS. EKG SHOWS NSR AT 69. THERE IS HORIZONTAL ST DEPRESSION WITH EXERCISE IN II, III, AVF SEEN BEST AT EARLY RECOVERY. THIS IS PRESENT IN V3-V5 AS WELL AND RETURNS TO BASELINE AT THE END OF THE TEST. NORMAL STUDY.   POLYPECTOMY  03/24/2020   Procedure: POLYPECTOMY;  Surgeon: Lanelle Bal, DO;  Location: [facility];  Service: Endoscopy;;  colon    POLYPECTOMY  10/02/2020   Procedure: POLYPECTOMY;  Surgeon: Lanelle Bal, DO;  Location: [facility];  Service: Endoscopy;;  RENAL ARTERY DOPPLER  12/28/2003   CELIAC ARTERY: AT REST, 168.4 CM/S; INSPRIATION, 177.6 CM/S. ARCUATE LIGAMENT COMPRESSION SYNDROME. R & L KIDNEYS: RIGHT KIDNEY MEASURES SOME WHAT SMALLER THAN LEFT. KIDNEY ARE ESSENTIALLY SYMMETRICAL IN SHAPE WITH NO OBVIOUS ABNORMALITY VISUALIZED. R & L RENAL ARTERIES: DEMONSTRATE NORMAL SPECTRA. NO SUGGESTION OF DIAMETER REDUCTION, DISSECTION, FIBROMUSCULAR DYSPLASIA OR ANY OTHER VASCULAR ABNOR   TOTAL KNEE ARTHROPLASTY Right 08/31/2013   Procedure: RIGHT TOTAL KNEE ARTHROPLASTY;  Surgeon: Shelda Pal, MD;  Location: [facility];  Service: Orthopedics;  Laterality: Right;   TOTAL KNEE ARTHROPLASTY Left 03/07/2015   Procedure: TOTAL KNEE ARTHROPLASTY;  Surgeon: Durene Romans, MD;  Location: [facility];   Service: Orthopedics;  Laterality: Left;   TRANSTHORACIC ECHOCARDIOGRAM  07/02/2011   LV SYSTOLIC FUNCTION NORMAL, EF=>55%, INSIGNIFICANT PERICARDIAL EFFUSION, LEFT ATRIAL SIZE IS NORMAL, RV SYSTOLIC PRESSURE IS NORMAL. NO SIGN VALVULAR HEART DISEASE.   TUBAL LIGATION  1986   veins stripping      bilateral legs    Patient Active Problem List   Diagnosis Date Noted   Neck pain 03/13/2023   Gait abnormality 01/23/2023   Diplopia 01/23/2023   Gastritis 04/12/2020   Serrated polyp of colon 04/12/2020   Constipation 02/23/2020   Melena 02/23/2020   Esophageal reflux 02/23/2020   Condyloma acuminatum    Encounter for colonoscopy due to history of adenomatous colonic polyps    Overweight (BMI 25.0-29.9) 03/09/2015   S/P left TKA 03/07/2015   Palpitations 05/23/2014   Expected blood loss anemia 09/01/2013   S/P right TKA 08/31/2013   DJD (degenerative joint disease) of knee 08/31/2013   Pre-operative clearance, cardiac 08/17/2013   HTN (hypertension) 05/20/2013   Hyperlipidemia 05/20/2013   Migraine 02/23/2007   ALLERGIC RHINITIS 02/23/2007   TRACHEOBRONCHITIS 02/23/2007   COUGH 02/23/2007    PCP: Elfredia Nevins  REFERRING PROVIDER: Levert Feinstein, MD  REFERRING DIAG:  Diagnosis  H53.2 (ICD-10-CM) - Diplopia  R26.9 (ICD-10-CM) - Gait abnormality  M54.2 (ICD-10-CM) - Neck pain  Pt want [place] to concentrate on her cervical area   THERAPY DIAG:  Cervicalgia Decreased strength   Rationale for Evaluation and Treatment: Rehabilitation  ONSET DATE: chronic but progressive  SUBJECTIVE:                                                                                                                                                                                                         SUBJECTIVE STATEMENT: 05/01/23:  Pt states that she feels much better than she did when she first started her therapy.  Evaluation: Pt  states that until September she would walk a trail that took her about  about an hour she use to be able to do this in about 40 minutes but she just would give out.  By the end of the trail she would have to be physically holding her head up.  She has changed to a flat level walking now but she would prefer to get back to the trails. She is having more difficulty sweeping or racking leaves because her neck is down.     PERTINENT HISTORY:  Cervical fusion 2006  , myasthenia Gravis   PAIN:  Are you having pain? Yes: NPRS scale: 1/10; worst is a 7  Pain location: cervical spine and Lt scapula  Pain description: achy Aggravating factors: over the counter meds Relieving factors: tylenol  PRECAUTIONS: None     WEIGHT BEARING RESTRICTIONS: No  FALLS:  Has patient fallen in last 6 months? Yes. Number of falls 2x once because it was dark, second ankle twisted on an uneven surface.  States that she catches herself a lot.    LIVING ENVIRONMENT: Lives with: lives alone Lives in: House/apartment Stairs: Yes: Internal: 12 steps; on right going up and External: 2 steps; none Has following equipment at home: None  OCCUPATION: retired  PLOF: Independent  PATIENT GOALS: less pain, to increase her activity level  NEXT MD VISIT: after therapy  OBJECTIVE:  Note: Objective measures were completed at Evaluation unless otherwise noted.  DIAGNOSTIC FINDINGS:  IMPRESSION: 1. Anterior cervical fusion at C5-6 without foraminal or central canal stenosis. Osseous fusion of the posterior elements. 2. At C3-4 there is a mild disc bulge. Moderate bilateral facet arthropathy. Moderate-severe bilateral foraminal stenosis. 3. At C4-5 there is severe right facet arthropathy. Mild left facet arthropathy. Severe right foraminal stenosis. Mild left foraminal stenosis. Mild central canal stenosis. 4. At C6-7 there is right uncovertebral degenerative changes. No disc protrusion. Moderate-severe right foraminal stenosis. Moderate left foraminal stenosis. 5. At C7-T1 there is a  mild broad disc bulge. Moderate bilateral facet arthropathy. Mild bilateral foraminal stenosis.     Electronically Signed   By: Elige Ko M.D.   On: 03/02/2023 07:44   COGNITION: Overall cognitive status: Within functional limits for tasks assessed  SENSATION: Not tested  POSTURE: No Significant postural limitations  PALPATION: Severe mm tightness and spasm B    CERVICAL ROM:   Active ROM A/PROM (deg) eval  Flexion 45  Extension 34  Right lateral flexion 28  Left lateral flexion 24  Right rotation 27  Left rotation 27   (Blank rows = not tested)   Cervical strength: isometric:  all motions generally 3/5   UPPER EXTREMITY ROM: all wnl   TODAY'S TREATMENT:                                                                                                                              DATE:  05/01/23 Seated: Cervical and thoracic excursion x  3 Upper trap stretch 15" x 3 Retro shoulder circles x 10  W back with 2# x 10 X to V x 10  Isometric cervical for SB and extension 5" hold x 5 Postural exercises RTB Rows, scapular retraction and shoulder extension x 10  Manual supine to include traction, upper trap stretch, efflurage/pettrisage 04/29/23 UBE 2'fwd, 2'bkwd level 1 Standing postural 3 RTB (rows, retraction, extension) 10X each  Corner stretch 3X30" Seated thoracic excursions 5X each direction with UE assist Manual seated to bil Ut, scap, scalenes  04/24/23: Seated:  3D Thoracic excursion all directions 5x  Isometric 5x 5" sidebend, rotation, extension Cervical retraction 10x 5"  UT stretch 2x 30"  Reviewed form with single arm doorway stretch   Manual supine Bil UT, scalenes, PROM, suboccipital release x2  PATIENT EDUCATION:  Education details: HEP/ the importance of sitting tall  Person educated: Patient Education method: Explanation, Verbal cues, and Handouts Education comprehension: returned demonstration  HOME EXERCISE PROGRAM: Access  Code: [email or web address] URL: [email or web address] Date: 04/16/2023 Prepared by: Virgina Organ  Exercises - Seated Cervical Extension AROM  - 2 x daily - 7 x weekly - 1 sets - 5-10 reps - 3-5 hold - Seated Cervical Sidebending AROM  - 2 x daily - 7 x weekly - 1 sets - 5-10 reps - 3-5 hold - Seated Neck Sidebending ROM  - 2 x daily - 7 x weekly - 1 sets - 5-10 reps - 3-5 hold - Seated Cervical Retraction Protraction AROM  - 2 x daily - 7 x weekly - 1 sets - 5-10 reps - 3-5 hold - Seated Scapular Retraction  - 2 x daily - 7 x weekly - 1 sets - 5-10 reps - 3-5 hold  04/22/23: doorway stretch 3X30" holds  04/24/23: - Single Arm Doorway Pec Stretch at 90 Degrees Abduction  - 2 x daily - 7 x weekly - 3 sets - 3 reps - 30" hold - Seated Isometric Cervical Rotation  - 2 x daily - 4-7 x weekly - 2 sets - 5 reps - 5" hold - Seated Isometric Cervical Sidebending  - 1 x daily - 7 x weekly - 3 sets - 10 reps - 5" hold - Seated Upper Trapezius Stretch  - 2 x daily - 7 x weekly - 1 sets - 3 reps - 30" hold  ASSESSMENT:  CLINICAL IMPRESSION: Continued to work on cervical stability.  Added modified isometric exercises for cervical as these mm are weak and need to be strengthen.  Pt has noted improved ROM since evaluation. PT needed verbal cuing for  postural strengthening using red thera band;able to demonstrate good form following cuing given as HEP..   Manual revealed decreased spasms in bil UT reduced .  OBJECTIVE IMPAIRMENTS: decreased activity tolerance, decreased ROM, decreased strength, increased muscle spasms, impaired flexibility, and pain.   ACTIVITY LIMITATIONS: carrying, lifting, and locomotion level  PARTICIPATION LIMITATIONS: cleaning, laundry, shopping, and community activity  PERSONAL FACTORS: Behavior pattern, Time since onset of injury/illness/exacerbation, and 1-2 comorbidities: myasthenia Gravis,migraines  are also affecting patient's functional outcome.   REHAB POTENTIAL:  Good  CLINICAL DECISION MAKING: Evolving/moderate complexity  EVALUATION COMPLEXITY: Moderate   GOALS: Goals reviewed with patient? No  SHORT TERM GOALS: Target date: 05/06/23  Pt to be I in HEP in order to decrease her pain to no greater than a 4/10 Baseline:  Goal status: IN PROGRESS  2.  PT mm strength to be increased 1/2 grade to be able to complete 15 minutes  to housework without difficulty  Baseline:  Goal status: IN PROGRESS  3.  PT to be able to rotate her head 10 degrees greater each direction for safer driving  Baseline:  Goal status: IN PROGRESS  4.  MM spasms to be min to mod  Baseline:  Goal status: IN PROGRESS  LONG TERM GOALS: Target date: 05/27/22  Pt to be I in HEP in order to decrease her pain to no greater than a 2/10 Baseline:  Goal status: IN PROGRESS  2.  PT mm strength to be increased 1 grade to be able to complete 30 minutes to housework without difficulty Baseline:  Goal status: IN PROGRESS  3.  PT to be able to rotate her head 20 degrees greater each direction for safer driving Baseline:  Goal status: IN PROGRESS  4.  MM spasms to be min  Baseline:  Goal status: IN PROGRESS   PLAN:  PT FREQUENCY: 2x/week  PT DURATION: 4 weeks  PLANNED INTERVENTIONS: 97110-Therapeutic exercises, 97530- Therapeutic activity, [id number]- Neuromuscular re-education, 97535- Self Care, and [id number]- Manual therapy  PLAN FOR NEXT SESSION: continue manual to decrease spasms and improve pain, progress ROM and strength of cervical   Virgina Organ, PT CLT [phone]  05/01/2023, 9:30 AM

## 2023-05-08 ENCOUNTER — Encounter (HOSPITAL_COMMUNITY): Payer: Medicare PPO | Admitting: Physical Therapy

## 2023-05-08 ENCOUNTER — Ambulatory Visit (HOSPITAL_COMMUNITY): Payer: Medicare PPO

## 2023-05-08 DIAGNOSIS — M6281 Muscle weakness (generalized): Secondary | ICD-10-CM | POA: Diagnosis not present

## 2023-05-08 DIAGNOSIS — H532 Diplopia: Secondary | ICD-10-CM | POA: Diagnosis not present

## 2023-05-08 DIAGNOSIS — M542 Cervicalgia: Secondary | ICD-10-CM | POA: Diagnosis not present

## 2023-05-08 DIAGNOSIS — R269 Unspecified abnormalities of gait and mobility: Secondary | ICD-10-CM | POA: Diagnosis not present

## 2023-05-08 NOTE — Therapy (Signed)
OUTPATIENT PHYSICAL THERAPY CERVICAL TREATMENT   Patient Name: Mary [name] MRN: [id number] DOB:05-Jul-1950, 72 y.o., female Today's Date: 05/08/2023  END OF SESSION:  PT End of Session - 05/08/23 1148     Visit Number 6    Number of Visits 12    Date for PT Re-Evaluation 05/28/23    Authorization Type humana    Authorization Time Period 12 visits 04/16/23-05/27/22    Authorization - Visit Number 6    Authorization - Number of Visits 12    Progress Note Due on Visit 10    PT Start Time 1148    PT Stop Time 1228    PT Time Calculation (min) 40 min    Activity Tolerance Patient tolerated treatment well    Behavior During Therapy WFL for tasks assessed/performed               Past Medical History:  Diagnosis Date   Anemia    Arthritis    Chronic kidney disease    idiopathic micro hematuria    Complication of anesthesia    patient states does not take much medicine to put her to sleep    Edema    ankles to knees    GERD (gastroesophageal reflux disease)    Headache(784.0)    hx of migraines    Hyperlipidemia    Mitral valve prolapse    no symptoms    Myasthenia gravis (HCC)    PONV (postoperative nausea and vomiting)    Scalp alopecia    Seizures (HCC)    1 seizure 45 years ago; unknown etiology and no meds, no seizures since then.   Past Surgical History:  Procedure Laterality Date   BIOPSY  03/24/2020   Procedure: BIOPSY;  Surgeon: Lanelle Bal, DO;  Location: [facility];  Service: Endoscopy;;  gastric duodenum   CERVICAL FUSION  2006   cervical 5-6    COLONOSCOPY N/A 03/03/2017   Procedure: COLONOSCOPY;  Surgeon: West Bali, MD;  Location: [facility];  Service: Endoscopy;  Laterality: N/A;  1:15 pm   COLONOSCOPY WITH PROPOFOL N/A 03/24/2020   Procedure: COLONOSCOPY WITH PROPOFOL;  Surgeon: Lanelle Bal, DO;  [facility]: [facility];  Service: Endoscopy;  Laterality: N/A;  10:00am   COLONOSCOPY WITH PROPOFOL N/A 10/02/2020    Procedure: COLONOSCOPY WITH PROPOFOL;  Surgeon: Lanelle Bal, DO;  [facility]: [facility];  Service: Endoscopy;  Laterality: N/A;  ASA II / 9:00   ESOPHAGOGASTRODUODENOSCOPY (EGD) WITH PROPOFOL N/A 03/24/2020   Procedure: ESOPHAGOGASTRODUODENOSCOPY (EGD) WITH PROPOFOL;  Surgeon: Lanelle Bal, DO;  [facility]: [facility];  Service: Endoscopy;  Laterality: N/A;   HEMORRHOID SURGERY N/A 06/23/2017   Procedure: ANOSCOPY WITH REMOVAL OF SINGLE LESION;  Surgeon: Franky Macho, MD;  Location: [facility];  Service: General;  Laterality: N/A;   MYOCARDIAL PERFUSION STUDY  07/02/2011   NO SCINTIGRAPHIC EVIDENCE OF INDUCIBLE MYOCARDIAL ISCHEMIA. POST-STRESS EF IS 87%.EXERCISE CAPACITY 9 METS. EKG SHOWS NSR AT 69. THERE IS HORIZONTAL ST DEPRESSION WITH EXERCISE IN II, III, AVF SEEN BEST AT EARLY RECOVERY. THIS IS PRESENT IN V3-V5 AS WELL AND RETURNS TO BASELINE AT THE END OF THE TEST. NORMAL STUDY.   POLYPECTOMY  03/24/2020   Procedure: POLYPECTOMY;  Surgeon: Lanelle Bal, DO;  [facility];  Service: Endoscopy;;  colon    POLYPECTOMY  10/02/2020   Procedure: POLYPECTOMY;  Surgeon: Lanelle Bal, DO;  [facility]: [facility];  Service: Endoscopy;;  RENAL ARTERY DOPPLER  12/28/2003   CELIAC ARTERY: AT REST, 168.4 CM/S; INSPRIATION, 177.6 CM/S. ARCUATE LIGAMENT COMPRESSION SYNDROME. R & L KIDNEYS: RIGHT KIDNEY MEASURES SOME WHAT SMALLER THAN LEFT. KIDNEY ARE ESSENTIALLY SYMMETRICAL IN SHAPE WITH NO OBVIOUS ABNORMALITY VISUALIZED. R & L RENAL ARTERIES: DEMONSTRATE NORMAL SPECTRA. NO SUGGESTION OF DIAMETER REDUCTION, DISSECTION, FIBROMUSCULAR DYSPLASIA OR ANY OTHER VASCULAR ABNOR   TOTAL KNEE ARTHROPLASTY Right 08/31/2013   Procedure: RIGHT TOTAL KNEE ARTHROPLASTY;  Surgeon: Shelda Pal, MD;  Location: [facility];  Service: Orthopedics;  Laterality: Right;   TOTAL KNEE ARTHROPLASTY Left 03/07/2015   Procedure: TOTAL KNEE ARTHROPLASTY;  Surgeon: Durene Romans, MD;  Location: [facility];   Service: Orthopedics;  Laterality: Left;   TRANSTHORACIC ECHOCARDIOGRAM  07/02/2011   LV SYSTOLIC FUNCTION NORMAL, EF=>55%, INSIGNIFICANT PERICARDIAL EFFUSION, LEFT ATRIAL SIZE IS NORMAL, RV SYSTOLIC PRESSURE IS NORMAL. NO SIGN VALVULAR HEART DISEASE.   TUBAL LIGATION  1986   veins stripping      bilateral legs    Patient Active Problem List   Diagnosis Date Noted   Neck pain 03/13/2023   Gait abnormality 01/23/2023   Diplopia 01/23/2023   Gastritis 04/12/2020   Serrated polyp of colon 04/12/2020   Constipation 02/23/2020   Melena 02/23/2020   Esophageal reflux 02/23/2020   Condyloma acuminatum    Encounter for colonoscopy due to history of adenomatous colonic polyps    Overweight (BMI 25.0-29.9) 03/09/2015   S/P left TKA 03/07/2015   Palpitations 05/23/2014   Expected blood loss anemia 09/01/2013   S/P right TKA 08/31/2013   DJD (degenerative joint disease) of knee 08/31/2013   Pre-operative clearance, cardiac 08/17/2013   HTN (hypertension) 05/20/2013   Hyperlipidemia 05/20/2013   Migraine 02/23/2007   ALLERGIC RHINITIS 02/23/2007   TRACHEOBRONCHITIS 02/23/2007   COUGH 02/23/2007    PCP: Elfredia Nevins  REFERRING PROVIDER: Levert Feinstein, MD  REFERRING DIAG:  Diagnosis  H53.2 (ICD-10-CM) - Diplopia  R26.9 (ICD-10-CM) - Gait abnormality  M54.2 (ICD-10-CM) - Neck pain  Pt want [place] to concentrate on her cervical area   THERAPY DIAG:  Cervicalgia Decreased strength   Rationale for Evaluation and Treatment: Rehabilitation  ONSET DATE: chronic but progressive  SUBJECTIVE:                                                                                                                                                                                                         SUBJECTIVE STATEMENT: 05/08/23:  2-3/10 neck pain today.  Trying to remember to exercise at home.    Evaluation: Pt states that  until September she would walk a trail that took her about about an hour she  use to be able to do this in about 40 minutes but she just would give out.  By the end of the trail she would have to be physically holding her head up.  She has changed to a flat level walking now but she would prefer to get back to the trails. She is having more difficulty sweeping or racking leaves because her neck is down.     PERTINENT HISTORY:  Cervical fusion 2006  , myasthenia Gravis   PAIN:  Are you having pain? Yes: NPRS scale: 1/10; worst is a 7  Pain location: cervical spine and Lt scapula  Pain description: achy Aggravating factors: over the counter meds Relieving factors: tylenol  PRECAUTIONS: None     WEIGHT BEARING RESTRICTIONS: No  FALLS:  Has patient fallen in last 6 months? Yes. Number of falls 2x once because it was dark, second ankle twisted on an uneven surface.  States that she catches herself a lot.    LIVING ENVIRONMENT: Lives with: lives alone Lives in: House/apartment Stairs: Yes: Internal: 12 steps; on right going up and External: 2 steps; none Has following equipment at home: None  OCCUPATION: retired  PLOF: Independent  PATIENT GOALS: less pain, to increase her activity level  NEXT MD VISIT: after therapy  OBJECTIVE:  Note: Objective measures were completed at Evaluation unless otherwise noted.  DIAGNOSTIC FINDINGS:  IMPRESSION: 1. Anterior cervical fusion at C5-6 without foraminal or central canal stenosis. Osseous fusion of the posterior elements. 2. At C3-4 there is a mild disc bulge. Moderate bilateral facet arthropathy. Moderate-severe bilateral foraminal stenosis. 3. At C4-5 there is severe right facet arthropathy. Mild left facet arthropathy. Severe right foraminal stenosis. Mild left foraminal stenosis. Mild central canal stenosis. 4. At C6-7 there is right uncovertebral degenerative changes. No disc protrusion. Moderate-severe right foraminal stenosis. Moderate left foraminal stenosis. 5. At C7-T1 there is a mild broad disc  bulge. Moderate bilateral facet arthropathy. Mild bilateral foraminal stenosis.     Electronically Signed   By: Elige Ko M.D.   On: 03/02/2023 07:44   COGNITION: Overall cognitive status: Within functional limits for tasks assessed  SENSATION: Not tested  POSTURE: No Significant postural limitations  PALPATION: Severe mm tightness and spasm B    CERVICAL ROM:   Active ROM A/PROM (deg) eval  Flexion 45  Extension 34  Right lateral flexion 28  Left lateral flexion 24  Right rotation 27  Left rotation 27   (Blank rows = not tested)   Cervical strength: isometric:  all motions generally 3/5   UPPER EXTREMITY ROM: all wnl   TODAY'S TREATMENT:                                                                                                                              DATE:  05/08/23 UBE dynamic warm up 2' forward and 2'  backwards Seated: Cervical retractions with 3"hold x 10 Thoracic extension over chair x 10 Upper trap stretch 3 x 20" each RTB scapular retractions 2 x 10 RTB shoulder extensions 2 x 10 Manual STM to bilateral upper traps, levator and cervical spine paraspinals to decrease pain and cervical tightness x 9'  05/01/23 Seated: Cervical and thoracic excursion x 3 Upper trap stretch 15" x 3 Retro shoulder circles x 10  W back with 2# x 10 X to V x 10  Isometric cervical for SB and extension 5" hold x 5 Postural exercises RTB Rows, scapular retraction and shoulder extension x 10  Manual supine to include traction, upper trap stretch, efflurage/pettrisage 04/29/23 UBE 2'fwd, 2'bkwd level 1 Standing postural 3 RTB (rows, retraction, extension) 10X each  Corner stretch 3X30" Seated thoracic excursions 5X each direction with UE assist Manual seated to bil Ut, scap, scalenes  04/24/23: Seated:  3D Thoracic excursion all directions 5x  Isometric 5x 5" sidebend, rotation, extension Cervical retraction 10x 5"  UT stretch 2x 30"  Reviewed form  with single arm doorway stretch   Manual supine Bil UT, scalenes, PROM, suboccipital release x2  PATIENT EDUCATION:  Education details: HEP/ the importance of sitting tall  Person educated: Patient Education method: Explanation, Verbal cues, and Handouts Education comprehension: returned demonstration  HOME EXERCISE PROGRAM: Access Code: [email or web address] URL: [email or web address] Date: 04/16/2023 Prepared by: Virgina Organ  Exercises - Seated Cervical Extension AROM  - 2 x daily - 7 x weekly - 1 sets - 5-10 reps - 3-5 hold - Seated Cervical Sidebending AROM  - 2 x daily - 7 x weekly - 1 sets - 5-10 reps - 3-5 hold - Seated Neck Sidebending ROM  - 2 x daily - 7 x weekly - 1 sets - 5-10 reps - 3-5 hold - Seated Cervical Retraction Protraction AROM  - 2 x daily - 7 x weekly - 1 sets - 5-10 reps - 3-5 hold - Seated Scapular Retraction  - 2 x daily - 7 x weekly - 1 sets - 5-10 reps - 3-5 hold  04/22/23: doorway stretch 3X30" holds  04/24/23: - Single Arm Doorway Pec Stretch at 90 Degrees Abduction  - 2 x daily - 7 x weekly - 3 sets - 3 reps - 30" hold - Seated Isometric Cervical Rotation  - 2 x daily - 4-7 x weekly - 2 sets - 5 reps - 5" hold - Seated Isometric Cervical Sidebending  - 1 x daily - 7 x weekly - 3 sets - 10 reps - 5" hold - Seated Upper Trapezius Stretch  - 2 x daily - 7 x weekly - 1 sets - 3 reps - 30" hold  ASSESSMENT:  CLINICAL IMPRESSION: Today's session with continued work on cervical mobility and postural strengthening.  Continued with STM to decrease cervical tightness.  Of note patient reports daily headaches; some tenderness temporalis left.  Discussed trigger point dry needling with patient and issued information. Also discussed massage with her as she feels manual has been very beneficial.   Patient will benefit from continued skilled therapy services to address deficits and promote return to optimal function.      OBJECTIVE IMPAIRMENTS: decreased  activity tolerance, decreased ROM, decreased strength, increased muscle spasms, impaired flexibility, and pain.   ACTIVITY LIMITATIONS: carrying, lifting, and locomotion level  PARTICIPATION LIMITATIONS: cleaning, laundry, shopping, and community activity  PERSONAL FACTORS: Behavior pattern, Time since onset of injury/illness/exacerbation, and 1-2 comorbidities: myasthenia Gravis,migraines  are also affecting patient's functional  outcome.   REHAB POTENTIAL: Good  CLINICAL DECISION MAKING: Evolving/moderate complexity  EVALUATION COMPLEXITY: Moderate   GOALS: Goals reviewed with patient? No  SHORT TERM GOALS: Target date: 05/06/23  Pt to be I in HEP in order to decrease her pain to no greater than a 4/10 Baseline:  Goal status: IN PROGRESS  2.  PT mm strength to be increased 1/2 grade to be able to complete 15 minutes to housework without difficulty  Baseline:  Goal status: IN PROGRESS  3.  PT to be able to rotate her head 10 degrees greater each direction for safer driving  Baseline:  Goal status: IN PROGRESS  4.  MM spasms to be min to mod  Baseline:  Goal status: IN PROGRESS  LONG TERM GOALS: Target date: 05/27/22  Pt to be I in HEP in order to decrease her pain to no greater than a 2/10 Baseline:  Goal status: IN PROGRESS  2.  PT mm strength to be increased 1 grade to be able to complete 30 minutes to housework without difficulty Baseline:  Goal status: IN PROGRESS  3.  PT to be able to rotate her head 20 degrees greater each direction for safer driving Baseline:  Goal status: IN PROGRESS  4.  MM spasms to be min  Baseline:  Goal status: IN PROGRESS   PLAN:  PT FREQUENCY: 2x/week  PT DURATION: 4 weeks  PLANNED INTERVENTIONS: 97110-Therapeutic exercises, 97530- Therapeutic activity, [id number]- Neuromuscular re-education, 97535- Self Care, and [id number]- Manual therapy  PLAN FOR NEXT SESSION: continue manual to decrease spasms and improve pain, progress ROM and  strength of cervical ; discuss follow up with massage therapy.  12:27 PM, 05/08/23 Desira Alessandrini Small Kamia Insalaco MPT [place] [facility] [place] [phone]

## 2023-05-08 NOTE — Patient Instructions (Signed)

## 2023-05-12 ENCOUNTER — Other Ambulatory Visit: Payer: Self-pay | Admitting: Gastroenterology

## 2023-05-13 ENCOUNTER — Ambulatory Visit (HOSPITAL_COMMUNITY): Payer: Medicare PPO

## 2023-05-13 ENCOUNTER — Encounter (HOSPITAL_COMMUNITY): Payer: Self-pay

## 2023-05-13 DIAGNOSIS — D518 Other vitamin B12 deficiency anemias: Secondary | ICD-10-CM | POA: Diagnosis not present

## 2023-05-13 DIAGNOSIS — Z0001 Encounter for general adult medical examination with abnormal findings: Secondary | ICD-10-CM | POA: Diagnosis not present

## 2023-05-13 DIAGNOSIS — M6281 Muscle weakness (generalized): Secondary | ICD-10-CM | POA: Diagnosis not present

## 2023-05-13 DIAGNOSIS — R269 Unspecified abnormalities of gait and mobility: Secondary | ICD-10-CM | POA: Diagnosis not present

## 2023-05-13 DIAGNOSIS — M542 Cervicalgia: Secondary | ICD-10-CM | POA: Diagnosis not present

## 2023-05-13 DIAGNOSIS — H532 Diplopia: Secondary | ICD-10-CM | POA: Diagnosis not present

## 2023-05-13 DIAGNOSIS — E559 Vitamin D deficiency, unspecified: Secondary | ICD-10-CM | POA: Diagnosis not present

## 2023-05-13 DIAGNOSIS — G9332 Myalgic encephalomyelitis/chronic fatigue syndrome: Secondary | ICD-10-CM | POA: Diagnosis not present

## 2023-05-13 NOTE — Therapy (Signed)
 OUTPATIENT PHYSICAL THERAPY CERVICAL TREATMENT   Patient Name: Mary [name] MRN: [id number] DOB:18-Aug-1950, 72 y.o., female Today's Date: 05/13/2023  END OF SESSION:  PT End of Session - 05/13/23 1546     Visit Number 7    Number of Visits 12    Date for PT Re-Evaluation 05/28/23    Authorization Type humana    Authorization Time Period 12 visits 04/16/23-05/27/22    Authorization - Visit Number 7    Authorization - Number of Visits 12    Progress Note Due on Visit 10    PT Start Time 0934    PT Stop Time 1012    PT Time Calculation (min) 38 min    Activity Tolerance Patient tolerated treatment well    Behavior During Therapy WFL for tasks assessed/performed                Past Medical History:  Diagnosis Date   Anemia    Arthritis    Chronic kidney disease    idiopathic micro hematuria    Complication of anesthesia    patient states does not take much medicine to put her to sleep    Edema    ankles to knees    GERD (gastroesophageal reflux disease)    Headache(784.0)    hx of migraines    Hyperlipidemia    Mitral valve prolapse    no symptoms    Myasthenia gravis (HCC)    PONV (postoperative nausea and vomiting)    Scalp alopecia    Seizures (HCC)    1 seizure 45 years ago; unknown etiology and no meds, no seizures since then.   Past Surgical History:  Procedure Laterality Date   BIOPSY  03/24/2020   Procedure: BIOPSY;  Surgeon: Cindie Carlin POUR, DO;  Location: [facility];  Service: Endoscopy;;  gastric duodenum   CERVICAL FUSION  2006   cervical 5-6    COLONOSCOPY N/A 03/03/2017   Procedure: COLONOSCOPY;  Surgeon: Harvey Margo CROME, MD;  Location: [facility];  Service: Endoscopy;  Laterality: N/A;  1:15 pm   COLONOSCOPY WITH PROPOFOL  N/A 03/24/2020   Procedure: COLONOSCOPY WITH PROPOFOL ;  Surgeon: Cindie Carlin POUR, DO;  Location: [facility];  Service: Endoscopy;  Laterality: N/A;  10:00am   COLONOSCOPY WITH PROPOFOL  N/A 10/02/2020    Procedure: COLONOSCOPY WITH PROPOFOL ;  Surgeon: Cindie Carlin POUR, DO;  Location: [facility];  Service: Endoscopy;  Laterality: N/A;  ASA II / 9:00   ESOPHAGOGASTRODUODENOSCOPY (EGD) WITH PROPOFOL  N/A 03/24/2020   Procedure: ESOPHAGOGASTRODUODENOSCOPY (EGD) WITH PROPOFOL ;  Surgeon: Cindie Carlin POUR, DO;  Location: [facility];  Service: Endoscopy;  Laterality: N/A;   HEMORRHOID SURGERY N/A 06/23/2017   Procedure: ANOSCOPY WITH REMOVAL OF SINGLE LESION;  Surgeon: Mavis Anes, MD;  Location: [facility];  Service: General;  Laterality: N/A;   MYOCARDIAL PERFUSION STUDY  07/02/2011   NO SCINTIGRAPHIC EVIDENCE OF INDUCIBLE MYOCARDIAL ISCHEMIA. POST-STRESS EF IS 87%.EXERCISE CAPACITY 9 METS. EKG SHOWS NSR AT 69. THERE IS HORIZONTAL ST DEPRESSION WITH EXERCISE IN II, III, AVF SEEN BEST AT EARLY RECOVERY. THIS IS PRESENT IN V3-V5 AS WELL AND RETURNS TO BASELINE AT THE END OF THE TEST. NORMAL STUDY.   POLYPECTOMY  03/24/2020   Procedure: POLYPECTOMY;  Surgeon: Cindie Carlin POUR, DO;  [facility]: [facility];  Service: Endoscopy;;  colon    POLYPECTOMY  10/02/2020   Procedure: POLYPECTOMY;  Surgeon: Cindie Carlin POUR, DO;  Location: [facility];  Service: Endoscopy;;  RENAL ARTERY DOPPLER  12/28/2003   CELIAC ARTERY: AT REST, 168.4 CM/S; INSPRIATION, 177.6 CM/S. ARCUATE LIGAMENT COMPRESSION SYNDROME. R & L KIDNEYS: RIGHT KIDNEY MEASURES SOME WHAT SMALLER THAN LEFT. KIDNEY ARE ESSENTIALLY SYMMETRICAL IN SHAPE WITH NO OBVIOUS ABNORMALITY VISUALIZED. R & L RENAL ARTERIES: DEMONSTRATE NORMAL SPECTRA . NO SUGGESTION OF DIAMETER REDUCTION, DISSECTION, FIBROMUSCULAR DYSPLASIA OR ANY OTHER VASCULAR ABNOR   TOTAL KNEE ARTHROPLASTY Right 08/31/2013   Procedure: RIGHT TOTAL KNEE ARTHROPLASTY;  Surgeon: Donnice JONETTA Car, MD;  Location: [facility];  Service: Orthopedics;  Laterality: Right;   TOTAL KNEE ARTHROPLASTY Left 03/07/2015   Procedure: TOTAL KNEE ARTHROPLASTY;  Surgeon: Donnice Car, MD;  Location: [facility];   Service: Orthopedics;  Laterality: Left;   TRANSTHORACIC ECHOCARDIOGRAM  07/02/2011   LV SYSTOLIC FUNCTION NORMAL, EF=>55%, INSIGNIFICANT PERICARDIAL EFFUSION, LEFT ATRIAL SIZE IS NORMAL, RV SYSTOLIC PRESSURE IS NORMAL. NO SIGN VALVULAR HEART DISEASE.   TUBAL LIGATION  1986   veins stripping      bilateral legs    Patient Active Problem List   Diagnosis Date Noted   Neck pain 03/13/2023   Gait abnormality 01/23/2023   Diplopia 01/23/2023   Gastritis 04/12/2020   Serrated polyp of colon 04/12/2020   Constipation 02/23/2020   Melena 02/23/2020   Esophageal reflux 02/23/2020   Condyloma acuminatum    Encounter for colonoscopy due to history of adenomatous colonic polyps    Overweight (BMI 25.0-29.9) 03/09/2015   S/P left TKA 03/07/2015   Palpitations 05/23/2014   Expected blood loss anemia 09/01/2013   S/P right TKA 08/31/2013   DJD (degenerative joint disease) of knee 08/31/2013   Pre-operative clearance, cardiac 08/17/2013   HTN (hypertension) 05/20/2013   Hyperlipidemia 05/20/2013   Migraine 02/23/2007   ALLERGIC RHINITIS 02/23/2007   TRACHEOBRONCHITIS 02/23/2007   COUGH 02/23/2007    PCP: Jerilynn Carnes  REFERRING PROVIDER: Onita Duos, MD  REFERRING DIAG:  Diagnosis  H53.2 (ICD-10-CM) - Diplopia  R26.9 (ICD-10-CM) - Gait abnormality  M54.2 (ICD-10-CM) - Neck pain  Pt want us  to concentrate on her cervical area   THERAPY DIAG:  Cervicalgia Decreased strength   Rationale for Evaluation and Treatment: Rehabilitation  ONSET DATE: chronic but progressive  SUBJECTIVE:                                                                                                                                                                                                         SUBJECTIVE STATEMENT: 05/12/23:  Neck pain is low today, pain scale 1/10 today.  Reports main difficulty with fatigue between her shoulders causing  ache.  Reports increased ease driving, continues to have  headaches daily.  Evaluation: Pt states that until September she would walk a trail that took her about about an hour she use to be able to do this in about 40 minutes but she just would give out.  By the end of the trail she would have to be physically holding her head up.  She has changed to a flat level walking now but she would prefer to get back to the trails. She is having more difficulty sweeping or racking leaves because her neck is down.     PERTINENT HISTORY:  Cervical fusion 2006  , myasthenia Gravis   PAIN:  Are you having pain? Yes: NPRS scale: 1/10; worst is a 7  Pain location: cervical spine and Lt scapula  Pain description: achy Aggravating factors: over the counter meds Relieving factors: tylenol   PRECAUTIONS: None     WEIGHT BEARING RESTRICTIONS: No  FALLS:  Has patient fallen in last 6 months? Yes. Number of falls 2x once because it was dark, second ankle twisted on an uneven surface.  States that she catches herself a lot.    LIVING ENVIRONMENT: Lives with: lives alone Lives in: House/apartment Stairs: Yes: Internal: 12 steps; on right going up and External: 2 steps; none Has following equipment at home: None  OCCUPATION: retired  PLOF: Independent  PATIENT GOALS: less pain, to increase her activity level  NEXT MD VISIT: after therapy  OBJECTIVE:  Note: Objective measures were completed at Evaluation unless otherwise noted.  DIAGNOSTIC FINDINGS:  IMPRESSION: 1. Anterior cervical fusion at C5-6 without foraminal or central canal stenosis. Osseous fusion of the posterior elements. 2. At C3-4 there is a mild disc bulge. Moderate bilateral facet arthropathy. Moderate-severe bilateral foraminal stenosis. 3. At C4-5 there is severe right facet arthropathy. Mild left facet arthropathy. Severe right foraminal stenosis. Mild left foraminal stenosis. Mild central canal stenosis. 4. At C6-7 there is right uncovertebral degenerative changes. No disc  protrusion. Moderate-severe right foraminal stenosis. Moderate left foraminal stenosis. 5. At C7-T1 there is a mild broad disc bulge. Moderate bilateral facet arthropathy. Mild bilateral foraminal stenosis.     Electronically Signed   By: Julaine Blanch M.D.   On: 03/02/2023 07:44   COGNITION: Overall cognitive status: Within functional limits for tasks assessed  SENSATION: Not tested  POSTURE: No Significant postural limitations  PALPATION: Severe mm tightness and spasm B    CERVICAL ROM:   Active ROM A/PROM (deg) eval  Flexion 45  Extension 34  Right lateral flexion 28  Left lateral flexion 24  Right rotation 27  Left rotation 27   (Blank rows = not tested)   Cervical strength: isometric:  all motions generally 3/5   UPPER EXTREMITY ROM: all wnl   TODAY'S TREATMENT:  DATE:  05/12/23: UBE backwards 4' Standing: GTB shoulder extensions 10x GTB rows 10x GTB scapular retractions 10x  Prone: Rows 1# 2x 10 Shoulder extension 10x Wback 10x  Manual STM to bilateral upper traps, levator and cervical spine paraspinals to decrease pain and cervical tightness  05/08/23 UBE dynamic warm up 2' forward and 2' backwards Seated: Cervical retractions with 3hold x 10 Thoracic extension over chair x 10 Upper trap stretch 3 x 20 each RTB scapular retractions 2 x 10 RTB shoulder extensions 2 x 10 Manual STM to bilateral upper traps, levator and cervical spine paraspinals to decrease pain and cervical tightness x 9'  05/01/23 Seated: Cervical and thoracic excursion x 3 Upper trap stretch 15 x 3 Retro shoulder circles x 10  W back with 2# x 10 X to V x 10  Isometric cervical for SB and extension 5 hold x 5 Postural exercises RTB Rows, scapular retraction and shoulder extension x 10  Manual supine to include traction, upper trap stretch,  efflurage/pettrisage 04/29/23 UBE 2'fwd, 2'bkwd level 1 Standing postural 3 RTB (rows, retraction, extension) 10X each  Corner stretch 3X30 Seated thoracic excursions 5X each direction with UE assist Manual seated to bil Ut, scap, scalenes  04/24/23: Seated:  3D Thoracic excursion all directions 5x  Isometric 5x 5 sidebend, rotation, extension Cervical retraction 10x 5  UT stretch 2x 30  Reviewed form with single arm doorway stretch   Manual supine Bil UT, scalenes, PROM, suboccipital release x2  PATIENT EDUCATION:  Education details: HEP/ the importance of sitting tall  Person educated: Patient Education method: Explanation, Verbal cues, and Handouts Education comprehension: returned demonstration  HOME EXERCISE PROGRAM: Access Code: [email or web address] URL: [email or web address] Date: 04/16/2023 Prepared by: Montie Metro  Exercises - Seated Cervical Extension AROM  - 2 x daily - 7 x weekly - 1 sets - 5-10 reps - 3-5 hold - Seated Cervical Sidebending AROM  - 2 x daily - 7 x weekly - 1 sets - 5-10 reps - 3-5 hold - Seated Neck Sidebending ROM  - 2 x daily - 7 x weekly - 1 sets - 5-10 reps - 3-5 hold - Seated Cervical Retraction Protraction AROM  - 2 x daily - 7 x weekly - 1 sets - 5-10 reps - 3-5 hold - Seated Scapular Retraction  - 2 x daily - 7 x weekly - 1 sets - 5-10 reps - 3-5 hold  04/22/23: doorway stretch 3X30 holds  04/24/23: - Single Arm Doorway Pec Stretch at 90 Degrees Abduction  - 2 x daily - 7 x weekly - 3 sets - 3 reps - 30 hold - Seated Isometric Cervical Rotation  - 2 x daily - 4-7 x weekly - 2 sets - 5 reps - 5 hold - Seated Isometric Cervical Sidebending  - 1 x daily - 7 x weekly - 3 sets - 10 reps - 5 hold - Seated Upper Trapezius Stretch  - 2 x daily - 7 x weekly - 1 sets - 3 reps - 30 hold  ASSESSMENT:  CLINICAL IMPRESSION:  Session focus with postural strengthening. Began prone exercises for scapular strengthening and given GTB  for increased resistance with HEP.  Pt tolerated well to session with no reoprts of increased pain.  EOS with manual STM to address cervical restrictions to improve mobility.   OBJECTIVE IMPAIRMENTS: decreased activity tolerance, decreased ROM, decreased strength, increased muscle spasms, impaired flexibility, and pain.   ACTIVITY LIMITATIONS: carrying, lifting, and locomotion level  PARTICIPATION LIMITATIONS: cleaning, laundry,  shopping, and community activity  PERSONAL FACTORS: Behavior pattern, Time since onset of injury/illness/exacerbation, and 1-2 comorbidities: myasthenia Gravis,migraines  are also affecting patient's functional outcome.   REHAB POTENTIAL: Good  CLINICAL DECISION MAKING: Evolving/moderate complexity  EVALUATION COMPLEXITY: Moderate   GOALS: Goals reviewed with patient? No  SHORT TERM GOALS: Target date: 05/06/23  Pt to be I in HEP in order to decrease her pain to no greater than a 4/10 Baseline:  Goal status: IN PROGRESS  2.  PT mm strength to be increased 1/2 grade to be able to complete 15 minutes to housework without difficulty  Baseline:  Goal status: IN PROGRESS  3.  PT to be able to rotate her head 10 degrees greater each direction for safer driving  Baseline:  Goal status: IN PROGRESS  4.  MM spasms to be min to mod  Baseline:  Goal status: IN PROGRESS  LONG TERM GOALS: Target date: 05/27/22  Pt to be I in HEP in order to decrease her pain to no greater than a 2/10 Baseline:  Goal status: IN PROGRESS  2.  PT mm strength to be increased 1 grade to be able to complete 30 minutes to housework without difficulty Baseline:  Goal status: IN PROGRESS  3.  PT to be able to rotate her head 20 degrees greater each direction for safer driving Baseline:  Goal status: IN PROGRESS  4.  MM spasms to be min  Baseline:  Goal status: IN PROGRESS   PLAN:  PT FREQUENCY: 2x/week  PT DURATION: 4 weeks  PLANNED INTERVENTIONS: 97110-Therapeutic  exercises, 97530- Therapeutic activity, [id number]- Neuromuscular re-education, 97535- Self Care, and [id number]- Manual therapy  PLAN FOR NEXT SESSION: continue manual to decrease spasms and improve pain, progress ROM and strength of cervical ; discuss follow up with massage therapy.  Augustin Mclean, LPTA/CLT; CBIS [phone]  Mclean Augustin Amble, PTA 05/13/2023, 3:50 PM  3:48 PM, 05/13/23

## 2023-05-14 HISTORY — PX: OTHER SURGICAL HISTORY: SHX169

## 2023-05-14 HISTORY — PX: CATARACT EXTRACTION: SUR2

## 2023-05-15 ENCOUNTER — Encounter (HOSPITAL_COMMUNITY): Payer: Self-pay

## 2023-05-15 ENCOUNTER — Ambulatory Visit (HOSPITAL_COMMUNITY): Payer: Medicare PPO | Attending: Neurology

## 2023-05-15 DIAGNOSIS — M6281 Muscle weakness (generalized): Secondary | ICD-10-CM | POA: Insufficient documentation

## 2023-05-15 DIAGNOSIS — M542 Cervicalgia: Secondary | ICD-10-CM | POA: Insufficient documentation

## 2023-05-15 NOTE — Therapy (Addendum)
 OUTPATIENT PHYSICAL THERAPY CERVICAL TREATMENT/DC   Patient Name: Mary [name] MRN: [id number] DOB:23-Sep-1950, 73 y.o., female Today's Date: 05/15/2023 PHYSICAL THERAPY DISCHARGE SUMMARY  Visits from Start of Care: 8  Current functional level related to goals / functional outcomes: See below   Remaining deficits: See below   Education / Equipment: HEP   Patient agrees to discharge. Patient goals were met. Patient is being discharged due to being pleased with the current functional level.  END OF SESSION:  PT End of Session - 05/15/23 1017     Visit Number 8    Number of Visits 8   Date for PT Re-Evaluation 05/28/23    Authorization Type humana    Authorization Time Period 12 visits 04/16/23-05/27/22    Authorization - Visit Number 8    Authorization - Number of Visits 12    Progress Note Due on Visit 10    PT Start Time 0935    PT Stop Time 1013    PT Time Calculation (min) 38 min    Activity Tolerance Patient tolerated treatment well    Behavior During Therapy WFL for tasks assessed/performed                 Past Medical History:  Diagnosis Date   Anemia    Arthritis    Chronic kidney disease    idiopathic micro hematuria    Complication of anesthesia    patient states does not take much medicine to put her to sleep    Edema    ankles to knees    GERD (gastroesophageal reflux disease)    Headache(784.0)    hx of migraines    Hyperlipidemia    Mitral valve prolapse    no symptoms    Myasthenia gravis (HCC)    PONV (postoperative nausea and vomiting)    Scalp alopecia    Seizures (HCC)    1 seizure 45 years ago; unknown etiology and no meds, no seizures since then.   Past Surgical History:  Procedure Laterality Date   BIOPSY  03/24/2020   Procedure: BIOPSY;  Surgeon: Cindie Carlin POUR, DO;  [facility]: [facility];  Service: Endoscopy;;  gastric duodenum   CERVICAL FUSION  2006   cervical 5-6    COLONOSCOPY N/A 03/03/2017   Procedure:  COLONOSCOPY;  Surgeon: Harvey Margo CROME, MD;  [facility]: [facility];  Service: Endoscopy;  Laterality: N/A;  1:15 pm   COLONOSCOPY WITH PROPOFOL  N/A 03/24/2020   Procedure: COLONOSCOPY WITH PROPOFOL ;  Surgeon: Cindie Carlin POUR, DO;  [facility]: [facility];  Service: Endoscopy;  Laterality: N/A;  10:00am   COLONOSCOPY WITH PROPOFOL  N/A 10/02/2020   Procedure: COLONOSCOPY WITH PROPOFOL ;  Surgeon: Cindie Carlin POUR, DO;  [facility];  Service: Endoscopy;  Laterality: N/A;  ASA II / 9:00   ESOPHAGOGASTRODUODENOSCOPY (EGD) WITH PROPOFOL  N/A 03/24/2020   Procedure: ESOPHAGOGASTRODUODENOSCOPY (EGD) WITH PROPOFOL ;  Surgeon: Cindie Carlin POUR, DO;  [facility];  Service: Endoscopy;  Laterality: N/A;   HEMORRHOID SURGERY N/A 06/23/2017   Procedure: ANOSCOPY WITH REMOVAL OF SINGLE LESION;  Surgeon: Mavis Anes, MD;  Location: [facility];  Service: General;  Laterality: N/A;   MYOCARDIAL PERFUSION STUDY  07/02/2011   NO SCINTIGRAPHIC EVIDENCE OF INDUCIBLE MYOCARDIAL ISCHEMIA. POST-STRESS EF IS 87%.EXERCISE CAPACITY 9 METS. EKG SHOWS NSR AT 69. THERE IS HORIZONTAL ST DEPRESSION WITH EXERCISE IN II, III, AVF SEEN BEST AT EARLY RECOVERY. THIS IS PRESENT IN V3-V5 AS WELL AND RETURNS TO  BASELINE AT THE END OF THE TEST. NORMAL STUDY.   POLYPECTOMY  03/24/2020   Procedure: POLYPECTOMY;  Surgeon: Cindie Carlin POUR, DO;  [facility];  Service: Endoscopy;;  colon    POLYPECTOMY  10/02/2020   Procedure: POLYPECTOMY;  Surgeon: Cindie Carlin POUR, DO;  [facility]: [facility];  Service: Endoscopy;;   RENAL ARTERY DOPPLER  12/28/2003   CELIAC ARTERY: AT REST, 168.4 CM/S; INSPRIATION, 177.6 CM/S. ARCUATE LIGAMENT COMPRESSION SYNDROME. R & L KIDNEYS: RIGHT KIDNEY MEASURES SOME WHAT SMALLER THAN LEFT. KIDNEY ARE ESSENTIALLY SYMMETRICAL IN SHAPE WITH NO OBVIOUS ABNORMALITY VISUALIZED. R & L RENAL ARTERIES: DEMONSTRATE NORMAL SPECTRA . NO SUGGESTION OF DIAMETER REDUCTION, DISSECTION,  FIBROMUSCULAR DYSPLASIA OR ANY OTHER VASCULAR ABNOR   TOTAL KNEE ARTHROPLASTY Right 08/31/2013   Procedure: RIGHT TOTAL KNEE ARTHROPLASTY;  Surgeon: Donnice JONETTA Car, MD;  Location: [facility];  Service: Orthopedics;  Laterality: Right;   TOTAL KNEE ARTHROPLASTY Left 03/07/2015   Procedure: TOTAL KNEE ARTHROPLASTY;  Surgeon: Donnice Car, MD;  Location: [facility];  Service: Orthopedics;  Laterality: Left;   TRANSTHORACIC ECHOCARDIOGRAM  07/02/2011   LV SYSTOLIC FUNCTION NORMAL, EF=>55%, INSIGNIFICANT PERICARDIAL EFFUSION, LEFT ATRIAL SIZE IS NORMAL, RV SYSTOLIC PRESSURE IS NORMAL. NO SIGN VALVULAR HEART DISEASE.   TUBAL LIGATION  1986   veins stripping      bilateral legs    Patient Active Problem List   Diagnosis Date Noted   Neck pain 03/13/2023   Gait abnormality 01/23/2023   Diplopia 01/23/2023   Gastritis 04/12/2020   Serrated polyp of colon 04/12/2020   Constipation 02/23/2020   Melena 02/23/2020   Esophageal reflux 02/23/2020   Condyloma acuminatum    Encounter for colonoscopy due to history of adenomatous colonic polyps    Overweight (BMI 25.0-29.9) 03/09/2015   S/P left TKA 03/07/2015   Palpitations 05/23/2014   Expected blood loss anemia 09/01/2013   S/P right TKA 08/31/2013   DJD (degenerative joint disease) of knee 08/31/2013   Pre-operative clearance, cardiac 08/17/2013   HTN (hypertension) 05/20/2013   Hyperlipidemia 05/20/2013   Migraine 02/23/2007   ALLERGIC RHINITIS 02/23/2007   TRACHEOBRONCHITIS 02/23/2007   COUGH 02/23/2007    PCP: Jerilynn Carnes  REFERRING PROVIDER: Onita Duos, MD  REFERRING DIAG:  Diagnosis  H53.2 (ICD-10-CM) - Diplopia  R26.9 (ICD-10-CM) - Gait abnormality  M54.2 (ICD-10-CM) - Neck pain  Pt want us  to concentrate on her cervical area   THERAPY DIAG:  Cervicalgia Decreased strength   Rationale for Evaluation and Treatment: Rehabilitation  ONSET DATE: chronic but progressive  SUBJECTIVE:  SUBJECTIVE STATEMENT: 05/15/23 Pt stated she feels she has made lots of gains so far, current pain scale 2/10.  Reports of decreased frequency and intensity with pain and continues to have headaches daily.  Reports improved flexibility with ability to rotate head while driving.    Evaluation: Pt states that until September she would walk a trail that took her about about an hour she use to be able to do this in about 40 minutes but she just would give out.  By the end of the trail she would have to be physically holding her head up.  She has changed to a flat level walking now but she would prefer to get back to the trails. She is having more difficulty sweeping or racking leaves because her neck is down.     PERTINENT HISTORY:  Cervical fusion 2006  , myasthenia Gravis   PAIN:  Are you having pain? Yes: NPRS scale: 1/10; worst is a 7  Pain location: cervical spine and Lt scapula  Pain description: achy Aggravating factors: over the counter meds Relieving factors: tylenol   PRECAUTIONS: None     WEIGHT BEARING RESTRICTIONS: No  FALLS:  Has patient fallen in last 6 months? Yes. Number of falls 2x once because it was dark, second ankle twisted on an uneven surface.  States that she catches herself a lot.    LIVING ENVIRONMENT: Lives with: lives alone Lives in: House/apartment Stairs: Yes: Internal: 12 steps; on right going up and External: 2 steps; none Has following equipment at home: None  OCCUPATION: retired  PLOF: Independent  PATIENT GOALS: less pain, to increase her activity level  NEXT MD VISIT: after therapy  OBJECTIVE:  Note: Objective measures were completed at Evaluation unless otherwise noted.  DIAGNOSTIC FINDINGS:  IMPRESSION: 1. Anterior cervical fusion at C5-6 without foraminal or central canal stenosis.  Osseous fusion of the posterior elements. 2. At C3-4 there is a mild disc bulge. Moderate bilateral facet arthropathy. Moderate-severe bilateral foraminal stenosis. 3. At C4-5 there is severe right facet arthropathy. Mild left facet arthropathy. Severe right foraminal stenosis. Mild left foraminal stenosis. Mild central canal stenosis. 4. At C6-7 there is right uncovertebral degenerative changes. No disc protrusion. Moderate-severe right foraminal stenosis. Moderate left foraminal stenosis. 5. At C7-T1 there is a mild broad disc bulge. Moderate bilateral facet arthropathy. Mild bilateral foraminal stenosis.     Electronically Signed   By: Julaine Blanch M.D.   On: 03/02/2023 07:44   COGNITION: Overall cognitive status: Within functional limits for tasks assessed  SENSATION: Not tested  POSTURE: No Significant postural limitations  PALPATION: Severe mm tightness and spasm B    CERVICAL ROM:   Active ROM A/PROM (deg) eval AROM  05/15/23  Flexion 45 50  Extension 34 50  Right lateral flexion 28 47  Left lateral flexion 24 45  Right rotation 27 55  Left rotation 27 52   (Blank rows = not tested)   Cervical strength: isometric:  all motions generally 3/5  Strength 05/15/23  Flexion 5  Extension 5  Right lateral flexion 4+  Left lateral flexion 4+  Right rotation 4+  Left rotation 4+    UPPER EXTREMITY ROM: all wnl   TODAY'S TREATMENT:  DATE:  05/15/23: UBE backwards 4' AROM  MMT Reviewed goals STM to cervical mm in supine position.  05/12/23: UBE backwards 4' Standing: GTB shoulder extensions 10x GTB rows 10x GTB scapular retractions 10x  Prone: Rows 1# 2x 10 Shoulder extension 10x Wback 10x  Manual STM to bilateral upper traps, levator and cervical spine paraspinals to decrease pain and cervical tightness  05/08/23 UBE dynamic  warm up 2' forward and 2' backwards Seated: Cervical retractions with 3hold x 10 Thoracic extension over chair x 10 Upper trap stretch 3 x 20 each RTB scapular retractions 2 x 10 RTB shoulder extensions 2 x 10 Manual STM to bilateral upper traps, levator and cervical spine paraspinals to decrease pain and cervical tightness x 9'  05/01/23 Seated: Cervical and thoracic excursion x 3 Upper trap stretch 15 x 3 Retro shoulder circles x 10  W back with 2# x 10 X to V x 10  Isometric cervical for SB and extension 5 hold x 5 Postural exercises RTB Rows, scapular retraction and shoulder extension x 10  Manual supine to include traction, upper trap stretch, efflurage/pettrisage 04/29/23 UBE 2'fwd, 2'bkwd level 1 Standing postural 3 RTB (rows, retraction, extension) 10X each  Corner stretch 3X30 Seated thoracic excursions 5X each direction with UE assist Manual seated to bil Ut, scap, scalenes  04/24/23: Seated:  3D Thoracic excursion all directions 5x  Isometric 5x 5 sidebend, rotation, extension Cervical retraction 10x 5  UT stretch 2x 30  Reviewed form with single arm doorway stretch   Manual supine Bil UT, scalenes, PROM, suboccipital release x2  PATIENT EDUCATION:  Education details: HEP/ the importance of sitting tall  Person educated: Patient Education method: Explanation, Verbal cues, and Handouts Education comprehension: returned demonstration  HOME EXERCISE PROGRAM: Access Code: [email or web address] URL: [email or web address] Date: 04/16/2023 Prepared by: Montie Metro  Exercises - Seated Cervical Extension AROM  - 2 x daily - 7 x weekly - 1 sets - 5-10 reps - 3-5 hold - Seated Cervical Sidebending AROM  - 2 x daily - 7 x weekly - 1 sets - 5-10 reps - 3-5 hold - Seated Neck Sidebending ROM  - 2 x daily - 7 x weekly - 1 sets - 5-10 reps - 3-5 hold - Seated Cervical Retraction Protraction AROM  - 2 x daily - 7 x weekly - 1 sets - 5-10 reps - 3-5  hold - Seated Scapular Retraction  - 2 x daily - 7 x weekly - 1 sets - 5-10 reps - 3-5 hold  04/22/23: doorway stretch 3X30 holds  04/24/23: - Single Arm Doorway Pec Stretch at 90 Degrees Abduction  - 2 x daily - 7 x weekly - 3 sets - 3 reps - 30 hold - Seated Isometric Cervical Rotation  - 2 x daily - 4-7 x weekly - 2 sets - 5 reps - 5 hold - Seated Isometric Cervical Sidebending  - 1 x daily - 7 x weekly - 3 sets - 10 reps - 5 hold - Seated Upper Trapezius Stretch  - 2 x daily - 7 x weekly - 1 sets - 3 reps - 30 hold  ASSESSMENT:  CLINICAL IMPRESSION: Pt thought last apt and wants to review goals as reports lots of improvements with mobility and pain.  Pt reports and able to demonstrate good mechanics with all HEP.  Vast improvements with cervical ROM and strength.  Reports ability to complete housework with no pain, does c/o fatigue with vacuuming.  STM complete with decreased to minimal  spasms.  Pt given list of local massage therapists.  DC to HEP.     OBJECTIVE IMPAIRMENTS: decreased activity tolerance, decreased ROM, decreased strength, increased muscle spasms, impaired flexibility, and pain.   ACTIVITY LIMITATIONS: carrying, lifting, and locomotion level  PARTICIPATION LIMITATIONS: cleaning, laundry, shopping, and community activity  PERSONAL FACTORS: Behavior pattern, Time since onset of injury/illness/exacerbation, and 1-2 comorbidities: myasthenia Gravis,migraines  are also affecting patient's functional outcome.   REHAB POTENTIAL: Good  CLINICAL DECISION MAKING: Evolving/moderate complexity  EVALUATION COMPLEXITY: Moderate   GOALS: Goals reviewed with patient? No  SHORT TERM GOALS: Target date: 05/06/23  Pt to be I in HEP in order to decrease her pain to no greater than a 4/10 Baseline: 05/15/23:  Reports compliance with HEP daily. Goal status: MET  2.  PT mm strength to be increased 1/2 grade to be able to complete 15 minutes to housework without difficulty   Baseline: Able to complete housework without difficulty for 15 minutes prior fatigue not pain.   Goal status: MET  3.  PT to be able to rotate her head 10 degrees greater each direction for safer driving  Baseline: see above Goal status: MET  4.  MM spasms to be min to mod  Baseline:  Goal status: MET  LONG TERM GOALS: Target date: 05/27/22  Pt to be I in HEP in order to decrease her pain to no greater than a 2/10 Baseline: 05/15/23/:  Reports compliance with advanced HEP Goal status: MET  2.  PT mm strength to be increased 1 grade to be able to complete 30 minutes to housework without difficulty Baseline: 05/15/23:  Strength goal met (see above);  Reports ability to complete housework without pain, does c/o increased fatigue with vacuuming (feels myasthenia Gravis related to fatigue). Goal status: Partially met  3.  PT to be able to rotate her head 20 degrees greater each direction for safer driving Baseline: 12/17/72:  See above Goal status: MET  4.  MM spasms to be min  Baseline:  Goal status: MET   PLAN:  PT FREQUENCY: 2x/week  PT DURATION: 4 weeks  PLANNED INTERVENTIONS: 97110-Therapeutic exercises, 97530- Therapeutic activity, [id number]- Neuromuscular re-education, 97535- Self Care, and [id number]- Manual therapy  PLAN FOR NEXT SESSION: DC to HEP.  Augustin Mclean, LPTA/CLT; CBIS [phone]  Montie Metro, PT CLT [phone] 05/15/2023, 10:37 AM  10:37 AM, 05/15/23

## 2023-05-21 ENCOUNTER — Ambulatory Visit (HOSPITAL_COMMUNITY)
Admission: RE | Admit: 2023-05-21 | Discharge: 2023-05-21 | Disposition: A | Discharge status: Home / Self Care | Payer: Medicare PPO | Source: Ambulatory Visit | Attending: Obstetrics and Gynecology | Admitting: Obstetrics and Gynecology

## 2023-05-21 DIAGNOSIS — Z1231 Encounter for screening mammogram for malignant neoplasm of breast: Secondary | ICD-10-CM | POA: Diagnosis not present

## 2023-05-22 ENCOUNTER — Inpatient Hospital Stay (HOSPITAL_COMMUNITY): Admission: RE | Admit: 2023-05-22 | Payer: Medicare PPO | Source: Ambulatory Visit

## 2023-05-22 ENCOUNTER — Ambulatory Visit (HOSPITAL_COMMUNITY): Payer: Medicare PPO

## 2023-05-26 DIAGNOSIS — R7309 Other abnormal glucose: Secondary | ICD-10-CM | POA: Diagnosis not present

## 2023-06-02 DIAGNOSIS — N1832 Chronic kidney disease, stage 3b: Secondary | ICD-10-CM | POA: Diagnosis not present

## 2023-06-02 DIAGNOSIS — I10 Essential (primary) hypertension: Secondary | ICD-10-CM | POA: Diagnosis not present

## 2023-06-03 ENCOUNTER — Other Ambulatory Visit (HOSPITAL_COMMUNITY): Payer: Self-pay | Admitting: Nephrology

## 2023-06-03 DIAGNOSIS — N1832 Chronic kidney disease, stage 3b: Secondary | ICD-10-CM

## 2023-06-05 DIAGNOSIS — N1832 Chronic kidney disease, stage 3b: Secondary | ICD-10-CM | POA: Diagnosis not present

## 2023-06-05 DIAGNOSIS — I10 Essential (primary) hypertension: Secondary | ICD-10-CM | POA: Diagnosis not present

## 2023-06-06 ENCOUNTER — Encounter: Payer: Self-pay | Admitting: Neurology

## 2023-06-09 ENCOUNTER — Ambulatory Visit (HOSPITAL_COMMUNITY)
Admission: RE | Admit: 2023-06-09 | Discharge: 2023-06-09 | Disposition: A | Discharge status: Home / Self Care | Payer: Medicare PPO | Source: Ambulatory Visit | Attending: Nephrology | Admitting: Nephrology

## 2023-06-09 DIAGNOSIS — N281 Cyst of kidney, acquired: Secondary | ICD-10-CM | POA: Diagnosis not present

## 2023-06-09 DIAGNOSIS — N1832 Chronic kidney disease, stage 3b: Secondary | ICD-10-CM | POA: Diagnosis not present

## 2023-06-09 DIAGNOSIS — N189 Chronic kidney disease, unspecified: Secondary | ICD-10-CM | POA: Diagnosis not present

## 2023-07-01 DIAGNOSIS — R809 Proteinuria, unspecified: Secondary | ICD-10-CM | POA: Diagnosis not present

## 2023-07-01 DIAGNOSIS — N1832 Chronic kidney disease, stage 3b: Secondary | ICD-10-CM | POA: Diagnosis not present

## 2023-07-01 DIAGNOSIS — I10 Essential (primary) hypertension: Secondary | ICD-10-CM | POA: Diagnosis not present

## 2023-07-03 DIAGNOSIS — L820 Inflamed seborrheic keratosis: Secondary | ICD-10-CM | POA: Diagnosis not present

## 2023-07-03 DIAGNOSIS — Z1283 Encounter for screening for malignant neoplasm of skin: Secondary | ICD-10-CM | POA: Diagnosis not present

## 2023-07-03 DIAGNOSIS — D225 Melanocytic nevi of trunk: Secondary | ICD-10-CM | POA: Diagnosis not present

## 2023-08-07 ENCOUNTER — Telehealth (INDEPENDENT_AMBULATORY_CARE_PROVIDER_SITE_OTHER): Payer: Self-pay | Admitting: Otolaryngology

## 2023-08-07 NOTE — Telephone Encounter (Signed)
 Atrovent 0.06% nasal spray was called into [place] with 5 refills.

## 2023-08-13 DIAGNOSIS — G43109 Migraine with aura, not intractable, without status migrainosus: Secondary | ICD-10-CM | POA: Diagnosis not present

## 2023-08-13 DIAGNOSIS — G7000 Myasthenia gravis without (acute) exacerbation: Secondary | ICD-10-CM | POA: Diagnosis not present

## 2023-08-13 DIAGNOSIS — H532 Diplopia: Secondary | ICD-10-CM | POA: Diagnosis not present

## 2023-08-14 ENCOUNTER — Encounter: Payer: Self-pay | Admitting: Cardiovascular Disease

## 2023-08-14 ENCOUNTER — Ambulatory Visit: Payer: Medicare PPO | Attending: Cardiovascular Disease | Admitting: Cardiovascular Disease

## 2023-08-14 DIAGNOSIS — I10 Essential (primary) hypertension: Secondary | ICD-10-CM

## 2023-08-14 DIAGNOSIS — Z8669 Personal history of other diseases of the nervous system and sense organs: Secondary | ICD-10-CM | POA: Diagnosis not present

## 2023-08-14 DIAGNOSIS — G7000 Myasthenia gravis without (acute) exacerbation: Secondary | ICD-10-CM

## 2023-08-14 DIAGNOSIS — Z8249 Family history of ischemic heart disease and other diseases of the circulatory system: Secondary | ICD-10-CM | POA: Diagnosis not present

## 2023-08-14 DIAGNOSIS — E7800 Pure hypercholesterolemia, unspecified: Secondary | ICD-10-CM

## 2023-08-14 DIAGNOSIS — K219 Gastro-esophageal reflux disease without esophagitis: Secondary | ICD-10-CM

## 2023-08-14 NOTE — Patient Instructions (Signed)
 Medication Instructions:  No procedures were ordered during today's visit.  *If you need a refill on your cardiac medications before your next appointment, please call your pharmacy*   Lab Work: No labs were ordered during today's visit.  If you have labs (blood work) drawn today and your tests are completely normal, you will receive your results only by: MyChart Message (if you have MyChart) OR A paper copy in the mail If you have any lab test that is abnormal or we need to change your treatment, we will call you to review the results.   Testing/Procedures: No procedures were ordered during today's visit.    Follow-Up: At [facility], you and your health needs are our priority.  As part of our continuing mission to provide you with exceptional heart care, we have created designated Provider Care Teams.  These Care Teams include your primary Cardiologist (physician) and Advanced Practice Providers (APPs -  Physician Assistants and Nurse Practitioners) who all work together to provide you with the care you need, when you need it.  We recommend signing up for the patient portal called "MyChart".  Sign up information is provided on this After Visit Summary.  MyChart is used to connect with patients for Virtual Visits (Telemedicine).  Patients are able to view lab/test results, encounter notes, upcoming appointments, etc.  Non-urgent messages can be sent to your provider as well.   To learn more about what you can do with MyChart, go to [email or web address].    Your next appointment:   1 year  Provider:   Luane School, MD    Other Instructions Thank you for choosing [facility]!

## 2023-08-14 NOTE — Progress Notes (Signed)
 Patient ID: YAMEL BALE, female   DOB: Nov 25, 1950, 73 y.o.   MRN: [id number]      HPI: Mary [name] is a 73 y.o. female presents to the office for a 13 month cardiology evaluation.  Mary [name] has a long-standing history of migraine headaches,  hypertension, hyperlipidemia, GERD, and has strong family history for both coronary as well as peripheral vascular disease. She also has PVD with lower extremity venous abnormalities. Remotely, she had been on pravastatin for hyperlipidemia but this was switched to Crestor do to inadequate response to pravastatin.   When I saw her over one year ago, she was stable on amlodipine 10 mg, benazepril 10 mg for hypetension as well as her migraine headaches and Crestor 40 mg for hyperlipidemia.   She had developed a rash and stopped taking Toprol-XL 12.5 mg.  She denied any palpitations.  Following its discontinuance or awareness of blood pressure elevation.  Over the past year, she has been bothered by chronic bronchitis and allergic cough.  She also underwent left knee replacement in October 2016 after previously undergoing right knee replacement in April 2015.  She has seen Dr. Sherwood Gambler in July and apparently complete set of blood work was done and she was told that these were within normal limits.  She denies any chest pain.  She is unaware of recurrent palpitations.  She denies presyncope or syncope.  She completed 3 courses of antibiotics.  She will be having a follow-up ENT evaluation later this week.  A chest x-ray on 02/12/2016 did not reveal any active cardiopulmonary disease.   She had developed a dry hacking cough and also was having swelling wall on amlodipine/benazepril combination 10/10.  Her neurologist who takes care of her migraines ultimately switched her to candesartan and she is now on 32 mg daily.  She believes her migraine frequency has increased with the change of medication.  Previously when she was on Lotrel.  She would experience 4  migraines per year and since February 2018 had experienced 15, although less intense than previously.  When I saw her, I added back amlodipine at 2.5 mg.  This has resulted in significant reduction in her migraines  She had undergone blood work by Dr. Sherwood Gambler in 2018.  Her cholesterol was 174 with an LDL of 92 and triglycerides of 67.  When I saw her in October 2018, I added Zetia 10 mg to her rosuvastatin 40 mg in attempt to reach an LDL less than 70.    Subsequent blood work has shown benefit and when I last saw her in February 2019  recent LDL 1 week previously was 67.  HbA1c will A1c was minimally increased at 5.8.  These were normal.  She underwent outpatient surgery last week for resection of a small anal wort  She tolerated this well.    I saw her in January 2020.  Prior to that evaluation she had been doing  fairly well but follow-up some episodes of fatigability and chest heaviness in October which led to an evaluation by Bettina Gavia, PAC.  An echo Doppler study on February 17, 2018 showed an EF of 60 to 65%.  She had normal wall motion.  Nuclear perfusion study showed a small defect was felt most likely due to breast attenuation rather than ischemia.  He was low risk study.  Nuclear stress ejection fraction was 67%.  Subsequently, she has kept a log of episodes of chest heaviness.  These typically are nonexertional.  She also  has been monitoring her blood pressure which consistently has been stable.  She brought this log with her to the office  which I reviewed.    She was evaluated by Edd Fabian, NP in August 2020.  Her blood pressure was 138/72 but further improved at home.  She was having ankle swelling and amlodipine was reduced to 2.5 mg.  She continued to be on candesartan 32 mg.  She continues to be on Zetia, rosuvastatin 40 mg, coenzyme every 10 and omega fatty acids.  I saw her on Oct 05, 2019.  Since her last evaluation, at time she had noted some chest wall, musculoskeletal rib discomfort  to her chest.  She is able to walk at least 8000 steps daily and denies any exertional symptomatology.  Her cousin had recently died with abnormality to her pleura.  She she has a strong family history for CAD and PAD.  She denies palpitations, presyncope or syncope.  I suspect that her chest pain was musculoskeletal in etiology.  With her strong family history and recent cousin who died I recommended she undergo coronary CTA for assessment of coronary obstructive disease and hopefully allay some of her concerns for significant CAD.  She underwent coronary CTA on November 09, 2019 which demonstrated a calcium score of 0.  There was no evidence for coronary plaque.  Since I saw her, she was evaluated by Edd Fabian in July 2021 and more recently by Randall An, PA-C in March 2022.    I saw her on March 05, 2021 at which time she continued to feel well and denied any recurrent chest discomfort.  She was exercising regularly and doing water aerobics on Monday Wednesday and Friday and walking a thTrail 1.6 miles 6 days/week.  She has issues with migraines and has been on Emgality injection once a month and is followed by Dr. Wandra Arthurs her neurologist.  I last saw her on July 31, 2022 at which time she continued to do well.  She was exercising regularly and doing water aerobics 3 times per week and walking at least 6 days/week on her [place].  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  She continues to be on candesartan 8 mg which she states is more for migraines and hypertension.  She also is on Emgality.  Her migraine headaches have occurred less frequently.  He is on Zetia 10 mg and rosuvastatin 40 mg for hyperlipidemia.  She is no longer taking montelukast.    Since I last saw her, she continues to remain asymptomatic.  She continues to exercise with water aerobics and walking daily.  She was diagnosed with myasthenia gravis 1 year ago at [facility] and sees Dr.Szatmary and locally is followed  by Dr. Terrace Arabia of neurology.  She continues to be on candesartan as well as Emgality and sumatriptan for her migraine headaches.  She is on Zetia 10 mg for hyperlipidemia in addition to rosuvastatin 40 mg.  She takes omeprazole for GERD and Linzess for bowel issues.  She presents for evaluation.  Past Medical History:  Diagnosis Date   Anemia    Arthritis    Chronic kidney disease    idiopathic micro hematuria    Complication of anesthesia    patient states does not take much medicine to put her to sleep    Edema    ankles to knees    GERD (gastroesophageal reflux disease)    Headache(784.0)    hx of migraines  Hyperlipidemia    Mitral valve prolapse    no symptoms    Myasthenia gravis (HCC)    PONV (postoperative nausea and vomiting)    Scalp alopecia    Seizures (HCC)    1 seizure 45 years ago; unknown etiology and no meds, no seizures since then.    Past Surgical History:  Procedure Laterality Date   BIOPSY  03/24/2020   Procedure: BIOPSY;  Surgeon: Lanelle Bal, DO;  [facility];  Service: Endoscopy;;  gastric duodenum   CERVICAL FUSION  2006   cervical 5-6    COLONOSCOPY N/A 03/03/2017   Procedure: COLONOSCOPY;  Surgeon: West Bali, MD;  [facility]: [facility];  Service: Endoscopy;  Laterality: N/A;  1:15 pm   COLONOSCOPY WITH PROPOFOL N/A 03/24/2020   Procedure: COLONOSCOPY WITH PROPOFOL;  Surgeon: Lanelle Bal, DO;  [facility];  Service: Endoscopy;  Laterality: N/A;  10:00am   COLONOSCOPY WITH PROPOFOL N/A 10/02/2020   Procedure: COLONOSCOPY WITH PROPOFOL;  Surgeon: Lanelle Bal, DO;  [facility];  Service: Endoscopy;  Laterality: N/A;  ASA II / 9:00   ESOPHAGOGASTRODUODENOSCOPY (EGD) WITH PROPOFOL N/A 03/24/2020   Procedure: ESOPHAGOGASTRODUODENOSCOPY (EGD) WITH PROPOFOL;  Surgeon: Lanelle Bal, DO;  [facility];  Service: Endoscopy;  Laterality: N/A;   HEMORRHOID SURGERY N/A 06/23/2017    Procedure: ANOSCOPY WITH REMOVAL OF SINGLE LESION;  Surgeon: Franky Macho, MD;  Location: [facility];  Service: General;  Laterality: N/A;   MYOCARDIAL PERFUSION STUDY  07/02/2011   NO SCINTIGRAPHIC EVIDENCE OF INDUCIBLE MYOCARDIAL ISCHEMIA. POST-STRESS EF IS 87%.EXERCISE CAPACITY 9 METS. EKG SHOWS NSR AT 69. THERE IS HORIZONTAL ST DEPRESSION WITH EXERCISE IN II, III, AVF SEEN BEST AT EARLY RECOVERY. THIS IS PRESENT IN V3-V5 AS WELL AND RETURNS TO BASELINE AT THE END OF THE TEST. NORMAL STUDY.   POLYPECTOMY  03/24/2020   Procedure: POLYPECTOMY;  Surgeon: Lanelle Bal, DO;  [facility];  Service: Endoscopy;;  colon    POLYPECTOMY  10/02/2020   Procedure: POLYPECTOMY;  Surgeon: Lanelle Bal, DO;  [facility];  Service: Endoscopy;;   RENAL ARTERY DOPPLER  12/28/2003   CELIAC ARTERY: AT REST, 168.4 CM/S; INSPRIATION, 177.6 CM/S. ARCUATE LIGAMENT COMPRESSION SYNDROME. R & L KIDNEYS: RIGHT KIDNEY MEASURES SOME WHAT SMALLER THAN LEFT. KIDNEY ARE ESSENTIALLY SYMMETRICAL IN SHAPE WITH NO OBVIOUS ABNORMALITY VISUALIZED. R & L RENAL ARTERIES: DEMONSTRATE NORMAL SPECTRA. NO SUGGESTION OF DIAMETER REDUCTION, DISSECTION, FIBROMUSCULAR DYSPLASIA OR ANY OTHER VASCULAR ABNOR   TOTAL KNEE ARTHROPLASTY Right 08/31/2013   Procedure: RIGHT TOTAL KNEE ARTHROPLASTY;  Surgeon: Shelda Pal, MD;  Location: [facility];  Service: Orthopedics;  Laterality: Right;   TOTAL KNEE ARTHROPLASTY Left 03/07/2015   Procedure: TOTAL KNEE ARTHROPLASTY;  Surgeon: Durene Romans, MD;  Location: [facility];  Service: Orthopedics;  Laterality: Left;   TRANSTHORACIC ECHOCARDIOGRAM  07/02/2011   LV SYSTOLIC FUNCTION NORMAL, EF=>55%, INSIGNIFICANT PERICARDIAL EFFUSION, LEFT ATRIAL SIZE IS NORMAL, RV SYSTOLIC PRESSURE IS NORMAL. NO SIGN VALVULAR HEART DISEASE.   TUBAL LIGATION  1986   veins stripping      bilateral legs     Allergies  Allergen Reactions   Itraconazole Other (See Comments) and Swelling    Bilateral  knee and ankle swelling and pain  Other Reaction(s): Not available   Amlodipine    Amlodipine Besy-Benazepril Hcl Cough   Nsaids Other (See Comments)    elevated-creatinine levels  Other Reaction(s):  Not available   Metoprolol Rash    Other Reaction(s): Not available    Current Outpatient Medications  Medication Sig Dispense Refill   acetaminophen (TYLENOL) 500 MG tablet Take 500 mg by mouth every 8 (eight) hours as needed for headache.     Calcium Citrate 250 MG TABS Take 500 mg by mouth 3 (three) times daily.      candesartan (ATACAND) 8 MG tablet Take 8 mg by mouth daily.     Coenzyme Q10 200 MG capsule Take 200 mg by mouth daily. ubiquinol     docusate sodium (COLACE) 100 MG capsule Take 200 mg by mouth daily.     EMGALITY 120 MG/ML SOAJ Inject 120 mg into the skin every 30 (thirty) days.      ezetimibe (ZETIA) 10 MG tablet TAKE 1 TABLET BY MOUTH ONCE A DAY. 90 tablet 1   Ferrous Sulfate (IRON) 325 (65 Fe) MG TABS Take 325 mg by mouth at bedtime.     fexofenadine (ALLEGRA) 180 MG tablet Take 180 mg by mouth daily.     ipratropium (ATROVENT) 0.06 % nasal spray Place 2 sprays into both nostrils daily.     linaclotide (LINZESS) 72 MCG capsule TAKE 1 CAPSULE BY MOUTH ONCE DAILY BEFORE BREAKFAST. 90 capsule 5   Magnesium Gluconate 250 MG TABS Take 250 mg by mouth daily at 12 noon.      Multiple Vitamin (MULTIVITAMIN WITH MINERALS) TABS tablet Take 1 tablet by mouth daily. Centrum Silver     MYRBETRIQ 50 MG TB24 tablet Take 50 mg by mouth daily.     omeprazole (PRILOSEC) 40 MG capsule TAKE (1) CAPSULE BY MOUTH ONCE DAILY. 30 capsule 5   OVER THE COUNTER MEDICATION Take 4 capsules by mouth in the morning and at bedtime. kidney stuff Natural     Polyethyl Glycol-Propyl Glycol (SYSTANE OP) Place 1 drop into both eyes 2 (two) times daily.     pyridOXINE (VITAMIN B-6) 100 MG tablet Take 100 mg by mouth daily.     rosuvastatin (CRESTOR) 40 MG tablet TAKE 1 TABLET BY MOUTH AT BEDTIME. 90  tablet 3   SUMAtriptan (IMITREX) 100 MG tablet Take 100 mg by mouth as needed for migraine. May repeat in 2 hours if headache persists or recurs.     SUMAtriptan Succinate Refill 4 MG/0.5ML SOCT Inject 4 mg into the skin as needed (for migraines).     No current facility-administered medications for this visit.    Social History   Socioeconomic History   Marital status: Widowed    Spouse name: Not on file   Number of children: Not on file   Years of education: Not on file   Highest education level: Not on file  Occupational History   Not on file  Tobacco Use   Smoking status: Former    Current packs/day: 0.00    Average packs/day: 2.0 packs/day for 22.0 years (44.0 ttl pk-yrs)    Types: Cigarettes    Start date: 05/13/1966    Quit date: 05/13/1988    Years since quitting: 35.2   Smokeless tobacco: Never  Vaping Use   Vaping status: Never Used  Substance and Sexual Activity   Alcohol use: Yes    Comment: wine/beer; couple times/month   Drug use: No   Sexual activity: Not Currently    Birth control/protection: Post-menopausal  Other Topics Concern   Not on file  Social History Narrative   Not on file   Social Drivers of Health  Financial Resource Strain: Not on file  Food Insecurity: Not on file  Transportation Needs: Not on file  Physical Activity: Not on file  Stress: Not on file  Social Connections: Not on file  Intimate Partner Violence: Not on file   Social history is notable that she is married and has remained active. She does water aerobics and also rides a bicycle. She does not smoke, but there is a remote history of tobacco use having quit approximately 27 years ago. She has 2 children. She is the sister-in-law of Dr. Illene Regulus and is married to Dr. Alvera Novel wife's brother.  She is retired Pension scheme manager and retired at age 7.  Family History  Problem Relation Age of Onset   Dementia Mother    Heart attack Father    Heart disease Father     Kidney failure Father    Heart attack Brother        age 40   Cancer - Other Brother        kidney   Colon cancer Paternal Aunt    ROS General: Negative; No fevers, chills, or night sweats;  HEENT: Negative; No changes in vision or hearing, sinus congestion, difficulty swallowing Pulmonary: Negative; No cough, wheezing, shortness of breath, hemoptysis Cardiovascular: See history of present illness GI: Negative; No nausea, vomiting, diarrhea, or abdominal pain GU: Colonoscopy October 2018, status post outpatient surgery for an small anal wart Musculoskeletal: Negative; no myalgias, joint pain, or weakness Hematologic/Oncology: Negative; no easy bruising, bleeding Endocrine: Negative; no heat/cold intolerance; no diabetes Neuro: Positive for history of migraines, currently treated with Emgality with good benefit but she had experienced 3 mild episodes over the past month Skin: Negative; No rashes or skin lesions Psychiatric: Negative; No behavioral problems, depression Sleep: Negative; No snoring, daytime sleepiness, hypersomnolence, bruxism, restless legs, hypnogognic hallucinations, no cataplexy Other comprehensive 14 point system review is negative.   PE BP 114/74   Pulse 62   Ht 5' 4.75" (1.645 m)   Wt 147 lb 12.8 oz (67 kg)   SpO2 99%   BMI 24.79 kg/m    Repeat blood pressure by me was 122/76  Wt Readings from Last 3 Encounters:  08/14/23 147 lb 12.8 oz (67 kg)  01/23/23 152 lb 8 oz (69.2 kg)  07/31/22 156 lb 12.8 oz (71.1 kg)   General: Alert, oriented, no distress.  Skin: normal turgor, no rashes, warm and dry HEENT: Normocephalic, atraumatic. Pupils equal round and reactive to light; sclera anicteric; extraocular muscles intact;  Nose without nasal septal hypertrophy Mouth/Parynx benign; Mallinpatti scale 2 Neck: No JVD, no carotid bruits; normal carotid upstroke Lungs: clear to ausculatation and percussion; no wheezing or rales Chest wall: without tenderness to  palpitation Heart: PMI not displaced, RRR, s1 s2 normal, 1/6 systolic murmur, no diastolic murmur, no rubs, gallops, thrills, or heaves Abdomen: soft, nontender; no hepatosplenomehaly, BS+; abdominal aorta nontender and not dilated by palpation. Back: no CVA tenderness Pulses 2+ Musculoskeletal: full range of motion, normal strength, no joint deformities Extremities: no clubbing cyanosis or edema, Homan's sign negative  Neurologic: grossly nonfocal; Cranial nerves grossly wnl Psychologic: Normal mood and affect    EKG Interpretation Date/Time:  Thursday August 14 2023 10:16:38 EDT Ventricular Rate:  62 PR Interval:  180 QRS Duration:  76 QT Interval:  388 QTC Calculation: 393 R Axis:   48  Text Interpretation: Normal sinus rhythm with sinus arrhythmia Low voltage QRS Nonspecific ST and T wave abnormality When compared with ECG of  14-Sep-2004 13:53, Nonspecific T wave abnormality now evident in Anterior leads Confirmed by Nicki Guadalajara ([id number]) on 08/14/2023 12:18:00 PM     July 31, 2022 ECG (independently read by me): NSR at 64, LAE, nonspecific T wave abnormality     March 05, 2021 ECG (independently read by me):  NSR at 61, no ectopy,   May 2021 ECG (independently read by me): Normal sinus rhythm with mild sinus arrhythmia at 67 bpm.  Normal intervals.  No ectopy no ST segment changes  January 2020 ECG (independently read by me): Sinus rhythm with PACs with ventricular rate of 65 bpm.  No ectopy.  February 2019 ECG (independently read by me): Normal sinus rhythm with PAC.  PR interval 172 ms.  Nonspecific ST changes.  QTc interval 379 ms.  October 2018 ECG (independently read by me): Normal sinus rhythm at 69 bpm.  Nonspecific ST changes.  PR interval 156 more seconds, QTc interval 390 ms.  October 2017 ECG (independently read by me): Normal sinus rhythm at 64 bpm.  July 2016 ECG (independently read by me):  Normal sinus rhythm at 72 bpm. Borderline left atrial  enlargement.  January 2016 ECG (independently read by me): Normal sinus rhythm at 69 bpm.  Normal intervals.  No significant ST segment changes.  January 2015 ECG (independently read by me): Normal sinus rhythm with nonspecific ST-T changes. Heart rate 75 beats per minute.  LABS:  I personally reviewed the repeat laboratory done on 01/28/2017 by Dr. Sherwood Gambler.  Lipid studies from February 04, 2018 reveal a total cholesterol 132, HDL 55, LDL 60, triglycerides 87.        Latest Ref Rng & Units 11/04/2019    9:31 AM 07/01/2017    9:32 AM 06/19/2017    2:31 PM  BMP  Glucose 65 - 99 mg/dL 784  93  696   BUN 8 - 27 mg/dL 22  22  27    Creatinine 0.57 - 1.00 mg/dL 2.95  2.84  1.32   BUN/Creat Ratio 12 - 28 18  19     Sodium 134 - 144 mmol/L 141  141  135   Potassium 3.5 - 5.2 mmol/L 4.5  4.8  4.7   Chloride 96 - 106 mmol/L 107  107  102   CO2 20 - 29 mmol/L 22  20  23    Calcium 8.7 - 10.3 mg/dL 9.3  9.7  9.4       Latest Ref Rng & Units 07/01/2017    9:32 AM 12/29/2014    8:52 AM 05/10/2014    8:45 AM  Hepatic Function  Total Protein 6.0 - 8.5 g/dL 6.9  6.8  6.9   Albumin 3.6 - 4.8 g/dL 4.3  3.9  4.1   AST 0 - 40 IU/L 23  19  19    ALT 0 - 32 IU/L 16  17  17    Alk Phosphatase 39 - 117 IU/L 63  73  69   Total Bilirubin 0.0 - 1.2 mg/dL 0.4  0.4  0.4       Latest Ref Rng & Units 01/21/2020   10:37 AM 06/19/2017    2:31 PM 03/03/2017   10:19 AM  CBC  WBC 3.8 - 10.8 Thousand/uL 5.5  5.6  4.5   Hemoglobin 11.7 - 15.5 g/dL 44.0  10.2  72.5   Hematocrit 35.0 - 45.0 % 40.7  37.0  33.4   Platelets 140 - 400 Thousand/uL 204  234  221    Lab Results  Component Value Date   MCV 91.9 01/21/2020   MCV 91.6 06/19/2017   MCV 91.5 03/03/2017   Lab Results  Component Value Date   TSH 2.014 12/29/2014   Lipid Panel     Component Value Date/Time   CHOL 136 07/01/2017 0932   TRIG 49 07/01/2017 0932   HDL 59 07/01/2017 0932   CHOLHDL 2.3 07/01/2017 0932   CHOLHDL 2.3 12/29/2014 0852    VLDL 11 12/29/2014 0852   LDLCALC 67 07/01/2017 0932     RADIOLOGY: No results found.  IMPRESSION:  1. Essential hypertension   2. Hx of migraine headaches   3. Pure hypercholesterolemia   4. Family history of heart disease   5. Gastroesophageal reflux disease without esophagitis   6. Myasthenia gravis [facility])     ASSESSMENT AND PLAN: Mary [name] is a 73 year-old female who has a long-standing history of hypertension as well as hyperlipidemia.  She developed a nonproductive cough on Lotrel, which led to discontinuance of ACE inhibition.  She had been on a regimen consisting of low-dose amlodipine as well as candesartan.  She is on rosuvastatin 40 mg and Zetia for hyperlipidemia.  She is no longer taking amlodipine but has continued to take candesartan 8 mg which she states is also helpful for her migraine headaches.  In addition, she is also on Emgality in addition to sumatriptan for her migraines.  She had experienced some atypical chest pain in the past which most likely was musculoskeletal in etiology.  A coronary CTA showed a calcium score of 0.  She continues to exercise regularly and does water aerobics and walks to [employer] 6 days/week.  Her blood pressure today is stable and on repeat by me was 122/76.  She has developed myasthenia gravis 1 year ago which is followed by Dr. Drucilla Chalet at [facility] and she also sees Dr. Terrace Arabia in neurology in [place].  She is followed by Dr. Sherwood Gambler.  Laboratory in January 2025 showed total cholesterol 157 HDL 56 LDL 84 and triglycerides 89.  Creatinine was 1.39.  She continues to take omeprazole for GERD.  I discussed with her my plans for upcoming retirement.  Since she lives in the [place] area I will transition her to our cardiologist in [place] and for her to see either Dr. Dina Rich or Dr. Luane School.    Lennette Bihari, MD, [facility]  08/17/2023 5:37 PM

## 2023-08-30 ENCOUNTER — Other Ambulatory Visit: Payer: Self-pay | Admitting: Cardiovascular Disease

## 2023-09-05 DIAGNOSIS — G43109 Migraine with aura, not intractable, without status migrainosus: Secondary | ICD-10-CM | POA: Diagnosis not present

## 2023-09-05 DIAGNOSIS — H532 Diplopia: Secondary | ICD-10-CM | POA: Diagnosis not present

## 2023-09-05 DIAGNOSIS — G7000 Myasthenia gravis without (acute) exacerbation: Secondary | ICD-10-CM | POA: Diagnosis not present

## 2023-09-05 DIAGNOSIS — Z0389 Encounter for observation for other suspected diseases and conditions ruled out: Secondary | ICD-10-CM | POA: Diagnosis not present

## 2023-09-24 ENCOUNTER — Encounter: Payer: Self-pay | Admitting: *Deleted

## 2023-10-09 ENCOUNTER — Encounter: Payer: Self-pay | Admitting: Internal Medicine

## 2023-10-16 ENCOUNTER — Telehealth: Payer: Self-pay

## 2023-10-16 NOTE — Telephone Encounter (Signed)
 Who is your primary care physician: DR. Kathyleen Parkins  Reasons for the colonoscopy: HX COLON POLYPS  Have you had a colonoscopy before?  YES 10/02/20 DR.CARVER  Do you have family history of colon cancer? NO  Previous colonoscopy with polyps removed? YES  Do you have a history colorectal cancer?   NO  Are you diabetic? If yes, Type 1 or Type 2?    NO  Do you have a prosthetic or mechanical heart valve? NO  Do you have a pacemaker/defibrillator?   NO  Have you had endocarditis/atrial fibrillation? NO  Have you had joint replacement within the last 12 months?  NO  Do you tend to be constipated or have to use laxatives? YES  Do you have any history of drugs or alchohol?  NO  Do you use supplemental oxygen?  NO  Have you had a stroke or heart attack within the last 6 months? NO  Do you take weight loss medication?  NO  For female patients: have you had a hysterectomy?  NO                                     are you post menopausal?       YES                                            do you still have your menstrual cycle? NO  LAST 1987      Do you take any blood-thinning medications such as: (aspirin , warfarin, Plavix, Aggrenox)  NO  If yes we need the name, milligram, dosage and who is prescribing doctor  Current Outpatient Medications on File Prior to Visit  Medication Sig Dispense Refill   acetaminophen  (TYLENOL ) 500 MG tablet Take 500 mg by mouth every 8 (eight) hours as needed for headache.     Calcium  Citrate 250 MG TABS Take 500 mg by mouth 3 (three) times daily.      candesartan  (ATACAND ) 8 MG tablet Take 8 mg by mouth daily.     Coenzyme Q10 200 MG capsule Take 200 mg by mouth daily. ubiquinol     docusate sodium  (COLACE) 100 MG capsule Take 200 mg by mouth daily.     EMGALITY 120 MG/ML SOAJ Inject 120 mg into the skin every 30 (thirty) days.      ezetimibe  (ZETIA ) 10 MG tablet TAKE 1 TABLET BY MOUTH ONCE A DAY. 90 tablet 3   Ferrous Sulfate  (IRON) 325 (65 Fe) MG  TABS Take 325 mg by mouth at bedtime.     fexofenadine (ALLEGRA) 180 MG tablet Take 180 mg by mouth daily.     ipratropium (ATROVENT) 0.06 % nasal spray Place 2 sprays into both nostrils daily.     linaclotide  (LINZESS ) 72 MCG capsule TAKE 1 CAPSULE BY MOUTH ONCE DAILY BEFORE BREAKFAST. 90 capsule 5   Magnesium  Gluconate 250 MG TABS Take 250 mg by mouth daily at 12 noon.      Multiple Vitamin (MULTIVITAMIN WITH MINERALS) TABS tablet Take 1 tablet by mouth daily. Centrum Silver     MYRBETRIQ 50 MG TB24 tablet Take 50 mg by mouth daily.     omeprazole  (PRILOSEC) 40 MG capsule TAKE (1) CAPSULE BY MOUTH ONCE DAILY. 30 capsule 5   OVER THE COUNTER MEDICATION Take  4 capsules by mouth in the morning and at bedtime. kidney stuff Natural     Polyethyl Glycol-Propyl Glycol (SYSTANE OP) Place 1 drop into both eyes 2 (two) times daily.     pyridOXINE (VITAMIN B-6) 100 MG tablet Take 100 mg by mouth daily.     rosuvastatin  (CRESTOR ) 40 MG tablet TAKE 1 TABLET BY MOUTH AT BEDTIME. 90 tablet 3   SUMAtriptan  (IMITREX ) 100 MG tablet Take 100 mg by mouth as needed for migraine. May repeat in 2 hours if headache persists or recurs.     SUMAtriptan  Succinate Refill 4 MG/0.5ML SOCT Inject 4 mg into the skin as needed (for migraines).     No current facility-administered medications on file prior to visit.    Allergies  Allergen Reactions   Itraconazole Other (See Comments) and Swelling    Bilateral knee and ankle swelling and pain  Other Reaction(s): Not available   Amlodipine     Amlodipine  Besy-Benazepril  Hcl Cough   Nsaids Other (See Comments)    elevated-creatinine levels  Other Reaction(s): Not available   Metoprolol  Rash    Other Reaction(s): Not available     Pharmacy: [facility]  Primary Insurance Name: [employer] MEDICARE [id number]  Best number where you can be reached: [phone]

## 2023-10-16 NOTE — Telephone Encounter (Signed)
 ASA 2. -Needs to continue Linzess  2 mcg daily -Needs traditional large-volume prep given she only had fair clearance with Suprep in the past. -1.5 days of clear liquids prior. -Hold iron for 7 days prior to procedure

## 2023-10-16 NOTE — Telephone Encounter (Signed)
 Pt states she is in [place] right now but will be back home beginning of next week. Give her a call back

## 2023-10-21 DIAGNOSIS — R809 Proteinuria, unspecified: Secondary | ICD-10-CM | POA: Diagnosis not present

## 2023-10-21 DIAGNOSIS — N1832 Chronic kidney disease, stage 3b: Secondary | ICD-10-CM | POA: Diagnosis not present

## 2023-10-21 DIAGNOSIS — I10 Essential (primary) hypertension: Secondary | ICD-10-CM | POA: Diagnosis not present

## 2023-10-23 NOTE — Telephone Encounter (Signed)
 Pt states she has an appointment to see Dr.carver on 7/23. Advised pt that we will schedule her then.

## 2023-10-24 DIAGNOSIS — G629 Polyneuropathy, unspecified: Secondary | ICD-10-CM | POA: Diagnosis not present

## 2023-10-24 DIAGNOSIS — G7000 Myasthenia gravis without (acute) exacerbation: Secondary | ICD-10-CM | POA: Diagnosis not present

## 2023-10-28 DIAGNOSIS — R809 Proteinuria, unspecified: Secondary | ICD-10-CM | POA: Diagnosis not present

## 2023-10-28 DIAGNOSIS — I10 Essential (primary) hypertension: Secondary | ICD-10-CM | POA: Diagnosis not present

## 2023-10-28 DIAGNOSIS — N1832 Chronic kidney disease, stage 3b: Secondary | ICD-10-CM | POA: Diagnosis not present

## 2023-11-06 ENCOUNTER — Other Ambulatory Visit: Payer: Self-pay

## 2023-11-06 ENCOUNTER — Ambulatory Visit (HOSPITAL_COMMUNITY): Attending: Psychiatry

## 2023-11-06 ENCOUNTER — Encounter (HOSPITAL_COMMUNITY): Payer: Self-pay

## 2023-11-06 DIAGNOSIS — R2689 Other abnormalities of gait and mobility: Secondary | ICD-10-CM | POA: Diagnosis not present

## 2023-11-06 DIAGNOSIS — R29898 Other symptoms and signs involving the musculoskeletal system: Secondary | ICD-10-CM | POA: Diagnosis not present

## 2023-11-06 DIAGNOSIS — Z7409 Other reduced mobility: Secondary | ICD-10-CM | POA: Diagnosis not present

## 2023-11-06 NOTE — Therapy (Signed)
 OUTPATIENT PHYSICAL THERAPY THORACOLUMBAR EVALUATION   Patient Name: Mary [name] MRN: [id number] DOB:17-May-1950, 73 y.o., female Today's Date: 11/06/2023  END OF SESSION:  PT End of Session - 11/06/23 1103     Visit Number 1    Number of Visits 12    Date for PT Re-Evaluation 12/04/23    Authorization Type [employer] medicare    Authorization Time Period Auth requested    PT Start Time 1105    PT Stop Time 1145    PT Time Calculation (min) 40 min    Activity Tolerance Patient tolerated treatment well    Behavior During Therapy WFL for tasks assessed/performed          Past Medical History:  Diagnosis Date   Anemia    Arthritis    Chronic kidney disease    idiopathic micro hematuria    Complication of anesthesia    patient states does not take much medicine to put her to sleep    Edema    ankles to knees    GERD (gastroesophageal reflux disease)    Headache(784.0)    hx of migraines    Hyperlipidemia    Mitral valve prolapse    no symptoms    Myasthenia gravis (HCC)    PONV (postoperative nausea and vomiting)    Scalp alopecia    Seizures (HCC)    1 seizure 45 years ago; unknown etiology and no meds, no seizures since then.   Past Surgical History:  Procedure Laterality Date   BIOPSY  03/24/2020   Procedure: BIOPSY;  Surgeon: Cindie Carlin POUR, DO;  [facility];  Service: Endoscopy;;  gastric duodenum   CERVICAL FUSION  2006   cervical 5-6    COLONOSCOPY N/A 03/03/2017   Procedure: COLONOSCOPY;  Surgeon: Harvey Margo CROME, MD;  [facility];  Service: Endoscopy;  Laterality: N/A;  1:15 pm   COLONOSCOPY WITH PROPOFOL  N/A 03/24/2020   Procedure: COLONOSCOPY WITH PROPOFOL ;  Surgeon: Cindie Carlin POUR, DO;  [facility];  Service: Endoscopy;  Laterality: N/A;  10:00am   COLONOSCOPY WITH PROPOFOL  N/A 10/02/2020   Procedure: COLONOSCOPY WITH PROPOFOL ;  Surgeon: Cindie Carlin POUR, DO;  [facility];  Service: Endoscopy;   Laterality: N/A;  ASA II / 9:00   ESOPHAGOGASTRODUODENOSCOPY (EGD) WITH PROPOFOL  N/A 03/24/2020   Procedure: ESOPHAGOGASTRODUODENOSCOPY (EGD) WITH PROPOFOL ;  Surgeon: Cindie Carlin POUR, DO;  [facility];  Service: Endoscopy;  Laterality: N/A;   HEMORRHOID SURGERY N/A 06/23/2017   Procedure: ANOSCOPY WITH REMOVAL OF SINGLE LESION;  Surgeon: Mavis Anes, MD;  Location: [facility];  Service: General;  Laterality: N/A;   MYOCARDIAL PERFUSION STUDY  07/02/2011   NO SCINTIGRAPHIC EVIDENCE OF INDUCIBLE MYOCARDIAL ISCHEMIA. POST-STRESS EF IS 87%.EXERCISE CAPACITY 9 METS. EKG SHOWS NSR AT 69. THERE IS HORIZONTAL ST DEPRESSION WITH EXERCISE IN II, III, AVF SEEN BEST AT EARLY RECOVERY. THIS IS PRESENT IN V3-V5 AS WELL AND RETURNS TO BASELINE AT THE END OF THE TEST. NORMAL STUDY.   POLYPECTOMY  03/24/2020   Procedure: POLYPECTOMY;  Surgeon: Cindie Carlin POUR, DO;  [facility];  Service: Endoscopy;;  colon    POLYPECTOMY  10/02/2020   Procedure: POLYPECTOMY;  Surgeon: Cindie Carlin POUR, DO;  [facility];  Service: Endoscopy;;   RENAL ARTERY DOPPLER  12/28/2003   CELIAC ARTERY: AT REST, 168.4 CM/S; INSPRIATION, 177.6 CM/S. ARCUATE LIGAMENT COMPRESSION SYNDROME. R & L KIDNEYS: RIGHT KIDNEY MEASURES SOME WHAT SMALLER  THAN LEFT. KIDNEY ARE ESSENTIALLY SYMMETRICAL IN SHAPE WITH NO OBVIOUS ABNORMALITY VISUALIZED. R & L RENAL ARTERIES: DEMONSTRATE NORMAL SPECTRA . NO SUGGESTION OF DIAMETER REDUCTION, DISSECTION, FIBROMUSCULAR DYSPLASIA OR ANY OTHER VASCULAR ABNOR   TOTAL KNEE ARTHROPLASTY Right 08/31/2013   Procedure: RIGHT TOTAL KNEE ARTHROPLASTY;  Surgeon: Donnice JONETTA Car, MD;  Location: [facility];  Service: Orthopedics;  Laterality: Right;   TOTAL KNEE ARTHROPLASTY Left 03/07/2015   Procedure: TOTAL KNEE ARTHROPLASTY;  Surgeon: Donnice Car, MD;  Location: [facility];  Service: Orthopedics;  Laterality: Left;   TRANSTHORACIC ECHOCARDIOGRAM  07/02/2011   LV SYSTOLIC FUNCTION NORMAL, EF=>55%,  INSIGNIFICANT PERICARDIAL EFFUSION, LEFT ATRIAL SIZE IS NORMAL, RV SYSTOLIC PRESSURE IS NORMAL. NO SIGN VALVULAR HEART DISEASE.   TUBAL LIGATION  1986   veins stripping      bilateral legs    Patient Active Problem List   Diagnosis Date Noted   Neck pain 03/13/2023   Gait abnormality 01/23/2023   Diplopia 01/23/2023   Gastritis 04/12/2020   Serrated polyp of colon 04/12/2020   Constipation 02/23/2020   Melena 02/23/2020   Esophageal reflux 02/23/2020   Condyloma acuminatum    Encounter for colonoscopy due to history of adenomatous colonic polyps    Overweight (BMI 25.0-29.9) 03/09/2015   S/P left TKA 03/07/2015   Palpitations 05/23/2014   Expected blood loss anemia 09/01/2013   S/P right TKA 08/31/2013   DJD (degenerative joint disease) of knee 08/31/2013   Pre-operative clearance, cardiac 08/17/2013   HTN (hypertension) 05/20/2013   Hyperlipidemia 05/20/2013   Migraine 02/23/2007   ALLERGIC RHINITIS 02/23/2007   TRACHEOBRONCHITIS 02/23/2007   COUGH 02/23/2007    PCP: Bertell Satterfield, MD  REFERRING PROVIDER: Charmel Ivan Hamming, MD  REFERRING DIAG: G62.9 (ICD-10-CM) - Polyneuropathy, unspecified  Rationale for Evaluation and Treatment: Rehabilitation  THERAPY DIAG:  Impairment of balance  Leg weakness, bilateral  Impaired functional mobility, balance, gait, and endurance  ONSET DATE: 4-5 Months   SUBJECTIVE:                                                                                                                                                                                           SUBJECTIVE STATEMENT: Patient reports she's noticed she has tinging in feet, toes, that travels up to knees at times. Reports it makes her feel wobbly at times. Reports she has tried massage to her feet and LE and reports it may be helping some but not much. Harder to maintain balance in dark when she gets up in the middle of the night.   PERTINENT HISTORY:  Myastenia  Gravis 2 TKR  PAIN:  Are you having  pain? Yes: NPRS scale: 2/10 foot, 3/10 headache (constant) Pain location: Bottom of R foot, headache/neck pain Pain description: Achy, N/T Aggravating factors: Walking  Relieving factors: Rest  PRECAUTIONS: None  RED FLAGS: Neuropathy    WEIGHT BEARING RESTRICTIONS: No  FALLS:  Has patient fallen in last 6 months? Yes. Number of falls 1. A lot of near falls. On uneven surfaces.   LIVING ENVIRONMENT: Lives with: lives alone Stairs: Yes: Internal: 12 steps; on right going up and External: 6 steps; can reach both Has following equipment at home: None Has RW and SPC  OCCUPATION: Retired, Pension scheme manager  PLOF: Independent  PATIENT GOALS: To increase stability and see if we can figure out how to get Neuropathy pain from getting worse  NEXT MD VISIT: October 17th, 2025  OBJECTIVE:  Note: Objective measures were completed at Evaluation unless otherwise noted.  DIAGNOSTIC FINDINGS:    PATIENT SURVEYS:  Total ABC score: 1300 / 1600 = 81.3 %  COGNITION: Overall cognitive status: Within functional limits for tasks assessed     SENSATION: WFL   POSTURE: rounded shoulders, forward head, and increased thoracic kyphosis  PALPATION:   LUMBAR ROM:   AROM eval  Flexion   Extension   Right lateral flexion   Left lateral flexion   Right rotation   Left rotation    (Blank rows = not tested)  LOWER EXTREMITY ROM:     Active  Right eval Left eval  Hip flexion    Hip extension    Hip abduction    Hip adduction    Hip internal rotation    Hip external rotation    Knee flexion    Knee extension    Ankle dorsiflexion    Ankle plantarflexion    Ankle inversion    Ankle eversion     (Blank rows = not tested)  LOWER EXTREMITY MMT:    MMT Right eval Left eval  Hip flexion 4- 4-  Hip extension 3+ 3+  Hip abduction 4- 4-  Hip adduction    Hip internal rotation    Hip external rotation    Knee flexion 3+ 3+   Knee extension 4- 4-  Ankle dorsiflexion 5 5  Ankle plantarflexion    Ankle inversion    Ankle eversion     (Blank rows = not tested)  LUMBAR SPECIAL TESTS:    FUNCTIONAL TESTS:  4 Stage Balance: Side by side: 30 Semi-tandem: 30 ea LE leading Tandem:  30 ea LE leading  SLS: L- 22 R- 11 GAIT: Distance walked: 76' Assistive device utilized: None Level of assistance: Complete Independence Comments: Mildly unsteady at times  TREATMENT DATE:  11/06/23: PT Eval and HEP                                                                                                                                 PATIENT EDUCATION:  Education details: PT evaluation, objective findings, POC,  Importance of HEP, Precautions, Clinic policies  Person educated: Patient Education method: Explanation and Demonstration Education comprehension: verbalized understanding and returned demonstration  HOME EXERCISE PROGRAM: Access Code: [email or web address] URL: [email or web address] Date: 11/06/2023 Prepared by: Rosaria Powell-Butler  Exercises - Sit to Stand  - 2 x daily - 7 x weekly - 3 sets - 10 reps - Standing Hip Extension with Counter Support  - 2 x daily - 7 x weekly - 3 sets - 10 reps - Side Stepping with Counter Support  - 2 x daily - 7 x weekly - 3 sets - 10 reps - Standing Tandem Balance with Counter Support  - 2 x daily - 7 x weekly - 3 sets - 30 hold - Standing Single Leg Stance with Counter Support  - 2 x daily - 7 x weekly - 3 sets - 30 hold  ASSESSMENT:  CLINICAL IMPRESSION: Patient is a 73 y.o. female who was seen today for physical therapy evaluation and treatment for G62.9 (ICD-10-CM) - Polyneuropathy, unspecified. Patient arrives to PT with relevant history of Myastenia Gravis and new polyneuropathy/peripheral neuropathy diagnosis. Patient reports her biggest issue this date is balance and endurance. On this date, patient demonstrates decreased LE strength, and deficits with  static balance.  Patient educated on taking anticipatory breaks when performing her usual daily tasks to aid in decreasing chances of falls due to fatigue. Patient will benefit from skilled physical therapy in order to address strength, balance, and endurance to improve function/QOL.    OBJECTIVE IMPAIRMENTS: decreased activity tolerance, decreased balance, decreased endurance, difficulty walking, and decreased strength.   ACTIVITY LIMITATIONS: lifting, standing, squatting, stairs, transfers, and reach over head  PARTICIPATION LIMITATIONS: meal prep, cleaning, laundry, community activity, and yard work  PERSONAL FACTORS: N/A are also affecting patient's functional outcome.   REHAB POTENTIAL: Good  CLINICAL DECISION MAKING: Stable/uncomplicated  EVALUATION COMPLEXITY: Low   GOALS: Goals reviewed with patient? No  SHORT TERM GOALS: Target date: 11/20/23 Patient will be independent with performance of HEP to demonstrate adequate self management of symptoms.  Baseline:  Goal status: INITIAL  2.   Patient will report at least a 25% improvement with function or pain overall since beginning PT. Baseline:  Goal status: INITIAL  LONG TERM GOALS: Target date: 12/18/23  Patient will improve ABC Scale by at least 10.5 points in order to demonstrate improved self-perceived function while meeting MCID (based on MCID for MS patients) Baseline:  Goal status: INITIAL  2.  Patient will maintain single leg balance for 30 seconds bilaterally in order to demonstrate safety with single leg balancing tasks such as stair navigation.  Baseline:  Goal status: INITIAL  3.  Patient will report 0 near falls within a weeks time in order to demonstrate improved risk of falls.  Baseline:  Goal status: INITIAL 4. Patient will report at least a 50% improvement with function or pain overall since beginning PT. Baseline:  Goal status: INITIAL  PLAN:  PT FREQUENCY: 2x/week  PT DURATION: 6 weeks  PLANNED  INTERVENTIONS: 97164- PT Re-evaluation, 97110-Therapeutic exercises, 97530- Therapeutic activity, [id number]- Neuromuscular re-education, 97535- Self Care, 02859- Manual therapy, [id number]- Gait training, [phone]- Electrical stimulation (manual), [id number]- Traction (mechanical), [phone] (1-2 muscles), 20561 (3+ muscles)- Dry Needling, Patient/Family education, Balance training, Stair training, Taping, Joint mobilization, Spinal mobilization, Cryotherapy, and Moist heat.  PLAN FOR NEXT SESSION: Review HEP and goals, TUG, Test, FGA or DGI, progress LE strength and balance *Use RPE for fatigue rating due to Myastenia Gravis diag   12:53  PM, 11/06/23 Rosaria Settler, PT, DPT [facility] Auth Request  Referring diagnosis code (ICD 10)? G62.9 (ICD-10-CM) - Polyneuropathy, unspecified Treatment diagnosis codes (ICD 10)? (if different than referring diagnosis)  R26.89 R29.898 Z74.09  What was this (referring dx) caused by? []  Surgery []  Fall [x]  Ongoing issue []  Arthritis []  Other: ____________  Laterality: []  Rt []  Lt [x]  Both  Deficits: []  Pain []  Stiffness [x]  Weakness []  Edema [x]  Balance Deficits [x]  Coordination [x]  Gait Disturbance []  ROM []  Other   Functional Tool Score:  Total ABC score: 1300 / 1600 = 81.3 %  4 stage balance test: Side by side: 30 Semi-tandem: 30 ea LE leading Tandem:  30 ea LE leading  SLS: L- 22 R- 11  CPT codes: See Planned Interventions listed in the Plan section of the Evaluation.

## 2023-11-09 ENCOUNTER — Other Ambulatory Visit: Payer: Self-pay | Admitting: Gastroenterology

## 2023-11-11 ENCOUNTER — Encounter (HOSPITAL_COMMUNITY): Payer: Self-pay

## 2023-11-11 ENCOUNTER — Encounter (HOSPITAL_COMMUNITY)

## 2023-11-11 NOTE — Therapy (Signed)
 [facility] [address] [place], [place], [address] Phone: [phone]   Fax:  [phone]  Patient Details  Name: Mary [name] MRN: [id number] Date of Birth: [birth year] Referring Provider:  No ref. provider found  Encounter Date: 11/11/2023  Pt was called concerning her missed appointment today at 4:00PM. Pt states she was unaware she had an appointment today and was aware of her next appointment this Thursday, we will look forward to seeing her then.  Lang Ada, PT, DPT [facility] Office: [phone] 4:41 PM, 11/11/23   [facility] [address] [place], [place], [address] Phone: [phone]   Fax:  [phone]

## 2023-11-12 ENCOUNTER — Ambulatory Visit: Admitting: Internal Medicine

## 2023-11-12 ENCOUNTER — Encounter: Payer: Self-pay | Admitting: Internal Medicine

## 2023-11-12 VITALS — BP 102/61 | HR 64 | Temp 98.7°F | Ht 64.0 in | Wt 147.7 lb

## 2023-11-12 DIAGNOSIS — K5904 Chronic idiopathic constipation: Secondary | ICD-10-CM

## 2023-11-12 DIAGNOSIS — K219 Gastro-esophageal reflux disease without esophagitis: Secondary | ICD-10-CM

## 2023-11-12 DIAGNOSIS — Z860101 Personal history of adenomatous and serrated colon polyps: Secondary | ICD-10-CM | POA: Diagnosis not present

## 2023-11-12 MED ORDER — OMEPRAZOLE 40 MG PO CPDR: 40.0000 mg | DELAYED_RELEASE_CAPSULE | Freq: Every day | ORAL | 3 refills | Status: DC

## 2023-11-12 NOTE — Progress Notes (Unsigned)
 Referring Provider: Bertell Satterfield, MD Primary Care Physician:  Bertell Satterfield, MD Primary GI:  Dr. Cindie  Chief Complaint  Patient presents with   Follow-up    Patient here today for a follow up.patient says over the last several months she has had issues with constipation. The stools are skinny, and she does not feel it is a lot when she does have a bm. She says she has notice the stools being dark and tarry.     HPI:   Mary [name] is a 73 y.o. female who presents to clinic today for follow-up visit.  Last seen 2023.  Chronic constipation: Currently taking Linzess  72 mcg daily, symptoms not ideally controlled.  Notes stools are skinny, sometimes small balls.  Reports occasional straining.  No rectal bleeding.  Chronic GERD: Relatively well-controlled on omeprazole .  Denies any dysphagia odynophagia.  No epigastric or chest pain.  EGD November 2021: -Gastritis. Biopsied.  Reactive gastropathy.  No H. pylori. - Normal duodenal bulb, first portion of the duodenum and second portion of the duodenum. Biopsied.  Peptic duodenitis  History of adenomatous colon polyps: Due for surveillance colonoscopy now.  Colonoscopy May 2022: - Preparation of the colon was fair. - Non-bleeding internal hemorrhoids. - One 2 mm polyp in the cecum, removed with a cold biopsy forceps. Resected and retrieved.  Tubular adenoma. - Post-polypectomy scar in the transverse colon. - Stool in the ascending colon and in the cecum. -Next colonoscopy in 3 years.   Colonoscopy November 2021: -Two 2 to 3 mm polyps in the cecum, removed with a cold snare. Resected and retrieved.  Tubular adenoma. - One 30 mm polyp in the transverse colon, removed with mucosal resection. Resected and retrieved.  Multiple fragments of sessile serrated polyp. - One 12 mm polyp in the transverse colon, removed with mucosal resection. Resected and retrieved.  Multiple fragments of tubular adenoma. - Mucosal resection was  performed. Resection and retrieval were complete. - Mucosal resection was performed. Resection and retrieval were complete.    Past Medical History:  Diagnosis Date   Anemia    Arthritis    Chronic kidney disease    idiopathic micro hematuria    Complication of anesthesia    patient states does not take much medicine to put her to sleep    Edema    ankles to knees    GERD (gastroesophageal reflux disease)    Headache(784.0)    hx of migraines    Hyperlipidemia    Mitral valve prolapse    no symptoms    Myasthenia gravis (HCC)    PONV (postoperative nausea and vomiting)    Scalp alopecia    Seizures (HCC)    1 seizure 45 years ago; unknown etiology and no meds, no seizures since then.    Past Surgical History:  Procedure Laterality Date   BIOPSY  03/24/2020   Procedure: BIOPSY;  Surgeon: Cindie Carlin POUR, DO;  Location: [facility];  Service: Endoscopy;;  gastric duodenum   CERVICAL FUSION  2006   cervical 5-6    COLONOSCOPY N/A 03/03/2017   Procedure: COLONOSCOPY;  Surgeon: Harvey Margo CROME, MD;  Location: [facility];  Service: Endoscopy;  Laterality: N/A;  1:15 pm   COLONOSCOPY WITH PROPOFOL  N/A 03/24/2020   Procedure: COLONOSCOPY WITH PROPOFOL ;  Surgeon: Cindie Carlin POUR, DO;  Location: [facility];  Service: Endoscopy;  Laterality: N/A;  10:00am   COLONOSCOPY WITH PROPOFOL  N/A 10/02/2020   Procedure: COLONOSCOPY WITH PROPOFOL ;  Surgeon: Cindie Carlin POUR, DO;  Location: [facility];  Service: Endoscopy;  Laterality: N/A;  ASA II / 9:00   ESOPHAGOGASTRODUODENOSCOPY (EGD) WITH PROPOFOL  N/A 03/24/2020   Procedure: ESOPHAGOGASTRODUODENOSCOPY (EGD) WITH PROPOFOL ;  Surgeon: Cindie Carlin POUR, DO;  Location: [facility];  Service: Endoscopy;  Laterality: N/A;   HEMORRHOID SURGERY N/A 06/23/2017   Procedure: ANOSCOPY WITH REMOVAL OF SINGLE LESION;  Surgeon: Mavis Anes, MD;  Location: [facility];  Service: General;  Laterality: N/A;   MYOCARDIAL PERFUSION STUDY   07/02/2011   NO SCINTIGRAPHIC EVIDENCE OF INDUCIBLE MYOCARDIAL ISCHEMIA. POST-STRESS EF IS 87%.EXERCISE CAPACITY 9 METS. EKG SHOWS NSR AT 69. THERE IS HORIZONTAL ST DEPRESSION WITH EXERCISE IN II, III, AVF SEEN BEST AT EARLY RECOVERY. THIS IS PRESENT IN V3-V5 AS WELL AND RETURNS TO BASELINE AT THE END OF THE TEST. NORMAL STUDY.   POLYPECTOMY  03/24/2020   Procedure: POLYPECTOMY;  Surgeon: Cindie Carlin POUR, DO;  Location: [facility];  Service: Endoscopy;;  colon    POLYPECTOMY  10/02/2020   Procedure: POLYPECTOMY;  Surgeon: Cindie Carlin POUR, DO;  Location: [facility];  Service: Endoscopy;;   RENAL ARTERY DOPPLER  12/28/2003   CELIAC ARTERY: AT REST, 168.4 CM/S; INSPRIATION, 177.6 CM/S. ARCUATE LIGAMENT COMPRESSION SYNDROME. R & L KIDNEYS: RIGHT KIDNEY MEASURES SOME WHAT SMALLER THAN LEFT. KIDNEY ARE ESSENTIALLY SYMMETRICAL IN SHAPE WITH NO OBVIOUS ABNORMALITY VISUALIZED. R & L RENAL ARTERIES: DEMONSTRATE NORMAL SPECTRA . NO SUGGESTION OF DIAMETER REDUCTION, DISSECTION, FIBROMUSCULAR DYSPLASIA OR ANY OTHER VASCULAR ABNOR   TOTAL KNEE ARTHROPLASTY Right 08/31/2013   Procedure: RIGHT TOTAL KNEE ARTHROPLASTY;  Surgeon: Donnice JONETTA Car, MD;  Location: [facility];  Service: Orthopedics;  Laterality: Right;   TOTAL KNEE ARTHROPLASTY Left 03/07/2015   Procedure: TOTAL KNEE ARTHROPLASTY;  Surgeon: Donnice Car, MD;  Location: [facility];  Service: Orthopedics;  Laterality: Left;   TRANSTHORACIC ECHOCARDIOGRAM  07/02/2011   LV SYSTOLIC FUNCTION NORMAL, EF=>55%, INSIGNIFICANT PERICARDIAL EFFUSION, LEFT ATRIAL SIZE IS NORMAL, RV SYSTOLIC PRESSURE IS NORMAL. NO SIGN VALVULAR HEART DISEASE.   TUBAL LIGATION  1986   veins stripping      bilateral legs     Current Outpatient Medications  Medication Sig Dispense Refill   docusate sodium  (COLACE) 100 MG capsule Take 200 mg by mouth daily.     EMGALITY 120 MG/ML SOAJ Inject 120 mg into the skin every 30 (thirty) days.      ezetimibe  (ZETIA ) 10 MG tablet TAKE 1  TABLET BY MOUTH ONCE A DAY. 90 tablet 3   Ferrous Sulfate  (IRON) 325 (65 Fe) MG TABS Take 325 mg by mouth at bedtime.     fexofenadine (ALLEGRA) 180 MG tablet Take 180 mg by mouth daily.     ipratropium (ATROVENT) 0.06 % nasal spray Place 2 sprays into both nostrils daily.     linaclotide  (LINZESS ) 72 MCG capsule TAKE 1 CAPSULE BY MOUTH ONCE DAILY BEFORE BREAKFAST. 90 capsule 5   Magnesium  Gluconate 250 MG TABS Take 250 mg by mouth daily at 12 noon.      Multiple Vitamin (MULTIVITAMIN WITH MINERALS) TABS tablet Take 1 tablet by mouth daily. Centrum Silver     MYRBETRIQ 50 MG TB24 tablet Take 50 mg by mouth daily.     omeprazole  (PRILOSEC) 40 MG capsule TAKE (1) CAPSULE BY MOUTH ONCE DAILY. 30 capsule 5   OVER THE COUNTER MEDICATION Take 4 capsules by mouth in the morning and at bedtime. kidney stuff Natural     Polyethyl  Glycol-Propyl Glycol (SYSTANE OP) Place 1 drop into both eyes 2 (two) times daily.     pyridOXINE (VITAMIN B-6) 100 MG tablet Take 100 mg by mouth daily.     rosuvastatin  (CRESTOR ) 40 MG tablet TAKE 1 TABLET BY MOUTH AT BEDTIME. 90 tablet 3   SUMAtriptan  (IMITREX ) 100 MG tablet Take 100 mg by mouth as needed for migraine. May repeat in 2 hours if headache persists or recurs.     SUMAtriptan  Succinate Refill 4 MG/0.5ML SOCT Inject 4 mg into the skin as needed (for migraines).     No current facility-administered medications for this visit.    Allergies as of 11/12/2023 - Review Complete 11/12/2023  Allergen Reaction Noted   Itraconazole Other (See Comments) and Swelling 05/20/2013   Amlodipine   08/12/2022   Amlodipine  besy-benazepril  hcl Cough 05/13/2012   Nsaids Other (See Comments) 09/25/2020   Metoprolol  Rash 02/28/2015    Family History  Problem Relation Age of Onset   Dementia Mother    Heart attack Father    Heart disease Father    Kidney failure Father    Heart attack Brother        age 44   Cancer - Other Brother        kidney   Colon cancer Paternal Aunt      Social History   Socioeconomic History   Marital status: Widowed    Spouse name: Not on file   Number of children: Not on file   Years of education: Not on file   Highest education level: Not on file  Occupational History   Not on file  Tobacco Use   Smoking status: Former    Current packs/day: 0.00    Average packs/day: 2.0 packs/day for 22.0 years (44.0 ttl pk-yrs)    Types: Cigarettes    Start date: 05/13/1966    Quit date: 05/13/1988    Years since quitting: 35.5   Smokeless tobacco: Never  Vaping Use   Vaping status: Never Used  Substance and Sexual Activity   Alcohol use: Yes    Comment: wine/beer; couple times/month   Drug use: No   Sexual activity: Not Currently    Birth control/protection: Post-menopausal  Other Topics Concern   Not on file  Social History Narrative   Not on file   Social Drivers of Health   Financial Resource Strain: Not on file  Food Insecurity: Not on file  Transportation Needs: Not on file  Physical Activity: Not on file  Stress: Not on file  Social Connections: Not on file    Subjective: Review of Systems  Constitutional:  Negative for chills and fever.  HENT:  Negative for congestion and hearing loss.   Eyes:  Negative for blurred vision and double vision.  Respiratory:  Negative for cough and shortness of breath.   Cardiovascular:  Negative for chest pain and palpitations.  Gastrointestinal:  Positive for constipation and heartburn. Negative for abdominal pain, blood in stool, diarrhea, melena and vomiting.  Genitourinary:  Negative for dysuria and urgency.  Musculoskeletal:  Negative for joint pain and myalgias.  Skin:  Negative for itching and rash.  Neurological:  Negative for dizziness and headaches.  Psychiatric/Behavioral:  Negative for depression. The patient is not nervous/anxious.      Objective: BP 102/61 (BP Location: Left Arm, Patient Position: Sitting, Cuff Size: Normal)   Pulse 64   Temp 98.7 F (37.1 C)  (Temporal)   Ht 5' 4 (1.626 m)   Wt 147 lb 11.2  oz (67 kg)   BMI 25.35 kg/m  Physical Exam Constitutional:      Appearance: Normal appearance.  HENT:     Head: Normocephalic and atraumatic.  Eyes:     Extraocular Movements: Extraocular movements intact.     Conjunctiva/sclera: Conjunctivae normal.  Cardiovascular:     Rate and Rhythm: Normal rate and regular rhythm.  Pulmonary:     Effort: Pulmonary effort is normal.     Breath sounds: Normal breath sounds.  Abdominal:     General: Bowel sounds are normal.     Palpations: Abdomen is soft.  Musculoskeletal:        General: No swelling. Normal range of motion.     Cervical back: Normal range of motion and neck supple.  Skin:    General: Skin is warm and dry.     Coloration: Skin is not jaundiced.  Neurological:     General: No focal deficit present.     Mental Status: She is alert and oriented to person, place, and time.  Psychiatric:        Mood and Affect: Mood normal.        Behavior: Behavior normal.      Assessment/Plan:  1.  Chronic GERD-well-controlled on omeprazole .  Refill sent in today.  2.  Chronic idiopathic constipation-symptoms improved though not ideally controlled on Linzess  72 mcg daily.  Will give samples of the 145 mcg daily and see how she does.  Call with update in 1 week and I will change her prescription if improved.  3.  Personal history of adenomatous and serrated polyps-we will schedule for surveillance colonoscopy today. The risks including infection, bleed, or perforation as well as benefits, limitations, alternatives and imponderables have been reviewed with the patient. Questions have been answered. All parties agreeable.  Follow-up after colonoscopy. 11/12/2023 3:08 PM   Disclaimer: This note was dictated with voice recognition software. Similar sounding words can inadvertently be transcribed and may not be corrected upon review.

## 2023-11-12 NOTE — Patient Instructions (Signed)
 We will schedule you for colonoscopy for surveillance purposes given your history of polyps prior.  You will need to hold your iron x 7 days prior to procedure.  I am going give you samples of Linzess  145 mcg daily.  Let me know in a week or so if you like this does better and I can change your prescription.  I have refilled your omeprazole  today.  It was very nice seeing you again.  Dr. Cindie

## 2023-11-13 ENCOUNTER — Ambulatory Visit (HOSPITAL_COMMUNITY): Attending: Psychiatry

## 2023-11-13 ENCOUNTER — Encounter (HOSPITAL_COMMUNITY): Payer: Self-pay

## 2023-11-13 DIAGNOSIS — R2689 Other abnormalities of gait and mobility: Secondary | ICD-10-CM | POA: Insufficient documentation

## 2023-11-13 DIAGNOSIS — M6281 Muscle weakness (generalized): Secondary | ICD-10-CM | POA: Insufficient documentation

## 2023-11-13 DIAGNOSIS — Z7409 Other reduced mobility: Secondary | ICD-10-CM | POA: Diagnosis not present

## 2023-11-13 DIAGNOSIS — R29898 Other symptoms and signs involving the musculoskeletal system: Secondary | ICD-10-CM | POA: Insufficient documentation

## 2023-11-13 DIAGNOSIS — M542 Cervicalgia: Secondary | ICD-10-CM | POA: Diagnosis not present

## 2023-11-13 NOTE — Therapy (Signed)
 OUTPATIENT PHYSICAL THERAPY THORACOLUMBAR TREATMENT   Patient Name: Mary [name] MRN: [id number] DOB:[birth year], 73 y.o., female Today's Date: 11/13/2023  END OF SESSION:  PT End of Session - 11/13/23 1522     Visit Number 2    Number of Visits 12    Date for PT Re-Evaluation 12/04/23    Authorization Type [employer] medicare    Authorization Time Period Auth requested    PT Start Time 1147    PT Stop Time 1225    PT Time Calculation (min) 38 min    Activity Tolerance Patient tolerated treatment well    Behavior During Therapy WFL for tasks assessed/performed           Past Medical History:  Diagnosis Date   Anemia    Arthritis    Chronic kidney disease    idiopathic micro hematuria    Complication of anesthesia    patient states does not take much medicine to put her to sleep    Edema    ankles to knees    GERD (gastroesophageal reflux disease)    Headache(784.0)    hx of migraines    Hyperlipidemia    Mitral valve prolapse    no symptoms    Myasthenia gravis (HCC)    PONV (postoperative nausea and vomiting)    Scalp alopecia    Seizures (HCC)    1 seizure 45 years ago; unknown etiology and no meds, no seizures since then.   Past Surgical History:  Procedure Laterality Date   BIOPSY  03/24/2020   Procedure: BIOPSY;  Surgeon: Cindie Carlin POUR, DO;  Location: [facility];  Service: Endoscopy;;  gastric duodenum   CERVICAL FUSION  2006   cervical 5-6    COLONOSCOPY N/A 03/03/2017   Procedure: COLONOSCOPY;  Surgeon: Harvey Margo CROME, MD;  Location: [facility];  Service: Endoscopy;  Laterality: N/A;  1:15 pm   COLONOSCOPY WITH PROPOFOL  N/A 03/24/2020   Procedure: COLONOSCOPY WITH PROPOFOL ;  Surgeon: Cindie Carlin POUR, DO;  Location: [facility];  Service: Endoscopy;  Laterality: N/A;  10:00am   COLONOSCOPY WITH PROPOFOL  N/A 10/02/2020   Procedure: COLONOSCOPY WITH PROPOFOL ;  Surgeon: Cindie Carlin POUR, DO;  Location: [facility];  Service: Endoscopy;   Laterality: N/A;  ASA II / 9:00   ESOPHAGOGASTRODUODENOSCOPY (EGD) WITH PROPOFOL  N/A 03/24/2020   Procedure: ESOPHAGOGASTRODUODENOSCOPY (EGD) WITH PROPOFOL ;  Surgeon: Cindie Carlin POUR, DO;  [facility]: [facility];  Service: Endoscopy;  Laterality: N/A;   HEMORRHOID SURGERY N/A 06/23/2017   Procedure: ANOSCOPY WITH REMOVAL OF SINGLE LESION;  Surgeon: Mavis Anes, MD;  Location: [facility];  Service: General;  Laterality: N/A;   MYOCARDIAL PERFUSION STUDY  07/02/2011   NO SCINTIGRAPHIC EVIDENCE OF INDUCIBLE MYOCARDIAL ISCHEMIA. POST-STRESS EF IS 87%.EXERCISE CAPACITY 9 METS. EKG SHOWS NSR AT 69. THERE IS HORIZONTAL ST DEPRESSION WITH EXERCISE IN II, III, AVF SEEN BEST AT EARLY RECOVERY. THIS IS PRESENT IN V3-V5 AS WELL AND RETURNS TO BASELINE AT THE END OF THE TEST. NORMAL STUDY.   POLYPECTOMY  03/24/2020   Procedure: POLYPECTOMY;  Surgeon: Cindie Carlin POUR, DO;  [facility]: [facility];  Service: Endoscopy;;  colon    POLYPECTOMY  10/02/2020   Procedure: POLYPECTOMY;  Surgeon: Cindie Carlin POUR, DO;  [facility]: [facility];  Service: Endoscopy;;   RENAL ARTERY DOPPLER  12/28/2003   CELIAC ARTERY: AT REST, 168.4 CM/S; INSPRIATION, 177.6 CM/S. ARCUATE LIGAMENT COMPRESSION SYNDROME. R & L KIDNEYS: RIGHT KIDNEY MEASURES SOME WHAT  SMALLER THAN LEFT. KIDNEY ARE ESSENTIALLY SYMMETRICAL IN SHAPE WITH NO OBVIOUS ABNORMALITY VISUALIZED. R & L RENAL ARTERIES: DEMONSTRATE NORMAL SPECTRA . NO SUGGESTION OF DIAMETER REDUCTION, DISSECTION, FIBROMUSCULAR DYSPLASIA OR ANY OTHER VASCULAR ABNOR   TOTAL KNEE ARTHROPLASTY Right 08/31/2013   Procedure: RIGHT TOTAL KNEE ARTHROPLASTY;  Surgeon: Donnice JONETTA Car, MD;  Location: [facility];  Service: Orthopedics;  Laterality: Right;   TOTAL KNEE ARTHROPLASTY Left 03/07/2015   Procedure: TOTAL KNEE ARTHROPLASTY;  Surgeon: Donnice Car, MD;  Location: [facility];  Service: Orthopedics;  Laterality: Left;   TRANSTHORACIC ECHOCARDIOGRAM  07/02/2011   LV SYSTOLIC FUNCTION NORMAL, EF=>55%,  INSIGNIFICANT PERICARDIAL EFFUSION, LEFT ATRIAL SIZE IS NORMAL, RV SYSTOLIC PRESSURE IS NORMAL. NO SIGN VALVULAR HEART DISEASE.   TUBAL LIGATION  1986   veins stripping      bilateral legs    Patient Active Problem List   Diagnosis Date Noted   Neck pain 03/13/2023   Gait abnormality 01/23/2023   Diplopia 01/23/2023   Gastritis 04/12/2020   Serrated polyp of colon 04/12/2020   Constipation 02/23/2020   Melena 02/23/2020   Esophageal reflux 02/23/2020   Condyloma acuminatum    Encounter for colonoscopy due to history of adenomatous colonic polyps    Overweight (BMI 25.0-29.9) 03/09/2015   S/P left TKA 03/07/2015   Palpitations 05/23/2014   Expected blood loss anemia 09/01/2013   S/P right TKA 08/31/2013   DJD (degenerative joint disease) of knee 08/31/2013   Pre-operative clearance, cardiac 08/17/2013   HTN (hypertension) 05/20/2013   Hyperlipidemia 05/20/2013   Migraine 02/23/2007   ALLERGIC RHINITIS 02/23/2007   TRACHEOBRONCHITIS 02/23/2007   COUGH 02/23/2007    PCP: Bertell Satterfield, MD  REFERRING PROVIDER: Charmel Ivan Hamming, MD  REFERRING DIAG: G62.9 (ICD-10-CM) - Polyneuropathy, unspecified  Rationale for Evaluation and Treatment: Rehabilitation  THERAPY DIAG:  Impairment of balance  Leg weakness, bilateral  Impaired functional mobility, balance, gait, and endurance  ONSET DATE: 4-5 Months   SUBJECTIVE:                                                                                                                                                                                           SUBJECTIVE STATEMENT: Pt states she has noticed a increase in foot slapping bilaterally during walking. Pt states compliance with HEP. Pt states she performs better on some days than others. Pt states she is having a pretty good day today.  Patient reports she's noticed she has tinging in feet, toes, that travels up to knees at times. Reports it makes her feel wobbly  at times. Reports she has tried massage to her feet and LE and  reports it may be helping some but not much. Harder to maintain balance in dark when she gets up in the middle of the night.   PERTINENT HISTORY:  Myastenia Gravis 2 TKR  PAIN:  Are you having pain? Yes: NPRS scale: 2/10 foot, 3/10 headache (constant) Pain location: Bottom of R foot, headache/neck pain Pain description: Achy, N/T Aggravating factors: Walking  Relieving factors: Rest  PRECAUTIONS: None  RED FLAGS: Neuropathy    WEIGHT BEARING RESTRICTIONS: No  FALLS:  Has patient fallen in last 6 months? Yes. Number of falls 1. A lot of near falls. On uneven surfaces.   LIVING ENVIRONMENT: Lives with: lives alone Stairs: Yes: Internal: 12 steps; on right going up and External: 6 steps; can reach both Has following equipment at home: None Has RW and SPC  OCCUPATION: Retired, Pension scheme manager  PLOF: Independent  PATIENT GOALS: To increase stability and see if we can figure out how to get Neuropathy pain from getting worse  NEXT MD VISIT: October 17th, 2025  OBJECTIVE:  Note: Objective measures were completed at Evaluation unless otherwise noted.  DIAGNOSTIC FINDINGS:    PATIENT SURVEYS:  Total ABC score: 1300 / 1600 = 81.3 %  COGNITION: Overall cognitive status: Within functional limits for tasks assessed     SENSATION: WFL   POSTURE: rounded shoulders, forward head, and increased thoracic kyphosis  PALPATION:   LUMBAR ROM:   AROM eval  Flexion   Extension   Right lateral flexion   Left lateral flexion   Right rotation   Left rotation    (Blank rows = not tested)  LOWER EXTREMITY ROM:     Active  Right eval Left eval  Hip flexion    Hip extension    Hip abduction    Hip adduction    Hip internal rotation    Hip external rotation    Knee flexion    Knee extension    Ankle dorsiflexion    Ankle plantarflexion    Ankle inversion    Ankle eversion     (Blank rows =  not tested)  LOWER EXTREMITY MMT:    MMT Right eval Left eval  Hip flexion 4- 4-  Hip extension 3+ 3+  Hip abduction 4- 4-  Hip adduction    Hip internal rotation    Hip external rotation    Knee flexion 3+ 3+  Knee extension 4- 4-  Ankle dorsiflexion 5 5  Ankle plantarflexion    Ankle inversion    Ankle eversion     (Blank rows = not tested)  LUMBAR SPECIAL TESTS:    FUNCTIONAL TESTS:  4 Stage Balance: Side by side: 30 Semi-tandem: 30 ea LE leading Tandem:  30 ea LE leading  SLS: L- 22 R- 11  11/13/23: TUG: 7.58 seconds Functional Gait Assessment Summary 1. GAIT LEVEL SURFACE: Moderate impairment -- gait level surface (1)  (1 points) 2. CHANGE IN GAIT SPEED: Mild impairment -- change in gait speed (2)  (2 points) 3. GAIT WITH HORIZONTAL HEAD TURNS: Mild impairment -- gait with horizontal head turns (2)  (2 points) 4. GAIT WITH VERTICAL HEAD TURNS: Moderate impairment -- gait with vertical head turns (1)  (1 points) 5. GAIT AND PIVOT TURN: Moderate impairment -- gait and pivot turn (1)  (1 points) 6. STEP OVER OBSTACLE: Normal -- step over obstacle (3)  (3 points) 7. GAIT WITH NARROW BASE OF SUPPORT: Mild impairment -- gait with narrow base of support (2)  (2 points) 8.  GAIT WITH EYES CLOSED: Mild impairment -- gait with eyes closed (2)  (2 points) 9. AMBULATING BACKWARDS: Mild impairment -- ambulating backwards (2)  (2 points) 10. STEPS: Normal -- up and down steps (3)  (3 points) Functional Gait Assessment: 19/30=63.3 percent.  GAIT: Distance walked: 2' Assistive device utilized: None Level of assistance: Complete Independence Comments: Mildly unsteady at times  TREATMENT DATE:  11/13/2023  Therapeutic Exercise: - -Lateral stepping 3 laps 20 feet per lap, second 2 with RTB around ankles, pt cued for upright posture -Sit to stands, 2 sets of 5 reps, throughout session   Neuromuscular Re-education: -TUG -FGA -Speed step ups,  8 inch step, 30 second bout, 13 reps -Walking marches/butt kicks, 2 laps, 40 feet lap, 3 lb ankle weights, gait belt for safety -Lateral stepping with 3 lb ankle weights, 2 laps, 40 foot lap, gait belt for saftey    11/06/23: PT Eval and HEP                                                                                                                                 PATIENT EDUCATION:  Education details: PT evaluation, objective findings, POC, Importance of HEP, Precautions, Clinic policies  Person educated: Patient Education method: Explanation and Demonstration Education comprehension: verbalized understanding and returned demonstration  HOME EXERCISE PROGRAM: Access Code: [email or web address] URL: [email or web address] Date: 11/06/2023 Prepared by: Rosaria Powell-Butler  Exercises - Sit to Stand  - 2 x daily - 7 x weekly - 3 sets - 10 reps - Standing Hip Extension with Counter Support  - 2 x daily - 7 x weekly - 3 sets - 10 reps - Side Stepping with Counter Support  - 2 x daily - 7 x weekly - 3 sets - 10 reps - Standing Tandem Balance with Counter Support  - 2 x daily - 7 x weekly - 3 sets - 30 hold - Standing Single Leg Stance with Counter Support  - 2 x daily - 7 x weekly - 3 sets - 30 hold  ASSESSMENT:  CLINICAL IMPRESSION: Patient continues to demonstrate decreased LE strength, decreased gait quality and balance. Patient also demonstrates expected endurance issues with prolonged standing with Myasthenia gravis diagnosis, pt educated on the importance of rest breaks and higher intensity but shorter bouts of exercise. Pt does demonstrate increased fall risk on FGA during assessment today. Patient able to progress dynamic balance and core activation exercises today with FGA and walking marches with ankle weights, good performance with verbal cueing. Patient would continue to benefit from skilled physical therapy for increased endurance with ambulation, increased LE strength, and  improved balance for improved quality of life, improved independence with muscle maintenance and continued progress towards therapy goals.    Patient is a 73 y.o. female who was seen today for physical therapy evaluation and treatment for G62.9 (ICD-10-CM) - Polyneuropathy, unspecified. Patient arrives to PT with relevant history of Myastenia Gravis and new polyneuropathy/peripheral neuropathy  diagnosis. Patient reports her biggest issue this date is balance and endurance. On this date, patient demonstrates decreased LE strength, and deficits with static balance.  Patient educated on taking anticipatory breaks when performing her usual daily tasks to aid in decreasing chances of falls due to fatigue. Patient will benefit from skilled physical therapy in order to address strength, balance, and endurance to improve function/QOL.    OBJECTIVE IMPAIRMENTS: decreased activity tolerance, decreased balance, decreased endurance, difficulty walking, and decreased strength.   ACTIVITY LIMITATIONS: lifting, standing, squatting, stairs, transfers, and reach over head  PARTICIPATION LIMITATIONS: meal prep, cleaning, laundry, community activity, and yard work  PERSONAL FACTORS: N/A are also affecting patient's functional outcome.   REHAB POTENTIAL: Good  CLINICAL DECISION MAKING: Stable/uncomplicated  EVALUATION COMPLEXITY: Low   GOALS: Goals reviewed with patient? No  SHORT TERM GOALS: Target date: 11/20/23 Patient will be independent with performance of HEP to demonstrate adequate self management of symptoms.  Baseline:  Goal status: INITIAL  2.   Patient will report at least a 25% improvement with function or pain overall since beginning PT. Baseline:  Goal status: INITIAL  LONG TERM GOALS: Target date: 12/18/23  Patient will improve ABC Scale by at least 10.5 points in order to demonstrate improved self-perceived function while meeting MCID (based on MCID for MS patients) Baseline:  Goal  status: INITIAL  2.  Patient will maintain single leg balance for 30 seconds bilaterally in order to demonstrate safety with single leg balancing tasks such as stair navigation.  Baseline:  Goal status: INITIAL  3.  Patient will report 0 near falls within a weeks time in order to demonstrate improved risk of falls.  Baseline:  Goal status: INITIAL 4. Patient will report at least a 50% improvement with function or pain overall since beginning PT. Baseline:  Goal status: INITIAL  PLAN:  PT FREQUENCY: 2x/week  PT DURATION: 6 weeks  PLANNED INTERVENTIONS: 97164- PT Re-evaluation, 97110-Therapeutic exercises, 97530- Therapeutic activity, [id number]- Neuromuscular re-education, 97535- Self Care, [id number]- Manual therapy, [id number]- Gait training, [phone]- Electrical stimulation (manual), [id number]- Traction (mechanical), [phone] (1-2 muscles), [id number] (3+ muscles)- Dry Needling, Patient/Family education, Balance training, Stair training, Taping, Joint mobilization, Spinal mobilization, Cryotherapy, and Moist heat.  PLAN FOR NEXT SESSION: Review HEP and goals,  progress LE strength and balance *Use RPE for fatigue rating due to Myastenia Gravis diag   Lang Ada, PT, DPT [facility] Office: [phone] 3:26 PM, 11/13/23

## 2023-11-18 ENCOUNTER — Ambulatory Visit (INDEPENDENT_AMBULATORY_CARE_PROVIDER_SITE_OTHER): Payer: Medicare PPO | Admitting: Otolaryngology

## 2023-11-18 ENCOUNTER — Encounter (INDEPENDENT_AMBULATORY_CARE_PROVIDER_SITE_OTHER): Payer: Self-pay | Admitting: Otolaryngology

## 2023-11-18 VITALS — BP 117/68 | HR 63

## 2023-11-18 DIAGNOSIS — J310 Chronic rhinitis: Secondary | ICD-10-CM | POA: Diagnosis not present

## 2023-11-18 DIAGNOSIS — R0982 Postnasal drip: Secondary | ICD-10-CM | POA: Diagnosis not present

## 2023-11-18 DIAGNOSIS — J343 Hypertrophy of nasal turbinates: Secondary | ICD-10-CM | POA: Diagnosis not present

## 2023-11-18 DIAGNOSIS — R0981 Nasal congestion: Secondary | ICD-10-CM

## 2023-11-18 NOTE — Progress Notes (Signed)
 Patient ID: Mary [name], female   DOB: 18-Jul-1950, 73 y.o.   MRN: [id number]  Follow-up: Chronic nasal congestion, nasal drainage  HPI: The patient is a 73 year old female who returns today for her follow-up evaluation.  The patient was last seen in 2024.  At that time, she was noted to have chronic rhinitis, with nasal mucosal congestion, bilateral inferior turbinate hypertrophy, and postnasal drip.  She was treated with Atrovent nasal spray.  According to the patient, her symptoms have improved with the use of Atrovent.  She still has occasional congestion and drainage.  She is using Allegra as needed.  Exam: General: Communicates without difficulty, well nourished, no acute distress. Head: Normocephalic, no evidence injury, no tenderness, facial buttresses intact without stepoff. Face/sinus: No tenderness to palpation and percussion. Facial movement is normal and symmetric. Eyes: PERRL, EOMI. No scleral icterus, conjunctivae clear. Neuro: CN II exam reveals vision grossly intact.  No nystagmus at any point of gaze. Ears: Auricles well formed without lesions.  Ear canals are intact without mass or lesion.  No erythema or edema is appreciated.  The TMs are intact without fluid. Nose: External evaluation reveals normal support and skin without lesions.  Dorsum is intact.  Anterior rhinoscopy reveals congested mucosa over anterior aspect of inferior turbinates and intact septum.  No purulence noted. Oral:  Oral cavity and oropharynx are intact, symmetric, without erythema or edema.  Mucosa is moist without lesions. Neck: Full range of motion without pain.  There is no significant lymphadenopathy.  No masses palpable.  Thyroid  bed within normal limits to palpation.  Parotid glands and submandibular glands equal bilaterally without mass.  Trachea is midline. Neuro:  CN 2-12 grossly intact.   Assessment: 1.  Chronic rhinitis with nasal mucosal congestion, bilateral inferior turbinate hypertrophy, and  chronic postnasal drainage. 2.  No polyps, mass, lesion, or infection is noted.  Plan: 1.  The nasal endoscopy findings are reviewed with the patient. 2.  Continue with Allegra and Atrovent nasal spray as needed.   3.  The patient will return for reevaluation in 1 year, sooner if needed.

## 2023-11-20 ENCOUNTER — Telehealth (INDEPENDENT_AMBULATORY_CARE_PROVIDER_SITE_OTHER): Payer: Self-pay | Admitting: *Deleted

## 2023-11-20 ENCOUNTER — Other Ambulatory Visit (INDEPENDENT_AMBULATORY_CARE_PROVIDER_SITE_OTHER): Payer: Self-pay | Admitting: *Deleted

## 2023-11-20 ENCOUNTER — Ambulatory Visit (HOSPITAL_COMMUNITY): Admitting: Physical Therapy

## 2023-11-20 ENCOUNTER — Encounter (INDEPENDENT_AMBULATORY_CARE_PROVIDER_SITE_OTHER): Payer: Self-pay | Admitting: *Deleted

## 2023-11-20 DIAGNOSIS — Z7409 Other reduced mobility: Secondary | ICD-10-CM | POA: Diagnosis not present

## 2023-11-20 DIAGNOSIS — M542 Cervicalgia: Secondary | ICD-10-CM | POA: Diagnosis not present

## 2023-11-20 DIAGNOSIS — R29898 Other symptoms and signs involving the musculoskeletal system: Secondary | ICD-10-CM

## 2023-11-20 DIAGNOSIS — M6281 Muscle weakness (generalized): Secondary | ICD-10-CM | POA: Diagnosis not present

## 2023-11-20 DIAGNOSIS — R2689 Other abnormalities of gait and mobility: Secondary | ICD-10-CM

## 2023-11-20 MED ORDER — NA SULFATE-K SULFATE-MG SULF 17.5-3.13-1.6 GM/177ML PO SOLN: ORAL | 0 refills | Status: DC

## 2023-11-20 NOTE — Therapy (Signed)
 OUTPATIENT PHYSICAL THERAPY THORACOLUMBAR TREATMENT   Patient Name: Mary [name] MRN: [id number] DOB:09-Dec-1950, 73 y.o., female Today's Date: 11/20/2023  END OF SESSION:  PT End of Session - 11/20/23 1255     Visit Number 3    Number of Visits 12    Date for PT Re-Evaluation 12/04/23    Authorization Type [employer] medicare    Authorization Time Period Auth requested    PT Start Time 1105    PT Stop Time 1145    PT Time Calculation (min) 40 min    Activity Tolerance Patient tolerated treatment well    Behavior During Therapy WFL for tasks assessed/performed            Past Medical History:  Diagnosis Date   Anemia    Arthritis    Chronic kidney disease    idiopathic micro hematuria    Complication of anesthesia    patient states does not take much medicine to put her to sleep    Edema    ankles to knees    GERD (gastroesophageal reflux disease)    Headache(784.0)    hx of migraines    Hyperlipidemia    Mitral valve prolapse    no symptoms    Myasthenia gravis (HCC)    PONV (postoperative nausea and vomiting)    Scalp alopecia    Seizures (HCC)    1 seizure 45 years ago; unknown etiology and no meds, no seizures since then.   Past Surgical History:  Procedure Laterality Date   BIOPSY  03/24/2020   Procedure: BIOPSY;  Surgeon: Cindie Carlin POUR, DO;  Location: [facility];  Service: Endoscopy;;  gastric duodenum   CERVICAL FUSION  2006   cervical 5-6    COLONOSCOPY N/A 03/03/2017   Procedure: COLONOSCOPY;  Surgeon: Harvey Margo CROME, MD;  Location: [facility];  Service: Endoscopy;  Laterality: N/A;  1:15 pm   COLONOSCOPY WITH PROPOFOL  N/A 03/24/2020   Procedure: COLONOSCOPY WITH PROPOFOL ;  Surgeon: Cindie Carlin POUR, DO;  [facility]: [facility];  Service: Endoscopy;  Laterality: N/A;  10:00am   COLONOSCOPY WITH PROPOFOL  N/A 10/02/2020   Procedure: COLONOSCOPY WITH PROPOFOL ;  Surgeon: Cindie Carlin POUR, DO;  [facility]: [facility];  Service: Endoscopy;   Laterality: N/A;  ASA II / 9:00   ESOPHAGOGASTRODUODENOSCOPY (EGD) WITH PROPOFOL  N/A 03/24/2020   Procedure: ESOPHAGOGASTRODUODENOSCOPY (EGD) WITH PROPOFOL ;  Surgeon: Cindie Carlin POUR, DO;  [facility]: [facility];  Service: Endoscopy;  Laterality: N/A;   HEMORRHOID SURGERY N/A 06/23/2017   Procedure: ANOSCOPY WITH REMOVAL OF SINGLE LESION;  Surgeon: Mavis Anes, MD;  Location: [facility];  Service: General;  Laterality: N/A;   MYOCARDIAL PERFUSION STUDY  07/02/2011   NO SCINTIGRAPHIC EVIDENCE OF INDUCIBLE MYOCARDIAL ISCHEMIA. POST-STRESS EF IS 87%.EXERCISE CAPACITY 9 METS. EKG SHOWS NSR AT 69. THERE IS HORIZONTAL ST DEPRESSION WITH EXERCISE IN II, III, AVF SEEN BEST AT EARLY RECOVERY. THIS IS PRESENT IN V3-V5 AS WELL AND RETURNS TO BASELINE AT THE END OF THE TEST. NORMAL STUDY.   POLYPECTOMY  03/24/2020   Procedure: POLYPECTOMY;  Surgeon: Cindie Carlin POUR, DO;  [facility]: [facility];  Service: Endoscopy;;  colon    POLYPECTOMY  10/02/2020   Procedure: POLYPECTOMY;  Surgeon: Cindie Carlin POUR, DO;  [facility]: [facility];  Service: Endoscopy;;   RENAL ARTERY DOPPLER  12/28/2003   CELIAC ARTERY: AT REST, 168.4 CM/S; INSPRIATION, 177.6 CM/S. ARCUATE LIGAMENT COMPRESSION SYNDROME. R & L KIDNEYS: RIGHT KIDNEY MEASURES SOME  WHAT SMALLER THAN LEFT. KIDNEY ARE ESSENTIALLY SYMMETRICAL IN SHAPE WITH NO OBVIOUS ABNORMALITY VISUALIZED. R & L RENAL ARTERIES: DEMONSTRATE NORMAL SPECTRA . NO SUGGESTION OF DIAMETER REDUCTION, DISSECTION, FIBROMUSCULAR DYSPLASIA OR ANY OTHER VASCULAR ABNOR   TOTAL KNEE ARTHROPLASTY Right 08/31/2013   Procedure: RIGHT TOTAL KNEE ARTHROPLASTY;  Surgeon: Donnice JONETTA Car, MD;  Location: [facility];  Service: Orthopedics;  Laterality: Right;   TOTAL KNEE ARTHROPLASTY Left 03/07/2015   Procedure: TOTAL KNEE ARTHROPLASTY;  Surgeon: Donnice Car, MD;  Location: [facility];  Service: Orthopedics;  Laterality: Left;   TRANSTHORACIC ECHOCARDIOGRAM  07/02/2011   LV SYSTOLIC FUNCTION NORMAL, EF=>55%,  INSIGNIFICANT PERICARDIAL EFFUSION, LEFT ATRIAL SIZE IS NORMAL, RV SYSTOLIC PRESSURE IS NORMAL. NO SIGN VALVULAR HEART DISEASE.   TUBAL LIGATION  1986   veins stripping      bilateral legs    Patient Active Problem List   Diagnosis Date Noted   Chronic rhinitis 11/18/2023   Hypertrophy of nasal turbinates 11/18/2023   Postnasal drip 11/18/2023   Neck pain 03/13/2023   Gait abnormality 01/23/2023   Diplopia 01/23/2023   Gastritis 04/12/2020   Serrated polyp of colon 04/12/2020   Constipation 02/23/2020   Melena 02/23/2020   Esophageal reflux 02/23/2020   Condyloma acuminatum    Encounter for colonoscopy due to history of adenomatous colonic polyps    Overweight (BMI 25.0-29.9) 03/09/2015   S/P left TKA 03/07/2015   Palpitations 05/23/2014   Expected blood loss anemia 09/01/2013   S/P right TKA 08/31/2013   DJD (degenerative joint disease) of knee 08/31/2013   Pre-operative clearance, cardiac 08/17/2013   HTN (hypertension) 05/20/2013   Hyperlipidemia 05/20/2013   Migraine 02/23/2007   ALLERGIC RHINITIS 02/23/2007   TRACHEOBRONCHITIS 02/23/2007   COUGH 02/23/2007    PCP: Bertell Satterfield, MD  REFERRING PROVIDER: Charmel Ivan Hamming, MD  REFERRING DIAG: G62.9 (ICD-10-CM) - Polyneuropathy, unspecified  Rationale for Evaluation and Treatment: Rehabilitation  THERAPY DIAG:  Impairment of balance  Leg weakness, bilateral  Impaired functional mobility, balance, gait, and endurance  Cervicalgia  Muscle weakness (generalized)  ONSET DATE: 4-5 Months   SUBJECTIVE:                                                                                                                                                                                           SUBJECTIVE STATEMENT: Pt reports compliance with HEP.  No pain currently. Gave blood yesterday so feeling a little lightheaded.  Patient reports she's noticed she has tinging in feet, toes, that travels up to knees at  times. Reports it makes her feel wobbly at times. Reports she has tried massage to her feet  and LE and reports it may be helping some but not much. Harder to maintain balance in dark when she gets up in the middle of the night.   PERTINENT HISTORY:  Myastenia Gravis 2 TKR  PAIN:  Are you having pain? Yes: NPRS scale: 2/10 foot, 3/10 headache (constant) Pain location: Bottom of R foot, headache/neck pain Pain description: Achy, N/T Aggravating factors: Walking  Relieving factors: Rest  PRECAUTIONS: None  RED FLAGS: Neuropathy    WEIGHT BEARING RESTRICTIONS: No  FALLS:  Has patient fallen in last 6 months? Yes. Number of falls 1. A lot of near falls. On uneven surfaces.   LIVING ENVIRONMENT: Lives with: lives alone Stairs: Yes: Internal: 12 steps; on right going up and External: 6 steps; can reach both Has following equipment at home: None Has RW and SPC  OCCUPATION: Retired, Pension scheme manager  PLOF: Independent  PATIENT GOALS: To increase stability and see if we can figure out how to get Neuropathy pain from getting worse  NEXT MD VISIT: October 17th, 2025  OBJECTIVE:  Note: Objective measures were completed at Evaluation unless otherwise noted.  DIAGNOSTIC FINDINGS:    PATIENT SURVEYS:  Total ABC score: 1300 / 1600 = 81.3 %  COGNITION: Overall cognitive status: Within functional limits for tasks assessed     SENSATION: WFL   POSTURE: rounded shoulders, forward head, and increased thoracic kyphosis  PALPATION:   LUMBAR ROM:   AROM eval  Flexion   Extension   Right lateral flexion   Left lateral flexion   Right rotation   Left rotation    (Blank rows = not tested)  LOWER EXTREMITY ROM:     Active  Right eval Left eval  Hip flexion    Hip extension    Hip abduction    Hip adduction    Hip internal rotation    Hip external rotation    Knee flexion    Knee extension    Ankle dorsiflexion    Ankle plantarflexion    Ankle  inversion    Ankle eversion     (Blank rows = not tested)  LOWER EXTREMITY MMT:    MMT Right eval Left eval  Hip flexion 4- 4-  Hip extension 3+ 3+  Hip abduction 4- 4-  Hip adduction    Hip internal rotation    Hip external rotation    Knee flexion 3+ 3+  Knee extension 4- 4-  Ankle dorsiflexion 5 5  Ankle plantarflexion    Ankle inversion    Ankle eversion     (Blank rows = not tested)  LUMBAR SPECIAL TESTS:    FUNCTIONAL TESTS:  4 Stage Balance: Side by side: 30 Semi-tandem: 30 ea LE leading Tandem:  30 ea LE leading  SLS: L- 22 R- 11  11/13/23: TUG: 7.58 seconds Functional Gait Assessment Summary 1. GAIT LEVEL SURFACE: Moderate impairment -- gait level surface (1)  (1 points) 2. CHANGE IN GAIT SPEED: Mild impairment -- change in gait speed (2)  (2 points) 3. GAIT WITH HORIZONTAL HEAD TURNS: Mild impairment -- gait with horizontal head turns (2)  (2 points) 4. GAIT WITH VERTICAL HEAD TURNS: Moderate impairment -- gait with vertical head turns (1)  (1 points) 5. GAIT AND PIVOT TURN: Moderate impairment -- gait and pivot turn (1)  (1 points) 6. STEP OVER OBSTACLE: Normal -- step over obstacle (3)  (3 points) 7. GAIT WITH NARROW BASE OF SUPPORT: Mild impairment -- gait with narrow base of support (2)  (  2 points) 8. GAIT WITH EYES CLOSED: Mild impairment -- gait with eyes closed (2)  (2 points) 9. AMBULATING BACKWARDS: Mild impairment -- ambulating backwards (2)  (2 points) 10. STEPS: Normal -- up and down steps (3)  (3 points) Functional Gait Assessment: 19/30=63.3 percent.  GAIT: Distance walked: 65' Assistive device utilized: None Level of assistance: Complete Independence Comments: Mildly unsteady at times  TREATMENT DATE:  11/20/23 BP:  121/69, HR 61bpm Standing:  hip abduction 10X each  Hip extension 10X each  Side stepping with RTB 20 feet 3RT  Lunge onto BOSU dome up no UE 2X10 each side 7 stair negotiation 4 steps no HR  5RT reciprocally Sit to stands no UE 2X10 Retro ambulation 20 foot 2RT Tandem gait 20 foot 2RT Hurdle step overs 2/12, 2/6 3RT each Nustep UE/LE 5 minutes level 3 beach  seat 7    11/13/2023  Therapeutic Exercise: - -Lateral stepping 3 laps 20 feet per lap, second 2 with RTB around ankles, pt cued for upright posture -Sit to stands, 2 sets of 5 reps, throughout session   Neuromuscular Re-education: -TUG -FGA -Speed step ups, 8 inch step, 30 second bout, 13 reps -Walking marches/butt kicks, 2 laps, 40 feet lap, 3 lb ankle weights, gait belt for safety -Lateral stepping with 3 lb ankle weights, 2 laps, 40 foot lap, gait belt for saftey    11/06/23: PT Eval and HEP                                                                                                                                 PATIENT EDUCATION:  Education details: PT evaluation, objective findings, POC, Importance of HEP, Precautions, Clinic policies  Person educated: Patient Education method: Explanation and Demonstration Education comprehension: verbalized understanding and returned demonstration  HOME EXERCISE PROGRAM: Access Code: [email or web address] URL: [email or web address] Date: 11/06/2023 Prepared by: Rosaria Powell-Butler  Exercises - Sit to Stand  - 2 x daily - 7 x weekly - 3 sets - 10 reps - Standing Hip Extension with Counter Support  - 2 x daily - 7 x weekly - 3 sets - 10 reps - Side Stepping with Counter Support  - 2 x daily - 7 x weekly - 3 sets - 10 reps - Standing Tandem Balance with Counter Support  - 2 x daily - 7 x weekly - 3 sets - 30 hold - Standing Single Leg Stance with Counter Support  - 2 x daily - 7 x weekly - 3 sets - 30 hold  ASSESSMENT:  CLINICAL IMPRESSION: BP and HR taken initially and both normal.  Continued with focus on improving LE strength and stability.  Cues needed to isolate correct mm, especially with hip abduction activities as tends to laterally shift to  substitute hip flexors.  Also noted with bosu lunges with cues to reduce trunk rotation with Lt LE leading.  Progressed dyanamic balance activities with cues for maintaining forward gaze.  SB-CGA required but able to self correct any slight LOB.  Pt required 3 short seated rest breaks today due to fatique but overall tolerated well.  Pt will continue to benefit from skilled physical therapy for increased endurance with ambulation, increased LE strength, and improved balance for improved quality of life, improved independence with muscle maintenance and continued progress towards therapy goals.    Patient is a 73 y.o. female who was seen today for physical therapy evaluation and treatment for G62.9 (ICD-10-CM) - Polyneuropathy, unspecified. Patient arrives to PT with relevant history of Myastenia Gravis and new polyneuropathy/peripheral neuropathy diagnosis. Patient reports her biggest issue this date is balance and endurance. On this date, patient demonstrates decreased LE strength, and deficits with static balance.  Patient educated on taking anticipatory breaks when performing her usual daily tasks to aid in decreasing chances of falls due to fatigue. Patient will benefit from skilled physical therapy in order to address strength, balance, and endurance to improve function/QOL.    OBJECTIVE IMPAIRMENTS: decreased activity tolerance, decreased balance, decreased endurance, difficulty walking, and decreased strength.   ACTIVITY LIMITATIONS: lifting, standing, squatting, stairs, transfers, and reach over head  PARTICIPATION LIMITATIONS: meal prep, cleaning, laundry, community activity, and yard work  PERSONAL FACTORS: N/A are also affecting patient's functional outcome.   REHAB POTENTIAL: Good  CLINICAL DECISION MAKING: Stable/uncomplicated  EVALUATION COMPLEXITY: Low   GOALS: Goals reviewed with patient? No  SHORT TERM GOALS: Target date: 11/20/23 Patient will be independent with performance  of HEP to demonstrate adequate self management of symptoms.  Baseline:  Goal status: INITIAL  2.   Patient will report at least a 25% improvement with function or pain overall since beginning PT. Baseline:  Goal status: INITIAL  LONG TERM GOALS: Target date: 12/18/23  Patient will improve ABC Scale by at least 10.5 points in order to demonstrate improved self-perceived function while meeting MCID (based on MCID for MS patients) Baseline:  Goal status: INITIAL  2.  Patient will maintain single leg balance for 30 seconds bilaterally in order to demonstrate safety with single leg balancing tasks such as stair navigation.  Baseline:  Goal status: INITIAL  3.  Patient will report 0 near falls within a weeks time in order to demonstrate improved risk of falls.  Baseline:  Goal status: INITIAL 4. Patient will report at least a 50% improvement with function or pain overall since beginning PT. Baseline:  Goal status: INITIAL  PLAN:  PT FREQUENCY: 2x/week  PT DURATION: 6 weeks  PLANNED INTERVENTIONS: 97164- PT Re-evaluation, 97110-Therapeutic exercises, 97530- Therapeutic activity, [id number]- Neuromuscular re-education, 97535- Self Care, [id number]- Manual therapy, [id number]- Gait training, [phone]- Electrical stimulation (manual), [id number]- Traction (mechanical), [phone] (1-2 muscles), 20561 (3+ muscles)- Dry Needling, Patient/Family education, Balance training, Stair training, Taping, Joint mobilization, Spinal mobilization, Cryotherapy, and Moist heat.  PLAN FOR NEXT SESSION: Progress LE strength and balance *Use RPE for fatigue rating due to Myastenia Gravis diag   Greig KATHEE Fuse, PTA/CLT [facility] Ph: [phone] 12:57 PM, 11/20/23

## 2023-11-20 NOTE — Telephone Encounter (Signed)
 [employer] PA for [employer]:  Information about your requested care Prior authorization is not required for this code. If you would like to submit this code for review, please login to [employer], or contact [employer]; for Medicare call [phone], for Commercial call [phone]

## 2023-11-24 ENCOUNTER — Telehealth: Payer: Self-pay

## 2023-11-24 NOTE — Telephone Encounter (Signed)
 Pt phoned advising the Linz[employer]  145 mcg samples worked great and she would like a 90 day supply sent to [facility]. Please advise

## 2023-11-26 ENCOUNTER — Encounter (HOSPITAL_COMMUNITY): Payer: Self-pay

## 2023-11-26 ENCOUNTER — Ambulatory Visit (HOSPITAL_COMMUNITY)

## 2023-11-26 DIAGNOSIS — M6281 Muscle weakness (generalized): Secondary | ICD-10-CM | POA: Diagnosis not present

## 2023-11-26 DIAGNOSIS — M542 Cervicalgia: Secondary | ICD-10-CM | POA: Diagnosis not present

## 2023-11-26 DIAGNOSIS — R29898 Other symptoms and signs involving the musculoskeletal system: Secondary | ICD-10-CM

## 2023-11-26 DIAGNOSIS — Z7409 Other reduced mobility: Secondary | ICD-10-CM | POA: Diagnosis not present

## 2023-11-26 DIAGNOSIS — R2689 Other abnormalities of gait and mobility: Secondary | ICD-10-CM | POA: Diagnosis not present

## 2023-11-26 NOTE — Therapy (Signed)
 OUTPATIENT PHYSICAL THERAPY THORACOLUMBAR TREATMENT   Patient Name: Mary [name] MRN: [id number] DOB:[birth year], 73 y.o., female Today's Date: 11/26/2023  END OF SESSION:  PT End of Session - 11/26/23 1605     Visit Number 4    Number of Visits 12    Date for PT Re-Evaluation 12/04/23    Authorization Type [employer]are    Authorization Time Period Auth requested    Authorization - Visit Number 3    PT Start Time 1605    PT Stop Time 1645    PT Time Calculation (min) 40 min    Activity Tolerance Patient tolerated treatment well    Behavior During Therapy WFL for tasks assessed/performed             Past Medical History:  Diagnosis Date   Anemia    Arthritis    Chronic kidney disease    idiopathic micro hematuria    Complication of anesthesia    patient states does not take much medicine to put her to sleep    Edema    ankles to knees    GERD (gastroesophageal reflux disease)    Headache(784.0)    hx of migraines    Hyperlipidemia    Mitral valve prolapse    no symptoms    Myasthenia gravis (HCC)    PONV (postoperative nausea and vomiting)    Scalp alopecia    Seizures (HCC)    1 seizure 45 years ago; unknown etiology and no meds, no seizures since then.   Past Surgical History:  Procedure Laterality Date   BIOPSY  03/24/2020   Procedure: BIOPSY;  Surgeon: Cindie Carlin POUR, DO;  Location: [facility];  Service: Endoscopy;;  gastric duodenum   CERVICAL FUSION  2006   cervical 5-6    COLONOSCOPY N/A 03/03/2017   Procedure: COLONOSCOPY;  Surgeon: Harvey Margo CROME, MD;  Location: [facility];  Service: Endoscopy;  Laterality: N/A;  1:15 pm   COLONOSCOPY WITH PROPOFOL  N/A 03/24/2020   Procedure: COLONOSCOPY WITH PROPOFOL ;  Surgeon: Cindie Carlin POUR, DO;  Location: [facility];  Service: Endoscopy;  Laterality: N/A;  10:00am   COLONOSCOPY WITH PROPOFOL  N/A 10/02/2020   Procedure: COLONOSCOPY WITH PROPOFOL ;  Surgeon: Cindie Carlin POUR, DO;  Location:  [facility];  Service: Endoscopy;  Laterality: N/A;  ASA II / 9:00   ESOPHAGOGASTRODUODENOSCOPY (EGD) WITH PROPOFOL  N/A 03/24/2020   Procedure: ESOPHAGOGASTRODUODENOSCOPY (EGD) WITH PROPOFOL ;  Surgeon: Cindie Carlin POUR, DO;  Location: [facility];  Service: Endoscopy;  Laterality: N/A;   HEMORRHOID SURGERY N/A 06/23/2017   Procedure: ANOSCOPY WITH REMOVAL OF SINGLE LESION;  Surgeon: Mavis Anes, MD;  Location: [facility];  Service: General;  Laterality: N/A;   MYOCARDIAL PERFUSION STUDY  07/02/2011   NO SCINTIGRAPHIC EVIDENCE OF INDUCIBLE MYOCARDIAL ISCHEMIA. POST-STRESS EF IS 87%.EXERCISE CAPACITY 9 METS. EKG SHOWS NSR AT 69. THERE IS HORIZONTAL ST DEPRESSION WITH EXERCISE IN II, III, AVF SEEN BEST AT EARLY RECOVERY. THIS IS PRESENT IN V3-V5 AS WELL AND RETURNS TO BASELINE AT THE END OF THE TEST. NORMAL STUDY.   POLYPECTOMY  03/24/2020   Procedure: POLYPECTOMY;  Surgeon: Cindie Carlin POUR, DO;  [facility]: [facility];  Service: Endoscopy;;  colon    POLYPECTOMY  10/02/2020   Procedure: POLYPECTOMY;  Surgeon: Cindie Carlin POUR, DO;  [facility]: [facility];  Service: Endoscopy;;   RENAL ARTERY DOPPLER  12/28/2003   CELIAC ARTERY: AT REST, 168.4 CM/S; INSPRIATION, 177.6 CM/S. ARCUATE LIGAMENT COMPRESSION  SYNDROME. R & L KIDNEYS: RIGHT KIDNEY MEASURES SOME WHAT SMALLER THAN LEFT. KIDNEY ARE ESSENTIALLY SYMMETRICAL IN SHAPE WITH NO OBVIOUS ABNORMALITY VISUALIZED. R & L RENAL ARTERIES: DEMONSTRATE NORMAL SPECTRA . NO SUGGESTION OF DIAMETER REDUCTION, DISSECTION, FIBROMUSCULAR DYSPLASIA OR ANY OTHER VASCULAR ABNOR   TOTAL KNEE ARTHROPLASTY Right 08/31/2013   Procedure: RIGHT TOTAL KNEE ARTHROPLASTY;  Surgeon: Donnice JONETTA Car, MD;  Location: [facility];  Service: Orthopedics;  Laterality: Right;   TOTAL KNEE ARTHROPLASTY Left 03/07/2015   Procedure: TOTAL KNEE ARTHROPLASTY;  Surgeon: Donnice Car, MD;  Location: [facility];  Service: Orthopedics;  Laterality: Left;   TRANSTHORACIC ECHOCARDIOGRAM  07/02/2011    LV SYSTOLIC FUNCTION NORMAL, EF=>55%, INSIGNIFICANT PERICARDIAL EFFUSION, LEFT ATRIAL SIZE IS NORMAL, RV SYSTOLIC PRESSURE IS NORMAL. NO SIGN VALVULAR HEART DISEASE.   TUBAL LIGATION  1986   veins stripping      bilateral legs    Patient Active Problem List   Diagnosis Date Noted   Chronic rhinitis 11/18/2023   Hypertrophy of nasal turbinates 11/18/2023   Postnasal drip 11/18/2023   Neck pain 03/13/2023   Gait abnormality 01/23/2023   Diplopia 01/23/2023   Gastritis 04/12/2020   Serrated polyp of colon 04/12/2020   Constipation 02/23/2020   Melena 02/23/2020   Esophageal reflux 02/23/2020   Condyloma acuminatum    Encounter for colonoscopy due to history of adenomatous colonic polyps    Overweight (BMI 25.0-29.9) 03/09/2015   S/P left TKA 03/07/2015   Palpitations 05/23/2014   Expected blood loss anemia 09/01/2013   S/P right TKA 08/31/2013   DJD (degenerative joint disease) of knee 08/31/2013   Pre-operative clearance, cardiac 08/17/2013   HTN (hypertension) 05/20/2013   Hyperlipidemia 05/20/2013   Migraine 02/23/2007   ALLERGIC RHINITIS 02/23/2007   TRACHEOBRONCHITIS 02/23/2007   COUGH 02/23/2007    PCP: Bertell Satterfield, MD  REFERRING PROVIDER: Charmel Ivan Hamming, MD  REFERRING DIAG: G62.9 (ICD-10-CM) - Polyneuropathy, unspecified  Rationale for Evaluation and Treatment: Rehabilitation  THERAPY DIAG:  Impairment of balance  Leg weakness, bilateral  Impaired functional mobility, balance, gait, and endurance  ONSET DATE: 4-5 Months   SUBJECTIVE:                                                                                                                                                                                           SUBJECTIVE STATEMENT: Pt states she is feeling more tired today than normal. Pt states she had to take multiple breaks preparing her lunch and eating lunch, fatigue level 7/10.  Patient reports she's noticed she has tinging in  feet, toes, that travels up to knees at times. Reports it makes  her feel wobbly at times. Reports she has tried massage to her feet and LE and reports it may be helping some but not much. Harder to maintain balance in dark when she gets up in the middle of the night.   PERTINENT HISTORY:  Myastenia Gravis 2 TKR  PAIN:  Are you having pain? Yes: NPRS scale: 2/10 foot, 3/10 headache (constant) Pain location: Bottom of R foot, headache/neck pain Pain description: Achy, N/T Aggravating factors: Walking  Relieving factors: Rest  PRECAUTIONS: None  RED FLAGS: Neuropathy    WEIGHT BEARING RESTRICTIONS: No  FALLS:  Has patient fallen in last 6 months? Yes. Number of falls 1. A lot of near falls. On uneven surfaces.   LIVING ENVIRONMENT: Lives with: lives alone Stairs: Yes: Internal: 12 steps; on right going up and External: 6 steps; can reach both Has following equipment at home: None Has RW and SPC  OCCUPATION: Retired, Pension scheme manager  PLOF: Independent  PATIENT GOALS: To increase stability and see if we can figure out how to get Neuropathy pain from getting worse  NEXT MD VISIT: October 17th, 2025  OBJECTIVE:  Note: Objective measures were completed at Evaluation unless otherwise noted.  DIAGNOSTIC FINDINGS:    PATIENT SURVEYS:  Total ABC score: 1300 / 1600 = 81.3 %  COGNITION: Overall cognitive status: Within functional limits for tasks assessed     SENSATION: WFL   POSTURE: rounded shoulders, forward head, and increased thoracic kyphosis  PALPATION:   LUMBAR ROM:   AROM eval  Flexion   Extension   Right lateral flexion   Left lateral flexion   Right rotation   Left rotation    (Blank rows = not tested)  LOWER EXTREMITY ROM:     Active  Right eval Left eval  Hip flexion    Hip extension    Hip abduction    Hip adduction    Hip internal rotation    Hip external rotation    Knee flexion    Knee extension    Ankle dorsiflexion     Ankle plantarflexion    Ankle inversion    Ankle eversion     (Blank rows = not tested)  LOWER EXTREMITY MMT:    MMT Right eval Left eval  Hip flexion 4- 4-  Hip extension 3+ 3+  Hip abduction 4- 4-  Hip adduction    Hip internal rotation    Hip external rotation    Knee flexion 3+ 3+  Knee extension 4- 4-  Ankle dorsiflexion 5 5  Ankle plantarflexion    Ankle inversion    Ankle eversion     (Blank rows = not tested)  LUMBAR SPECIAL TESTS:    FUNCTIONAL TESTS:  4 Stage Balance: Side by side: 30 Semi-tandem: 30 ea LE leading Tandem:  30 ea LE leading  SLS: L- 22 R- 11  11/13/23: TUG: 7.58 seconds Functional Gait Assessment Summary 1. GAIT LEVEL SURFACE: Moderate impairment -- gait level surface (1)  (1 points) 2. CHANGE IN GAIT SPEED: Mild impairment -- change in gait speed (2)  (2 points) 3. GAIT WITH HORIZONTAL HEAD TURNS: Mild impairment -- gait with horizontal head turns (2)  (2 points) 4. GAIT WITH VERTICAL HEAD TURNS: Moderate impairment -- gait with vertical head turns (1)  (1 points) 5. GAIT AND PIVOT TURN: Moderate impairment -- gait and pivot turn (1)  (1 points) 6. STEP OVER OBSTACLE: Normal -- step over obstacle (3)  (3 points) 7. GAIT WITH NARROW BASE  OF SUPPORT: Mild impairment -- gait with narrow base of support (2)  (2 points) 8. GAIT WITH EYES CLOSED: Mild impairment -- gait with eyes closed (2)  (2 points) 9. AMBULATING BACKWARDS: Mild impairment -- ambulating backwards (2)  (2 points) 10. STEPS: Normal -- up and down steps (3)  (3 points) Functional Gait Assessment: 19/30=63.3 percent.  GAIT: Distance walked: 74' Assistive device utilized: None Level of assistance: Complete Independence Comments: Mildly unsteady at times  TREATMENT DATE:  11/26/2023  Therapeutic Exercise: -Nustep 5 minutes, level 1 resistance, pt cued for not exceeding 8/10 on fatigue scale -Leg press, 2 sets of 7 reps, plate #4, pt cued for  avoidance of knee lock out and eccentric control -Sit to stands, 2 sets of 5 reps, throughout session   Neuromuscular Re-education: -Lateral step up and overs, 1 set of 5 reps, 3lb ankle weights, 8 inch step -Step up and overs, 8 inch step, 1 set of 10 reps -Aeromat walks, tandem and lateral stepping, 2 laps each variation in parallel bars, 3 pound ankle weights -Hurdle and lilly pad balance obstacle course, 6 laps, with 3 pound ankle weights, pt requires CGA but is able to manage 9 inch hurdles   11/20/23 BP:  121/69, HR 61bpm Standing:  hip abduction 10X each  Hip extension 10X each  Side stepping with RTB 20 feet 3RT  Lunge onto BOSU dome up no UE 2X10 each side 7 stair negotiation 4 steps no HR 5RT reciprocally Sit to stands no UE 2X10 Retro ambulation 20 foot 2RT Tandem gait 20 foot 2RT Hurdle step overs 2/12, 2/6 3RT each Nustep UE/LE 5 minutes level 3 beach  seat 7    11/13/2023  Therapeutic Exercise: - -Lateral stepping 3 laps 20 feet per lap, second 2 with RTB around ankles, pt cued for upright posture -Sit to stands, 2 sets of 5 reps, throughout session   Neuromuscular Re-education: -TUG -FGA -Speed step ups, 8 inch step, 30 second bout, 13 reps -Walking marches/butt kicks, 2 laps, 40 feet lap, 3 lb ankle weights, gait belt for safety -Lateral stepping with 3 lb ankle weights, 2 laps, 40 foot lap, gait belt for saftey    PATIENT EDUCATION:  Education details: PT evaluation, objective findings, POC, Importance of HEP, Precautions, Clinic policies  Person educated: Patient Education method: Explanation and Demonstration Education comprehension: verbalized understanding and returned demonstration  HOME EXERCISE PROGRAM: Access Code: [email or web address] URL: [email or web address] Date: 11/06/2023 Prepared by: Rosaria Powell-Butler  Exercises - Sit to Stand  - 2 x daily - 7 x weekly - 3 sets - 10 reps - Standing Hip Extension with Counter Support  - 2 x  daily - 7 x weekly - 3 sets - 10 reps - Side Stepping with Counter Support  - 2 x daily - 7 x weekly - 3 sets - 10 reps - Standing Tandem Balance with Counter Support  - 2 x daily - 7 x weekly - 3 sets - 30 hold - Standing Single Leg Stance with Counter Support  - 2 x daily - 7 x weekly - 3 sets - 30 hold  ASSESSMENT:  CLINICAL IMPRESSION: Patient continues to demonstrate decreased BLE strength, decreased gait quality and balance. Patient also continues to demonstrate increased fatigability with need for multiple rest breaks during today's session. Patient able to progress dynamic balance and LE activation exercises today with obstacle course and leg press, good performance with verbal cueing. Pt reports improved overall feeling following session today, educated on the beneficial  effects exercise can have on certain conditions. Patient would continue to benefit from skilled physical therapy for increased endurance with ambulation, increased LE strength, and improved balance for improved quality of life, improved independence with management of neurologic symptoms and continued progress towards therapy goals.     Patient is a 73 y.o. female who was seen today for physical therapy evaluation and treatment for G62.9 (ICD-10-CM) - Polyneuropathy, unspecified. Patient arrives to PT with relevant history of Myastenia Gravis and new polyneuropathy/peripheral neuropathy diagnosis. Patient reports her biggest issue this date is balance and endurance. On this date, patient demonstrates decreased LE strength, and deficits with static balance.  Patient educated on taking anticipatory breaks when performing her usual daily tasks to aid in decreasing chances of falls due to fatigue. Patient will benefit from skilled physical therapy in order to address strength, balance, and endurance to improve function/QOL.    OBJECTIVE IMPAIRMENTS: decreased activity tolerance, decreased balance, decreased endurance, difficulty  walking, and decreased strength.   ACTIVITY LIMITATIONS: lifting, standing, squatting, stairs, transfers, and reach over head  PARTICIPATION LIMITATIONS: meal prep, cleaning, laundry, community activity, and yard work  PERSONAL FACTORS: N/A are also affecting patient's functional outcome.   REHAB POTENTIAL: Good  CLINICAL DECISION MAKING: Stable/uncomplicated  EVALUATION COMPLEXITY: Low   GOALS: Goals reviewed with patient? No  SHORT TERM GOALS: Target date: 11/20/23 Patient will be independent with performance of HEP to demonstrate adequate self management of symptoms.  Baseline:  Goal status: INITIAL  2.   Patient will report at least a 25% improvement with function or pain overall since beginning PT. Baseline:  Goal status: INITIAL  LONG TERM GOALS: Target date: 12/18/23  Patient will improve ABC Scale by at least 10.5 points in order to demonstrate improved self-perceived function while meeting MCID (based on MCID for MS patients) Baseline:  Goal status: INITIAL  2.  Patient will maintain single leg balance for 30 seconds bilaterally in order to demonstrate safety with single leg balancing tasks such as stair navigation.  Baseline:  Goal status: INITIAL  3.  Patient will report 0 near falls within a weeks time in order to demonstrate improved risk of falls.  Baseline:  Goal status: INITIAL 4. Patient will report at least a 50% improvement with function or pain overall since beginning PT. Baseline:  Goal status: INITIAL  PLAN:  PT FREQUENCY: 2x/week  PT DURATION: 6 weeks  PLANNED INTERVENTIONS: [id number]- PT Re-evaluation, 97110-Therapeutic exercises, 97530- Therapeutic activity, [id number]- Neuromuscular re-education, 97535- Self Care, [id number]- Manual therapy, [id number]- Gait training, [phone]- Electrical stimulation (manual), [id number]- Traction (mechanical), [phone] (1-2 muscles), [id number] (3+ muscles)- Dry Needling, Patient/Family education, Balance training, Stair training, Taping, Joint  mobilization, Spinal mobilization, Cryotherapy, and Moist heat.  PLAN FOR NEXT SESSION: Progress LE strength and balance *Use RPE for fatigue rating due to Myastenia Gravis diag   Lang Ada, PT, DPT [facility] Office: [phone] 4:59 PM, 11/26/23

## 2023-11-28 ENCOUNTER — Other Ambulatory Visit: Payer: Self-pay

## 2023-11-28 ENCOUNTER — Encounter (HOSPITAL_COMMUNITY): Payer: Self-pay

## 2023-11-28 ENCOUNTER — Ambulatory Visit (HOSPITAL_COMMUNITY)

## 2023-11-28 DIAGNOSIS — Z7409 Other reduced mobility: Secondary | ICD-10-CM

## 2023-11-28 DIAGNOSIS — R2689 Other abnormalities of gait and mobility: Secondary | ICD-10-CM | POA: Diagnosis not present

## 2023-11-28 DIAGNOSIS — K5904 Chronic idiopathic constipation: Secondary | ICD-10-CM

## 2023-11-28 DIAGNOSIS — R29898 Other symptoms and signs involving the musculoskeletal system: Secondary | ICD-10-CM | POA: Diagnosis not present

## 2023-11-28 DIAGNOSIS — M542 Cervicalgia: Secondary | ICD-10-CM | POA: Diagnosis not present

## 2023-11-28 DIAGNOSIS — M6281 Muscle weakness (generalized): Secondary | ICD-10-CM | POA: Diagnosis not present

## 2023-11-28 MED ORDER — LINACLOTIDE 145 MCG PO CAPS: 145.0000 ug | ORAL_CAPSULE | Freq: Every day | ORAL | 1 refills | Status: DC

## 2023-11-28 NOTE — Telephone Encounter (Signed)
 I have already sent this in for the pt today

## 2023-11-28 NOTE — Therapy (Signed)
 OUTPATIENT PHYSICAL THERAPY THORACOLUMBAR TREATMENT   Patient Name: Mary [name] MRN: [id number] DOB:1951/02/23, 73 y.o., female Today's Date: 11/28/2023  END OF SESSION:  PT End of Session - 11/28/23 0805     Visit Number 5    Number of Visits 12    Date for PT Re-Evaluation 12/04/23    Authorization Type [employer] medicare    Authorization Time Period cohere approved 12 visits from 11/06/2023-01/05/2024    Authorization - Visit Number 4    PT Start Time 0805    PT Stop Time 0843    PT Time Calculation (min) 38 min    Activity Tolerance Patient tolerated treatment well    Behavior During Therapy WFL for tasks assessed/performed             Past Medical History:  Diagnosis Date   Anemia    Arthritis    Chronic kidney disease    idiopathic micro hematuria    Complication of anesthesia    patient states does not take much medicine to put her to sleep    Edema    ankles to knees    GERD (gastroesophageal reflux disease)    Headache(784.0)    hx of migraines    Hyperlipidemia    Mitral valve prolapse    no symptoms    Myasthenia gravis (HCC)    PONV (postoperative nausea and vomiting)    Scalp alopecia    Seizures (HCC)    1 seizure 45 years ago; unknown etiology and no meds, no seizures since then.   Past Surgical History:  Procedure Laterality Date   BIOPSY  03/24/2020   Procedure: BIOPSY;  Surgeon: Cindie Carlin POUR, DO;  Location: [facility];  Service: Endoscopy;;  gastric duodenum   CERVICAL FUSION  2006   cervical 5-6    COLONOSCOPY N/A 03/03/2017   Procedure: COLONOSCOPY;  Surgeon: Harvey Margo CROME, MD;  Location: [facility];  Service: Endoscopy;  Laterality: N/A;  1:15 pm   COLONOSCOPY WITH PROPOFOL  N/A 03/24/2020   Procedure: COLONOSCOPY WITH PROPOFOL ;  Surgeon: Cindie Carlin POUR, DO;  Location: [facility];  Service: Endoscopy;  Laterality: N/A;  10:00am   COLONOSCOPY WITH PROPOFOL  N/A 10/02/2020   Procedure: COLONOSCOPY WITH PROPOFOL ;   Surgeon: Cindie Carlin POUR, DO;  Location: [facility];  Service: Endoscopy;  Laterality: N/A;  ASA II / 9:00   ESOPHAGOGASTRODUODENOSCOPY (EGD) WITH PROPOFOL  N/A 03/24/2020   Procedure: ESOPHAGOGASTRODUODENOSCOPY (EGD) WITH PROPOFOL ;  Surgeon: Cindie Carlin POUR, DO;  Location: [facility];  Service: Endoscopy;  Laterality: N/A;   HEMORRHOID SURGERY N/A 06/23/2017   Procedure: ANOSCOPY WITH REMOVAL OF SINGLE LESION;  Surgeon: Mavis Anes, MD;  Location: [facility];  Service: General;  Laterality: N/A;   MYOCARDIAL PERFUSION STUDY  07/02/2011   NO SCINTIGRAPHIC EVIDENCE OF INDUCIBLE MYOCARDIAL ISCHEMIA. POST-STRESS EF IS 87%.EXERCISE CAPACITY 9 METS. EKG SHOWS NSR AT 69. THERE IS HORIZONTAL ST DEPRESSION WITH EXERCISE IN II, III, AVF SEEN BEST AT EARLY RECOVERY. THIS IS PRESENT IN V3-V5 AS WELL AND RETURNS TO BASELINE AT THE END OF THE TEST. NORMAL STUDY.   POLYPECTOMY  03/24/2020   Procedure: POLYPECTOMY;  Surgeon: Cindie Carlin POUR, DO;  [facility]: [facility];  Service: Endoscopy;;  colon    POLYPECTOMY  10/02/2020   Procedure: POLYPECTOMY;  Surgeon: Cindie Carlin POUR, DO;  [facility]: [facility];  Service: Endoscopy;;   RENAL ARTERY DOPPLER  12/28/2003   CELIAC ARTERY: AT REST, 168.4 CM/S; INSPRIATION, 177.6  CM/S. ARCUATE LIGAMENT COMPRESSION SYNDROME. R & L KIDNEYS: RIGHT KIDNEY MEASURES SOME WHAT SMALLER THAN LEFT. KIDNEY ARE ESSENTIALLY SYMMETRICAL IN SHAPE WITH NO OBVIOUS ABNORMALITY VISUALIZED. R & L RENAL ARTERIES: DEMONSTRATE NORMAL SPECTRA . NO SUGGESTION OF DIAMETER REDUCTION, DISSECTION, FIBROMUSCULAR DYSPLASIA OR ANY OTHER VASCULAR ABNOR   TOTAL KNEE ARTHROPLASTY Right 08/31/2013   Procedure: RIGHT TOTAL KNEE ARTHROPLASTY;  Surgeon: Donnice JONETTA Car, MD;  Location: [facility];  Service: Orthopedics;  Laterality: Right;   TOTAL KNEE ARTHROPLASTY Left 03/07/2015   Procedure: TOTAL KNEE ARTHROPLASTY;  Surgeon: Donnice Car, MD;  Location: [facility];  Service: Orthopedics;  Laterality: Left;    TRANSTHORACIC ECHOCARDIOGRAM  07/02/2011   LV SYSTOLIC FUNCTION NORMAL, EF=>55%, INSIGNIFICANT PERICARDIAL EFFUSION, LEFT ATRIAL SIZE IS NORMAL, RV SYSTOLIC PRESSURE IS NORMAL. NO SIGN VALVULAR HEART DISEASE.   TUBAL LIGATION  1986   veins stripping      bilateral legs    Patient Active Problem List   Diagnosis Date Noted   Chronic rhinitis 11/18/2023   Hypertrophy of nasal turbinates 11/18/2023   Postnasal drip 11/18/2023   Neck pain 03/13/2023   Gait abnormality 01/23/2023   Diplopia 01/23/2023   Gastritis 04/12/2020   Serrated polyp of colon 04/12/2020   Constipation 02/23/2020   Melena 02/23/2020   Esophageal reflux 02/23/2020   Condyloma acuminatum    Encounter for colonoscopy due to history of adenomatous colonic polyps    Overweight (BMI 25.0-29.9) 03/09/2015   S/P left TKA 03/07/2015   Palpitations 05/23/2014   Expected blood loss anemia 09/01/2013   S/P right TKA 08/31/2013   DJD (degenerative joint disease) of knee 08/31/2013   Pre-operative clearance, cardiac 08/17/2013   HTN (hypertension) 05/20/2013   Hyperlipidemia 05/20/2013   Migraine 02/23/2007   ALLERGIC RHINITIS 02/23/2007   TRACHEOBRONCHITIS 02/23/2007   COUGH 02/23/2007    PCP: Bertell Satterfield, MD  REFERRING PROVIDER: Charmel Ivan Hamming, MD  REFERRING DIAG: G62.9 (ICD-10-CM) - Polyneuropathy, unspecified  Rationale for Evaluation and Treatment: Rehabilitation  THERAPY DIAG:  Impairment of balance  Leg weakness, bilateral  Impaired functional mobility, balance, gait, and endurance  ONSET DATE: 4-5 Months   SUBJECTIVE:                                                                                                                                                                                           SUBJECTIVE STATEMENT: Pt feels energetic this morning, fatigue level 5/10.  No reports of recent fall in over a month.  Patient reports she's noticed she has tinging in feet, toes, that  travels up to knees at times. Reports it makes her feel wobbly at times.  Reports she has tried massage to her feet and LE and reports it may be helping some but not much. Harder to maintain balance in dark when she gets up in the middle of the night.   PERTINENT HISTORY:  Myastenia Gravis 2 TKR  PAIN:  Are you having pain? Yes: NPRS scale: 2/10 foot, 3/10 headache (constant) Pain location: Bottom of R foot, headache/neck pain Pain description: Achy, N/T Aggravating factors: Walking  Relieving factors: Rest  PRECAUTIONS: None  RED FLAGS: Neuropathy    WEIGHT BEARING RESTRICTIONS: No  FALLS:  Has patient fallen in last 6 months? Yes. Number of falls 1. A lot of near falls. On uneven surfaces.   LIVING ENVIRONMENT: Lives with: lives alone Stairs: Yes: Internal: 12 steps; on right going up and External: 6 steps; can reach both Has following equipment at home: None Has RW and SPC  OCCUPATION: Retired, Pension scheme manager  PLOF: Independent  PATIENT GOALS: To increase stability and see if we can figure out how to get Neuropathy pain from getting worse  NEXT MD VISIT: October 17th, 2025  OBJECTIVE:  Note: Objective measures were completed at Evaluation unless otherwise noted.  DIAGNOSTIC FINDINGS:    PATIENT SURVEYS:  Total ABC score: 1300 / 1600 = 81.3 %  COGNITION: Overall cognitive status: Within functional limits for tasks assessed     SENSATION: WFL   POSTURE: rounded shoulders, forward head, and increased thoracic kyphosis  PALPATION:   LUMBAR ROM:   AROM eval  Flexion   Extension   Right lateral flexion   Left lateral flexion   Right rotation   Left rotation    (Blank rows = not tested)  LOWER EXTREMITY ROM:     Active  Right eval Left eval  Hip flexion    Hip extension    Hip abduction    Hip adduction    Hip internal rotation    Hip external rotation    Knee flexion    Knee extension    Ankle dorsiflexion    Ankle  plantarflexion    Ankle inversion    Ankle eversion     (Blank rows = not tested)  LOWER EXTREMITY MMT:    MMT Right eval Left eval  Hip flexion 4- 4-  Hip extension 3+ 3+  Hip abduction 4- 4-  Hip adduction    Hip internal rotation    Hip external rotation    Knee flexion 3+ 3+  Knee extension 4- 4-  Ankle dorsiflexion 5 5  Ankle plantarflexion    Ankle inversion    Ankle eversion     (Blank rows = not tested)  LUMBAR SPECIAL TESTS:    FUNCTIONAL TESTS:  4 Stage Balance: Side by side: 30 Semi-tandem: 30 ea LE leading Tandem:  30 ea LE leading  SLS: L- 22 R- 11  11/13/23: TUG: 7.58 seconds Functional Gait Assessment Summary 1. GAIT LEVEL SURFACE: Moderate impairment -- gait level surface (1)  (1 points) 2. CHANGE IN GAIT SPEED: Mild impairment -- change in gait speed (2)  (2 points) 3. GAIT WITH HORIZONTAL HEAD TURNS: Mild impairment -- gait with horizontal head turns (2)  (2 points) 4. GAIT WITH VERTICAL HEAD TURNS: Moderate impairment -- gait with vertical head turns (1)  (1 points) 5. GAIT AND PIVOT TURN: Moderate impairment -- gait and pivot turn (1)  (1 points) 6. STEP OVER OBSTACLE: Normal -- step over obstacle (3)  (3 points) 7. GAIT WITH NARROW BASE OF SUPPORT: Mild impairment --  gait with narrow base of support (2)  (2 points) 8. GAIT WITH EYES CLOSED: Mild impairment -- gait with eyes closed (2)  (2 points) 9. AMBULATING BACKWARDS: Mild impairment -- ambulating backwards (2)  (2 points) 10. STEPS: Normal -- up and down steps (3)  (3 points) Functional Gait Assessment: 19/30=63.3 percent.  GAIT: Distance walked: 77' Assistive device utilized: None Level of assistance: Complete Independence Comments: Mildly unsteady at times  TREATMENT DATE:  11/28/23: - Nustep [place] L1 x 5 min, average SPM 62 - Sit to stand with green weighted ball rebounded with trampoline - Balance beam down hallway 2RT on Aeromat tandem gait and  lateral stepping - Obstacle course with 6 and 12 in hurdles, SLS with foot on cone, SLS on foam 3# on ankle - Gait holding green weighted ball in cone x 219ft with head turns  11/26/2023  Therapeutic Exercise: -Nustep 5 minutes, level 1 resistance, pt cued for not exceeding 8/10 on fatigue scale -Leg press, 2 sets of 7 reps, plate #4, pt cued for avoidance of knee lock out and eccentric control -Sit to stands, 2 sets of 5 reps, throughout session   Neuromuscular Re-education: -Lateral step up and overs, 1 set of 5 reps, 3lb ankle weights, 8 inch step -Step up and overs, 8 inch step, 1 set of 10 reps -Aeromat walks, tandem and lateral stepping, 2 laps each variation in parallel bars, 3 pound ankle weights -Hurdle and lilly pad balance obstacle course, 6 laps, with 3 pound ankle weights, pt requires CGA but is able to manage 9 inch hurdles   11/20/23 BP:  121/69, HR 61bpm Standing:  hip abduction 10X each  Hip extension 10X each  Side stepping with RTB 20 feet 3RT  Lunge onto BOSU dome up no UE 2X10 each side 7 stair negotiation 4 steps no HR 5RT reciprocally Sit to stands no UE 2X10 Retro ambulation 20 foot 2RT Tandem gait 20 foot 2RT Hurdle step overs 2/12, 2/6 3RT each Nustep UE/LE 5 minutes level 3 beach  seat 7    11/13/2023  Therapeutic Exercise: - -Lateral stepping 3 laps 20 feet per lap, second 2 with RTB around ankles, pt cued for upright posture -Sit to stands, 2 sets of 5 reps, throughout session   Neuromuscular Re-education: -TUG -FGA -Speed step ups, 8 inch step, 30 second bout, 13 reps -Walking marches/butt kicks, 2 laps, 40 feet lap, 3 lb ankle weights, gait belt for safety -Lateral stepping with 3 lb ankle weights, 2 laps, 40 foot lap, gait belt for saftey    PATIENT EDUCATION:  Education details: PT evaluation, objective findings, POC, Importance of HEP, Precautions, Clinic policies  Person educated: Patient Education method: Explanation and  Demonstration Education comprehension: verbalized understanding and returned demonstration  HOME EXERCISE PROGRAM: Access Code: [email or web address] URL: [email or web address] Date: 11/06/2023 Prepared by: Rosaria Powell-Butler  Exercises - Sit to Stand  - 2 x daily - 7 x weekly - 3 sets - 10 reps - Standing Hip Extension with Counter Support  - 2 x daily - 7 x weekly - 3 sets - 10 reps - Side Stepping with Counter Support  - 2 x daily - 7 x weekly - 3 sets - 10 reps - Standing Tandem Balance with Counter Support  - 2 x daily - 7 x weekly - 3 sets - 30 hold - Standing Single Leg Stance with Counter Support  - 2 x daily - 7 x weekly - 3 sets - 30 hold  ASSESSMENT:  CLINICAL IMPRESSION: Session focus with LE strengthening and balance training while monitoring fatigue levels.  Pt continues to demonstrate easy increased fatigue with need for multiple rest breaks through today's session.  Added STS paired with rebounder for dynamic gluteal strengthening, reaction time and balance.  Cueing to slow down to improve balance with balance beam activities with CGA for safety.  Average RPE fatigue levels through session range from 6-7/10 with 5/10 initial fatigue level   Patient is a 73 y.o. female who was seen today for physical therapy evaluation and treatment for G62.9 (ICD-10-CM) - Polyneuropathy, unspecified. Patient arrives to PT with relevant history of Myastenia Gravis and new polyneuropathy/peripheral neuropathy diagnosis. Patient reports her biggest issue this date is balance and endurance. On this date, patient demonstrates decreased LE strength, and deficits with static balance.  Patient educated on taking anticipatory breaks when performing her usual daily tasks to aid in decreasing chances of falls due to fatigue. Patient will benefit from skilled physical therapy in order to address strength, balance, and endurance to improve function/QOL.    OBJECTIVE IMPAIRMENTS: decreased activity  tolerance, decreased balance, decreased endurance, difficulty walking, and decreased strength.   ACTIVITY LIMITATIONS: lifting, standing, squatting, stairs, transfers, and reach over head  PARTICIPATION LIMITATIONS: meal prep, cleaning, laundry, community activity, and yard work  PERSONAL FACTORS: N/A are also affecting patient's functional outcome.   REHAB POTENTIAL: Good  CLINICAL DECISION MAKING: Stable/uncomplicated  EVALUATION COMPLEXITY: Low   GOALS: Goals reviewed with patient? No  SHORT TERM GOALS: Target date: 11/20/23 Patient will be independent with performance of HEP to demonstrate adequate self management of symptoms.  Baseline:  Goal status: INITIAL  2.   Patient will report at least a 25% improvement with function or pain overall since beginning PT. Baseline:  Goal status: INITIAL  LONG TERM GOALS: Target date: 12/18/23  Patient will improve ABC Scale by at least 10.5 points in order to demonstrate improved self-perceived function while meeting MCID (based on MCID for MS patients) Baseline:  Goal status: INITIAL  2.  Patient will maintain single leg balance for 30 seconds bilaterally in order to demonstrate safety with single leg balancing tasks such as stair navigation.  Baseline:  Goal status: INITIAL  3.  Patient will report 0 near falls within a weeks time in order to demonstrate improved risk of falls.  Baseline:  Goal status: INITIAL 4. Patient will report at least a 50% improvement with function or pain overall since beginning PT. Baseline:  Goal status: INITIAL  PLAN:  PT FREQUENCY: 2x/week  PT DURATION: 6 weeks  PLANNED INTERVENTIONS: [id number]- PT Re-evaluation, 97110-Therapeutic exercises, 97530- Therapeutic activity, [id number]- Neuromuscular re-education, 97535- Self Care, [id number]- Manual therapy, [id number]- Gait training, [phone]- Electrical stimulation (manual), [id number]- Traction (mechanical), [phone] (1-2 muscles), [id number] (3+ muscles)- Dry Needling,  Patient/Family education, Balance training, Stair training, Taping, Joint mobilization, Spinal mobilization, Cryotherapy, and Moist heat.  PLAN FOR NEXT SESSION: Progress LE strength and balance *Use RPE for fatigue rating due to Myastenia Gravis diag   Augustin Mclean, LPTA/CLT; CBIS [phone]  9:18 AM, 11/28/23

## 2023-12-03 ENCOUNTER — Ambulatory Visit: Admitting: Internal Medicine

## 2023-12-09 ENCOUNTER — Ambulatory Visit (HOSPITAL_COMMUNITY)

## 2023-12-09 ENCOUNTER — Encounter (HOSPITAL_COMMUNITY): Payer: Self-pay

## 2023-12-09 DIAGNOSIS — R2689 Other abnormalities of gait and mobility: Secondary | ICD-10-CM

## 2023-12-09 DIAGNOSIS — M6281 Muscle weakness (generalized): Secondary | ICD-10-CM | POA: Diagnosis not present

## 2023-12-09 DIAGNOSIS — Z7409 Other reduced mobility: Secondary | ICD-10-CM

## 2023-12-09 DIAGNOSIS — R29898 Other symptoms and signs involving the musculoskeletal system: Secondary | ICD-10-CM

## 2023-12-09 DIAGNOSIS — M542 Cervicalgia: Secondary | ICD-10-CM | POA: Diagnosis not present

## 2023-12-09 NOTE — Therapy (Addendum)
 OUTPATIENT PHYSICAL THERAPY THORACOLUMBAR TREATMENT Progress Note Reporting Period 11/06/23 to 12/09/23  See note below for Objective Data and Assessment of Progress/Goals.      Patient Name: Mary [name] MRN: [id number] DOB:Aug 02, 1950, 73 y.o., female Today's Date: 12/09/2023  END OF SESSION:  PT End of Session - 12/09/23 1354     Visit Number 6    Number of Visits 12    Date for PT Re-Evaluation 12/30/23    Authorization Type [employer] medicare    Authorization Time Period cohere approved 12 visits from 11/06/2023-01/05/2024    Authorization - Visit Number 5    PT Start Time 1354    PT Stop Time 1432    PT Time Calculation (min) 38 min    Activity Tolerance Patient tolerated treatment well    Behavior During Therapy WFL for tasks assessed/performed             Past Medical History:  Diagnosis Date   Anemia    Arthritis    Chronic kidney disease    idiopathic micro hematuria    Complication of anesthesia    patient states does not take much medicine to put her to sleep    Edema    ankles to knees    GERD (gastroesophageal reflux disease)    Headache(784.0)    hx of migraines    Hyperlipidemia    Mitral valve prolapse    no symptoms    Myasthenia gravis (HCC)    PONV (postoperative nausea and vomiting)    Scalp alopecia    Seizures (HCC)    1 seizure 45 years ago; unknown etiology and no meds, no seizures since then.   Past Surgical History:  Procedure Laterality Date   BIOPSY  03/24/2020   Procedure: BIOPSY;  Surgeon: Cindie Carlin POUR, DO;  Location: [facility];  Service: Endoscopy;;  gastric duodenum   CERVICAL FUSION  2006   cervical 5-6    COLONOSCOPY N/A 03/03/2017   Procedure: COLONOSCOPY;  Surgeon: Harvey Margo CROME, MD;  Location: [facility];  Service: Endoscopy;  Laterality: N/A;  1:15 pm   COLONOSCOPY WITH PROPOFOL  N/A 03/24/2020   Procedure: COLONOSCOPY WITH PROPOFOL ;  Surgeon: Cindie Carlin POUR, DO;  Location: [facility];  Service:  Endoscopy;  Laterality: N/A;  10:00am   COLONOSCOPY WITH PROPOFOL  N/A 10/02/2020   Procedure: COLONOSCOPY WITH PROPOFOL ;  Surgeon: Cindie Carlin POUR, DO;  [facility]: [facility];  Service: Endoscopy;  Laterality: N/A;  ASA II / 9:00   ESOPHAGOGASTRODUODENOSCOPY (EGD) WITH PROPOFOL  N/A 03/24/2020   Procedure: ESOPHAGOGASTRODUODENOSCOPY (EGD) WITH PROPOFOL ;  Surgeon: Cindie Carlin POUR, DO;  [facility]: [facility];  Service: Endoscopy;  Laterality: N/A;   HEMORRHOID SURGERY N/A 06/23/2017   Procedure: ANOSCOPY WITH REMOVAL OF SINGLE LESION;  Surgeon: Mavis Anes, MD;  Location: [facility];  Service: General;  Laterality: N/A;   MYOCARDIAL PERFUSION STUDY  07/02/2011   NO SCINTIGRAPHIC EVIDENCE OF INDUCIBLE MYOCARDIAL ISCHEMIA. POST-STRESS EF IS 87%.EXERCISE CAPACITY 9 METS. EKG SHOWS NSR AT 69. THERE IS HORIZONTAL ST DEPRESSION WITH EXERCISE IN II, III, AVF SEEN BEST AT EARLY RECOVERY. THIS IS PRESENT IN V3-V5 AS WELL AND RETURNS TO BASELINE AT THE END OF THE TEST. NORMAL STUDY.   POLYPECTOMY  03/24/2020   Procedure: POLYPECTOMY;  Surgeon: Cindie Carlin POUR, DO;  [facility]: [facility];  Service: Endoscopy;;  colon    POLYPECTOMY  10/02/2020   Procedure: POLYPECTOMY;  Surgeon: Cindie Carlin POUR, DO;  [facility];  Service: Endoscopy;;   RENAL ARTERY DOPPLER  12/28/2003   CELIAC ARTERY: AT REST, 168.4 CM/S; INSPRIATION, 177.6 CM/S. ARCUATE LIGAMENT COMPRESSION SYNDROME. R & L KIDNEYS: RIGHT KIDNEY MEASURES SOME WHAT SMALLER THAN LEFT. KIDNEY ARE ESSENTIALLY SYMMETRICAL IN SHAPE WITH NO OBVIOUS ABNORMALITY VISUALIZED. R & L RENAL ARTERIES: DEMONSTRATE NORMAL SPECTRA . NO SUGGESTION OF DIAMETER REDUCTION, DISSECTION, FIBROMUSCULAR DYSPLASIA OR ANY OTHER VASCULAR ABNOR   TOTAL KNEE ARTHROPLASTY Right 08/31/2013   Procedure: RIGHT TOTAL KNEE ARTHROPLASTY;  Surgeon: Donnice JONETTA Car, MD;  Location: [facility];  Service: Orthopedics;  Laterality: Right;   TOTAL KNEE ARTHROPLASTY Left 03/07/2015    Procedure: TOTAL KNEE ARTHROPLASTY;  Surgeon: Donnice Car, MD;  Location: [facility];  Service: Orthopedics;  Laterality: Left;   TRANSTHORACIC ECHOCARDIOGRAM  07/02/2011   LV SYSTOLIC FUNCTION NORMAL, EF=>55%, INSIGNIFICANT PERICARDIAL EFFUSION, LEFT ATRIAL SIZE IS NORMAL, RV SYSTOLIC PRESSURE IS NORMAL. NO SIGN VALVULAR HEART DISEASE.   TUBAL LIGATION  1986   veins stripping      bilateral legs    Patient Active Problem List   Diagnosis Date Noted   Chronic rhinitis 11/18/2023   Hypertrophy of nasal turbinates 11/18/2023   Postnasal drip 11/18/2023   Neck pain 03/13/2023   Gait abnormality 01/23/2023   Diplopia 01/23/2023   Gastritis 04/12/2020   Serrated polyp of colon 04/12/2020   Constipation 02/23/2020   Melena 02/23/2020   Esophageal reflux 02/23/2020   Condyloma acuminatum    Encounter for colonoscopy due to history of adenomatous colonic polyps    Overweight (BMI 25.0-29.9) 03/09/2015   S/P left TKA 03/07/2015   Palpitations 05/23/2014   Expected blood loss anemia 09/01/2013   S/P right TKA 08/31/2013   DJD (degenerative joint disease) of knee 08/31/2013   Pre-operative clearance, cardiac 08/17/2013   HTN (hypertension) 05/20/2013   Hyperlipidemia 05/20/2013   Migraine 02/23/2007   ALLERGIC RHINITIS 02/23/2007   TRACHEOBRONCHITIS 02/23/2007   COUGH 02/23/2007    PCP: Bertell Satterfield, MD  REFERRING PROVIDER: Charmel Ivan Hamming, MD  REFERRING DIAG: G62.9 (ICD-10-CM) - Polyneuropathy, unspecified  Rationale for Evaluation and Treatment: Rehabilitation  THERAPY DIAG:  Impairment of balance  Leg weakness, bilateral  Impaired functional mobility, balance, gait, and endurance  ONSET DATE: 4-5 Months   SUBJECTIVE:                                                                                                                                                                                           SUBJECTIVE STATEMENT: Pt running late for session, stated  she was doing exercises and lost track of time.  Fatigue levels at 3-4/10 today.  Feels  she has made improvements with therapy, feels strength and balance are improving.  Feels improvements 10-15% since beginning therapy.    Patient reports she's noticed she has tinging in feet, toes, that travels up to knees at times. Reports it makes her feel wobbly at times. Reports she has tried massage to her feet and LE and reports it may be helping some but not much. Harder to maintain balance in dark when she gets up in the middle of the night.   PERTINENT HISTORY:  Myastenia Gravis 2 TKR  PAIN:  Are you having pain? Yes: NPRS scale: 2/10 foot, 3/10 headache (constant) Pain location: Bottom of R foot, headache/neck pain Pain description: Achy, N/T Aggravating factors: Walking  Relieving factors: Rest  PRECAUTIONS: None  RED FLAGS: Neuropathy    WEIGHT BEARING RESTRICTIONS: No  FALLS:  Has patient fallen in last 6 months? Yes. Number of falls 1. A lot of near falls. On uneven surfaces.   LIVING ENVIRONMENT: Lives with: lives alone Stairs: Yes: Internal: 12 steps; on right going up and External: 6 steps; can reach both Has following equipment at home: None Has RW and SPC  OCCUPATION: Retired, Pension scheme manager  PLOF: Independent  PATIENT GOALS: To increase stability and see if we can figure out how to get Neuropathy pain from getting worse  NEXT MD VISIT: October 17th, 2025  OBJECTIVE:  Note: Objective measures were completed at Evaluation unless otherwise noted.  DIAGNOSTIC FINDINGS:    PATIENT SURVEYS:  Total ABC score: 1300 / 1600 = 81.3 % 12/09/23: ABC score: 1350/1600= 84.375  COGNITION: Overall cognitive status: Within functional limits for tasks assessed     SENSATION: WFL   POSTURE: rounded shoulders, forward head, and increased thoracic kyphosis  PALPATION:   LUMBAR ROM:   AROM eval  Flexion   Extension   Right lateral flexion   Left lateral  flexion   Right rotation   Left rotation    (Blank rows = not tested)  LOWER EXTREMITY ROM:     Active  Right eval Left eval  Hip flexion    Hip extension    Hip abduction    Hip adduction    Hip internal rotation    Hip external rotation    Knee flexion    Knee extension    Ankle dorsiflexion    Ankle plantarflexion    Ankle inversion    Ankle eversion     (Blank rows = not tested)  LOWER EXTREMITY MMT:    MMT Right eval Left eval Right 12/09/23 Left  12/09/23  Hip flexion 4- 4- 4 4  Hip extension 3+ 3+ 4- 4+  Hip abduction 4- 4- 4- 4  Hip adduction      Hip internal rotation      Hip external rotation      Knee flexion 3+ 3+ 4- 4  Knee extension 4- 4- 4+ 5  Ankle dorsiflexion 5 5 5 5   Ankle plantarflexion      Ankle inversion      Ankle eversion       (Blank rows = not tested)  LUMBAR SPECIAL TESTS:    FUNCTIONAL TESTS:  4 Stage Balance: Side by side: 30 Semi-tandem: 30 ea LE leading Tandem:  30 ea LE leading  SLS: L- 22 R- 11  11/13/23: TUG: 7.58 seconds Functional Gait Assessment Summary 1. GAIT LEVEL SURFACE: Moderate impairment -- gait level surface (1)  (1 points) 2. CHANGE IN GAIT SPEED: Mild impairment -- change in  gait speed (2)  (2 points) 3. GAIT WITH HORIZONTAL HEAD TURNS: Mild impairment -- gait with horizontal head turns (2)  (2 points) 4. GAIT WITH VERTICAL HEAD TURNS: Moderate impairment -- gait with vertical head turns (1)  (1 points) 5. GAIT AND PIVOT TURN: Moderate impairment -- gait and pivot turn (1)  (1 points) 6. STEP OVER OBSTACLE: Normal -- step over obstacle (3)  (3 points) 7. GAIT WITH NARROW BASE OF SUPPORT: Mild impairment -- gait with narrow base of support (2)  (2 points) 8. GAIT WITH EYES CLOSED: Mild impairment -- gait with eyes closed (2)  (2 points) 9. AMBULATING BACKWARDS: Mild impairment -- ambulating backwards (2)  (2 points) 10. STEPS: Normal -- up and down steps (3)  (3  points) Functional Gait Assessment: 19/30=63.3 percent.  GAIT: Distance walked: 34' Assistive device utilized: None Level of assistance: Complete Independence Comments: Mildly unsteady at times  TREATMENT DATE:  12/09/23:   423ft Reviewed goals, feels she has improved by 10-15% MMT see above ABC 1350/1600= 84.375 was 81.3 FUNCTIONAL GAIT ASSESSMENT  Date: 12/09/23 Score  GAIT LEVEL SURFACE Instructions: Walk at your normal speed from here to the next mark (6 m) [20 ft]. (3) Normal - Walks 6 m (20 ft) in less than 5.5 seconds, no assistive devices, good speed, no evidence for imbalance, normal gait pattern, deviates no more than 15.24 cm (6 in) outside of the 30.48-cm (12-in) walkway width.  2.   CHANGE IN GAIT SPEED Instructions: Begin walking at your normal pace (for 1.5 m [5 ft]). When I tell you "go," walk as fast as you can (for 1.5 m [5 ft]). When I tell you "slow," walk as slowly as you can (for 1.5 m [5 ft]. (2) Mild impairment - Is able to change speed but demonstrates mild gait deviations, deviates 15.24 -25.4 cm (6 -10 in) outside of the 30.48-cm (12-in) walkway width, or no gait deviations but unable to achieve a significant change in velocity, or uses an assistive device  3.    GAIT WITH HORIZONTAL HEAD TURNS Instructions: Walk from here to the next mark 6 m (20 ft) away. Begin walking at your normal pace. Keep walking straight; after 3 steps, turn your head to the right and keep walking straight while looking to the right. After 3 more steps, turn your head to the left and keep walking straight while looking left. Continue alternating looking right and left. (2) Mild impairment - Performs head turns smoothly with slight change in gait velocity (eg, minor disruption to smooth gait path), deviates 15.24 -25.4 cm (6 -10 in) outside 30.48-cm (12-in) walkway width, or uses an assistive device.  4.   GAIT WITH VERTICAL HEAD TURNS Instructions: Walk from here to the next mark (6 m  [20 ft]). Begin walking at your normal pace. Keep walking straight; after 3 steps, tip your head up and keep walking straight while looking up. After 3 more steps, tip your head down, keep walking straight while looking down. Continue  alternating looking up and down every 3 steps until you have completed 2 repetitions in each direction. (3) Normal - Performs head turns with no change in gait. Deviates no more than 15.24 cm (6 in) outside 30.48-cm (12-in) walkway width.  5.  GAIT AND PIVOT TURN Instructions: Begin with walking at your normal pace. When I tell you, "turn and stop," turn as quickly as you can to face the opposite direction and stop. (3) Normal - Pivot turns safely  within 3 seconds and stops quickly with no loss of balance  6.   STEP OVER OBSTACLE Instructions: Begin walking at your normal speed. When you come to the shoe box, step over it, not around it, and keep walking. (3) Normal - Is able to step over 2 stacked shoe boxes taped together (22.86 cm [9 in] total height) without changing gait speed; no evidence of imbalance.  7.   GAIT WITH NARROW BASE OF SUPPORT Instructions: Walk on the floor with arms folded across the chest, feet aligned heel to toe in tandem for a distance of 3.6 m [12 ft]. The number of steps taken in a straight line are counted for a maximum of 10 steps. (2) Mild impairment - Ambulates 7-9 steps  8.   GAIT WITH EYES CLOSED Instructions: Walk at your normal speed from here to the next mark (6 m [20 ft]) with your eyes closed. (1) Moderate impairment - Walks 6 m (20 ft), slow speed, abnormal gait pattern, evidence for imbalance, deviates 25.4 -38.1 cm (10 -15 in) outside 30.48-cm (12-in) walkway width. Requires more than 9 seconds to ambulate 6 m (20 ft).  9.   AMBULATING BACKWARDS Instructions: Walk backwards until I tell you to stop (2) Mild impairment - Walks 6 m (20 ft), uses assistive device, slower speed, mild gait deviations, deviates 15.24 -25.4 cm (6 -10 in)  outside 30.48-cm (12-in) walkway width  10. STEPS Instructions: Walk up these stairs as you would at home (ie, using the rail if necessary). At the top turn around and walk down. (3) Normal-Alternating feet, no rail.  Total 24/30     11/28/23: - Nustep [place] L1 x 5 min, average SPM 62 - Sit to stand with green weighted ball rebounded with trampoline - Balance beam down hallway 2RT on Aeromat tandem gait and lateral stepping - Obstacle course with 6 and 12 in hurdles, SLS with foot on cone, SLS on foam 3# on ankle - Gait holding green weighted ball in cone x 29ft with head turns  11/26/2023  Therapeutic Exercise: -Nustep 5 minutes, level 1 resistance, pt cued for not exceeding 8/10 on fatigue scale -Leg press, 2 sets of 7 reps, plate #4, pt cued for avoidance of knee lock out and eccentric control -Sit to stands, 2 sets of 5 reps, throughout session   Neuromuscular Re-education: -Lateral step up and overs, 1 set of 5 reps, 3lb ankle weights, 8 inch step -Step up and overs, 8 inch step, 1 set of 10 reps -Aeromat walks, tandem and lateral stepping, 2 laps each variation in parallel bars, 3 pound ankle weights -Hurdle and lilly pad balance obstacle course, 6 laps, with 3 pound ankle weights, pt requires CGA but is able to manage 9 inch hurdles   11/20/23 BP:  121/69, HR 61bpm Standing:  hip abduction 10X each  Hip extension 10X each  Side stepping with RTB 20 feet 3RT  Lunge onto BOSU dome up no UE 2X10 each side 7 stair negotiation 4 steps no HR 5RT reciprocally Sit to stands no UE 2X10 Retro ambulation 20 foot 2RT Tandem gait 20 foot 2RT Hurdle step overs 2/12, 2/6 3RT each Nustep UE/LE 5 minutes level 3 beach  seat 7    11/13/2023  Therapeutic Exercise: - -Lateral stepping 3 laps 20 feet per lap, second 2 with RTB around ankles, pt cued for upright posture -Sit to stands, 2 sets of 5 reps, throughout session   Neuromuscular Re-education: -TUG -FGA -Speed step  ups,  8 inch step, 30 second bout, 13 reps -Walking marches/butt kicks, 2 laps, 40 feet lap, 3 lb ankle weights, gait belt for safety -Lateral stepping with 3 lb ankle weights, 2 laps, 40 foot lap, gait belt for saftey    PATIENT EDUCATION:  Education details: PT evaluation, objective findings, POC, Importance of HEP, Precautions, Clinic policies  Person educated: Patient Education method: Explanation and Demonstration Education comprehension: verbalized understanding and returned demonstration  HOME EXERCISE PROGRAM: Access Code: [email or web address] URL: [email or web address] Date: 11/06/2023 Prepared by: Rosaria Powell-Butler  Exercises - Sit to Stand  - 2 x daily - 7 x weekly - 3 sets - 10 reps - Standing Hip Extension with Counter Support  - 2 x daily - 7 x weekly - 3 sets - 10 reps - Side Stepping with Counter Support  - 2 x daily - 7 x weekly - 3 sets - 10 reps - Standing Tandem Balance with Counter Support  - 2 x daily - 7 x weekly - 3 sets - 30 hold - Standing Single Leg Stance with Counter Support  - 2 x daily - 7 x weekly - 3 sets - 30 hold  ASSESSMENT:  CLINICAL IMPRESSION: Reviewed goals with the following findings:  Pt has met 1/2 STGs and progressing towards all LTGs.  Pt reports compliance with HEP daily and able to recall current exercise program.  Presents with improvements FGA testing, strength is progressing and feels she has improved by 10-15% since beginning therapy and improve balance confidence with ABC score by 3%.  Pt will continue to benefit from skilled PT to address goals unmet.  Fatigue levels stayed 3-4/10 throughout session.  Patient is a 73 y.o. female who was seen today for physical therapy evaluation and treatment for G62.9 (ICD-10-CM) - Polyneuropathy, unspecified. Patient arrives to PT with relevant history of Myastenia Gravis and new polyneuropathy/peripheral neuropathy diagnosis. Patient reports her biggest issue this date is balance and endurance.  On this date, patient demonstrates decreased LE strength, and deficits with static balance.  Patient educated on taking anticipatory breaks when performing her usual daily tasks to aid in decreasing chances of falls due to fatigue. Patient will benefit from skilled physical therapy in order to address strength, balance, and endurance to improve function/QOL.    OBJECTIVE IMPAIRMENTS: decreased activity tolerance, decreased balance, decreased endurance, difficulty walking, and decreased strength.   ACTIVITY LIMITATIONS: lifting, standing, squatting, stairs, transfers, and reach over head  PARTICIPATION LIMITATIONS: meal prep, cleaning, laundry, community activity, and yard work  PERSONAL FACTORS: N/A are also affecting patient's functional outcome.   REHAB POTENTIAL: Good  CLINICAL DECISION MAKING: Stable/uncomplicated  EVALUATION COMPLEXITY: Low   GOALS: Goals reviewed with patient? No  SHORT TERM GOALS: Target date: 11/20/23 Patient will be independent with performance of HEP to demonstrate adequate self management of symptoms.  Baseline: 12/09/23:  Reports compliance with HEP on regular basis Goal status: MET  2.   Patient will report at least a 25% improvement with function or pain overall since beginning PT. Baseline: 12/09/23:  Feels improvements 10-15% Goal status: In progress  LONG TERM GOALS: Target date: 12/18/23  Patient will improve ABC Scale by at least 10.5 points in order to demonstrate improved self-perceived function while meeting MCID (based on MCID for MS patients) Baseline:  12/09/23: ABC score: 1350/1600= 84.375 (was 1300 / 1600 = 81.3 %) Goal status: In progress  2.  Patient will maintain single leg balance for 30 seconds bilaterally in order to demonstrate safety with single leg  balancing tasks such as stair navigation.  Baseline: 12/09/23: test next session. Goal status: Not tested  3.  Patient will report 0 near falls within a weeks time in order to  demonstrate improved risk of falls.  Baseline: 12/09/23:  No reports of falls in week, reports occasional feet crossing. Goal status: Ongoing  4. Patient will report at least a 50% improvement with function or pain overall since beginning PT. Baseline: 12/09/23:  Feels improvements 10-15% Goal status: Ongoing  PLAN:  PT FREQUENCY: 2x/week  PT DURATION: 6 weeks  PLANNED INTERVENTIONS: 97164- PT Re-evaluation, 97110-Therapeutic exercises, 97530- Therapeutic activity, 97112- Neuromuscular re-education, 97535- Self Care, [id number]- Manual therapy, [id number]- Gait training, [phone]- Electrical stimulation (manual), [id number]- Traction (mechanical), [phone] (1-2 muscles), 20561 (3+ muscles)- Dry Needling, Patient/Family education, Balance training, Stair training, Taping, Joint mobilization, Spinal mobilization, Cryotherapy, and Moist heat.  PLAN FOR NEXT SESSION: Progress LE strength and balance *Use RPE for fatigue rating due to Myastenia Gravis diag.  Complete TUG and SLS time next session.   Augustin Mclean, LPTA/CLT; CBIS [phone]  4:49 PM, 12/09/23

## 2023-12-10 NOTE — Addendum Note (Signed)
 Addended by: LUDGER HEART E on: 12/10/2023 01:50 PM   Modules accepted: Orders

## 2023-12-11 ENCOUNTER — Ambulatory Visit (HOSPITAL_COMMUNITY): Admitting: Physical Therapy

## 2023-12-11 DIAGNOSIS — M6281 Muscle weakness (generalized): Secondary | ICD-10-CM | POA: Diagnosis not present

## 2023-12-11 DIAGNOSIS — Z7409 Other reduced mobility: Secondary | ICD-10-CM | POA: Diagnosis not present

## 2023-12-11 DIAGNOSIS — R29898 Other symptoms and signs involving the musculoskeletal system: Secondary | ICD-10-CM | POA: Diagnosis not present

## 2023-12-11 DIAGNOSIS — R2689 Other abnormalities of gait and mobility: Secondary | ICD-10-CM | POA: Diagnosis not present

## 2023-12-11 DIAGNOSIS — M542 Cervicalgia: Secondary | ICD-10-CM | POA: Diagnosis not present

## 2023-12-11 NOTE — Therapy (Signed)
 OUTPATIENT PHYSICAL THERAPY THORACOLUMBAR TREATMENT    Patient Name: Mary [name] MRN: [id number] DOB:June 30, 1950, 73 y.o., female Today's Date: 12/11/2023  END OF SESSION:  PT End of Session - 12/11/23 1016     Visit Number 7    Number of Visits 12    Date for PT Re-Evaluation 12/30/23    Authorization Type [employer] medicare    Authorization Time Period cohere approved 12 visits from 11/06/2023-01/05/2024    Authorization - Visit Number 7    Authorization - Number of Visits 12    Progress Note Due on Visit 16   PN completed visit #6   PT Start Time 0934    PT Stop Time 1018    PT Time Calculation (min) 44 min    Activity Tolerance Patient tolerated treatment well    Behavior During Therapy WFL for tasks assessed/performed              Past Medical History:  Diagnosis Date   Anemia    Arthritis    Chronic kidney disease    idiopathic micro hematuria    Complication of anesthesia    patient states does not take much medicine to put her to sleep    Edema    ankles to knees    GERD (gastroesophageal reflux disease)    Headache(784.0)    hx of migraines    Hyperlipidemia    Mitral valve prolapse    no symptoms    Myasthenia gravis (HCC)    PONV (postoperative nausea and vomiting)    Scalp alopecia    Seizures (HCC)    1 seizure 45 years ago; unknown etiology and no meds, no seizures since then.   Past Surgical History:  Procedure Laterality Date   BIOPSY  03/24/2020   Procedure: BIOPSY;  Surgeon: Cindie Carlin POUR, DO;  [facility]: [facility];  Service: Endoscopy;;  gastric duodenum   CERVICAL FUSION  2006   cervical 5-6    COLONOSCOPY N/A 03/03/2017   Procedure: COLONOSCOPY;  Surgeon: Harvey Margo CROME, MD;  [facility]: [facility];  Service: Endoscopy;  Laterality: N/A;  1:15 pm   COLONOSCOPY WITH PROPOFOL  N/A 03/24/2020   Procedure: COLONOSCOPY WITH PROPOFOL ;  Surgeon: Cindie Carlin POUR, DO;  [facility];  Service: Endoscopy;  Laterality:  N/A;  10:00am   COLONOSCOPY WITH PROPOFOL  N/A 10/02/2020   Procedure: COLONOSCOPY WITH PROPOFOL ;  Surgeon: Cindie Carlin POUR, DO;  [facility];  Service: Endoscopy;  Laterality: N/A;  ASA II / 9:00   ESOPHAGOGASTRODUODENOSCOPY (EGD) WITH PROPOFOL  N/A 03/24/2020   Procedure: ESOPHAGOGASTRODUODENOSCOPY (EGD) WITH PROPOFOL ;  Surgeon: Cindie Carlin POUR, DO;  [facility];  Service: Endoscopy;  Laterality: N/A;   HEMORRHOID SURGERY N/A 06/23/2017   Procedure: ANOSCOPY WITH REMOVAL OF SINGLE LESION;  Surgeon: Mavis Anes, MD;  Location: [facility];  Service: General;  Laterality: N/A;   MYOCARDIAL PERFUSION STUDY  07/02/2011   NO SCINTIGRAPHIC EVIDENCE OF INDUCIBLE MYOCARDIAL ISCHEMIA. POST-STRESS EF IS 87%.EXERCISE CAPACITY 9 METS. EKG SHOWS NSR AT 69. THERE IS HORIZONTAL ST DEPRESSION WITH EXERCISE IN II, III, AVF SEEN BEST AT EARLY RECOVERY. THIS IS PRESENT IN V3-V5 AS WELL AND RETURNS TO BASELINE AT THE END OF THE TEST. NORMAL STUDY.   POLYPECTOMY  03/24/2020   Procedure: POLYPECTOMY;  Surgeon: Cindie Carlin POUR, DO;  [facility];  Service: Endoscopy;;  colon    POLYPECTOMY  10/02/2020   Procedure: POLYPECTOMY;  Surgeon: Cindie Carlin POUR, DO;  [facility];  Service: Endoscopy;;   RENAL ARTERY DOPPLER  12/28/2003   CELIAC ARTERY: AT REST, 168.4 CM/S; INSPRIATION, 177.6 CM/S. ARCUATE LIGAMENT COMPRESSION SYNDROME. R & L KIDNEYS: RIGHT KIDNEY MEASURES SOME WHAT SMALLER THAN LEFT. KIDNEY ARE ESSENTIALLY SYMMETRICAL IN SHAPE WITH NO OBVIOUS ABNORMALITY VISUALIZED. R & L RENAL ARTERIES: DEMONSTRATE NORMAL SPECTRA . NO SUGGESTION OF DIAMETER REDUCTION, DISSECTION, FIBROMUSCULAR DYSPLASIA OR ANY OTHER VASCULAR ABNOR   TOTAL KNEE ARTHROPLASTY Right 08/31/2013   Procedure: RIGHT TOTAL KNEE ARTHROPLASTY;  Surgeon: Donnice JONETTA Car, MD;  Location: [facility];  Service: Orthopedics;  Laterality: Right;   TOTAL KNEE ARTHROPLASTY Left 03/07/2015   Procedure: TOTAL KNEE  ARTHROPLASTY;  Surgeon: Donnice Car, MD;  Location: [facility];  Service: Orthopedics;  Laterality: Left;   TRANSTHORACIC ECHOCARDIOGRAM  07/02/2011   LV SYSTOLIC FUNCTION NORMAL, EF=>55%, INSIGNIFICANT PERICARDIAL EFFUSION, LEFT ATRIAL SIZE IS NORMAL, RV SYSTOLIC PRESSURE IS NORMAL. NO SIGN VALVULAR HEART DISEASE.   TUBAL LIGATION  1986   veins stripping      bilateral legs    Patient Active Problem List   Diagnosis Date Noted   Chronic rhinitis 11/18/2023   Hypertrophy of nasal turbinates 11/18/2023   Postnasal drip 11/18/2023   Neck pain 03/13/2023   Gait abnormality 01/23/2023   Diplopia 01/23/2023   Gastritis 04/12/2020   Serrated polyp of colon 04/12/2020   Constipation 02/23/2020   Melena 02/23/2020   Esophageal reflux 02/23/2020   Condyloma acuminatum    Encounter for colonoscopy due to history of adenomatous colonic polyps    Overweight (BMI 25.0-29.9) 03/09/2015   S/P left TKA 03/07/2015   Palpitations 05/23/2014   Expected blood loss anemia 09/01/2013   S/P right TKA 08/31/2013   DJD (degenerative joint disease) of knee 08/31/2013   Pre-operative clearance, cardiac 08/17/2013   HTN (hypertension) 05/20/2013   Hyperlipidemia 05/20/2013   Migraine 02/23/2007   ALLERGIC RHINITIS 02/23/2007   TRACHEOBRONCHITIS 02/23/2007   COUGH 02/23/2007    PCP: Bertell Satterfield, MD  REFERRING PROVIDER: Charmel Ivan Hamming, MD  REFERRING DIAG: G62.9 (ICD-10-CM) - Polyneuropathy, unspecified  Rationale for Evaluation and Treatment: Rehabilitation  THERAPY DIAG:  Impairment of balance  Leg weakness, bilateral  Impaired functional mobility, balance, gait, and endurance  ONSET DATE: 4-5 Months   SUBJECTIVE:                                                                                                                                                                                           SUBJECTIVE STATEMENT: Pt reports usual symptoms.  Still attends water  aerobics  weekly.   Feels her endurance/activity tolerance is most lacking as  well as higher level balance.     Patient reports she's noticed she has tinging in feet, toes, that travels up to knees at times. Reports it makes her feel wobbly at times. Reports she has tried massage to her feet and LE and reports it may be helping some but not much. Harder to maintain balance in dark when she gets up in the middle of the night.   PERTINENT HISTORY:  Myastenia Gravis 2 TKR  PAIN:  Are you having pain? Yes: NPRS scale: 2/10 foot, 3/10 headache (constant) Pain location: Bottom of R foot, headache/neck pain Pain description: Achy, N/T Aggravating factors: Walking  Relieving factors: Rest  PRECAUTIONS: None  RED FLAGS: Neuropathy    WEIGHT BEARING RESTRICTIONS: No  FALLS:  Has patient fallen in last 6 months? Yes. Number of falls 1. A lot of near falls. On uneven surfaces.   LIVING ENVIRONMENT: Lives with: lives alone Stairs: Yes: Internal: 12 steps; on right going up and External: 6 steps; can reach both Has following equipment at home: None Has RW and SPC  OCCUPATION: Retired, Pension scheme manager  PLOF: Independent  PATIENT GOALS: To increase stability and see if we can figure out how to get Neuropathy pain from getting worse  NEXT MD VISIT: October 17th, 2025  OBJECTIVE:  Note: Objective measures were completed at Evaluation unless otherwise noted.  DIAGNOSTIC FINDINGS:    PATIENT SURVEYS:  Total ABC score: 1300 / 1600 = 81.3 % 12/09/23: ABC score: 1350/1600= 84.375  COGNITION: Overall cognitive status: Within functional limits for tasks assessed     SENSATION: WFL   POSTURE: rounded shoulders, forward head, and increased thoracic kyphosis  PALPATION:   LUMBAR ROM:   AROM eval  Flexion   Extension   Right lateral flexion   Left lateral flexion   Right rotation   Left rotation    (Blank rows = not tested)  LOWER EXTREMITY ROM:     Active  Right eval  Left eval  Hip flexion    Hip extension    Hip abduction    Hip adduction    Hip internal rotation    Hip external rotation    Knee flexion    Knee extension    Ankle dorsiflexion    Ankle plantarflexion    Ankle inversion    Ankle eversion     (Blank rows = not tested)  LOWER EXTREMITY MMT:    MMT Right eval Left eval Right 12/09/23 Left  12/09/23  Hip flexion 4- 4- 4 4  Hip extension 3+ 3+ 4- 4+  Hip abduction 4- 4- 4- 4  Hip adduction      Hip internal rotation      Hip external rotation      Knee flexion 3+ 3+ 4- 4  Knee extension 4- 4- 4+ 5  Ankle dorsiflexion 5 5 5 5   Ankle plantarflexion      Ankle inversion      Ankle eversion       (Blank rows = not tested)  LUMBAR SPECIAL TESTS:    FUNCTIONAL TESTS:  4 Stage Balance: Side by side: 30 Semi-tandem: 30 ea LE leading Tandem:  30 ea LE leading  SLS: L- 22 R- 11  11/13/23: TUG: 7.58 seconds Functional Gait Assessment Summary 1. GAIT LEVEL SURFACE: Moderate impairment -- gait level surface (1)  (1 points) 2. CHANGE IN GAIT SPEED: Mild impairment -- change in gait speed (2)  (2 points) 3. GAIT WITH HORIZONTAL HEAD TURNS: Mild  impairment -- gait with horizontal head turns (2)  (2 points) 4. GAIT WITH VERTICAL HEAD TURNS: Moderate impairment -- gait with vertical head turns (1)  (1 points) 5. GAIT AND PIVOT TURN: Moderate impairment -- gait and pivot turn (1)  (1 points) 6. STEP OVER OBSTACLE: Normal -- step over obstacle (3)  (3 points) 7. GAIT WITH NARROW BASE OF SUPPORT: Mild impairment -- gait with narrow base of support (2)  (2 points) 8. GAIT WITH EYES CLOSED: Mild impairment -- gait with eyes closed (2)  (2 points) 9. AMBULATING BACKWARDS: Mild impairment -- ambulating backwards (2)  (2 points) 10. STEPS: Normal -- up and down steps (3)  (3 points) Functional Gait Assessment: 19/30=63.3 percent.  12/11/23 TUG: 7.1 seconds SLS:  Rt:  30, Lt 30  GAIT: Distance walked:  30' Assistive device utilized: None Level of assistance: Complete Independence Comments: Mildly unsteady at times  TREATMENT DATE:  12/11/23 TUG: 7.1 seconds SLS:  Rt:  30, Lt 30 Standing:  lunges onto 4 step no UE assist 2X10 each Balance beam down hallway 2RT on Aeromat tandem gait Balance beam down hallway 2RT on Aeromat lateral stepping Obstacle course with 6 and 12 in hurdles (6, 12, 4 step, 6, 12) fwd 2RT, lat 2RT  12/09/23:   476ft Reviewed goals, feels she has improved by 10-15% MMT see above ABC 1350/1600= 84.375 was 81.3 FUNCTIONAL GAIT ASSESSMENT  Date: 12/09/23 Score  GAIT LEVEL SURFACE Instructions: Walk at your normal speed from here to the next mark (6 m) [20 ft]. (3) Normal - Walks 6 m (20 ft) in less than 5.5 seconds, no assistive devices, good speed, no evidence for imbalance, normal gait pattern, deviates no more than 15.24 cm (6 in) outside of the 30.48-cm (12-in) walkway width.  2.   CHANGE IN GAIT SPEED Instructions: Begin walking at your normal pace (for 1.5 m [5 ft]). When I tell you "go," walk as fast as you can (for 1.5 m [5 ft]). When I tell you "slow," walk as slowly as you can (for 1.5 m [5 ft]. (2) Mild impairment - Is able to change speed but demonstrates mild gait deviations, deviates 15.24 -25.4 cm (6 -10 in) outside of the 30.48-cm (12-in) walkway width, or no gait deviations but unable to achieve a significant change in velocity, or uses an assistive device  3.    GAIT WITH HORIZONTAL HEAD TURNS Instructions: Walk from here to the next mark 6 m (20 ft) away. Begin walking at your normal pace. Keep walking straight; after 3 steps, turn your head to the right and keep walking straight while looking to the right. After 3 more steps, turn your head to the left and keep walking straight while looking left. Continue alternating looking right and left. (2) Mild impairment - Performs head turns smoothly with slight change in gait velocity (eg, minor  disruption to smooth gait path), deviates 15.24 -25.4 cm (6 -10 in) outside 30.48-cm (12-in) walkway width, or uses an assistive device.  4.   GAIT WITH VERTICAL HEAD TURNS Instructions: Walk from here to the next mark (6 m [20 ft]). Begin walking at your normal pace. Keep walking straight; after 3 steps, tip your head up and keep walking straight while looking up. After 3 more steps, tip your head down, keep walking straight while looking down. Continue  alternating looking up and down every 3 steps until you have completed 2 repetitions in each direction. (3) Normal - Performs head turns with  no change in gait. Deviates no more than 15.24 cm (6 in) outside 30.48-cm (12-in) walkway width.  5.  GAIT AND PIVOT TURN Instructions: Begin with walking at your normal pace. When I tell you, "turn and stop," turn as quickly as you can to face the opposite direction and stop. (3) Normal - Pivot turns safely within 3 seconds and stops quickly with no loss of balance  6.   STEP OVER OBSTACLE Instructions: Begin walking at your normal speed. When you come to the shoe box, step over it, not around it, and keep walking. (3) Normal - Is able to step over 2 stacked shoe boxes taped together (22.86 cm [9 in] total height) without changing gait speed; no evidence of imbalance.  7.   GAIT WITH NARROW BASE OF SUPPORT Instructions: Walk on the floor with arms folded across the chest, feet aligned heel to toe in tandem for a distance of 3.6 m [12 ft]. The number of steps taken in a straight line are counted for a maximum of 10 steps. (2) Mild impairment - Ambulates 7-9 steps  8.   GAIT WITH EYES CLOSED Instructions: Walk at your normal speed from here to the next mark (6 m [20 ft]) with your eyes closed. (1) Moderate impairment - Walks 6 m (20 ft), slow speed, abnormal gait pattern, evidence for imbalance, deviates 25.4 -38.1 cm (10 -15 in) outside 30.48-cm (12-in) walkway width. Requires more than 9 seconds to ambulate 6 m (20  ft).  9.   AMBULATING BACKWARDS Instructions: Walk backwards until I tell you to stop (2) Mild impairment - Walks 6 m (20 ft), uses assistive device, slower speed, mild gait deviations, deviates 15.24 -25.4 cm (6 -10 in) outside 30.48-cm (12-in) walkway width  10. STEPS Instructions: Walk up these stairs as you would at home (ie, using the rail if necessary). At the top turn around and walk down. (3) Normal-Alternating feet, no rail.  Total 24/30     11/28/23: - Nustep [place] L1 x 5 min, average SPM 62 - Sit to stand with green weighted ball rebounded with trampoline - Balance beam down hallway 2RT on Aeromat tandem gait and lateral stepping - Obstacle course with 6 and 12 in hurdles, SLS with foot on cone, SLS on foam 3# on ankle - Gait holding green weighted ball in cone x 262ft with head turns  11/26/2023  Therapeutic Exercise: -Nustep 5 minutes, level 1 resistance, pt cued for not exceeding 8/10 on fatigue scale -Leg press, 2 sets of 7 reps, plate #4, pt cued for avoidance of knee lock out and eccentric control -Sit to stands, 2 sets of 5 reps, throughout session   Neuromuscular Re-education: -Lateral step up and overs, 1 set of 5 reps, 3lb ankle weights, 8 inch step -Step up and overs, 8 inch step, 1 set of 10 reps -Aeromat walks, tandem and lateral stepping, 2 laps each variation in parallel bars, 3 pound ankle weights -Hurdle and lilly pad balance obstacle course, 6 laps, with 3 pound ankle weights, pt requires CGA but is able to manage 9 inch hurdles   11/20/23 BP:  121/69, HR 61bpm Standing:  hip abduction 10X each  Hip extension 10X each  Side stepping with RTB 20 feet 3RT  Lunge onto BOSU dome up no UE 2X10 each side 7 stair negotiation 4 steps no HR 5RT reciprocally Sit to stands no UE 2X10 Retro ambulation 20 foot 2RT Tandem gait 20 foot 2RT Hurdle step overs 2/12, 2/6 3RT  each Nustep UE/LE 5 minutes level 3 beach  seat 7    11/13/2023  Therapeutic  Exercise: - -Lateral stepping 3 laps 20 feet per lap, second 2 with RTB around ankles, pt cued for upright posture -Sit to stands, 2 sets of 5 reps, throughout session   Neuromuscular Re-education: -TUG -FGA -Speed step ups, 8 inch step, 30 second bout, 13 reps -Walking marches/butt kicks, 2 laps, 40 feet lap, 3 lb ankle weights, gait belt for safety -Lateral stepping with 3 lb ankle weights, 2 laps, 40 foot lap, gait belt for saftey    PATIENT EDUCATION:  Education details: PT evaluation, objective findings, POC, Importance of HEP, Precautions, Clinic policies  Person educated: Patient Education method: Explanation and Demonstration Education comprehension: verbalized understanding and returned demonstration  HOME EXERCISE PROGRAM: Access Code: [email or web address] URL: [email or web address] Date: 11/06/2023 Prepared by: Rosaria Powell-Butler  Exercises - Sit to Stand  - 2 x daily - 7 x weekly - 3 sets - 10 reps - Standing Hip Extension with Counter Support  - 2 x daily - 7 x weekly - 3 sets - 10 reps - Side Stepping with Counter Support  - 2 x daily - 7 x weekly - 3 sets - 10 reps - Standing Tandem Balance with Counter Support  - 2 x daily - 7 x weekly - 3 sets - 30 hold - Standing Single Leg Stance with Counter Support  - 2 x daily - 7 x weekly - 3 sets - 30 hold  ASSESSMENT:  CLINICAL IMPRESSION: Pt with questions regarding HEP reps/sets.  TUG and SLS completed as these were not tested at last visit progress note.  Pt did meet the goal for SLS as she was able to complete 30 second holds bilaterally. Continued with obstacle course challenges with SBA.  Pt able to recover from minor LOB but no major LOB.  Pt required frequent rest breaks due to fatigue.  Encouraged to begin cardiac equipment at [facility] in addition to her water  aerobics to help build up her endurance.  Pt will continue to benefit from skilled PT to address goals unmet.  Fatigue levels stayed 3-4/10 throughout  session.  Patient is a 73 y.o. female who was seen today for physical therapy evaluation and treatment for G62.9 (ICD-10-CM) - Polyneuropathy, unspecified. Patient arrives to PT with relevant history of Myastenia Gravis and new polyneuropathy/peripheral neuropathy diagnosis. Patient reports her biggest issue this date is balance and endurance. On this date, patient demonstrates decreased LE strength, and deficits with static balance.  Patient educated on taking anticipatory breaks when performing her usual daily tasks to aid in decreasing chances of falls due to fatigue. Patient will benefit from skilled physical therapy in order to address strength, balance, and endurance to improve function/QOL.    OBJECTIVE IMPAIRMENTS: decreased activity tolerance, decreased balance, decreased endurance, difficulty walking, and decreased strength.   ACTIVITY LIMITATIONS: lifting, standing, squatting, stairs, transfers, and reach over head  PARTICIPATION LIMITATIONS: meal prep, cleaning, laundry, community activity, and yard work  PERSONAL FACTORS: N/A are also affecting patient's functional outcome.   REHAB POTENTIAL: Good  CLINICAL DECISION MAKING: Stable/uncomplicated  EVALUATION COMPLEXITY: Low   GOALS: Goals reviewed with patient? No  SHORT TERM GOALS: Target date: 11/20/23 Patient will be independent with performance of HEP to demonstrate adequate self management of symptoms.  Baseline: 12/09/23:  Reports compliance with HEP on regular basis Goal status: MET  2.   Patient will report at least a 25% improvement with function or pain overall  since beginning PT. Baseline: 12/09/23:  Feels improvements 10-15% Goal status: In progress  LONG TERM GOALS: Target date: 01/08/24  Patient will improve ABC Scale by at least 10.5 points in order to demonstrate improved self-perceived function while meeting MCID (based on MCID for MS patients) Baseline:  12/09/23: ABC score: 1350/1600= 84.375 (was 1300 /  1600 = 81.3 %) Goal status: In progress  2.  Patient will maintain single leg balance for 30 seconds bilaterally in order to demonstrate safety with single leg balancing tasks such as stair navigation.  Baseline: 12/09/23:test next session,  7/31:  30 seconds each Goal status: MET  3.  Patient will report 0 near falls within a weeks time in order to demonstrate improved risk of falls.  Baseline: 12/09/23:  No reports of falls in week, reports occasional feet crossing. Goal status: Ongoing  4. Patient will report at least a 50% improvement with function or pain overall since beginning PT. Baseline: 12/09/23:  Feels improvements 10-15% Goal status: Ongoing  PLAN:  PT FREQUENCY: 2x/week  PT DURATION: 6 weeks  PLANNED INTERVENTIONS: [id number]- PT Re-evaluation, 97110-Therapeutic exercises, 97530- Therapeutic activity, 97112- Neuromuscular re-education, 97535- Self Care, [id number]- Manual therapy, [id number]- Gait training, [phone]- Electrical stimulation (manual), [id number]- Traction (mechanical), [phone] (1-2 muscles), [id number] (3+ muscles)- Dry Needling, Patient/Family education, Balance training, Stair training, Taping, Joint mobilization, Spinal mobilization, Cryotherapy, and Moist heat.  PLAN FOR NEXT SESSION: Progress LE strength and balance *Use RPE for fatigue rating due to Myastenia Gravis diag.    Greig KATHEE Fuse, PTA/CLT [facility] Ph: [phone]  12:58 PM, 12/11/23

## 2023-12-15 DIAGNOSIS — H532 Diplopia: Secondary | ICD-10-CM | POA: Diagnosis not present

## 2023-12-15 DIAGNOSIS — R5381 Other malaise: Secondary | ICD-10-CM | POA: Diagnosis not present

## 2023-12-15 DIAGNOSIS — G7000 Myasthenia gravis without (acute) exacerbation: Secondary | ICD-10-CM | POA: Diagnosis not present

## 2023-12-15 DIAGNOSIS — G629 Polyneuropathy, unspecified: Secondary | ICD-10-CM | POA: Diagnosis not present

## 2023-12-16 ENCOUNTER — Encounter (HOSPITAL_COMMUNITY): Payer: Self-pay

## 2023-12-16 ENCOUNTER — Ambulatory Visit (HOSPITAL_COMMUNITY): Attending: Psychiatry

## 2023-12-16 DIAGNOSIS — R29898 Other symptoms and signs involving the musculoskeletal system: Secondary | ICD-10-CM | POA: Insufficient documentation

## 2023-12-16 DIAGNOSIS — R2689 Other abnormalities of gait and mobility: Secondary | ICD-10-CM | POA: Insufficient documentation

## 2023-12-16 DIAGNOSIS — Z7409 Other reduced mobility: Secondary | ICD-10-CM | POA: Diagnosis not present

## 2023-12-16 NOTE — Therapy (Signed)
 OUTPATIENT PHYSICAL THERAPY THORACOLUMBAR TREATMENT    Patient Name: Mary [name] MRN: [id number] DOB:1950-11-30, 73 y.o., female Today's Date: 12/16/2023  END OF SESSION:  PT End of Session - 12/16/23 0932     Visit Number 8    Number of Visits 12    Date for PT Re-Evaluation 12/30/23    Authorization Type [employer] medicare    Authorization Time Period cohere approved 12 visits from 11/06/2023-01/05/2024    Authorization - Visit Number 8    Authorization - Number of Visits 12    Progress Note Due on Visit 16    PT Start Time 0932    PT Stop Time 1012    PT Time Calculation (min) 40 min    Activity Tolerance Patient tolerated treatment well    Behavior During Therapy WFL for tasks assessed/performed               Past Medical History:  Diagnosis Date   Anemia    Arthritis    Chronic kidney disease    idiopathic micro hematuria    Complication of anesthesia    patient states does not take much medicine to put her to sleep    Edema    ankles to knees    GERD (gastroesophageal reflux disease)    Headache(784.0)    hx of migraines    Hyperlipidemia    Mitral valve prolapse    no symptoms    Myasthenia gravis (HCC)    PONV (postoperative nausea and vomiting)    Scalp alopecia    Seizures (HCC)    1 seizure 45 years ago; unknown etiology and no meds, no seizures since then.   Past Surgical History:  Procedure Laterality Date   BIOPSY  03/24/2020   Procedure: BIOPSY;  Surgeon: Cindie Carlin POUR, DO;  Location: [facility];  Service: Endoscopy;;  gastric duodenum   CERVICAL FUSION  2006   cervical 5-6    COLONOSCOPY N/A 03/03/2017   Procedure: COLONOSCOPY;  Surgeon: Harvey Margo CROME, MD;  Location: [facility];  Service: Endoscopy;  Laterality: N/A;  1:15 pm   COLONOSCOPY WITH PROPOFOL  N/A 03/24/2020   Procedure: COLONOSCOPY WITH PROPOFOL ;  Surgeon: Cindie Carlin POUR, DO;  Location: [facility];  Service: Endoscopy;  Laterality: N/A;  10:00am    COLONOSCOPY WITH PROPOFOL  N/A 10/02/2020   Procedure: COLONOSCOPY WITH PROPOFOL ;  Surgeon: Cindie Carlin POUR, DO;  [facility]: [facility];  Service: Endoscopy;  Laterality: N/A;  ASA II / 9:00   ESOPHAGOGASTRODUODENOSCOPY (EGD) WITH PROPOFOL  N/A 03/24/2020   Procedure: ESOPHAGOGASTRODUODENOSCOPY (EGD) WITH PROPOFOL ;  Surgeon: Cindie Carlin POUR, DO;  [facility]: [facility];  Service: Endoscopy;  Laterality: N/A;   HEMORRHOID SURGERY N/A 06/23/2017   Procedure: ANOSCOPY WITH REMOVAL OF SINGLE LESION;  Surgeon: Mavis Anes, MD;  Location: [facility];  Service: General;  Laterality: N/A;   MYOCARDIAL PERFUSION STUDY  07/02/2011   NO SCINTIGRAPHIC EVIDENCE OF INDUCIBLE MYOCARDIAL ISCHEMIA. POST-STRESS EF IS 87%.EXERCISE CAPACITY 9 METS. EKG SHOWS NSR AT 69. THERE IS HORIZONTAL ST DEPRESSION WITH EXERCISE IN II, III, AVF SEEN BEST AT EARLY RECOVERY. THIS IS PRESENT IN V3-V5 AS WELL AND RETURNS TO BASELINE AT THE END OF THE TEST. NORMAL STUDY.   POLYPECTOMY  03/24/2020   Procedure: POLYPECTOMY;  Surgeon: Cindie Carlin POUR, DO;  [facility];  Service: Endoscopy;;  colon    POLYPECTOMY  10/02/2020   Procedure: POLYPECTOMY;  Surgeon: Cindie Carlin POUR, DO;  [facility];  Service: Endoscopy;;   RENAL ARTERY DOPPLER  12/28/2003   CELIAC ARTERY: AT REST, 168.4 CM/S; INSPRIATION, 177.6 CM/S. ARCUATE LIGAMENT COMPRESSION SYNDROME. R & L KIDNEYS: RIGHT KIDNEY MEASURES SOME WHAT SMALLER THAN LEFT. KIDNEY ARE ESSENTIALLY SYMMETRICAL IN SHAPE WITH NO OBVIOUS ABNORMALITY VISUALIZED. R & L RENAL ARTERIES: DEMONSTRATE NORMAL SPECTRA . NO SUGGESTION OF DIAMETER REDUCTION, DISSECTION, FIBROMUSCULAR DYSPLASIA OR ANY OTHER VASCULAR ABNOR   TOTAL KNEE ARTHROPLASTY Right 08/31/2013   Procedure: RIGHT TOTAL KNEE ARTHROPLASTY;  Surgeon: Donnice JONETTA Car, MD;  Location: [facility];  Service: Orthopedics;  Laterality: Right;   TOTAL KNEE ARTHROPLASTY Left 03/07/2015   Procedure: TOTAL KNEE ARTHROPLASTY;  Surgeon:  Donnice Car, MD;  Location: [facility];  Service: Orthopedics;  Laterality: Left;   TRANSTHORACIC ECHOCARDIOGRAM  07/02/2011   LV SYSTOLIC FUNCTION NORMAL, EF=>55%, INSIGNIFICANT PERICARDIAL EFFUSION, LEFT ATRIAL SIZE IS NORMAL, RV SYSTOLIC PRESSURE IS NORMAL. NO SIGN VALVULAR HEART DISEASE.   TUBAL LIGATION  1986   veins stripping      bilateral legs    Patient Active Problem List   Diagnosis Date Noted   Chronic rhinitis 11/18/2023   Hypertrophy of nasal turbinates 11/18/2023   Postnasal drip 11/18/2023   Neck pain 03/13/2023   Gait abnormality 01/23/2023   Diplopia 01/23/2023   Gastritis 04/12/2020   Serrated polyp of colon 04/12/2020   Constipation 02/23/2020   Melena 02/23/2020   Esophageal reflux 02/23/2020   Condyloma acuminatum    Encounter for colonoscopy due to history of adenomatous colonic polyps    Overweight (BMI 25.0-29.9) 03/09/2015   S/P left TKA 03/07/2015   Palpitations 05/23/2014   Expected blood loss anemia 09/01/2013   S/P right TKA 08/31/2013   DJD (degenerative joint disease) of knee 08/31/2013   Pre-operative clearance, cardiac 08/17/2013   HTN (hypertension) 05/20/2013   Hyperlipidemia 05/20/2013   Migraine 02/23/2007   ALLERGIC RHINITIS 02/23/2007   TRACHEOBRONCHITIS 02/23/2007   COUGH 02/23/2007    PCP: Bertell Satterfield, MD  REFERRING PROVIDER: Charmel Ivan Hamming, MD  REFERRING DIAG: G62.9 (ICD-10-CM) - Polyneuropathy, unspecified  Rationale for Evaluation and Treatment: Rehabilitation  THERAPY DIAG:  Impairment of balance  Leg weakness, bilateral  Impaired functional mobility, balance, gait, and endurance  ONSET DATE: 4-5 Months   SUBJECTIVE:                                                                                                                                                                                           SUBJECTIVE STATEMENT: Pt states nerves check out and no signs of MG or peripheral neuropathy, still does  not know what symptoms are coming from.  Patient reports she's noticed she has tinging in feet, toes, that travels up to knees at times. Reports it makes her feel wobbly at times. Reports she has tried massage to her feet and LE and reports it may be helping some but not much. Harder to maintain balance in dark when she gets up in the middle of the night.   PERTINENT HISTORY:  Myastenia Gravis 2 TKR  PAIN:  Are you having pain? Yes: NPRS scale: 2/10 foot, 3/10 headache (constant) Pain location: Bottom of R foot, headache/neck pain Pain description: Achy, N/T Aggravating factors: Walking  Relieving factors: Rest  PRECAUTIONS: None  RED FLAGS: Neuropathy    WEIGHT BEARING RESTRICTIONS: No  FALLS:  Has patient fallen in last 6 months? Yes. Number of falls 1. A lot of near falls. On uneven surfaces.   LIVING ENVIRONMENT: Lives with: lives alone Stairs: Yes: Internal: 12 steps; on right going up and External: 6 steps; can reach both Has following equipment at home: None Has RW and SPC  OCCUPATION: Retired, Pension scheme manager  PLOF: Independent  PATIENT GOALS: To increase stability and see if we can figure out how to get Neuropathy pain from getting worse  NEXT MD VISIT: October 17th, 2025  OBJECTIVE:  Note: Objective measures were completed at Evaluation unless otherwise noted.  DIAGNOSTIC FINDINGS:    PATIENT SURVEYS:  Total ABC score: 1300 / 1600 = 81.3 % 12/09/23: ABC score: 1350/1600= 84.375  COGNITION: Overall cognitive status: Within functional limits for tasks assessed     SENSATION: WFL   POSTURE: rounded shoulders, forward head, and increased thoracic kyphosis  PALPATION:   LUMBAR ROM:   AROM eval  Flexion   Extension   Right lateral flexion   Left lateral flexion   Right rotation   Left rotation    (Blank rows = not tested)  LOWER EXTREMITY ROM:     Active  Right eval Left eval  Hip flexion    Hip extension    Hip  abduction    Hip adduction    Hip internal rotation    Hip external rotation    Knee flexion    Knee extension    Ankle dorsiflexion    Ankle plantarflexion    Ankle inversion    Ankle eversion     (Blank rows = not tested)  LOWER EXTREMITY MMT:    MMT Right eval Left eval Right 12/09/23 Left  12/09/23  Hip flexion 4- 4- 4 4  Hip extension 3+ 3+ 4- 4+  Hip abduction 4- 4- 4- 4  Hip adduction      Hip internal rotation      Hip external rotation      Knee flexion 3+ 3+ 4- 4  Knee extension 4- 4- 4+ 5  Ankle dorsiflexion 5 5 5 5   Ankle plantarflexion      Ankle inversion      Ankle eversion       (Blank rows = not tested)  LUMBAR SPECIAL TESTS:    FUNCTIONAL TESTS:  4 Stage Balance: Side by side: 30 Semi-tandem: 30 ea LE leading Tandem:  30 ea LE leading  SLS: L- 22 R- 11  11/13/23: TUG: 7.58 seconds Functional Gait Assessment Summary 1. GAIT LEVEL SURFACE: Moderate impairment -- gait level surface (1)  (1 points) 2. CHANGE IN GAIT SPEED: Mild impairment -- change in gait speed (2)  (2 points) 3. GAIT WITH HORIZONTAL HEAD TURNS: Mild impairment -- gait with horizontal head turns (2)  (  2 points) 4. GAIT WITH VERTICAL HEAD TURNS: Moderate impairment -- gait with vertical head turns (1)  (1 points) 5. GAIT AND PIVOT TURN: Moderate impairment -- gait and pivot turn (1)  (1 points) 6. STEP OVER OBSTACLE: Normal -- step over obstacle (3)  (3 points) 7. GAIT WITH NARROW BASE OF SUPPORT: Mild impairment -- gait with narrow base of support (2)  (2 points) 8. GAIT WITH EYES CLOSED: Mild impairment -- gait with eyes closed (2)  (2 points) 9. AMBULATING BACKWARDS: Mild impairment -- ambulating backwards (2)  (2 points) 10. STEPS: Normal -- up and down steps (3)  (3 points) Functional Gait Assessment: 19/30=63.3 percent.  12/11/23 TUG: 7.1 seconds SLS:  Rt:  30, Lt 30  GAIT: Distance walked: 79' Assistive device utilized: None Level of  assistance: Complete Independence Comments: Mildly unsteady at times  TREATMENT DATE:  12/16/2023  Therapeutic Exercise: -Treadmill 4 minutes, grade 2, speed 1.3 and 1.5 for last 30 seconds -Leg press, 2 sets of 10 reps, plate #4, plate #5 seconds set, pt cued for avoidance of knee lock out and eccentric control -Sit to stands, 2 sets of 5 reps, throughout session   Neuromuscular Re-education: -Heel/toe walks, 4 laps on 20 foot line, pt cued for keeping heels/toes off ground, last two laps with tidal tank column -Lateral stepping with tidal tank, 20 foot bout, pt demonstrates one loss of balance with need for mod therapist assist to keep balance -Standing shoulder rows/extensions, RTB at chest level with narrow base of support, 2 sets of 10 reps, seconds set standing on blue foam -Sliding towel lunge, 2 sets of 4 reps bilaterally, in parallel bars pt cued for decreased UE support   12/11/23 TUG: 7.1 seconds SLS:  Rt:  30, Lt 30 Standing:  lunges onto 4 step no UE assist 2X10 each Balance beam down hallway 2RT on Aeromat tandem gait Balance beam down hallway 2RT on Aeromat lateral stepping Obstacle course with 6 and 12 in hurdles (6, 12, 4 step, 6, 12) fwd 2RT, lat 2RT  12/09/23:   490ft Reviewed goals, feels she has improved by 10-15% MMT see above ABC 1350/1600= 84.375 was 81.3 FUNCTIONAL GAIT ASSESSMENT  Date: 12/09/23 Score  GAIT LEVEL SURFACE Instructions: Walk at your normal speed from here to the next mark (6 m) [20 ft]. (3) Normal - Walks 6 m (20 ft) in less than 5.5 seconds, no assistive devices, good speed, no evidence for imbalance, normal gait pattern, deviates no more than 15.24 cm (6 in) outside of the 30.48-cm (12-in) walkway width.  2.   CHANGE IN GAIT SPEED Instructions: Begin walking at your normal pace (for 1.5 m [5 ft]). When I tell you "go," walk as fast as you can (for 1.5 m [5 ft]). When I tell you "slow," walk as slowly as you can (for 1.5 m [5 ft].  (2) Mild impairment - Is able to change speed but demonstrates mild gait deviations, deviates 15.24 -25.4 cm (6 -10 in) outside of the 30.48-cm (12-in) walkway width, or no gait deviations but unable to achieve a significant change in velocity, or uses an assistive device  3.    GAIT WITH HORIZONTAL HEAD TURNS Instructions: Walk from here to the next mark 6 m (20 ft) away. Begin walking at your normal pace. Keep walking straight; after 3 steps, turn your head to the right and keep walking straight while looking to the right. After 3 more steps, turn your head to the left and  keep walking straight while looking left. Continue alternating looking right and left. (2) Mild impairment - Performs head turns smoothly with slight change in gait velocity (eg, minor disruption to smooth gait path), deviates 15.24 -25.4 cm (6 -10 in) outside 30.48-cm (12-in) walkway width, or uses an assistive device.  4.   GAIT WITH VERTICAL HEAD TURNS Instructions: Walk from here to the next mark (6 m [20 ft]). Begin walking at your normal pace. Keep walking straight; after 3 steps, tip your head up and keep walking straight while looking up. After 3 more steps, tip your head down, keep walking straight while looking down. Continue  alternating looking up and down every 3 steps until you have completed 2 repetitions in each direction. (3) Normal - Performs head turns with no change in gait. Deviates no more than 15.24 cm (6 in) outside 30.48-cm (12-in) walkway width.  5.  GAIT AND PIVOT TURN Instructions: Begin with walking at your normal pace. When I tell you, "turn and stop," turn as quickly as you can to face the opposite direction and stop. (3) Normal - Pivot turns safely within 3 seconds and stops quickly with no loss of balance  6.   STEP OVER OBSTACLE Instructions: Begin walking at your normal speed. When you come to the shoe box, step over it, not around it, and keep walking. (3) Normal - Is able to step over 2 stacked shoe  boxes taped together (22.86 cm [9 in] total height) without changing gait speed; no evidence of imbalance.  7.   GAIT WITH NARROW BASE OF SUPPORT Instructions: Walk on the floor with arms folded across the chest, feet aligned heel to toe in tandem for a distance of 3.6 m [12 ft]. The number of steps taken in a straight line are counted for a maximum of 10 steps. (2) Mild impairment - Ambulates 7-9 steps  8.   GAIT WITH EYES CLOSED Instructions: Walk at your normal speed from here to the next mark (6 m [20 ft]) with your eyes closed. (1) Moderate impairment - Walks 6 m (20 ft), slow speed, abnormal gait pattern, evidence for imbalance, deviates 25.4 -38.1 cm (10 -15 in) outside 30.48-cm (12-in) walkway width. Requires more than 9 seconds to ambulate 6 m (20 ft).  9.   AMBULATING BACKWARDS Instructions: Walk backwards until I tell you to stop (2) Mild impairment - Walks 6 m (20 ft), uses assistive device, slower speed, mild gait deviations, deviates 15.24 -25.4 cm (6 -10 in) outside 30.48-cm (12-in) walkway width  10. STEPS Instructions: Walk up these stairs as you would at home (ie, using the rail if necessary). At the top turn around and walk down. (3) Normal-Alternating feet, no rail.  Total 24/30     PATIENT EDUCATION:  Education details: PT evaluation, objective findings, POC, Importance of HEP, Precautions, Clinic policies  Person educated: Patient Education method: Explanation and Demonstration Education comprehension: verbalized understanding and returned demonstration  HOME EXERCISE PROGRAM: Access Code: [email or web address] URL: [email or web address] Date: 11/06/2023 Prepared by: Rosaria Powell-Butler  Exercises - Sit to Stand  - 2 x daily - 7 x weekly - 3 sets - 10 reps - Standing Hip Extension with Counter Support  - 2 x daily - 7 x weekly - 3 sets - 10 reps - Side Stepping with Counter Support  - 2 x daily - 7 x weekly - 3 sets - 10 reps - Standing Tandem Balance with Counter  Support  - 2 x daily - 7 x  weekly - 3 sets - 30 hold - Standing Single Leg Stance with Counter Support  - 2 x daily - 7 x weekly - 3 sets - 30 hold  ASSESSMENT:  CLINICAL IMPRESSION: Patient continues to demonstrate decreased LE strength, decreased endurance and balance. Patient also demonstrates improved endurance with aerobic based exercise during today's session on treadmill. Patient able to progress dynamic balance and core activation exercises today with lunge and resisted walking variations, good performance with verbal cueing. Patient would continue to benefit from skilled physical therapy for increased endurance with ambulation, increased LE strength, and improved balance for improved quality of life, improved independence with gait training and continued progress towards therapy goals.   Patient is a 73 y.o. female who was seen today for physical therapy evaluation and treatment for G62.9 (ICD-10-CM) - Polyneuropathy, unspecified. Patient arrives to PT with relevant history of Myastenia Gravis and new polyneuropathy/peripheral neuropathy diagnosis. Patient reports her biggest issue this date is balance and endurance. On this date, patient demonstrates decreased LE strength, and deficits with static balance.  Patient educated on taking anticipatory breaks when performing her usual daily tasks to aid in decreasing chances of falls due to fatigue. Patient will benefit from skilled physical therapy in order to address strength, balance, and endurance to improve function/QOL.    OBJECTIVE IMPAIRMENTS: decreased activity tolerance, decreased balance, decreased endurance, difficulty walking, and decreased strength.   ACTIVITY LIMITATIONS: lifting, standing, squatting, stairs, transfers, and reach over head  PARTICIPATION LIMITATIONS: meal prep, cleaning, laundry, community activity, and yard work  PERSONAL FACTORS: N/A are also affecting patient's functional outcome.   REHAB POTENTIAL:  Good  CLINICAL DECISION MAKING: Stable/uncomplicated  EVALUATION COMPLEXITY: Low   GOALS: Goals reviewed with patient? No  SHORT TERM GOALS: Target date: 11/20/23 Patient will be independent with performance of HEP to demonstrate adequate self management of symptoms.  Baseline: 12/09/23:  Reports compliance with HEP on regular basis Goal status: MET  2.   Patient will report at least a 25% improvement with function or pain overall since beginning PT. Baseline: 12/09/23:  Feels improvements 10-15% Goal status: In progress  LONG TERM GOALS: Target date: 01/08/24  Patient will improve ABC Scale by at least 10.5 points in order to demonstrate improved self-perceived function while meeting MCID (based on MCID for MS patients) Baseline:  12/09/23: ABC score: 1350/1600= 84.375 (was 1300 / 1600 = 81.3 %) Goal status: In progress  2.  Patient will maintain single leg balance for 30 seconds bilaterally in order to demonstrate safety with single leg balancing tasks such as stair navigation.  Baseline: 12/09/23:test next session,  7/31:  30 seconds each Goal status: MET  3.  Patient will report 0 near falls within a weeks time in order to demonstrate improved risk of falls.  Baseline: 12/09/23:  No reports of falls in week, reports occasional feet crossing. Goal status: Ongoing  4. Patient will report at least a 50% improvement with function or pain overall since beginning PT. Baseline: 12/09/23:  Feels improvements 10-15% Goal status: Ongoing  PLAN:  PT FREQUENCY: 2x/week  PT DURATION: 6 weeks  PLANNED INTERVENTIONS: [id number]- PT Re-evaluation, 97110-Therapeutic exercises, 97530- Therapeutic activity, 97112- Neuromuscular re-education, 97535- Self Care, [id number]- Manual therapy, [id number]- Gait training, [phone]- Electrical stimulation (manual), [id number]- Traction (mechanical), [phone] (1-2 muscles), [id number] (3+ muscles)- Dry Needling, Patient/Family education, Balance training, Stair training, Taping, Joint  mobilization, Spinal mobilization, Cryotherapy, and Moist heat.  PLAN FOR NEXT SESSION: Progress LE strength and balance *Use RPE for fatigue  rating due to Myastenia Gravis diag.    Lang Ada, PT, DPT [facility] Office: [phone] 10:16 AM, 12/16/23

## 2023-12-18 ENCOUNTER — Ambulatory Visit (HOSPITAL_COMMUNITY): Admitting: Physical Therapy

## 2023-12-18 DIAGNOSIS — R2689 Other abnormalities of gait and mobility: Secondary | ICD-10-CM

## 2023-12-18 DIAGNOSIS — R29898 Other symptoms and signs involving the musculoskeletal system: Secondary | ICD-10-CM | POA: Diagnosis not present

## 2023-12-18 DIAGNOSIS — Z7409 Other reduced mobility: Secondary | ICD-10-CM | POA: Diagnosis not present

## 2023-12-18 NOTE — Therapy (Signed)
 OUTPATIENT PHYSICAL THERAPY THORACOLUMBAR TREATMENT    Patient Name: Mary [name] MRN: [id number] DOB:[birth year], 73 y.o., female Today's Date: 12/18/2023  END OF SESSION:  PT End of Session - 12/18/23 1222     Visit Number 9    Number of Visits 12    Date for PT Re-Evaluation 12/30/23    Authorization Type [employer] medicare    Authorization Time Period cohere approved 12 visits from 11/06/2023-01/05/2024    Authorization - Visit Number 9    Authorization - Number of Visits 12    Progress Note Due on Visit 16    PT Start Time 0935    PT Stop Time 1015    PT Time Calculation (min) 40 min    Activity Tolerance Patient tolerated treatment well    Behavior During Therapy WFL for tasks assessed/performed                Past Medical History:  Diagnosis Date   Anemia    Arthritis    Chronic kidney disease    idiopathic micro hematuria    Complication of anesthesia    patient states does not take much medicine to put her to sleep    Edema    ankles to knees    GERD (gastroesophageal reflux disease)    Headache(784.0)    hx of migraines    Hyperlipidemia    Mitral valve prolapse    no symptoms    Myasthenia gravis (HCC)    PONV (postoperative nausea and vomiting)    Scalp alopecia    Seizures (HCC)    1 seizure 45 years ago; unknown etiology and no meds, no seizures since then.   Past Surgical History:  Procedure Laterality Date   BIOPSY  03/24/2020   Procedure: BIOPSY;  Surgeon: Cindie Carlin POUR, DO;  Location: [facility];  Service: Endoscopy;;  gastric duodenum   CERVICAL FUSION  2006   cervical 5-6    COLONOSCOPY N/A 03/03/2017   Procedure: COLONOSCOPY;  Surgeon: Harvey Margo CROME, MD;  Location: [facility];  Service: Endoscopy;  Laterality: N/A;  1:15 pm   COLONOSCOPY WITH PROPOFOL  N/A 03/24/2020   Procedure: COLONOSCOPY WITH PROPOFOL ;  Surgeon: Cindie Carlin POUR, DO;  [facility]: [facility];  Service: Endoscopy;  Laterality: N/A;  10:00am    COLONOSCOPY WITH PROPOFOL  N/A 10/02/2020   Procedure: COLONOSCOPY WITH PROPOFOL ;  Surgeon: Cindie Carlin POUR, DO;  [facility]: [facility];  Service: Endoscopy;  Laterality: N/A;  ASA II / 9:00   ESOPHAGOGASTRODUODENOSCOPY (EGD) WITH PROPOFOL  N/A 03/24/2020   Procedure: ESOPHAGOGASTRODUODENOSCOPY (EGD) WITH PROPOFOL ;  Surgeon: Cindie Carlin POUR, DO;  [facility]: [facility];  Service: Endoscopy;  Laterality: N/A;   HEMORRHOID SURGERY N/A 06/23/2017   Procedure: ANOSCOPY WITH REMOVAL OF SINGLE LESION;  Surgeon: Mavis Anes, MD;  Location: [facility];  Service: General;  Laterality: N/A;   MYOCARDIAL PERFUSION STUDY  07/02/2011   NO SCINTIGRAPHIC EVIDENCE OF INDUCIBLE MYOCARDIAL ISCHEMIA. POST-STRESS EF IS 87%.EXERCISE CAPACITY 9 METS. EKG SHOWS NSR AT 69. THERE IS HORIZONTAL ST DEPRESSION WITH EXERCISE IN II, III, AVF SEEN BEST AT EARLY RECOVERY. THIS IS PRESENT IN V3-V5 AS WELL AND RETURNS TO BASELINE AT THE END OF THE TEST. NORMAL STUDY.   POLYPECTOMY  03/24/2020   Procedure: POLYPECTOMY;  Surgeon: Cindie Carlin POUR, DO;  [facility];  Service: Endoscopy;;  colon    POLYPECTOMY  10/02/2020   Procedure: POLYPECTOMY;  Surgeon: Cindie Carlin POUR, DO;  [facility];  Service: Endoscopy;;   RENAL ARTERY DOPPLER  12/28/2003   CELIAC ARTERY: AT REST, 168.4 CM/S; INSPRIATION, 177.6 CM/S. ARCUATE LIGAMENT COMPRESSION SYNDROME. R & L KIDNEYS: RIGHT KIDNEY MEASURES SOME WHAT SMALLER THAN LEFT. KIDNEY ARE ESSENTIALLY SYMMETRICAL IN SHAPE WITH NO OBVIOUS ABNORMALITY VISUALIZED. R & L RENAL ARTERIES: DEMONSTRATE NORMAL SPECTRA . NO SUGGESTION OF DIAMETER REDUCTION, DISSECTION, FIBROMUSCULAR DYSPLASIA OR ANY OTHER VASCULAR ABNOR   TOTAL KNEE ARTHROPLASTY Right 08/31/2013   Procedure: RIGHT TOTAL KNEE ARTHROPLASTY;  Surgeon: Donnice JONETTA Car, MD;  Location: [facility];  Service: Orthopedics;  Laterality: Right;   TOTAL KNEE ARTHROPLASTY Left 03/07/2015   Procedure: TOTAL KNEE ARTHROPLASTY;  Surgeon:  Donnice Car, MD;  Location: [facility];  Service: Orthopedics;  Laterality: Left;   TRANSTHORACIC ECHOCARDIOGRAM  07/02/2011   LV SYSTOLIC FUNCTION NORMAL, EF=>55%, INSIGNIFICANT PERICARDIAL EFFUSION, LEFT ATRIAL SIZE IS NORMAL, RV SYSTOLIC PRESSURE IS NORMAL. NO SIGN VALVULAR HEART DISEASE.   TUBAL LIGATION  1986   veins stripping      bilateral legs    Patient Active Problem List   Diagnosis Date Noted   Chronic rhinitis 11/18/2023   Hypertrophy of nasal turbinates 11/18/2023   Postnasal drip 11/18/2023   Neck pain 03/13/2023   Gait abnormality 01/23/2023   Diplopia 01/23/2023   Gastritis 04/12/2020   Serrated polyp of colon 04/12/2020   Constipation 02/23/2020   Melena 02/23/2020   Esophageal reflux 02/23/2020   Condyloma acuminatum    Encounter for colonoscopy due to history of adenomatous colonic polyps    Overweight (BMI 25.0-29.9) 03/09/2015   S/P left TKA 03/07/2015   Palpitations 05/23/2014   Expected blood loss anemia 09/01/2013   S/P right TKA 08/31/2013   DJD (degenerative joint disease) of knee 08/31/2013   Pre-operative clearance, cardiac 08/17/2013   HTN (hypertension) 05/20/2013   Hyperlipidemia 05/20/2013   Migraine 02/23/2007   ALLERGIC RHINITIS 02/23/2007   TRACHEOBRONCHITIS 02/23/2007   COUGH 02/23/2007    PCP: Bertell Satterfield, MD  REFERRING PROVIDER: Charmel Ivan Hamming, MD  REFERRING DIAG: G62.9 (ICD-10-CM) - Polyneuropathy, unspecified  Rationale for Evaluation and Treatment: Rehabilitation  THERAPY DIAG:  Impairment of balance  Leg weakness, bilateral  Impaired functional mobility, balance, gait, and endurance  ONSET DATE: 4-5 Months   SUBJECTIVE:                                                                                                                                                                                           SUBJECTIVE STATEMENT: Tries to walk 6/10ths of a mile everyday but was walking 2 miles until she started  having all these issues.  Still  frustrated why she's having symptoms when nerve check showed nothing.     EMG completed 12/15/23 revealed no signs of MG or peripheral neuropathy, still does not know what symptoms are coming from  Patient reports she's noticed she has tinging in feet, toes, that travels up to knees at times. Reports it makes her feel wobbly at times. Reports she has tried massage to her feet and LE and reports it may be helping some but not much. Harder to maintain balance in dark when she gets up in the middle of the night.   PERTINENT HISTORY:  Myastenia Gravis 2 TKR  PAIN:  Are you having pain? Yes: NPRS scale: 2/10 foot, 3/10 headache (constant) Pain location: Bottom of R foot, headache/neck pain Pain description: Achy, N/T Aggravating factors: Walking  Relieving factors: Rest  PRECAUTIONS: None  RED FLAGS: Neuropathy    WEIGHT BEARING RESTRICTIONS: No  FALLS:  Has patient fallen in last 6 months? Yes. Number of falls 1. A lot of near falls. On uneven surfaces.   LIVING ENVIRONMENT: Lives with: lives alone Stairs: Yes: Internal: 12 steps; on right going up and External: 6 steps; can reach both Has following equipment at home: None Has RW and SPC  OCCUPATION: Retired, Pension scheme manager  PLOF: Independent  PATIENT GOALS: To increase stability and see if we can figure out how to get Neuropathy pain from getting worse  NEXT MD VISIT: October 17th, 2025  OBJECTIVE:  Note: Objective measures were completed at Evaluation unless otherwise noted.  DIAGNOSTIC FINDINGS:    PATIENT SURVEYS:  Total ABC score: 1300 / 1600 = 81.3 % 12/09/23: ABC score: 1350/1600= 84.375  COGNITION: Overall cognitive status: Within functional limits for tasks assessed     SENSATION: WFL   POSTURE: rounded shoulders, forward head, and increased thoracic kyphosis  PALPATION:   LUMBAR ROM:   AROM eval  Flexion   Extension   Right lateral flexion   Left  lateral flexion   Right rotation   Left rotation    (Blank rows = not tested)  LOWER EXTREMITY ROM:     Active  Right eval Left eval  Hip flexion    Hip extension    Hip abduction    Hip adduction    Hip internal rotation    Hip external rotation    Knee flexion    Knee extension    Ankle dorsiflexion    Ankle plantarflexion    Ankle inversion    Ankle eversion     (Blank rows = not tested)  LOWER EXTREMITY MMT:    MMT Right eval Left eval Right 12/09/23 Left  12/09/23  Hip flexion 4- 4- 4 4  Hip extension 3+ 3+ 4- 4+  Hip abduction 4- 4- 4- 4  Hip adduction      Hip internal rotation      Hip external rotation      Knee flexion 3+ 3+ 4- 4  Knee extension 4- 4- 4+ 5  Ankle dorsiflexion 5 5 5 5   Ankle plantarflexion      Ankle inversion      Ankle eversion       (Blank rows = not tested)  LUMBAR SPECIAL TESTS:    FUNCTIONAL TESTS:  4 Stage Balance: Side by side: 30 Semi-tandem: 30 ea LE leading Tandem:  30 ea LE leading  SLS: L- 22 R- 11  11/13/23: TUG: 7.58 seconds Functional Gait Assessment Summary 1. GAIT LEVEL SURFACE: Moderate impairment -- gait level surface (1)  (  1 points) 2. CHANGE IN GAIT SPEED: Mild impairment -- change in gait speed (2)  (2 points) 3. GAIT WITH HORIZONTAL HEAD TURNS: Mild impairment -- gait with horizontal head turns (2)  (2 points) 4. GAIT WITH VERTICAL HEAD TURNS: Moderate impairment -- gait with vertical head turns (1)  (1 points) 5. GAIT AND PIVOT TURN: Moderate impairment -- gait and pivot turn (1)  (1 points) 6. STEP OVER OBSTACLE: Normal -- step over obstacle (3)  (3 points) 7. GAIT WITH NARROW BASE OF SUPPORT: Mild impairment -- gait with narrow base of support (2)  (2 points) 8. GAIT WITH EYES CLOSED: Mild impairment -- gait with eyes closed (2)  (2 points) 9. AMBULATING BACKWARDS: Mild impairment -- ambulating backwards (2)  (2 points) 10. STEPS: Normal -- up and down steps (3)  (3  points) Functional Gait Assessment: 19/30=63.3 percent.  12/11/23 amd 12/09/23 TUG: 7.1 seconds SLS:  Rt:  30, Lt 30 FGA: 24/30 451ft Reviewed goals, feels she has improved by 10-15% MMT see above ABC 1350/1600= 84.375 was 81.3  GAIT: Distance walked: 3' Assistive device utilized: None Level of assistance: Complete Independence Comments: Mildly unsteady at times  TREATMENT DATE:  12/18/2023  Treadmill 4 minutes, grade 2.  Speed 1.3-1.20mph  last 2 minutes Heel/toe walks, 4 laps on 20 foot line, pt cued for keeping heels/toes off ground Lateral stepping with tidal tank with squat, 20 foot line; 1 RT rest between RTB rows 2X10 RTB shoulder extension 2X10 Leg press 2X10, 4 plates   05/16/7972  Therapeutic Exercise: -Treadmill 4 minutes, grade 2, speed 1.3 and 1.5 for last 30 seconds -Leg press, 2 sets of 10 reps, plate #4, plate #5 seconds set, pt cued for avoidance of knee lock out and eccentric control -Sit to stands, 2 sets of 5 reps, throughout session   Neuromuscular Re-education: -Heel/toe walks, 4 laps on 20 foot line, pt cued for keeping heels/toes off ground, last two laps with tidal tank column -Lateral stepping with tidal tank, 20 foot bout, pt demonstrates one loss of balance with need for mod therapist assist to keep balance -Standing shoulder rows/extensions, RTB at chest level with narrow base of support, 2 sets of 10 reps, seconds set standing on blue foam -Sliding towel lunge, 2 sets of 4 reps bilaterally, in parallel bars pt cued for decreased UE support   12/11/23 TUG: 7.1 seconds SLS:  Rt:  30, Lt 30 Standing:  lunges onto 4 step no UE assist 2X10 each Balance beam down hallway 2RT on Aeromat tandem gait Balance beam down hallway 2RT on Aeromat lateral stepping Obstacle course with 6 and 12 in hurdles (6, 12, 4 step, 6, 12) fwd 2RT, lat 2RT    PATIENT EDUCATION:  Education details: PT evaluation, objective findings, POC, Importance of  HEP, Precautions, Clinic policies  Person educated: Patient Education method: Explanation and Demonstration Education comprehension: verbalized understanding and returned demonstration  HOME EXERCISE PROGRAM: Access Code: [email or web address] URL: [email or web address] Date: 11/06/2023 Prepared by: Rosaria Powell-Butler  Exercises - Sit to Stand  - 2 x daily - 7 x weekly - 3 sets - 10 reps - Standing Hip Extension with Counter Support  - 2 x daily - 7 x weekly - 3 sets - 10 reps - Side Stepping with Counter Support  - 2 x daily - 7 x weekly - 3 sets - 10 reps - Standing Tandem Balance with Counter Support  - 2 x daily - 7 x weekly - 3 sets - 30  hold - Standing Single Leg Stance with Counter Support  - 2 x daily - 7 x weekly - 3 sets - 30 hold  Access Code: [email or web address] URL: [email or web address] Date: 12/18/2023 - Scapular Retraction with Resistance  - 2 x daily - 7 x weekly - 2 sets - 10 reps - Standing Shoulder Extension with Resistance  - 2 x daily - 7 x weekly - 2 sets - 10 reps  ASSESSMENT:  CLINICAL IMPRESSION: Focus remains on improving LE strength and stability. Pt with difficulty maintaining smooth gait during treadmill with flat foot and short, choppy steps, uneven stride length.  Pt was able to improve this with verbal cues, however when began talking or outside distraction returned to dysfunctional gait.  Patient was able to complete side stepping squat in good form but required rest break following 20 feet due to fatigue. No LOB with heel/toe walking along line.  Updated HEP to include postural strengthening today and given green band with loop. Patient would continue to benefit from skilled physical therapy for increased endurance with ambulation, increased LE strength, and improved balance for improved quality of life, improved independence with gait training and continued progress towards therapy goals.   Patient is a 73 y.o. female who was seen today for  physical therapy evaluation and treatment for G62.9 (ICD-10-CM) - Polyneuropathy, unspecified. Patient arrives to PT with relevant history of Myastenia Gravis and new polyneuropathy/peripheral neuropathy diagnosis. Patient reports her biggest issue this date is balance and endurance. On this date, patient demonstrates decreased LE strength, and deficits with static balance.  Patient educated on taking anticipatory breaks when performing her usual daily tasks to aid in decreasing chances of falls due to fatigue. Patient will benefit from skilled physical therapy in order to address strength, balance, and endurance to improve function/QOL.    OBJECTIVE IMPAIRMENTS: decreased activity tolerance, decreased balance, decreased endurance, difficulty walking, and decreased strength.   ACTIVITY LIMITATIONS: lifting, standing, squatting, stairs, transfers, and reach over head  PARTICIPATION LIMITATIONS: meal prep, cleaning, laundry, community activity, and yard work  PERSONAL FACTORS: N/A are also affecting patient's functional outcome.   REHAB POTENTIAL: Good  CLINICAL DECISION MAKING: Stable/uncomplicated  EVALUATION COMPLEXITY: Low   GOALS: Goals reviewed with patient? No  SHORT TERM GOALS: Target date: 11/20/23 Patient will be independent with performance of HEP to demonstrate adequate self management of symptoms.  Baseline: 12/09/23:  Reports compliance with HEP on regular basis Goal status: MET  2.   Patient will report at least a 25% improvement with function or pain overall since beginning PT. Baseline: 12/09/23:  Feels improvements 10-15% Goal status: In progress  LONG TERM GOALS: Target date: 01/08/24  Patient will improve ABC Scale by at least 10.5 points in order to demonstrate improved self-perceived function while meeting MCID (based on MCID for MS patients) Baseline:  12/09/23: ABC score: 1350/1600= 84.375 (was 1300 / 1600 = 81.3 %) Goal status: In progress  2.  Patient will  maintain single leg balance for 30 seconds bilaterally in order to demonstrate safety with single leg balancing tasks such as stair navigation.  Baseline: 12/09/23:test next session,  7/31:  30 seconds each Goal status: MET  3.  Patient will report 0 near falls within a weeks time in order to demonstrate improved risk of falls.  Baseline: 12/09/23:  No reports of falls in week, reports occasional feet crossing. Goal status: Ongoing  4. Patient will report at least a 50% improvement with function or pain overall since beginning PT. Baseline:  12/09/23:  Feels improvements 10-15% Goal status: Ongoing  PLAN:  PT FREQUENCY: 2x/week  PT DURATION: 6 weeks  PLANNED INTERVENTIONS: 97164- PT Re-evaluation, 97110-Therapeutic exercises, 97530- Therapeutic activity, 97112- Neuromuscular re-education, 97535- Self Care, [id number]- Manual therapy, [id number]- Gait training, [phone]- Electrical stimulation (manual), [id number]- Traction (mechanical), 20560 (1-2 muscles), 20561 (3+ muscles)- Dry Needling, Patient/Family education, Balance training, Stair training, Taping, Joint mobilization, Spinal mobilization, Cryotherapy, and Moist heat.  PLAN FOR NEXT SESSION: Progress LE strength and balance.    Greig KATHEE Fuse, PTA/CLT [facility] Ph: [phone] 12:23 PM, 12/18/23

## 2023-12-22 ENCOUNTER — Ambulatory Visit (HOSPITAL_COMMUNITY)

## 2023-12-22 ENCOUNTER — Encounter (HOSPITAL_COMMUNITY): Payer: Self-pay

## 2023-12-22 DIAGNOSIS — R2689 Other abnormalities of gait and mobility: Secondary | ICD-10-CM | POA: Diagnosis not present

## 2023-12-22 DIAGNOSIS — R29898 Other symptoms and signs involving the musculoskeletal system: Secondary | ICD-10-CM

## 2023-12-22 DIAGNOSIS — Z7409 Other reduced mobility: Secondary | ICD-10-CM | POA: Diagnosis not present

## 2023-12-22 NOTE — Therapy (Signed)
 OUTPATIENT PHYSICAL THERAPY THORACOLUMBAR TREATMENT    Patient Name: Mary [name] MRN: [id number] DOB:06/03/1950, 73 y.o., female Today's Date: 12/22/2023  END OF SESSION:  PT End of Session - 12/22/23 1016     Visit Number 10    Number of Visits 12    Date for PT Re-Evaluation 12/30/23    Authorization Type [employer] medicare    Authorization Time Period cohere approved 12 visits from 11/06/2023-01/05/2024    Authorization - Visit Number 10    Authorization - Number of Visits 12    PT Start Time 1018    PT Stop Time 1100    PT Time Calculation (min) 42 min    Activity Tolerance Patient tolerated treatment well    Behavior During Therapy WFL for tasks assessed/performed            Past Medical History:  Diagnosis Date   Anemia    Arthritis    Chronic kidney disease    idiopathic micro hematuria    Complication of anesthesia    patient states does not take much medicine to put her to sleep    Edema    ankles to knees    GERD (gastroesophageal reflux disease)    Headache(784.0)    hx of migraines    Hyperlipidemia    Mitral valve prolapse    no symptoms    Myasthenia gravis (HCC)    PONV (postoperative nausea and vomiting)    Scalp alopecia    Seizures (HCC)    1 seizure 45 years ago; unknown etiology and no meds, no seizures since then.   Past Surgical History:  Procedure Laterality Date   BIOPSY  03/24/2020   Procedure: BIOPSY;  Surgeon: Cindie Carlin POUR, DO;  Location: [facility];  Service: Endoscopy;;  gastric duodenum   CERVICAL FUSION  2006   cervical 5-6    COLONOSCOPY N/A 03/03/2017   Procedure: COLONOSCOPY;  Surgeon: Harvey Margo CROME, MD;  Location: [facility];  Service: Endoscopy;  Laterality: N/A;  1:15 pm   COLONOSCOPY WITH PROPOFOL  N/A 03/24/2020   Procedure: COLONOSCOPY WITH PROPOFOL ;  Surgeon: Cindie Carlin POUR, DO;  [facility]: [facility];  Service: Endoscopy;  Laterality: N/A;  10:00am   COLONOSCOPY WITH PROPOFOL  N/A 10/02/2020    Procedure: COLONOSCOPY WITH PROPOFOL ;  Surgeon: Cindie Carlin POUR, DO;  [facility]: [facility];  Service: Endoscopy;  Laterality: N/A;  ASA II / 9:00   ESOPHAGOGASTRODUODENOSCOPY (EGD) WITH PROPOFOL  N/A 03/24/2020   Procedure: ESOPHAGOGASTRODUODENOSCOPY (EGD) WITH PROPOFOL ;  Surgeon: Cindie Carlin POUR, DO;  [facility]: [facility];  Service: Endoscopy;  Laterality: N/A;   HEMORRHOID SURGERY N/A 06/23/2017   Procedure: ANOSCOPY WITH REMOVAL OF SINGLE LESION;  Surgeon: Mavis Anes, MD;  Location: [facility];  Service: General;  Laterality: N/A;   MYOCARDIAL PERFUSION STUDY  07/02/2011   NO SCINTIGRAPHIC EVIDENCE OF INDUCIBLE MYOCARDIAL ISCHEMIA. POST-STRESS EF IS 87%.EXERCISE CAPACITY 9 METS. EKG SHOWS NSR AT 69. THERE IS HORIZONTAL ST DEPRESSION WITH EXERCISE IN II, III, AVF SEEN BEST AT EARLY RECOVERY. THIS IS PRESENT IN V3-V5 AS WELL AND RETURNS TO BASELINE AT THE END OF THE TEST. NORMAL STUDY.   POLYPECTOMY  03/24/2020   Procedure: POLYPECTOMY;  Surgeon: Cindie Carlin POUR, DO;  [facility];  Service: Endoscopy;;  colon    POLYPECTOMY  10/02/2020   Procedure: POLYPECTOMY;  Surgeon: Cindie Carlin POUR, DO;  [facility]: [facility];  Service: Endoscopy;;   RENAL ARTERY DOPPLER  12/28/2003  CELIAC ARTERY: AT REST, 168.4 CM/S; INSPRIATION, 177.6 CM/S. ARCUATE LIGAMENT COMPRESSION SYNDROME. R & L KIDNEYS: RIGHT KIDNEY MEASURES SOME WHAT SMALLER THAN LEFT. KIDNEY ARE ESSENTIALLY SYMMETRICAL IN SHAPE WITH NO OBVIOUS ABNORMALITY VISUALIZED. R & L RENAL ARTERIES: DEMONSTRATE NORMAL SPECTRA . NO SUGGESTION OF DIAMETER REDUCTION, DISSECTION, FIBROMUSCULAR DYSPLASIA OR ANY OTHER VASCULAR ABNOR   TOTAL KNEE ARTHROPLASTY Right 08/31/2013   Procedure: RIGHT TOTAL KNEE ARTHROPLASTY;  Surgeon: Donnice JONETTA Car, MD;  Location: [facility];  Service: Orthopedics;  Laterality: Right;   TOTAL KNEE ARTHROPLASTY Left 03/07/2015   Procedure: TOTAL KNEE ARTHROPLASTY;  Surgeon: Donnice Car, MD;  Location: [facility];   Service: Orthopedics;  Laterality: Left;   TRANSTHORACIC ECHOCARDIOGRAM  07/02/2011   LV SYSTOLIC FUNCTION NORMAL, EF=>55%, INSIGNIFICANT PERICARDIAL EFFUSION, LEFT ATRIAL SIZE IS NORMAL, RV SYSTOLIC PRESSURE IS NORMAL. NO SIGN VALVULAR HEART DISEASE.   TUBAL LIGATION  1986   veins stripping      bilateral legs    Patient Active Problem List   Diagnosis Date Noted   Chronic rhinitis 11/18/2023   Hypertrophy of nasal turbinates 11/18/2023   Postnasal drip 11/18/2023   Neck pain 03/13/2023   Gait abnormality 01/23/2023   Diplopia 01/23/2023   Gastritis 04/12/2020   Serrated polyp of colon 04/12/2020   Constipation 02/23/2020   Melena 02/23/2020   Esophageal reflux 02/23/2020   Condyloma acuminatum    Encounter for colonoscopy due to history of adenomatous colonic polyps    Overweight (BMI 25.0-29.9) 03/09/2015   S/P left TKA 03/07/2015   Palpitations 05/23/2014   Expected blood loss anemia 09/01/2013   S/P right TKA 08/31/2013   DJD (degenerative joint disease) of knee 08/31/2013   Pre-operative clearance, cardiac 08/17/2013   HTN (hypertension) 05/20/2013   Hyperlipidemia 05/20/2013   Migraine 02/23/2007   ALLERGIC RHINITIS 02/23/2007   TRACHEOBRONCHITIS 02/23/2007   COUGH 02/23/2007    PCP: Bertell Satterfield, MD  REFERRING PROVIDER: Charmel Ivan Hamming, MD  REFERRING DIAG: G62.9 (ICD-10-CM) - Polyneuropathy, unspecified  Rationale for Evaluation and Treatment: Rehabilitation  THERAPY DIAG:  Impairment of balance  Leg weakness, bilateral  Impaired functional mobility, balance, gait, and endurance  ONSET DATE: 4-5 Months   SUBJECTIVE:                                                                                                                                                                                           SUBJECTIVE STATEMENT: Patient reports no change since last visit, HEP still going well. No falls or mishaps since last session. Went to water   aerobics this morning.  Goes 3 times a week.   EVAL: Patient reports she's  noticed she has tinging in feet, toes, that travels up to knees at times. Reports it makes her feel wobbly at times. Reports she has tried massage to her feet and LE and reports it may be helping some but not much. Harder to maintain balance in dark when she gets up in the middle of the night.   PERTINENT HISTORY:  Myastenia Gravis 2 TKR  PAIN:  Are you having pain? Yes: NPRS scale: 2/10 foot, 3/10 headache (constant) Pain location: Bottom of R foot, headache/neck pain Pain description: Achy, N/T Aggravating factors: Walking  Relieving factors: Rest  PRECAUTIONS: None  RED FLAGS: Neuropathy    WEIGHT BEARING RESTRICTIONS: No  FALLS:  Has patient fallen in last 6 months? Yes. Number of falls 1. A lot of near falls. On uneven surfaces.   LIVING ENVIRONMENT: Lives with: lives alone Stairs: Yes: Internal: 12 steps; on right going up and External: 6 steps; can reach both Has following equipment at home: None Has RW and SPC  OCCUPATION: Retired, Pension scheme manager  PLOF: Independent  PATIENT GOALS: To increase stability and see if we can figure out how to get Neuropathy pain from getting worse  NEXT MD VISIT: October 17th, 2025  OBJECTIVE:  Note: Objective measures were completed at Evaluation unless otherwise noted.  DIAGNOSTIC FINDINGS:    PATIENT SURVEYS:  Total ABC score: 1300 / 1600 = 81.3 % 12/09/23: ABC score: 1350/1600= 84.375  COGNITION: Overall cognitive status: Within functional limits for tasks assessed     SENSATION: WFL   POSTURE: rounded shoulders, forward head, and increased thoracic kyphosis  PALPATION:   LUMBAR ROM:   AROM eval  Flexion   Extension   Right lateral flexion   Left lateral flexion   Right rotation   Left rotation    (Blank rows = not tested)  LOWER EXTREMITY ROM:     Active  Right eval Left eval  Hip flexion    Hip extension     Hip abduction    Hip adduction    Hip internal rotation    Hip external rotation    Knee flexion    Knee extension    Ankle dorsiflexion    Ankle plantarflexion    Ankle inversion    Ankle eversion     (Blank rows = not tested)  LOWER EXTREMITY MMT:    MMT Right eval Left eval Right 12/09/23 Left  12/09/23  Hip flexion 4- 4- 4 4  Hip extension 3+ 3+ 4- 4+  Hip abduction 4- 4- 4- 4  Hip adduction      Hip internal rotation      Hip external rotation      Knee flexion 3+ 3+ 4- 4  Knee extension 4- 4- 4+ 5  Ankle dorsiflexion 5 5 5 5   Ankle plantarflexion      Ankle inversion      Ankle eversion       (Blank rows = not tested)  LUMBAR SPECIAL TESTS:    FUNCTIONAL TESTS:  4 Stage Balance: Side by side: 30 Semi-tandem: 30 ea LE leading Tandem:  30 ea LE leading  SLS: L- 22 R- 11  11/13/23: TUG: 7.58 seconds Functional Gait Assessment Summary 1. GAIT LEVEL SURFACE: Moderate impairment -- gait level surface (1)  (1 points) 2. CHANGE IN GAIT SPEED: Mild impairment -- change in gait speed (2)  (2 points) 3. GAIT WITH HORIZONTAL HEAD TURNS: Mild impairment -- gait with horizontal head turns (2)  (2 points) 4.  GAIT WITH VERTICAL HEAD TURNS: Moderate impairment -- gait with vertical head turns (1)  (1 points) 5. GAIT AND PIVOT TURN: Moderate impairment -- gait and pivot turn (1)  (1 points) 6. STEP OVER OBSTACLE: Normal -- step over obstacle (3)  (3 points) 7. GAIT WITH NARROW BASE OF SUPPORT: Mild impairment -- gait with narrow base of support (2)  (2 points) 8. GAIT WITH EYES CLOSED: Mild impairment -- gait with eyes closed (2)  (2 points) 9. AMBULATING BACKWARDS: Mild impairment -- ambulating backwards (2)  (2 points) 10. STEPS: Normal -- up and down steps (3)  (3 points) Functional Gait Assessment: 19/30=63.3 percent.  12/11/23 amd 12/09/23 TUG: 7.1 seconds SLS:  Rt:  30, Lt 30 FGA: 24/30 483ft Reviewed goals, feels she has  improved by 10-15% MMT see above ABC 1350/1600= 84.375 was 81.3  GAIT: Distance walked: 26' Assistive device utilized: None Level of assistance: Complete Independence Comments: Mildly unsteady at times  TREATMENT DATE:  12/22/23: GTB shoulder extension 2X10  2x10 feet on foam + marching GTB rows 2X10, feet on foam + marching Paloff press + mini lunge, foot leg on BOSU dome, 20x each way Walking lunges, 20 ftx 2, holding 5 lb. Kettlebell in one hand, CGA/min A Deep squats, 20 ft x 1, holding 10 lb. Kettebell Traveling Single leg RDL to pick up cones, 20 ft x 2   12/18/2023  Treadmill 4 minutes, grade 2.  Speed 1.3-1.15mph  last 2 minutes Heel/toe walks, 4 laps on 20 foot line, pt cued for keeping heels/toes off ground Lateral stepping with tidal tank with squat, 20 foot line; 1 RT rest between RTB rows 2X10 RTB shoulder extension 2X10 Leg press 2X10, 4 plates   05/16/7972  Therapeutic Exercise: -Treadmill 4 minutes, grade 2, speed 1.3 and 1.5 for last 30 seconds -Leg press, 2 sets of 10 reps, plate #4, plate #5 seconds set, pt cued for avoidance of knee lock out and eccentric control -Sit to stands, 2 sets of 5 reps, throughout session   Neuromuscular Re-education: -Heel/toe walks, 4 laps on 20 foot line, pt cued for keeping heels/toes off ground, last two laps with tidal tank column -Lateral stepping with tidal tank, 20 foot bout, pt demonstrates one loss of balance with need for mod therapist assist to keep balance -Standing shoulder rows/extensions, RTB at chest level with narrow base of support, 2 sets of 10 reps, seconds set standing on blue foam -Sliding towel lunge, 2 sets of 4 reps bilaterally, in parallel bars pt cued for decreased UE support    PATIENT EDUCATION:  Education details: PT evaluation, objective findings, POC, Importance of HEP, Precautions, Clinic policies  Person educated: Patient Education method: Explanation and Demonstration Education comprehension:  verbalized understanding and returned demonstration  HOME EXERCISE PROGRAM: Access Code: [email or web address] URL: [email or web address] Date: 11/06/2023 Prepared by: Rosaria Powell-Butler  Exercises - Sit to Stand  - 2 x daily - 7 x weekly - 3 sets - 10 reps - Standing Hip Extension with Counter Support  - 2 x daily - 7 x weekly - 3 sets - 10 reps - Side Stepping with Counter Support  - 2 x daily - 7 x weekly - 3 sets - 10 reps - Standing Tandem Balance with Counter Support  - 2 x daily - 7 x weekly - 3 sets - 30 hold - Standing Single Leg Stance with Counter Support  - 2 x daily - 7 x weekly - 3 sets - 30 hold  Access  Code: [email or web address] URL: [email or web address] Date: 12/18/2023 - Scapular Retraction with Resistance  - 2 x daily - 7 x weekly - 2 sets - 10 reps - Standing Shoulder Extension with Resistance  - 2 x daily - 7 x weekly - 2 sets - 10 reps  ASSESSMENT:  CLINICAL IMPRESSION: Patient tolerated session well. Focus continuing to be balance and overall strength. Patient demo inc difficulty with addition of foam pad and marching, requiring CGA. Improvements noted with verbal cues for core engagement. Further challenged balance with BOSU activity, pt required CGA initially but improvements noted with inc focus on activity. Required verbal cueing for form throughout. During lunges onto BOSU ball with paloff press, pt required CGA throughout and one instance of min A due to imbalance. Min A during walking lunges and single leg RDL due to imbalance. PT losing balance in multiple directions. Pt utilizes shoulder abduction usually to aid in balance during session. Multiple rest breaks needed throughout.  Patient would continue to benefit from skilled physical therapy for increased endurance with ambulation, increased LE strength, and improved balance for improved quality of life, improved independence with gait training and continued progress towards therapy goals.    Patient  is a 73 y.o. female who was seen today for physical therapy evaluation and treatment for G62.9 (ICD-10-CM) - Polyneuropathy, unspecified. Patient arrives to PT with relevant history of Myastenia Gravis and new polyneuropathy/peripheral neuropathy diagnosis. Patient reports her biggest issue this date is balance and endurance. On this date, patient demonstrates decreased LE strength, and deficits with static balance.  Patient educated on taking anticipatory breaks when performing her usual daily tasks to aid in decreasing chances of falls due to fatigue. Patient will benefit from skilled physical therapy in order to address strength, balance, and endurance to improve function/QOL.    OBJECTIVE IMPAIRMENTS: decreased activity tolerance, decreased balance, decreased endurance, difficulty walking, and decreased strength.   ACTIVITY LIMITATIONS: lifting, standing, squatting, stairs, transfers, and reach over head  PARTICIPATION LIMITATIONS: meal prep, cleaning, laundry, community activity, and yard work  PERSONAL FACTORS: N/A are also affecting patient's functional outcome.   REHAB POTENTIAL: Good  CLINICAL DECISION MAKING: Stable/uncomplicated  EVALUATION COMPLEXITY: Low   GOALS: Goals reviewed with patient? No  SHORT TERM GOALS: Target date: 11/20/23 Patient will be independent with performance of HEP to demonstrate adequate self management of symptoms.  Baseline: 12/09/23:  Reports compliance with HEP on regular basis Goal status: MET  2.   Patient will report at least a 25% improvement with function or pain overall since beginning PT. Baseline: 12/09/23:  Feels improvements 10-15% Goal status: In progress  LONG TERM GOALS: Target date: 01/08/24  Patient will improve ABC Scale by at least 10.5 points in order to demonstrate improved self-perceived function while meeting MCID (based on MCID for MS patients) Baseline:  12/09/23: ABC score: 1350/1600= 84.375 (was 1300 / 1600 = 81.3 %) Goal  status: In progress  2.  Patient will maintain single leg balance for 30 seconds bilaterally in order to demonstrate safety with single leg balancing tasks such as stair navigation.  Baseline: 12/09/23:test next session,  7/31:  30 seconds each Goal status: MET  3.  Patient will report 0 near falls within a weeks time in order to demonstrate improved risk of falls.  Baseline: 12/09/23:  No reports of falls in week, reports occasional feet crossing. Goal status: Ongoing  4. Patient will report at least a 50% improvement with function or pain overall since beginning PT. Baseline: 12/09/23:  Feels improvements 10-15% Goal status: Ongoing  PLAN:  PT FREQUENCY: 2x/week  PT DURATION: 6 weeks  PLANNED INTERVENTIONS: 97164- PT Re-evaluation, 97110-Therapeutic exercises, 97530- Therapeutic activity, [id number]- Neuromuscular re-education, 97535- Self Care, [id number]- Manual therapy, [id number]- Gait training, [phone]- Electrical stimulation (manual), [id number]- Traction (mechanical), [phone] (1-2 muscles), [id number] (3+ muscles)- Dry Needling, Patient/Family education, Balance training, Stair training, Taping, Joint mobilization, Spinal mobilization, Cryotherapy, and Moist heat.  PLAN FOR NEXT SESSION: Progress LE strength and balance.     11:50 AM, 12/22/23 Rosaria Settler, PT, DPT [facility] [place]

## 2023-12-23 ENCOUNTER — Encounter (HOSPITAL_COMMUNITY): Payer: Self-pay | Admitting: Internal Medicine

## 2023-12-23 ENCOUNTER — Ambulatory Visit (HOSPITAL_COMMUNITY): Admitting: Anesthesiology

## 2023-12-23 ENCOUNTER — Ambulatory Visit (HOSPITAL_COMMUNITY)
Admission: RE | Admit: 2023-12-23 | Discharge: 2023-12-23 | Disposition: A | Discharge status: Home / Self Care | Attending: Internal Medicine | Admitting: Internal Medicine

## 2023-12-23 ENCOUNTER — Other Ambulatory Visit: Payer: Self-pay

## 2023-12-23 ENCOUNTER — Encounter (HOSPITAL_COMMUNITY)
Admission: RE | Disposition: A | Discharge status: Home / Self Care | Payer: Self-pay | Source: Home / Self Care | Attending: Internal Medicine

## 2023-12-23 DIAGNOSIS — G7000 Myasthenia gravis without (acute) exacerbation: Secondary | ICD-10-CM | POA: Insufficient documentation

## 2023-12-23 DIAGNOSIS — I10 Essential (primary) hypertension: Secondary | ICD-10-CM | POA: Insufficient documentation

## 2023-12-23 DIAGNOSIS — Z87891 Personal history of nicotine dependence: Secondary | ICD-10-CM | POA: Diagnosis not present

## 2023-12-23 DIAGNOSIS — Z9889 Other specified postprocedural states: Secondary | ICD-10-CM | POA: Diagnosis not present

## 2023-12-23 DIAGNOSIS — K648 Other hemorrhoids: Secondary | ICD-10-CM

## 2023-12-23 DIAGNOSIS — Z1211 Encounter for screening for malignant neoplasm of colon: Secondary | ICD-10-CM

## 2023-12-23 DIAGNOSIS — Z860101 Personal history of adenomatous and serrated colon polyps: Secondary | ICD-10-CM | POA: Diagnosis not present

## 2023-12-23 DIAGNOSIS — K219 Gastro-esophageal reflux disease without esophagitis: Secondary | ICD-10-CM | POA: Insufficient documentation

## 2023-12-23 DIAGNOSIS — Z860100 Personal history of colon polyps, unspecified: Secondary | ICD-10-CM | POA: Diagnosis not present

## 2023-12-23 DIAGNOSIS — Z09 Encounter for follow-up examination after completed treatment for conditions other than malignant neoplasm: Secondary | ICD-10-CM | POA: Diagnosis not present

## 2023-12-23 HISTORY — PX: COLONOSCOPY: SHX5424

## 2023-12-23 SURGERY — COLONOSCOPY
Anesthesia: General

## 2023-12-23 MED ORDER — LACTATED RINGERS IV SOLN: INTRAVENOUS | Status: DC

## 2023-12-23 MED ORDER — PROPOFOL 500 MG/50ML IV EMUL
INTRAVENOUS | Status: DC | PRN
  Administered 2023-12-23: 100 mg via INTRAVENOUS
  Administered 2023-12-23 (×2): 100 ug/kg/min via INTRAVENOUS
  Administered 2023-12-23: 100 mg via INTRAVENOUS

## 2023-12-23 MED ORDER — ROSUVASTATIN CALCIUM 40 MG PO TABS: 40.0000 mg | ORAL_TABLET | Freq: Every day | ORAL | 3 refills | Status: AC

## 2023-12-23 NOTE — H&P (Signed)
 Primary Care Physician:  Bertell Satterfield, MD Primary Gastroenterologist:  Dr. Cindie  Pre-Procedure History & Physical: HPI:  Mary [name] is a 73 y.o. female is here for a colonoscopy to be performed for surveillance purposes, personal history of adenomatous colon polyps   Past Medical History:  Diagnosis Date   Anemia    Arthritis    Chronic kidney disease    idiopathic micro hematuria    Complication of anesthesia    patient states does not take much medicine to put her to sleep    Edema    ankles to knees    GERD (gastroesophageal reflux disease)    Headache(784.0)    hx of migraines    Hyperlipidemia    Mitral valve prolapse    no symptoms    Myasthenia gravis (HCC)    PONV (postoperative nausea and vomiting)    Scalp alopecia    Seizures (HCC)    1 seizure 45 years ago; unknown etiology and no meds, no seizures since then.    Past Surgical History:  Procedure Laterality Date   BIOPSY  03/24/2020   Procedure: BIOPSY;  Surgeon: Cindie Carlin POUR, DO;  [facility]: [facility];  Service: Endoscopy;;  gastric duodenum   CERVICAL FUSION  2006   cervical 5-6    COLONOSCOPY N/A 03/03/2017   Procedure: COLONOSCOPY;  Surgeon: Harvey Margo CROME, MD;  Location: [facility];  Service: Endoscopy;  Laterality: N/A;  1:15 pm   COLONOSCOPY WITH PROPOFOL  N/A 03/24/2020   Procedure: COLONOSCOPY WITH PROPOFOL ;  Surgeon: Cindie Carlin POUR, DO;  [facility];  Service: Endoscopy;  Laterality: N/A;  10:00am   COLONOSCOPY WITH PROPOFOL  N/A 10/02/2020   Procedure: COLONOSCOPY WITH PROPOFOL ;  Surgeon: Cindie Carlin POUR, DO;  [facility];  Service: Endoscopy;  Laterality: N/A;  ASA II / 9:00   ESOPHAGOGASTRODUODENOSCOPY (EGD) WITH PROPOFOL  N/A 03/24/2020   Procedure: ESOPHAGOGASTRODUODENOSCOPY (EGD) WITH PROPOFOL ;  Surgeon: Cindie Carlin POUR, DO;  [facility];  Service: Endoscopy;  Laterality: N/A;   HEMORRHOID SURGERY N/A 06/23/2017   Procedure: ANOSCOPY  WITH REMOVAL OF SINGLE LESION;  Surgeon: Mavis Anes, MD;  Location: [facility];  Service: General;  Laterality: N/A;   MYOCARDIAL PERFUSION STUDY  07/02/2011   NO SCINTIGRAPHIC EVIDENCE OF INDUCIBLE MYOCARDIAL ISCHEMIA. POST-STRESS EF IS 87%.EXERCISE CAPACITY 9 METS. EKG SHOWS NSR AT 69. THERE IS HORIZONTAL ST DEPRESSION WITH EXERCISE IN II, III, AVF SEEN BEST AT EARLY RECOVERY. THIS IS PRESENT IN V3-V5 AS WELL AND RETURNS TO BASELINE AT THE END OF THE TEST. NORMAL STUDY.   POLYPECTOMY  03/24/2020   Procedure: POLYPECTOMY;  Surgeon: Cindie Carlin POUR, DO;  [facility];  Service: Endoscopy;;  colon    POLYPECTOMY  10/02/2020   Procedure: POLYPECTOMY;  Surgeon: Cindie Carlin POUR, DO;  [facility];  Service: Endoscopy;;   RENAL ARTERY DOPPLER  12/28/2003   CELIAC ARTERY: AT REST, 168.4 CM/S; INSPRIATION, 177.6 CM/S. ARCUATE LIGAMENT COMPRESSION SYNDROME. R & L KIDNEYS: RIGHT KIDNEY MEASURES SOME WHAT SMALLER THAN LEFT. KIDNEY ARE ESSENTIALLY SYMMETRICAL IN SHAPE WITH NO OBVIOUS ABNORMALITY VISUALIZED. R & L RENAL ARTERIES: DEMONSTRATE NORMAL SPECTRA . NO SUGGESTION OF DIAMETER REDUCTION, DISSECTION, FIBROMUSCULAR DYSPLASIA OR ANY OTHER VASCULAR ABNOR   TOTAL KNEE ARTHROPLASTY Right 08/31/2013   Procedure: RIGHT TOTAL KNEE ARTHROPLASTY;  Surgeon: Donnice JONETTA Car, MD;  Location: [facility];  Service: Orthopedics;  Laterality: Right;   TOTAL KNEE ARTHROPLASTY Left 03/07/2015   Procedure:  TOTAL KNEE ARTHROPLASTY;  Surgeon: Donnice Car, MD;  Location: [facility];  Service: Orthopedics;  Laterality: Left;   TRANSTHORACIC ECHOCARDIOGRAM  07/02/2011   LV SYSTOLIC FUNCTION NORMAL, EF=>55%, INSIGNIFICANT PERICARDIAL EFFUSION, LEFT ATRIAL SIZE IS NORMAL, RV SYSTOLIC PRESSURE IS NORMAL. NO SIGN VALVULAR HEART DISEASE.   TUBAL LIGATION  1986   veins stripping      bilateral legs     Prior to Admission medications   Medication Sig Start Date End Date Taking? Authorizing Provider  docusate sodium   (COLACE) 100 MG capsule Take 200 mg by mouth daily.   Yes [provider]  EMGALITY 120 MG/ML SOAJ Inject 120 mg into the skin every 30 (thirty) days.  09/09/19  Yes [provider]  ezetimibe  (ZETIA ) 10 MG tablet TAKE 1 TABLET BY MOUTH ONCE A DAY. 09/01/23  Yes Burnard Debby LABOR, MD  fexofenadine (ALLEGRA) 180 MG tablet Take 180 mg by mouth daily.   Yes [provider]  ipratropium (ATROVENT) 0.06 % nasal spray Place 2 sprays into both nostrils daily.   Yes [provider]  linaclotide  (LINZESS ) 145 MCG CAPS capsule Take 1 capsule (145 mcg total) by mouth daily before breakfast. 11/28/23 02/26/24 Yes Uno Esau K, DO  Magnesium  Gluconate 250 MG TABS Take 250 mg by mouth daily at 12 noon.    Yes [provider]  Multiple Vitamin (MULTIVITAMIN WITH MINERALS) TABS tablet Take 1 tablet by mouth daily. Centrum Silver   Yes [provider]  MYRBETRIQ 50 MG TB24 tablet Take 50 mg by mouth daily. 09/10/21  Yes [provider]  omeprazole  (PRILOSEC) 40 MG capsule Take 1 capsule (40 mg total) by mouth daily. 11/12/23 11/11/24 Yes Xiao Graul K, DO  OVER THE COUNTER MEDICATION Take 4 capsules by mouth in the morning and at bedtime. kidney stuff Natural   Yes [provider]  Polyethyl Glycol-Propyl Glycol (SYSTANE OP) Place 1 drop into both eyes 2 (two) times daily.   Yes [provider]  pyridOXINE (VITAMIN B-6) 100 MG tablet Take 100 mg by mouth daily.   Yes [provider]  SUMAtriptan  (IMITREX ) 100 MG tablet Take 100 mg by mouth as needed for migraine. May repeat in 2 hours if headache persists or recurs.   Yes [provider]  SUMAtriptan  Succinate Refill 4 MG/0.5ML SOCT Inject 4 mg into the skin as needed (for migraines).   Yes [provider]  Ferrous Sulfate  (IRON) 325 (65 Fe) MG TABS Take 325 mg by mouth at bedtime.    [provider]  Na Sulfate-K Sulfate-Mg Sulfate concentrate (SUPREP)  17.5-3.13-1.6 GM/177ML SOLN As directed 11/20/23   Cindie Carlin POUR, DO  rosuvastatin  (CRESTOR ) 40 MG tablet Take 1 tablet (40 mg total) by mouth at bedtime. 12/23/23   Emelia Josefa HERO, NP    Allergies as of 11/20/2023 - Review Complete 11/18/2023  Allergen Reaction Noted   Itraconazole Other (See Comments) and Swelling 05/20/2013   Amlodipine   08/12/2022   Amlodipine  besy-benazepril  hcl Cough 05/13/2012   Nsaids Other (See Comments) 09/25/2020   Metoprolol  Rash 02/28/2015    Family History  Problem Relation Age of Onset   Dementia Mother    Heart attack Father    Heart disease Father    Kidney failure Father    Heart attack Brother        age 60   Cancer - Other Brother        kidney   Colon cancer Paternal Aunt  Social History   Socioeconomic History   Marital status: Widowed    Spouse name: Not on file   Number of children: Not on file   Years of education: Not on file   Highest education level: Not on file  Occupational History   Not on file  Tobacco Use   Smoking status: Former    Current packs/day: 0.00    Average packs/day: 2.0 packs/day for 22.0 years (44.0 ttl pk-yrs)    Types: Cigarettes    Start date: 05/13/1966    Quit date: 05/13/1988    Years since quitting: 35.6   Smokeless tobacco: Never  Vaping Use   Vaping status: Never Used  Substance and Sexual Activity   Alcohol use: Yes    Comment: wine/beer; couple times/month   Drug use: No   Sexual activity: Not Currently    Birth control/protection: Post-menopausal  Other Topics Concern   Not on file  Social History Narrative   Not on file   Social Drivers of Health   Financial Resource Strain: Not on file  Food Insecurity: Not on file  Transportation Needs: Not on file  Physical Activity: Not on file  Stress: Not on file  Social Connections: Not on file  Intimate Partner Violence: Not on file    Review of Systems: See HPI, otherwise negative ROS  Physical Exam: Vital signs in last 24  hours: Temp:  [97.9 F (36.6 C)] 97.9 F (36.6 C) (08/12 0958) Pulse Rate:  [71] 71 (08/12 0958) Resp:  [17] 17 (08/12 0958) BP: (134)/(53) 134/53 (08/12 0958) SpO2:  [97 %] 97 % (08/12 0958) Weight:  [64 kg] 64 kg (08/12 0958)   General:   Alert,  Well-developed, well-nourished, pleasant and cooperative in NAD Head:  Normocephalic and atraumatic. Eyes:  Sclera clear, no icterus.   Conjunctiva pink. Ears:  Normal auditory acuity. Nose:  No deformity, discharge,  or lesions. Msk:  Symmetrical without gross deformities. Normal posture. Extremities:  Without clubbing or edema. Neurologic:  Alert and  oriented x4;  grossly normal neurologically. Skin:  Intact without significant lesions or rashes. Psych:  Alert and cooperative. Normal mood and affect.  Impression/Plan: Mary [name] is here for a colonoscopy to be performed for surveillance purposes, personal history of adenomatous colon polyps   The risks of the procedure including infection, bleed, or perforation as well as benefits, limitations, alternatives and imponderables have been reviewed with the patient. Questions have been answered. All parties agreeable.

## 2023-12-23 NOTE — Discharge Instructions (Addendum)
  Colonoscopy Discharge Instructions  Read the instructions outlined below and refer to this sheet in the next few weeks. These discharge instructions provide you with general information on caring for yourself after you leave the hospital. Your doctor may also give you specific instructions. While your treatment has been planned according to the most current medical practices available, unavoidable complications occasionally occur.   ACTIVITY You may resume your regular activity, but move at a slower pace for the next 24 hours.  Take frequent rest periods for the next 24 hours.  Walking will help get rid of the air and reduce the bloated feeling in your belly (abdomen).  No driving for 24 hours (because of the medicine (anesthesia) used during the test).   Do not sign any important legal documents or operate any machinery for 24 hours (because of the anesthesia used during the test).  NUTRITION Drink plenty of fluids.  You may resume your normal diet as instructed by your doctor.  Begin with a light meal and progress to your normal diet. Heavy or fried foods are harder to digest and may make you feel sick to your stomach (nauseated).  Avoid alcoholic beverages for 24 hours or as instructed.  MEDICATIONS You may resume your normal medications unless your doctor tells you otherwise.  WHAT YOU CAN EXPECT TODAY Some feelings of bloating in the abdomen.  Passage of more gas than usual.  Spotting of blood in your stool or on the toilet paper.  IF YOU HAD POLYPS REMOVED DURING THE COLONOSCOPY: No aspirin  products for 7 days or as instructed.  No alcohol for 7 days or as instructed.  Eat a soft diet for the next 24 hours.  FINDING OUT THE RESULTS OF YOUR TEST Not all test results are available during your visit. If your test results are not back during the visit, make an appointment with your caregiver to find out the results. Do not assume everything is normal if you have not heard from your  caregiver or the medical facility. It is important for you to follow up on all of your test results.  SEEK IMMEDIATE MEDICAL ATTENTION IF: You have more than a spotting of blood in your stool.  Your belly is swollen (abdominal distention).  You are nauseated or vomiting.  You have a temperature over 101.  You have abdominal pain or discomfort that is severe or gets worse throughout the day.   Overall your colon looked great.  I did not find any polyps or evidence of colon cancer.  Previous post polypectomy scar looks good from large polyp removed in 2021.  Given your age, I do not think you need further colonoscopies for surveillance purposes.  Follow-up in GI office in 6 months.  I hope you have a great rest of your week!  Carlin POUR. Cindie, D.O. Gastroenterology and Hepatology [facility]

## 2023-12-23 NOTE — Op Note (Signed)
 [facility] Patient Name: Mary [name] Procedure Date: 12/23/2023 10:33 AM MRN: [id number] Date of Birth: Jun 28, 1950 Attending MD: Carlin POUR. Cindie , [place], [id number] CSN: [id number] Age: 73 Admit Type: Outpatient Procedure:                Colonoscopy Indications:              Surveillance: Personal history of colonic polyps                            (unknown histology) on last colonoscopy 3 years ago Providers:                Carlin POUR. Cindie, DO, Crystal Page, Jon Loge Referring MD:              Medicines:                See the Anesthesia note for documentation of the                            administered medications Complications:            No immediate complications. Estimated Blood Loss:     Estimated blood loss: none. Procedure:                Pre-Anesthesia Assessment:                           - The anesthesia plan was to use monitored                            anesthesia care (MAC).                           After obtaining informed consent, the colonoscope                            was passed under direct vision. Throughout the                            procedure, the patient's blood pressure, pulse, and                            oxygen saturations were monitored continuously. The                            [id number]-HQ190L ([id number]) scope was introduced through                            the anus and advanced to the the terminal ileum,                            with identification of the appendiceal orifice and                            IC valve. The colonoscopy was  performed without                            difficulty. The patient tolerated the procedure                            well. The quality of the bowel preparation was                            evaluated using the BBPS [facility] Bowel Preparation                            Scale) with scores of: Right Colon = 3, Transverse                            Colon = 3 and Left  Colon = 3 (entire mucosa seen                            well with no residual staining, small fragments of                            stool or opaque liquid). The total BBPS score                            equals 9. Scope In: 10:45:20 AM Scope Out: 11:02:30 AM Scope Withdrawal Time: 0 hours 9 minutes 13 seconds  Total Procedure Duration: 0 hours 17 minutes 10 seconds  Findings:      Non-bleeding internal hemorrhoids were found.      A post polypectomy scar with tattoo distal was found in the transverse       colon. The scar tissue was healthy in appearance. There was no evidence       of the previous polyp.      The terminal ileum appeared normal.      The exam was otherwise without abnormality. Impression:               - Non-bleeding internal hemorrhoids.                           - Post-polypectomy scar in the transverse colon.                           - The examined portion of the ileum was normal.                           - The examination was otherwise normal.                           - No specimens collected. Moderate Sedation:      Per Anesthesia Care Recommendation:           - Patient has a contact number available for                            emergencies. The signs and symptoms of potential  delayed complications were discussed with the                            patient. Return to normal activities tomorrow.                            Written discharge instructions were provided to the                            patient.                           - Resume previous diet.                           - Continue present medications.                           - No repeat colonoscopy due to age.                           - Return to GI clinic in 6 months. Procedure Code(s):        --- Professional ---                           H9894, Colorectal cancer screening; colonoscopy on                            individual at high risk Diagnosis Code(s):         --- Professional ---                           Z86.010, Personal history of colonic polyps                           Z98.890, Other specified postprocedural states                           K64.8, Other hemorrhoids CPT copyright 2022 American [employer] Association. All rights reserved. The codes documented in this report are preliminary and upon coder review may  be revised to meet current compliance requirements. Carlin POUR. Cindie, DO Carlin POUR. Cindie, DO 12/23/2023 11:07:03 AM This report has been signed electronically. Number of Addenda: 0

## 2023-12-23 NOTE — Anesthesia Procedure Notes (Signed)
 Date/Time: 12/23/2023 10:38 AM  Performed by: Barbarann Verneita RAMAN, CRNAPre-anesthesia Checklist: Patient identified, Emergency Drugs available, Suction available, Timeout performed and Patient being monitored Patient Re-evaluated:Patient Re-evaluated prior to induction Oxygen Delivery Method: Nasal Cannula

## 2023-12-23 NOTE — Transfer of Care (Signed)
 Immediate Anesthesia Transfer of Care Note  Patient: Mary [name]  Procedure(s) Performed: COLONOSCOPY  Patient Location: Endoscopy Unit  Anesthesia Type:General  Level of Consciousness: drowsy and patient cooperative  Airway & Oxygen Therapy: Patient Spontanous Breathing  Post-op Assessment: Report given to RN and Post -op Vital signs reviewed and stable  Post vital signs: Reviewed and stable  Last Vitals:  Vitals Value Taken Time  BP 104/45 12/23/23 11:06  Temp 36.4 C 12/23/23 11:06  Pulse 64 12/23/23 11:06  Resp 20 12/23/23 11:06  SpO2 98 % 12/23/23 11:06    Last Pain:  Vitals:   12/23/23 1106  TempSrc: Oral  PainSc: 0-No pain      Patients Stated Pain Goal: 6 (12/23/23 0958)  Complications: No notable events documented.

## 2023-12-23 NOTE — Anesthesia Postprocedure Evaluation (Signed)
 Anesthesia Post Note  Patient: Mary [name]  Procedure(s) Performed: COLONOSCOPY  Patient location during evaluation: Endoscopy Anesthesia Type: General Level of consciousness: awake and alert Pain management: pain level controlled Vital Signs Assessment: post-procedure vital signs reviewed and stable Respiratory status: spontaneous breathing, nonlabored ventilation and respiratory function stable Cardiovascular status: blood pressure returned to baseline and stable Postop Assessment: no apparent nausea or vomiting Anesthetic complications: no   There were no known notable events for this encounter.   Last Vitals:  Vitals:   12/23/23 1106 12/23/23 1109  BP: (!) 104/45 (!) 108/49  Pulse: 64 66  Resp: 20 20  Temp: 36.4 C   SpO2: 98% 99%    Last Pain:  Vitals:   12/23/23 1109  TempSrc:   PainSc: 0-No pain                 [name] L Vinh Sachs

## 2023-12-23 NOTE — Anesthesia Preprocedure Evaluation (Signed)
 Anesthesia Evaluation  Patient identified by MRN, date of birth, ID band Patient awake    Reviewed: Allergy & Precautions, NPO status , Patient's Chart, lab work & pertinent test results  History of Anesthesia Complications (+) PONV and history of anesthetic complications  Airway Mallampati: II  TM Distance: >3 FB Neck ROM: Full    Dental no notable dental hx. (+) Dental Advisory Given, Teeth Intact Root canal:   Pulmonary former smoker   Pulmonary exam normal breath sounds clear to auscultation       Cardiovascular Exercise Tolerance: Good hypertension (patient BP meds for migraine), Normal cardiovascular exam+ Valvular Problems/Murmurs MVP  Rhythm:Regular Rate:Normal     Neuro/Psych  Headaches, Seizures - (last seizure  45 years ago), Well Controlled,   Neuromuscular disease    GI/Hepatic ,GERD  Controlled,,  Endo/Other    Renal/GU Renal InsufficiencyRenal disease     Musculoskeletal  (+) Arthritis , Osteoarthritis,    Abdominal   Peds  Hematology   Anesthesia Other Findings Myasthenia Gravis  Reproductive/Obstetrics                              Anesthesia Physical Anesthesia Plan  ASA: 3  Anesthesia Plan: General   Post-op Pain Management: Minimal or no pain anticipated   Induction: Intravenous  PONV Risk Score and Plan: Propofol  infusion  Airway Management Planned: Nasal Cannula and Natural Airway  Additional Equipment: None  Intra-op Plan:   Post-operative Plan:   Informed Consent: I have reviewed the patients History and Physical, chart, labs and discussed the procedure including the risks, benefits and alternatives for the proposed anesthesia with the patient or authorized representative who has indicated his/her understanding and acceptance.     Dental advisory given  Plan Discussed with: CRNA  Anesthesia Plan Comments:          Anesthesia Quick  Evaluation

## 2023-12-24 ENCOUNTER — Encounter (HOSPITAL_COMMUNITY): Payer: Self-pay | Admitting: Internal Medicine

## 2023-12-26 ENCOUNTER — Encounter (HOSPITAL_COMMUNITY): Payer: Self-pay

## 2023-12-26 ENCOUNTER — Ambulatory Visit (HOSPITAL_COMMUNITY)

## 2023-12-26 DIAGNOSIS — R2689 Other abnormalities of gait and mobility: Secondary | ICD-10-CM | POA: Diagnosis not present

## 2023-12-26 DIAGNOSIS — Z7409 Other reduced mobility: Secondary | ICD-10-CM

## 2023-12-26 DIAGNOSIS — R29898 Other symptoms and signs involving the musculoskeletal system: Secondary | ICD-10-CM

## 2023-12-26 NOTE — Therapy (Signed)
 OUTPATIENT PHYSICAL THERAPY THORACOLUMBAR TREATMENT    Patient Name: Mary [name] MRN: [id number] DOB:26-Jul-1950, 73 y.o., female Today's Date: 12/26/2023  END OF SESSION:  PT End of Session - 12/26/23 1302     Visit Number 11    Number of Visits 12    Date for PT Re-Evaluation 12/30/23    Authorization Type [employer] medicare    Authorization Time Period cohere approved 12 visits from 11/06/2023-01/05/2024    Authorization - Visit Number 11    Authorization - Number of Visits 12    Progress Note Due on Visit 16    PT Start Time 1302    PT Stop Time 1342    PT Time Calculation (min) 40 min    Activity Tolerance Patient tolerated treatment well    Behavior During Therapy WFL for tasks assessed/performed            Past Medical History:  Diagnosis Date   Anemia    Arthritis    Chronic kidney disease    idiopathic micro hematuria    Complication of anesthesia    patient states does not take much medicine to put her to sleep    Edema    ankles to knees    GERD (gastroesophageal reflux disease)    Headache(784.0)    hx of migraines    Hyperlipidemia    Mitral valve prolapse    no symptoms    Myasthenia gravis (HCC)    PONV (postoperative nausea and vomiting)    Scalp alopecia    Seizures (HCC)    1 seizure 45 years ago; unknown etiology and no meds, no seizures since then.   Past Surgical History:  Procedure Laterality Date   BIOPSY  03/24/2020   Procedure: BIOPSY;  Surgeon: Cindie Carlin POUR, DO;  Location: [facility];  Service: Endoscopy;;  gastric duodenum   CERVICAL FUSION  2006   cervical 5-6    COLONOSCOPY N/A 03/03/2017   Procedure: COLONOSCOPY;  Surgeon: Harvey Margo CROME, MD;  Location: [facility];  Service: Endoscopy;  Laterality: N/A;  1:15 pm   COLONOSCOPY N/A 12/23/2023   Procedure: COLONOSCOPY;  Surgeon: Cindie Carlin POUR, DO;  Location: [facility];  Service: Endoscopy;  Laterality: N/A;  11:00 am, asa 2   COLONOSCOPY WITH PROPOFOL  N/A  03/24/2020   Procedure: COLONOSCOPY WITH PROPOFOL ;  Surgeon: Cindie Carlin POUR, DO;  Location: [facility];  Service: Endoscopy;  Laterality: N/A;  10:00am   COLONOSCOPY WITH PROPOFOL  N/A 10/02/2020   Procedure: COLONOSCOPY WITH PROPOFOL ;  Surgeon: Cindie Carlin POUR, DO;  [facility]: [facility];  Service: Endoscopy;  Laterality: N/A;  ASA II / 9:00   ESOPHAGOGASTRODUODENOSCOPY (EGD) WITH PROPOFOL  N/A 03/24/2020   Procedure: ESOPHAGOGASTRODUODENOSCOPY (EGD) WITH PROPOFOL ;  Surgeon: Cindie Carlin POUR, DO;  Location: [facility];  Service: Endoscopy;  Laterality: N/A;   HEMORRHOID SURGERY N/A 06/23/2017   Procedure: ANOSCOPY WITH REMOVAL OF SINGLE LESION;  Surgeon: Mavis Anes, MD;  Location: [facility];  Service: General;  Laterality: N/A;   MYOCARDIAL PERFUSION STUDY  07/02/2011   NO SCINTIGRAPHIC EVIDENCE OF INDUCIBLE MYOCARDIAL ISCHEMIA. POST-STRESS EF IS 87%.EXERCISE CAPACITY 9 METS. EKG SHOWS NSR AT 69. THERE IS HORIZONTAL ST DEPRESSION WITH EXERCISE IN II, III, AVF SEEN BEST AT EARLY RECOVERY. THIS IS PRESENT IN V3-V5 AS WELL AND RETURNS TO BASELINE AT THE END OF THE TEST. NORMAL STUDY.   POLYPECTOMY  03/24/2020   Procedure: POLYPECTOMY;  Surgeon: Cindie Carlin POUR, DO;  Location:  [facility];  Service: Endoscopy;;  colon    POLYPECTOMY  10/02/2020   Procedure: POLYPECTOMY;  Surgeon: Cindie Carlin POUR, DO;  Location: [facility];  Service: Endoscopy;;   RENAL ARTERY DOPPLER  12/28/2003   CELIAC ARTERY: AT REST, 168.4 CM/S; INSPRIATION, 177.6 CM/S. ARCUATE LIGAMENT COMPRESSION SYNDROME. R & L KIDNEYS: RIGHT KIDNEY MEASURES SOME WHAT SMALLER THAN LEFT. KIDNEY ARE ESSENTIALLY SYMMETRICAL IN SHAPE WITH NO OBVIOUS ABNORMALITY VISUALIZED. R & L RENAL ARTERIES: DEMONSTRATE NORMAL SPECTRA . NO SUGGESTION OF DIAMETER REDUCTION, DISSECTION, FIBROMUSCULAR DYSPLASIA OR ANY OTHER VASCULAR ABNOR   TOTAL KNEE ARTHROPLASTY Right 08/31/2013   Procedure: RIGHT TOTAL KNEE ARTHROPLASTY;  Surgeon: Donnice JONETTA Car, MD;  Location: [facility];  Service: Orthopedics;  Laterality: Right;   TOTAL KNEE ARTHROPLASTY Left 03/07/2015   Procedure: TOTAL KNEE ARTHROPLASTY;  Surgeon: Donnice Car, MD;  Location: [facility];  Service: Orthopedics;  Laterality: Left;   TRANSTHORACIC ECHOCARDIOGRAM  07/02/2011   LV SYSTOLIC FUNCTION NORMAL, EF=>55%, INSIGNIFICANT PERICARDIAL EFFUSION, LEFT ATRIAL SIZE IS NORMAL, RV SYSTOLIC PRESSURE IS NORMAL. NO SIGN VALVULAR HEART DISEASE.   TUBAL LIGATION  1986   veins stripping      bilateral legs    Patient Active Problem List   Diagnosis Date Noted   Chronic rhinitis 11/18/2023   Hypertrophy of nasal turbinates 11/18/2023   Postnasal drip 11/18/2023   Neck pain 03/13/2023   Gait abnormality 01/23/2023   Diplopia 01/23/2023   Gastritis 04/12/2020   Serrated polyp of colon 04/12/2020   Constipation 02/23/2020   Melena 02/23/2020   Esophageal reflux 02/23/2020   Condyloma acuminatum    Encounter for colonoscopy due to history of adenomatous colonic polyps    Overweight (BMI 25.0-29.9) 03/09/2015   S/P left TKA 03/07/2015   Palpitations 05/23/2014   Expected blood loss anemia 09/01/2013   S/P right TKA 08/31/2013   DJD (degenerative joint disease) of knee 08/31/2013   Pre-operative clearance, cardiac 08/17/2013   HTN (hypertension) 05/20/2013   Hyperlipidemia 05/20/2013   Migraine 02/23/2007   ALLERGIC RHINITIS 02/23/2007   TRACHEOBRONCHITIS 02/23/2007   COUGH 02/23/2007    PCP: Bertell Satterfield, MD  REFERRING PROVIDER: Charmel Ivan Hamming, MD  REFERRING DIAG: G62.9 (ICD-10-CM) - Polyneuropathy, unspecified  Rationale for Evaluation and Treatment: Rehabilitation  THERAPY DIAG:  Impairment of balance  Leg weakness, bilateral  Impaired functional mobility, balance, gait, and endurance  ONSET DATE: 4-5 Months   SUBJECTIVE:  SUBJECTIVE STATEMENT: Wishes to work on posture as that assist with back pain.  Reports less leg fatigue with activities.  Balance feels better, no reports of recent falls. Has headache every day.   EVAL: Patient reports she's noticed she has tinging in feet, toes, that travels up to knees at times. Reports it makes her feel wobbly at times. Reports she has tried massage to her feet and LE and reports it may be helping some but not much. Harder to maintain balance in dark when she gets up in the middle of the night.   PERTINENT HISTORY:  Myastenia Gravis 2 TKR  PAIN:  Are you having pain? Yes: NPRS scale: 2/10 leg, 3/10 headache (constant) Pain location: Bottom of R foot, headache/neck pain Pain description: Achy, N/T Aggravating factors: Walking  Relieving factors: Rest  PRECAUTIONS: None  RED FLAGS: Neuropathy    WEIGHT BEARING RESTRICTIONS: No  FALLS:  Has patient fallen in last 6 months? Yes. Number of falls 1. A lot of near falls. On uneven surfaces.   LIVING ENVIRONMENT: Lives with: lives alone Stairs: Yes: Internal: 12 steps; on right going up and External: 6 steps; can reach both Has following equipment at home: None Has RW and SPC  OCCUPATION: Retired, Pension scheme manager  PLOF: Independent  PATIENT GOALS: To increase stability and see if we can figure out how to get Neuropathy pain from getting worse  NEXT MD VISIT: October 17th, 2025  OBJECTIVE:  Note: Objective measures were completed at Evaluation unless otherwise noted.  DIAGNOSTIC FINDINGS:    PATIENT SURVEYS:  Total ABC score: 1300 / 1600 = 81.3 % 12/09/23: ABC score: 1350/1600= 84.375  COGNITION: Overall cognitive status: Within functional limits for tasks assessed     SENSATION: WFL   POSTURE: rounded shoulders, forward head, and increased thoracic kyphosis  PALPATION:   LUMBAR ROM:   AROM eval  Flexion   Extension    Right lateral flexion   Left lateral flexion   Right rotation   Left rotation    (Blank rows = not tested)  LOWER EXTREMITY ROM:     Active  Right eval Left eval  Hip flexion    Hip extension    Hip abduction    Hip adduction    Hip internal rotation    Hip external rotation    Knee flexion    Knee extension    Ankle dorsiflexion    Ankle plantarflexion    Ankle inversion    Ankle eversion     (Blank rows = not tested)  LOWER EXTREMITY MMT:    MMT Right eval Left eval Right 12/09/23 Left  12/09/23  Hip flexion 4- 4- 4 4  Hip extension 3+ 3+ 4- 4+  Hip abduction 4- 4- 4- 4  Hip adduction      Hip internal rotation      Hip external rotation      Knee flexion 3+ 3+ 4- 4  Knee extension 4- 4- 4+ 5  Ankle dorsiflexion 5 5 5 5   Ankle plantarflexion      Ankle inversion      Ankle eversion       (Blank rows = not tested)  LUMBAR SPECIAL TESTS:    FUNCTIONAL TESTS:  4 Stage Balance: Side by side: 30 Semi-tandem: 30 ea LE leading Tandem:  30 ea LE leading  SLS: L- 22 R- 11  11/13/23: TUG: 7.58 seconds Functional Gait Assessment Summary 1. GAIT LEVEL SURFACE: Moderate impairment -- gait level surface (  1)  (1 points) 2. CHANGE IN GAIT SPEED: Mild impairment -- change in gait speed (2)  (2 points) 3. GAIT WITH HORIZONTAL HEAD TURNS: Mild impairment -- gait with horizontal head turns (2)  (2 points) 4. GAIT WITH VERTICAL HEAD TURNS: Moderate impairment -- gait with vertical head turns (1)  (1 points) 5. GAIT AND PIVOT TURN: Moderate impairment -- gait and pivot turn (1)  (1 points) 6. STEP OVER OBSTACLE: Normal -- step over obstacle (3)  (3 points) 7. GAIT WITH NARROW BASE OF SUPPORT: Mild impairment -- gait with narrow base of support (2)  (2 points) 8. GAIT WITH EYES CLOSED: Mild impairment -- gait with eyes closed (2)  (2 points) 9. AMBULATING BACKWARDS: Mild impairment -- ambulating backwards (2)  (2 points) 10. STEPS: Normal -- up  and down steps (3)  (3 points) Functional Gait Assessment: 19/30=63.3 percent.  12/11/23 amd 12/09/23 TUG: 7.1 seconds SLS:  Rt:  30, Lt 30 FGA: 24/30 416ft Reviewed goals, feels she has improved by 10-15% MMT see above ABC 1350/1600= 84.375 was 81.3  GAIT: Distance walked: 4' Assistive device utilized: None Level of assistance: Complete Independence Comments: Mildly unsteady at times  TREATMENT DATE:  12/26/23: UBE backwards x4' L2 Wall push-ups 10x 2 sets 5 holds GTB shoulder extension 2X10  2x10 feet on foam + marching Lunge onto BOSU with Bil UE overhead 10x 5 Kettlebell swing 10# 2x 10; cueing for hip hinge Balance beam with 12in hurdles 3RT  12/22/23: GTB shoulder extension 2X10  2x10 feet on foam + marching GTB rows 2X10, feet on foam + marching Paloff press + mini lunge, foot leg on BOSU dome, 20x each way Walking lunges, 20 ftx 2, holding 5 lb. Kettlebell in one hand, CGA/min A Deep squats, 20 ft x 1, holding 10 lb. Kettebell Traveling Single leg RDL to pick up cones, 20 ft x 2   12/18/2023  Treadmill 4 minutes, grade 2.  Speed 1.3-1.69mph  last 2 minutes Heel/toe walks, 4 laps on 20 foot line, pt cued for keeping heels/toes off ground Lateral stepping with tidal tank with squat, 20 foot line; 1 RT rest between RTB rows 2X10 RTB shoulder extension 2X10 Leg press 2X10, 4 plates   05/16/7972  Therapeutic Exercise: -Treadmill 4 minutes, grade 2, speed 1.3 and 1.5 for last 30 seconds -Leg press, 2 sets of 10 reps, plate #4, plate #5 seconds set, pt cued for avoidance of knee lock out and eccentric control -Sit to stands, 2 sets of 5 reps, throughout session   Neuromuscular Re-education: -Heel/toe walks, 4 laps on 20 foot line, pt cued for keeping heels/toes off ground, last two laps with tidal tank column -Lateral stepping with tidal tank, 20 foot bout, pt demonstrates one loss of balance with need for mod therapist assist to keep balance -Standing  shoulder rows/extensions, RTB at chest level with narrow base of support, 2 sets of 10 reps, seconds set standing on blue foam -Sliding towel lunge, 2 sets of 4 reps bilaterally, in parallel bars pt cued for decreased UE support    PATIENT EDUCATION:  Education details: PT evaluation, objective findings, POC, Importance of HEP, Precautions, Clinic policies  Person educated: Patient Education method: Explanation and Demonstration Education comprehension: verbalized understanding and returned demonstration  HOME EXERCISE PROGRAM: Access Code: [email or web address] URL: [email or web address] Date: 11/06/2023 Prepared by: Rosaria Powell-Butler  Exercises - Sit to Stand  - 2 x daily - 7 x weekly - 3 sets - 10 reps - Standing Hip  Extension with Counter Support  - 2 x daily - 7 x weekly - 3 sets - 10 reps - Side Stepping with Counter Support  - 2 x daily - 7 x weekly - 3 sets - 10 reps - Standing Tandem Balance with Counter Support  - 2 x daily - 7 x weekly - 3 sets - 30 hold - Standing Single Leg Stance with Counter Support  - 2 x daily - 7 x weekly - 3 sets - 30 hold  Access Code: [email or web address] URL: [email or web address] Date: 12/18/2023 - Scapular Retraction with Resistance  - 2 x daily - 7 x weekly - 2 sets - 10 reps - Standing Shoulder Extension with Resistance  - 2 x daily - 7 x weekly - 2 sets - 10 reps  ASSESSMENT:  CLINICAL IMPRESSION: Session focus with core stability exercises to assist with balance and posture.  Dynamic balance activities complete with SBA/min guard for safety, difficulty with tandem and SLS based activities.  Improved stability this session with lunges on BOSU with less UE support required.  Cueing required for posture to assist with balance.  Pt tolerated well to session with no reports of increased pain, was limited by fatigue at appropriate levels.  Patient is a 73 y.o. female who was seen today for physical therapy evaluation and treatment for  G62.9 (ICD-10-CM) - Polyneuropathy, unspecified. Patient arrives to PT with relevant history of Myastenia Gravis and new polyneuropathy/peripheral neuropathy diagnosis. Patient reports her biggest issue this date is balance and endurance. On this date, patient demonstrates decreased LE strength, and deficits with static balance.  Patient educated on taking anticipatory breaks when performing her usual daily tasks to aid in decreasing chances of falls due to fatigue. Patient will benefit from skilled physical therapy in order to address strength, balance, and endurance to improve function/QOL.    OBJECTIVE IMPAIRMENTS: decreased activity tolerance, decreased balance, decreased endurance, difficulty walking, and decreased strength.   ACTIVITY LIMITATIONS: lifting, standing, squatting, stairs, transfers, and reach over head  PARTICIPATION LIMITATIONS: meal prep, cleaning, laundry, community activity, and yard work  PERSONAL FACTORS: N/A are also affecting patient's functional outcome.   REHAB POTENTIAL: Good  CLINICAL DECISION MAKING: Stable/uncomplicated  EVALUATION COMPLEXITY: Low   GOALS: Goals reviewed with patient? No  SHORT TERM GOALS: Target date: 11/20/23 Patient will be independent with performance of HEP to demonstrate adequate self management of symptoms.  Baseline: 12/09/23:  Reports compliance with HEP on regular basis Goal status: MET  2.   Patient will report at least a 25% improvement with function or pain overall since beginning PT. Baseline: 12/09/23:  Feels improvements 10-15% Goal status: In progress  LONG TERM GOALS: Target date: 01/08/24  Patient will improve ABC Scale by at least 10.5 points in order to demonstrate improved self-perceived function while meeting MCID (based on MCID for MS patients) Baseline:  12/09/23: ABC score: 1350/1600= 84.375 (was 1300 / 1600 = 81.3 %) Goal status: In progress  2.  Patient will maintain single leg balance for 30 seconds  bilaterally in order to demonstrate safety with single leg balancing tasks such as stair navigation.  Baseline: 12/09/23:test next session,  7/31:  30 seconds each Goal status: MET  3.  Patient will report 0 near falls within a weeks time in order to demonstrate improved risk of falls.  Baseline: 12/09/23:  No reports of falls in week, reports occasional feet crossing. Goal status: Ongoing  4. Patient will report at least a 50% improvement with function or pain  overall since beginning PT. Baseline: 12/09/23:  Feels improvements 10-15% Goal status: Ongoing  PLAN:  PT FREQUENCY: 2x/week  PT DURATION: 6 weeks  PLANNED INTERVENTIONS: [id number]- PT Re-evaluation, 97110-Therapeutic exercises, 97530- Therapeutic activity, 97112- Neuromuscular re-education, 97535- Self Care, [id number]- Manual therapy, [id number]- Gait training, [phone]- Electrical stimulation (manual), [id number]- Traction (mechanical), [phone] (1-2 muscles), [id number] (3+ muscles)- Dry Needling, Patient/Family education, Balance training, Stair training, Taping, Joint mobilization, Spinal mobilization, Cryotherapy, and Moist heat.  PLAN FOR NEXT SESSION: Progress LE strength and balance. Reassess next session for probable DC to HEP.  Augustin Mclean, LPTA/CLT; [facility] [phone]   3:18 PM, 12/26/23

## 2023-12-31 DIAGNOSIS — R52 Pain, unspecified: Secondary | ICD-10-CM | POA: Diagnosis not present

## 2023-12-31 DIAGNOSIS — G43719 Chronic migraine without aura, intractable, without status migrainosus: Secondary | ICD-10-CM | POA: Diagnosis not present

## 2023-12-31 DIAGNOSIS — G7000 Myasthenia gravis without (acute) exacerbation: Secondary | ICD-10-CM | POA: Diagnosis not present

## 2023-12-31 DIAGNOSIS — G43109 Migraine with aura, not intractable, without status migrainosus: Secondary | ICD-10-CM | POA: Diagnosis not present

## 2023-12-31 DIAGNOSIS — H532 Diplopia: Secondary | ICD-10-CM | POA: Diagnosis not present

## 2024-01-01 ENCOUNTER — Ambulatory Visit (HOSPITAL_COMMUNITY)

## 2024-01-01 DIAGNOSIS — Z7409 Other reduced mobility: Secondary | ICD-10-CM | POA: Diagnosis not present

## 2024-01-01 DIAGNOSIS — R29898 Other symptoms and signs involving the musculoskeletal system: Secondary | ICD-10-CM | POA: Diagnosis not present

## 2024-01-01 DIAGNOSIS — R2689 Other abnormalities of gait and mobility: Secondary | ICD-10-CM

## 2024-01-01 NOTE — Therapy (Signed)
 OUTPATIENT PHYSICAL THERAPY THORACOLUMBAR TREATMENT Progress Note Reporting Period 11/06/22 to 01/01/24  See note below for Objective Data and Assessment of Progress/Goals.  PHYSICAL THERAPY DISCHARGE SUMMARY  Visits from Start of Care: 12  Current functional level related to goals / functional outcomes: See below   Remaining deficits: See below   Education / Equipment: HEP   Patient agrees to discharge. Patient goals were met. Patient is being discharged due to meeting the stated rehab goals.        Patient Name: Mary [name] MRN: [id number] DOB:1951-02-18, 73 y.o., female Today's Date: 01/01/2024  END OF SESSION:  PT End of Session - 01/01/24 0846     Visit Number 12    Number of Visits 12    Date for PT Re-Evaluation 12/30/23    Authorization Type [employer] medicare    Authorization Time Period cohere approved 12 visits from 11/06/2023-01/05/2024    Authorization - Visit Number 12    Authorization - Number of Visits 12    Progress Note Due on Visit 16    PT Start Time 0846    PT Stop Time 0926    PT Time Calculation (min) 40 min    Activity Tolerance Patient tolerated treatment well    Behavior During Therapy WFL for tasks assessed/performed            Past Medical History:  Diagnosis Date   Anemia    Arthritis    Chronic kidney disease    idiopathic micro hematuria    Complication of anesthesia    patient states does not take much medicine to put her to sleep    Edema    ankles to knees    GERD (gastroesophageal reflux disease)    Headache(784.0)    hx of migraines    Hyperlipidemia    Mitral valve prolapse    no symptoms    Myasthenia gravis (HCC)    PONV (postoperative nausea and vomiting)    Scalp alopecia    Seizures (HCC)    1 seizure 45 years ago; unknown etiology and no meds, no seizures since then.   Past Surgical History:  Procedure Laterality Date   BIOPSY  03/24/2020   Procedure: BIOPSY;  Surgeon: Cindie Carlin POUR, DO;   [facility]: [facility];  Service: Endoscopy;;  gastric duodenum   CERVICAL FUSION  2006   cervical 5-6    COLONOSCOPY N/A 03/03/2017   Procedure: COLONOSCOPY;  Surgeon: Harvey Margo CROME, MD;  [facility]: [facility];  Service: Endoscopy;  Laterality: N/A;  1:15 pm   COLONOSCOPY N/A 12/23/2023   Procedure: COLONOSCOPY;  Surgeon: Cindie Carlin POUR, DO;  [facility]: [facility];  Service: Endoscopy;  Laterality: N/A;  11:00 am, asa 2   COLONOSCOPY WITH PROPOFOL  N/A 03/24/2020   Procedure: COLONOSCOPY WITH PROPOFOL ;  Surgeon: Cindie Carlin POUR, DO;  [facility]: [facility];  Service: Endoscopy;  Laterality: N/A;  10:00am   COLONOSCOPY WITH PROPOFOL  N/A 10/02/2020   Procedure: COLONOSCOPY WITH PROPOFOL ;  Surgeon: Cindie Carlin POUR, DO;  [facility];  Service: Endoscopy;  Laterality: N/A;  ASA II / 9:00   ESOPHAGOGASTRODUODENOSCOPY (EGD) WITH PROPOFOL  N/A 03/24/2020   Procedure: ESOPHAGOGASTRODUODENOSCOPY (EGD) WITH PROPOFOL ;  Surgeon: Cindie Carlin POUR, DO;  [facility];  Service: Endoscopy;  Laterality: N/A;   HEMORRHOID SURGERY N/A 06/23/2017   Procedure: ANOSCOPY WITH REMOVAL OF SINGLE LESION;  Surgeon: Mavis Anes, MD;  Location: [facility];  Service: General;  Laterality: N/A;  MYOCARDIAL PERFUSION STUDY  07/02/2011   NO SCINTIGRAPHIC EVIDENCE OF INDUCIBLE MYOCARDIAL ISCHEMIA. POST-STRESS EF IS 87%.EXERCISE CAPACITY 9 METS. EKG SHOWS NSR AT 69. THERE IS HORIZONTAL ST DEPRESSION WITH EXERCISE IN II, III, AVF SEEN BEST AT EARLY RECOVERY. THIS IS PRESENT IN V3-V5 AS WELL AND RETURNS TO BASELINE AT THE END OF THE TEST. NORMAL STUDY.   POLYPECTOMY  03/24/2020   Procedure: POLYPECTOMY;  Surgeon: Cindie Carlin POUR, DO;  [facility];  Service: Endoscopy;;  colon    POLYPECTOMY  10/02/2020   Procedure: POLYPECTOMY;  Surgeon: Cindie Carlin POUR, DO;  [facility];  Service: Endoscopy;;   RENAL ARTERY DOPPLER  12/28/2003   CELIAC ARTERY: AT REST, 168.4 CM/S;  INSPRIATION, 177.6 CM/S. ARCUATE LIGAMENT COMPRESSION SYNDROME. R & L KIDNEYS: RIGHT KIDNEY MEASURES SOME WHAT SMALLER THAN LEFT. KIDNEY ARE ESSENTIALLY SYMMETRICAL IN SHAPE WITH NO OBVIOUS ABNORMALITY VISUALIZED. R & L RENAL ARTERIES: DEMONSTRATE NORMAL SPECTRA . NO SUGGESTION OF DIAMETER REDUCTION, DISSECTION, FIBROMUSCULAR DYSPLASIA OR ANY OTHER VASCULAR ABNOR   TOTAL KNEE ARTHROPLASTY Right 08/31/2013   Procedure: RIGHT TOTAL KNEE ARTHROPLASTY;  Surgeon: Donnice JONETTA Car, MD;  Location: [facility];  Service: Orthopedics;  Laterality: Right;   TOTAL KNEE ARTHROPLASTY Left 03/07/2015   Procedure: TOTAL KNEE ARTHROPLASTY;  Surgeon: Donnice Car, MD;  Location: [facility];  Service: Orthopedics;  Laterality: Left;   TRANSTHORACIC ECHOCARDIOGRAM  07/02/2011   LV SYSTOLIC FUNCTION NORMAL, EF=>55%, INSIGNIFICANT PERICARDIAL EFFUSION, LEFT ATRIAL SIZE IS NORMAL, RV SYSTOLIC PRESSURE IS NORMAL. NO SIGN VALVULAR HEART DISEASE.   TUBAL LIGATION  1986   veins stripping      bilateral legs    Patient Active Problem List   Diagnosis Date Noted   Chronic rhinitis 11/18/2023   Hypertrophy of nasal turbinates 11/18/2023   Postnasal drip 11/18/2023   Neck pain 03/13/2023   Gait abnormality 01/23/2023   Diplopia 01/23/2023   Gastritis 04/12/2020   Serrated polyp of colon 04/12/2020   Constipation 02/23/2020   Melena 02/23/2020   Esophageal reflux 02/23/2020   Condyloma acuminatum    Encounter for colonoscopy due to history of adenomatous colonic polyps    Overweight (BMI 25.0-29.9) 03/09/2015   S/P left TKA 03/07/2015   Palpitations 05/23/2014   Expected blood loss anemia 09/01/2013   S/P right TKA 08/31/2013   DJD (degenerative joint disease) of knee 08/31/2013   Pre-operative clearance, cardiac 08/17/2013   HTN (hypertension) 05/20/2013   Hyperlipidemia 05/20/2013   Migraine 02/23/2007   ALLERGIC RHINITIS 02/23/2007   TRACHEOBRONCHITIS 02/23/2007   COUGH 02/23/2007    PCP: Bertell Satterfield,  MD  REFERRING PROVIDER: Charmel Ivan Hamming, MD  REFERRING DIAG: G62.9 (ICD-10-CM) - Polyneuropathy, unspecified  Rationale for Evaluation and Treatment: Rehabilitation  THERAPY DIAG:  Impairment of balance  Leg weakness, bilateral  Impaired functional mobility, balance, gait, and endurance  ONSET DATE: 4-5 Months   SUBJECTIVE:  SUBJECTIVE STATEMENT: 100% better since therapy.  No new falls.  Says MD has rescinded her diagnosis of Myasthenia Gravis  EVAL: Patient reports she's noticed she has tinging in feet, toes, that travels up to knees at times. Reports it makes her feel wobbly at times. Reports she has tried massage to her feet and LE and reports it may be helping some but not much. Harder to maintain balance in dark when she gets up in the middle of the night.   PERTINENT HISTORY:  Myastenia Gravis 2 TKR  PAIN:  Are you having pain? Yes: NPRS scale: 2/10 leg, 3/10 headache (constant) Pain location: Bottom of R foot, headache/neck pain Pain description: Achy, N/T Aggravating factors: Walking  Relieving factors: Rest  PRECAUTIONS: None  RED FLAGS: Neuropathy    WEIGHT BEARING RESTRICTIONS: No  FALLS:  Has patient fallen in last 6 months? Yes. Number of falls 1. A lot of near falls. On uneven surfaces.   LIVING ENVIRONMENT: Lives with: lives alone Stairs: Yes: Internal: 12 steps; on right going up and External: 6 steps; can reach both Has following equipment at home: None Has RW and SPC  OCCUPATION: Retired, Pension scheme manager  PLOF: Independent  PATIENT GOALS: To increase stability and see if we can figure out how to get Neuropathy pain from getting worse  NEXT MD VISIT: October 17th, 2025  OBJECTIVE:  Note: Objective measures were completed at  Evaluation unless otherwise noted.  DIAGNOSTIC FINDINGS:    PATIENT SURVEYS:  Total ABC score: 1300 / 1600 = 81.3 % 12/09/23: ABC score: 1350/1600= 84.375  COGNITION: Overall cognitive status: Within functional limits for tasks assessed     SENSATION: WFL   POSTURE: rounded shoulders, forward head, and increased thoracic kyphosis  PALPATION:   LUMBAR ROM:   AROM eval  Flexion   Extension   Right lateral flexion   Left lateral flexion   Right rotation   Left rotation    (Blank rows = not tested)  LOWER EXTREMITY ROM:     Active  Right eval Left eval  Hip flexion    Hip extension    Hip abduction    Hip adduction    Hip internal rotation    Hip external rotation    Knee flexion    Knee extension    Ankle dorsiflexion    Ankle plantarflexion    Ankle inversion    Ankle eversion     (Blank rows = not tested)  LOWER EXTREMITY MMT:    MMT Right eval Left eval Right 12/09/23 Left  12/09/23  Hip flexion 4- 4- 4 4  Hip extension 3+ 3+ 4- 4+  Hip abduction 4- 4- 4- 4  Hip adduction      Hip internal rotation      Hip external rotation      Knee flexion 3+ 3+ 4- 4  Knee extension 4- 4- 4+ 5  Ankle dorsiflexion 5 5 5 5   Ankle plantarflexion      Ankle inversion      Ankle eversion       (Blank rows = not tested)  LUMBAR SPECIAL TESTS:    FUNCTIONAL TESTS:  4 Stage Balance: Side by side: 30 Semi-tandem: 30 ea LE leading Tandem:  30 ea LE leading  SLS: L- 22 R- 11  11/13/23: TUG: 7.58 seconds Functional Gait Assessment Summary 1. GAIT LEVEL SURFACE: Moderate impairment -- gait level surface (1)  (1 points) 2. CHANGE IN GAIT SPEED: Mild impairment -- change in  gait speed (2)  (2 points) 3. GAIT WITH HORIZONTAL HEAD TURNS: Mild impairment -- gait with horizontal head turns (2)  (2 points) 4. GAIT WITH VERTICAL HEAD TURNS: Moderate impairment -- gait with vertical head turns (1)  (1 points) 5. GAIT AND PIVOT TURN: Moderate impairment  -- gait and pivot turn (1)  (1 points) 6. STEP OVER OBSTACLE: Normal -- step over obstacle (3)  (3 points) 7. GAIT WITH NARROW BASE OF SUPPORT: Mild impairment -- gait with narrow base of support (2)  (2 points) 8. GAIT WITH EYES CLOSED: Mild impairment -- gait with eyes closed (2)  (2 points) 9. AMBULATING BACKWARDS: Mild impairment -- ambulating backwards (2)  (2 points) 10. STEPS: Normal -- up and down steps (3)  (3 points) Functional Gait Assessment: 19/30=63.3 percent.  12/11/23 amd 12/09/23 TUG: 7.1 seconds SLS:  Rt:  30, Lt 30 FGA: 24/30 433ft Reviewed goals, feels she has improved by 10-15% MMT see above ABC 1350/1600= 84.375 was 81.3  GAIT: Distance walked: 66' Assistive device utilized: None Level of assistance: Complete Independence Comments: Mildly unsteady at times  TREATMENT DATE:  01/01/24 Progress note ABC scale 94.4% TUG 7.09 sec no UE assist SLS x 30 each   12/26/23: UBE backwards x4' L2 Wall push-ups 10x 2 sets 5 holds GTB shoulder extension 2X10  2x10 feet on foam + marching Lunge onto BOSU with Bil UE overhead 10x 5 Kettlebell swing 10# 2x 10; cueing for hip hinge Balance beam with 12in hurdles 3RT  12/22/23: GTB shoulder extension 2X10  2x10 feet on foam + marching GTB rows 2X10, feet on foam + marching Paloff press + mini lunge, foot leg on BOSU dome, 20x each way Walking lunges, 20 ftx 2, holding 5 lb. Kettlebell in one hand, CGA/min A Deep squats, 20 ft x 1, holding 10 lb. Kettebell Traveling Single leg RDL to pick up cones, 20 ft x 2   12/18/2023  Treadmill 4 minutes, grade 2.  Speed 1.3-1.63mph  last 2 minutes Heel/toe walks, 4 laps on 20 foot line, pt cued for keeping heels/toes off ground Lateral stepping with tidal tank with squat, 20 foot line; 1 RT rest between RTB rows 2X10 RTB shoulder extension 2X10 Leg press 2X10, 4 plates   05/16/7972  Therapeutic Exercise: -Treadmill 4 minutes, grade 2, speed 1.3 and 1.5  for last 30 seconds -Leg press, 2 sets of 10 reps, plate #4, plate #5 seconds set, pt cued for avoidance of knee lock out and eccentric control -Sit to stands, 2 sets of 5 reps, throughout session   Neuromuscular Re-education: -Heel/toe walks, 4 laps on 20 foot line, pt cued for keeping heels/toes off ground, last two laps with tidal tank column -Lateral stepping with tidal tank, 20 foot bout, pt demonstrates one loss of balance with need for mod therapist assist to keep balance -Standing shoulder rows/extensions, RTB at chest level with narrow base of support, 2 sets of 10 reps, seconds set standing on blue foam -Sliding towel lunge, 2 sets of 4 reps bilaterally, in parallel bars pt cued for decreased UE support    PATIENT EDUCATION:  Education details: PT evaluation, objective findings, POC, Importance of HEP, Precautions, Clinic policies  Person educated: Patient Education method: Explanation and Demonstration Education comprehension: verbalized understanding and returned demonstration  HOME EXERCISE PROGRAM: Access Code: [email or web address] URL: [email or web address] Date: 11/06/2023 Prepared by: Rosaria Powell-Butler  Exercises - Sit to Stand  - 2 x daily - 7 x weekly - 3 sets - 10  reps - Standing Hip Extension with Counter Support  - 2 x daily - 7 x weekly - 3 sets - 10 reps - Side Stepping with Counter Support  - 2 x daily - 7 x weekly - 3 sets - 10 reps - Standing Tandem Balance with Counter Support  - 2 x daily - 7 x weekly - 3 sets - 30 hold - Standing Single Leg Stance with Counter Support  - 2 x daily - 7 x weekly - 3 sets - 30 hold  Access Code: [email or web address] URL: [email or web address] Date: 12/18/2023 - Scapular Retraction with Resistance  - 2 x daily - 7 x weekly - 2 sets - 10 reps - Standing Shoulder Extension with Resistance  - 2 x daily - 7 x weekly - 2 sets - 10 reps  ASSESSMENT:  CLINICAL IMPRESSION: Progress note; met remaining unmet goals today  and is agreeable to discharge  Patient is a 73 y.o. female who was seen today for physical therapy evaluation and treatment for G62.9 (ICD-10-CM) - Polyneuropathy, unspecified. Patient arrives to PT with relevant history of Myastenia Gravis and new polyneuropathy/peripheral neuropathy diagnosis. Patient reports her biggest issue this date is balance and endurance. On this date, patient demonstrates decreased LE strength, and deficits with static balance.  Patient educated on taking anticipatory breaks when performing her usual daily tasks to aid in decreasing chances of falls due to fatigue. Patient will benefit from skilled physical therapy in order to address strength, balance, and endurance to improve function/QOL.    OBJECTIVE IMPAIRMENTS: decreased activity tolerance, decreased balance, decreased endurance, difficulty walking, and decreased strength.   ACTIVITY LIMITATIONS: lifting, standing, squatting, stairs, transfers, and reach over head  PARTICIPATION LIMITATIONS: meal prep, cleaning, laundry, community activity, and yard work  PERSONAL FACTORS: N/A are also affecting patient's functional outcome.   REHAB POTENTIAL: Good  CLINICAL DECISION MAKING: Stable/uncomplicated  EVALUATION COMPLEXITY: Low   GOALS: Goals reviewed with patient? No  SHORT TERM GOALS: Target date: 11/20/23 Patient will be independent with performance of HEP to demonstrate adequate self management of symptoms.  Baseline: 12/09/23:  Reports compliance with HEP on regular basis Goal status: MET  2.   Patient will report at least a 25% improvement with function or pain overall since beginning PT. Baseline: 12/09/23:  Feels improvements 10-15% Goal status: met  LONG TERM GOALS: Target date: 01/08/24  Patient will improve ABC Scale by at least 10.5 points in order to demonstrate improved self-perceived function while meeting MCID (based on MCID for MS patients) Baseline:  12/09/23: ABC score: 1350/1600= 84.375  (was 1300 / 1600 = 81.3 %); 01/01/24 94.4% Goal status: met  2.  Patient will maintain single leg balance for 30 seconds bilaterally in order to demonstrate safety with single leg balancing tasks such as stair navigation.  Baseline: 12/09/23:test next session,  7/31:  30 seconds each Goal status: MET  3.  Patient will report 0 near falls within a weeks time in order to demonstrate improved risk of falls.  Baseline: 12/09/23:  No reports of falls in week, reports occasional feet crossing. Goal status: met  4. Patient will report at least a 50% improvement with function or pain overall since beginning PT. Baseline: 12/09/23:  Feels improvements 10-15% Goal status: met  PLAN:  PT FREQUENCY: 2x/week  PT DURATION: 6 weeks  PLANNED INTERVENTIONS: 97164- PT Re-evaluation, 97110-Therapeutic exercises, 97530- Therapeutic activity, [id number]- Neuromuscular re-education, 97535- Self Care, [id number]- Manual therapy, [id number]- Gait training, [id number]- Electrical stimulation (manual), [id number]- Traction (mechanical), [phone] (  1-2 muscles), 20561 (3+ muscles)- Dry Needling, Patient/Family education, Balance training, Stair training, Taping, Joint mobilization, Spinal mobilization, Cryotherapy, and Moist heat.  PLAN FOR NEXT SESSION: discharge 9:14 AM, 01/01/24 Marvin Maenza Small Jermarion Poffenberger MPT [facility] [place] [phone]

## 2024-01-15 DIAGNOSIS — Z01419 Encounter for gynecological examination (general) (routine) without abnormal findings: Secondary | ICD-10-CM | POA: Diagnosis not present

## 2024-01-15 DIAGNOSIS — N898 Other specified noninflammatory disorders of vagina: Secondary | ICD-10-CM | POA: Diagnosis not present

## 2024-01-15 DIAGNOSIS — Z6824 Body mass index (BMI) 24.0-24.9, adult: Secondary | ICD-10-CM | POA: Diagnosis not present

## 2024-01-16 DIAGNOSIS — H532 Diplopia: Secondary | ICD-10-CM | POA: Diagnosis not present

## 2024-01-16 DIAGNOSIS — H43813 Vitreous degeneration, bilateral: Secondary | ICD-10-CM | POA: Diagnosis not present

## 2024-01-16 DIAGNOSIS — H2513 Age-related nuclear cataract, bilateral: Secondary | ICD-10-CM | POA: Diagnosis not present

## 2024-01-27 DIAGNOSIS — Z01818 Encounter for other preprocedural examination: Secondary | ICD-10-CM | POA: Diagnosis not present

## 2024-01-27 DIAGNOSIS — K219 Gastro-esophageal reflux disease without esophagitis: Secondary | ICD-10-CM | POA: Diagnosis not present

## 2024-01-27 DIAGNOSIS — I10 Essential (primary) hypertension: Secondary | ICD-10-CM | POA: Diagnosis not present

## 2024-01-27 DIAGNOSIS — N1832 Chronic kidney disease, stage 3b: Secondary | ICD-10-CM | POA: Diagnosis not present

## 2024-02-04 DIAGNOSIS — R52 Pain, unspecified: Secondary | ICD-10-CM | POA: Diagnosis not present

## 2024-02-04 DIAGNOSIS — Z299 Encounter for prophylactic measures, unspecified: Secondary | ICD-10-CM | POA: Diagnosis not present

## 2024-02-04 DIAGNOSIS — E7800 Pure hypercholesterolemia, unspecified: Secondary | ICD-10-CM | POA: Diagnosis not present

## 2024-02-04 DIAGNOSIS — G43909 Migraine, unspecified, not intractable, without status migrainosus: Secondary | ICD-10-CM | POA: Diagnosis not present

## 2024-02-04 DIAGNOSIS — K5900 Constipation, unspecified: Secondary | ICD-10-CM | POA: Diagnosis not present

## 2024-02-04 DIAGNOSIS — R0602 Shortness of breath: Secondary | ICD-10-CM | POA: Diagnosis not present

## 2024-03-18 ENCOUNTER — Encounter: Payer: Self-pay | Admitting: Internal Medicine

## 2024-03-18 ENCOUNTER — Ambulatory Visit: Admitting: Internal Medicine

## 2024-03-18 VITALS — BP 137/75 | HR 65 | Ht 64.0 in | Wt 145.6 lb

## 2024-03-18 DIAGNOSIS — R0609 Other forms of dyspnea: Secondary | ICD-10-CM | POA: Diagnosis not present

## 2024-03-18 DIAGNOSIS — R053 Chronic cough: Secondary | ICD-10-CM | POA: Diagnosis not present

## 2024-03-18 DIAGNOSIS — Z87891 Personal history of nicotine dependence: Secondary | ICD-10-CM | POA: Diagnosis not present

## 2024-03-18 MED ORDER — FAMOTIDINE 20 MG PO TABS: ORAL_TABLET | ORAL | 11 refills | Status: AC

## 2024-03-18 MED ORDER — PANTOPRAZOLE SODIUM 40 MG PO TBEC: 40.0000 mg | DELAYED_RELEASE_TABLET | Freq: Every day | ORAL | 2 refills | Status: DC

## 2024-03-18 NOTE — Assessment & Plan Note (Addendum)
 Stopped smoking 1990 with onset of doe around 2000 in setting of rhinitis and cough   - CT chest with contrast  09/05/23  wnl  03/18/2024   Walked on RA  x  3  lap(s) =  approx 450  ft  @ fast pace, stopped due to end of study with lowest 02 sats 95% s sob or cough   - DOE labs  03/18/2024    When respiratory symptoms begin or become refractory well after a patient reports complete smoking cessation, especially when this wasn't the case while they were smoking, a red flag is raised based on the work of Dr Genette which states:  if you quit smoking when your best day FEV1 is still well preserved it is highly unlikely you will progress to severe disease.  That is to say, once the smoking stops,  the symptoms should not suddenly erupt or markedly worsen.  If so, the differential diagnosis should include  obesity/deconditioning,  LPR/Reflux/Aspiration syndromes,  occult CHF, or  especially side effect of medications commonly used in this population.    Rec: check doe labs/ pfts to be complete, but I don't think we'll find this is not    a pulmonary issue.

## 2024-03-18 NOTE — Patient Instructions (Addendum)
 Stop prilosec for now   Pantoprazole (protonix) 40 mg   Take  30-60 min before first meal of the day and Pepcid (famotidine)  20 mg after supper until return to office - this is the best way to tell whether stomach acid is contributing to your problem.    GERD (REFLUX)  is an extremely common cause of respiratory symptoms just like yours , many times with no obvious heartburn at all.    It can be treated with medication, but also with lifestyle changes including elevation of the head of your bed (ideally with 6 -8inch blocks under the headboard of your bed),  Smoking cessation, avoidance of late meals, excessive alcohol, and avoid fatty foods, chocolate, peppermint, colas, red wine, and acidic juices such as orange juice.  NO MINT OR MENTHOL  PRODUCTS SO NO COUGH DROPS  USE SUGARLESS CANDY INSTEAD (Jolley ranchers or Stover's or Life Savers) or even ice chips will also do - the key is to swallow to prevent all throat clearing. NO OIL BASED VITAMINS - use powdered substitutes.  Avoid fish oil when coughing.   Please remember to go to the lab department   for your tests - we will call you with the results when they are available.     Please schedule a follow up office visit in 6 weeks, call sooner if needed PFT's on return

## 2024-03-18 NOTE — Assessment & Plan Note (Addendum)
 Quit smokng 1990 s sob or cough then onset of cough around 89  - Cx fusion 2006  - Rx by Dr Neysa 2008 only help was love GLENWO[facility] Moccasin for vasomotor rhinitis rx atrovent NS improves the rhinitis but not the cough  -  Allergy screen 03/18/2024 >  Eos 0. /  IgE    Almost certainly this is not a pulmonary problem but most c/w Upper airway cough syndrome (previously labeled PNDS),  is so named because it's frequently impossible to sort out how much is  CR/sinusitis with freq throat clearing (which can be related to primary GERD)   vs  causing  secondary ( extra esophageal)  GERD from wide swings in gastric pressure that occur with throat clearing, often  promoting self use of mint and menthol  lozenges that reduce the lower esophageal sphincter tone and exacerbate the problem further in a cyclical fashion.   These are the same pts (now being labeled as having irritable larynx syndrome by some cough centers) who not infrequently have a history of having failed to tolerate ace inhibitors,  dry powder inhalers or biphosphonates or report having atypical/extraesophageal reflux symptoms from LPR (globus, throat clearing)  that don't respond to standard doses of PPI  and are easily confused as having aecopd or asthma flares by even experienced allergists/ pulmonologists (myself included).   Rec:  >>>  max gerd rx   >>> ENT re eval, this time focusing on her globus/hoarsness  rather than her rhinitis    >>> consider refer to DR Brien at [facility] vs gabapentin trial first   Discussed in detail all the  indications, usual  risks and alternatives  relative to the benefits with patient who agrees to proceed with initial  Rx as outlined.             Each maintenance medication was reviewed in detail including emphasizing most importantly the difference between maintenance and prns and under what circumstances the prns are to be triggered using an action plan format where appropriate.  Total time for H and P,  chart review, counseling,   and generating customized AVS unique to this office visit / same day charting = 60 min new pt eval  for multiple longstanding   refractory respiratory  symptoms of uncertain etiology

## 2024-03-18 NOTE — Progress Notes (Signed)
 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 then around 2000  chronic cough eval by [facility] sensitive to dogs/ trees/ grass  never on shots  referred to pulmonary clinic in [facility]  03/18/2024 by Mary Mines NP  for cough    Cx fusion 2006 (ant approach but already had cough then)   Saw Dr Mary [name] 2008 never took shots / allegra worked the best for drainage/ cough  -  too woozy on zyrtec   CT chest 09/05/23  wnl   Mary [name] eval yearly for rhinitis rec ipatropium helps the nose running but not the cough, says has not mentioned the cough, clearing of throat/globus  to him before    History of Present Illness  03/18/2024  Pulmonary/ 1st office eval/ Mary [name] / [name]  Chief Complaint  Patient presents with   Establish Care    Shob (states need pft)  Dyspnea: walking up to 3/4 mile flat / slow pace sometimes stopping due to legs tired, sometimes sob  Cough: not  really present on wakening/ worse as day goes on  Sleep: head of bed is on bricks/ 1 pillow s resp cc but if gets up to pee starts  coughing more mucus lately x clear mucus only  SABA use: none   02: none     No obvious other  day to day or daytime pattern/variability or assoc purulent sputum or mucus plugs or hemoptysis or cp or chest tightness, subjective wheeze or overt  hb symptoms.   Also denies any obvious fluctuation of symptoms with weather or environmental changes or other aggravating or alleviating factors except as outlined above   No unusual exposure hx or h/o childhood pna/ asthma or knowledge of premature birth.  Current Allergies, Complete Past Medical History, Past Surgical History, Family History, and Social History were reviewed in [employer] record.  ROS  The following are not active complaints unless bolded Hoarseness, sore throat(globus) , dysphagia, dental problems,  itching, sneezing,  nasal congestion or discharge of excess mucus or purulent secretions, ear ache,   fever, chills, sweats, unintended wt loss or wt gain, classically pleuritic or exertional cp,  orthopnea pnd or arm/hand swelling  or leg swelling, presyncope, palpitations, abdominal pain, anorexia, nausea, vomiting, diarrhea  or change in bowel habits or change in bladder habits, change in stools or change in urine, dysuria, hematuria,  rash, arthralgias, visual complaints, headache, numbness, weakness or ataxia or problems with walking or coordination,  change in mood or  memory.            Outpatient Medications Prior to Visit  Medication Sig Dispense Refill   docusate sodium  (COLACE) 100 MG capsule Take 200 mg by mouth daily.     EMGALITY 120 MG/ML SOAJ Inject 120 mg into the skin every 30 (thirty) days.      ezetimibe  (ZETIA ) 10 MG tablet TAKE 1 TABLET BY MOUTH ONCE A DAY. 90 tablet 3   Ferrous Sulfate  (IRON) 325 (65 Fe) MG TABS Take 325 mg by mouth at bedtime.     fexofenadine (ALLEGRA) 180 MG tablet Take 180 mg by mouth daily.     ipratropium (ATROVENT) 0.06 % nasal spray Place 2 sprays into both nostrils daily.     linaclotide  (LINZESS ) 145 MCG CAPS capsule Take 1 capsule (145 mcg total) by mouth daily before breakfast. 90  capsule 1   Magnesium  Gluconate 250 MG TABS Take 250 mg by mouth daily at 12 noon.      Multiple Vitamin (MULTIVITAMIN WITH MINERALS) TABS tablet Take 1 tablet by mouth daily. Centrum Silver     MYRBETRIQ 50 MG TB24 tablet Take 50 mg by mouth daily.     OVER THE COUNTER MEDICATION Take 4 capsules by mouth in the morning and at bedtime. kidney stuff Natural     Polyethyl Glycol-Propyl Glycol (SYSTANE OP) Place 1 drop into both eyes 2 (two) times daily.     pyridOXINE (VITAMIN B-6) 100 MG tablet Take 100 mg by mouth daily.     rosuvastatin  (CRESTOR ) 40 MG tablet Take 1 tablet (40 mg total) by mouth at bedtime. 90 tablet 3   SUMAtriptan  (IMITREX ) 100 MG tablet Take 100  mg by mouth as needed for migraine. May repeat in 2 hours if headache persists or recurs.     SUMAtriptan  Succinate Refill 4 MG/0.5ML SOCT Inject 4 mg into the skin as needed (for migraines).     omeprazole  (PRILOSEC) 40 MG capsule Take 1 capsule (40 mg total) by mouth daily. 90 capsule 3   No facility-administered medications prior to visit.    Past Medical History:  Diagnosis Date   Anemia    Arthritis    Chronic kidney disease    idiopathic micro hematuria    Complication of anesthesia    patient states does not take much medicine to put her to sleep    Edema    ankles to knees    GERD (gastroesophageal reflux disease)    Headache(784.0)    hx of migraines    Hyperlipidemia    Mitral valve prolapse    no symptoms    Myasthenia gravis (HCC)    PONV (postoperative nausea and vomiting)    Scalp alopecia    Seizures (HCC)    1 seizure 45 years ago; unknown etiology and no meds, no seizures since then.      Objective:     BP 137/75   Pulse 65   Ht 5' 4 (1.626 m)   Wt 145 lb 9.6 oz (66 kg)   SpO2 98% Comment: ra  BMI 24.99 kg/m   SpO2: 98 % (ra)   amb somber wf with  aspy voice texture and classic voice fatigue      HEENT : Oropharynx  pristine      Nasal turbinates mod non specific edema   NECK :  without  apparent JVD/ palpable Nodes/TM    LUNGS: no acc muscle use,  Nl contour chest which is clear to A and P bilaterally without cough on insp or exp maneuvers   CV:  RRR  no s3 or murmur or increase in P2, and no edema   ABD:  soft and nontender   MS:  Gait nl   ext warm without deformities Or obvious joint restrictions  calf tenderness, cyanosis or clubbing    SKIN: warm and dry without lesions    NEURO:  alert, approp, nl sensorium with  no motor or cerebellar deficits apparent.      Assessment   Assessment & Plan DOE (dyspnea on exertion) Stopped smoking 1990 with onset of doe around 2000 in setting of rhinitis and cough   - CT chest with  contrast  09/05/23  wnl  03/18/2024   Walked on RA  x  3  lap(s) =  approx 450  ft  @ fast pace, stopped due  to end of study with lowest 02 sats 95% s sob or cough   - DOE labs  03/18/2024    When respiratory symptoms begin or become refractory well after a patient reports complete smoking cessation, especially when this wasn't the case while they were smoking, a red flag is raised based on the work of Dr Genette which states:  if you quit smoking when your best day FEV1 is still well preserved it is highly unlikely you will progress to severe disease.  That is to say, once the smoking stops,  the symptoms should not suddenly erupt or markedly worsen.  If so, the differential diagnosis should include  obesity/deconditioning,  LPR/Reflux/Aspiration syndromes,  occult CHF, or  especially side effect of medications commonly used in this population.    Rec: check doe labs/ pfts to be complete, but I don't think we'll find this is not    a pulmonary issue.    Chronic cough Quit smokng 1990 s sob or cough then onset of cough around 200  - Cx fusion 2006  - Rx by Dr Mary [name] 2008 only help was love GLENWOOD Moccasin for vasomotor rhinitis rx atrovent NS improves the rhinitis but not the cough  -  Allergy screen 03/18/2024 >  Eos 0. /  IgE    Almost certainly this is not a pulmonary problem but most c/w Upper airway cough syndrome (previously labeled PNDS),  is so named because it's frequently impossible to sort out how much is  CR/sinusitis with freq throat clearing (which can be related to primary GERD)   vs  causing  secondary ( extra esophageal)  GERD from wide swings in gastric pressure that occur with throat clearing, often  promoting self use of mint and menthol  lozenges that reduce the lower esophageal sphincter tone and exacerbate the problem further in a cyclical fashion.   These are the same pts (now being labeled as having irritable larynx syndrome by some cough centers) who not infrequently have a  history of having failed to tolerate ace inhibitors,  dry powder inhalers or biphosphonates or report having atypical/extraesophageal reflux symptoms from LPR (globus, throat clearing)  that don't respond to standard doses of PPI  and are easily confused as having aecopd or asthma flares by even experienced allergists/ pulmonologists (myself included).   Rec:  >>>  max gerd rx   >>> ENT re eval, this time focusing on her globus/hoarsness  rather than her rhinitis    >>> consider refer to DR Brien at [facility] [place] vs gabapentin trial first   Discussed in detail all the  indications, usual  risks and alternatives  relative to the benefits with patient who agrees to proceed with initial  Rx as outlined.             Each maintenance medication was reviewed in detail including emphasizing most importantly the difference between maintenance and prns and under what circumstances the prns are to be triggered using an action plan format where appropriate.  Total time for H and P, chart review, counseling,   and generating customized AVS unique to this office visit / same day charting = 60 min new pt eval  for multiple longstanding   refractory respiratory  symptoms of uncertain etiology          AVS  Patient Instructions  Stop prilosec for now   Pantoprazole (protonix) 40 mg   Take  30-60 min before first meal of the day and Pepcid (famotidine)  20 mg after  supper until return to office - this is the best way to tell whether stomach acid is contributing to your problem.    GERD (REFLUX)  is an extremely common cause of respiratory symptoms just like yours , many times with no obvious heartburn at all.    It can be treated with medication, but also with lifestyle changes including elevation of the head of your bed (ideally with 6 -8inch blocks under the headboard of your bed),  Smoking cessation, avoidance of late meals, excessive alcohol, and avoid fatty foods, chocolate, peppermint, colas, red wine,  and acidic juices such as orange juice.  NO MINT OR MENTHOL  PRODUCTS SO NO COUGH DROPS  USE SUGARLESS CANDY INSTEAD (Jolley ranchers or Stover's or Life Savers) or even ice chips will also do - the key is to swallow to prevent all throat clearing. NO OIL BASED VITAMINS - use powdered substitutes.  Avoid fish oil when coughing.   Please remember to go to the lab department   for your tests - we will call you with the results when they are available.     Please schedule a follow up office visit in 6 weeks, call sooner if needed PFT's on return    Ozell America, MD 03/18/2024

## 2024-03-22 ENCOUNTER — Ambulatory Visit: Payer: Self-pay | Admitting: Internal Medicine

## 2024-03-22 LAB — CBC WITH DIFFERENTIAL/PLATELET
Basophils Absolute: 0 x10E3/uL (ref 0.0–0.2)
Basos: 1 %
EOS (ABSOLUTE): 0.1 x10E3/uL (ref 0.0–0.4)
Eos: 3 %
Hematocrit: 38.7 % (ref 34.0–46.6)
Hemoglobin: 12 g/dL (ref 11.1–15.9)
Immature Grans (Abs): 0 x10E3/uL (ref 0.0–0.1)
Immature Granulocytes: 0 %
Lymphocytes Absolute: 1.3 x10E3/uL (ref 0.7–3.1)
Lymphs: 26 %
MCH: 29.2 pg (ref 26.6–33.0)
MCHC: 31 g/dL — ABNORMAL LOW (ref 31.5–35.7)
MCV: 94 fL (ref 79–97)
Monocytes Absolute: 0.3 x10E3/uL (ref 0.1–0.9)
Monocytes: 7 %
Neutrophils Absolute: 3.2 x10E3/uL (ref 1.4–7.0)
Neutrophils: 63 %
Platelets: 220 x10E3/uL (ref 150–450)
RBC: 4.11 x10E6/uL (ref 3.77–5.28)
RDW: 11.9 % (ref 11.7–15.4)
WBC: 5 x10E3/uL (ref 3.4–10.8)

## 2024-03-22 LAB — ALPHA-1-ANTITRYPSIN PHENOTYP: A-1 Antitrypsin: 151 mg/dL (ref 101–187)

## 2024-03-22 LAB — BRAIN NATRIURETIC PEPTIDE: BNP: 28.7 pg/mL (ref 0.0–100.0)

## 2024-03-22 LAB — IGE: IgE (Immunoglobulin E), Serum: 13 [IU]/mL (ref 6–495)

## 2024-04-06 ENCOUNTER — Other Ambulatory Visit (HOSPITAL_COMMUNITY): Payer: Self-pay | Admitting: Obstetrics and Gynecology

## 2024-04-06 DIAGNOSIS — Z1231 Encounter for screening mammogram for malignant neoplasm of breast: Secondary | ICD-10-CM

## 2024-04-29 ENCOUNTER — Telehealth (INDEPENDENT_AMBULATORY_CARE_PROVIDER_SITE_OTHER): Payer: Self-pay | Admitting: Otolaryngology

## 2024-04-29 ENCOUNTER — Encounter: Payer: Self-pay | Admitting: Internal Medicine

## 2024-04-29 ENCOUNTER — Ambulatory Visit: Admitting: Internal Medicine

## 2024-04-29 ENCOUNTER — Ambulatory Visit (INDEPENDENT_AMBULATORY_CARE_PROVIDER_SITE_OTHER)

## 2024-04-29 VITALS — BP 112/68 | HR 66 | Ht 64.0 in | Wt 143.0 lb

## 2024-04-29 DIAGNOSIS — R053 Chronic cough: Secondary | ICD-10-CM | POA: Diagnosis not present

## 2024-04-29 DIAGNOSIS — Z87891 Personal history of nicotine dependence: Secondary | ICD-10-CM

## 2024-04-29 DIAGNOSIS — R0609 Other forms of dyspnea: Secondary | ICD-10-CM | POA: Diagnosis not present

## 2024-04-29 LAB — PULMONARY FUNCTION TEST
DL/VA % pred: 98 %
DL/VA: 4.04 ml/min/mmHg/L
DLCO unc % pred: 87 %
DLCO unc: 17.06 ml/min/mmHg
FEF 25-75 Post: 2.11 L/s
FEF 25-75 Pre: 2.16 L/s
FEF2575-%Change-Post: -2 %
FEF2575-%Pred-Post: 124 %
FEF2575-%Pred-Pre: 128 %
FEV1-%Change-Post: 1 %
FEV1-%Pred-Post: 100 %
FEV1-%Pred-Pre: 98 %
FEV1-Post: 2.16 L
FEV1-Pre: 2.12 L
FEV1FVC-%Change-Post: 2 %
FEV1FVC-%Pred-Pre: 107 %
FEV6-%Change-Post: -1 %
FEV6-%Pred-Post: 95 %
FEV6-%Pred-Pre: 96 %
FEV6-Post: 2.6 L
FEV6-Pre: 2.63 L
FEV6FVC-%Pred-Post: 105 %
FEV6FVC-%Pred-Pre: 105 %
FVC-%Change-Post: -1 %
FVC-%Pred-Post: 90 %
FVC-%Pred-Pre: 91 %
FVC-Post: 2.6 L
FVC-Pre: 2.63 L
Post FEV1/FVC ratio: 83 %
Post FEV6/FVC ratio: 100 %
Pre FEV1/FVC ratio: 81 %
Pre FEV6/FVC Ratio: 100 %
RV % pred: 134 %
RV: 3.1 L
TLC % pred: 110 %
TLC: 5.67 L

## 2024-04-29 NOTE — Progress Notes (Signed)
 Full PFT performed today.

## 2024-04-29 NOTE — Patient Instructions (Signed)
 Talk with Dr Karis about your sensation of too much throat drainage   Pulmoary follow up is as needed

## 2024-04-29 NOTE — Progress Notes (Unsigned)
 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  chronic cough eval by [facility] sensitive to dogs/ trees/ grass  never on shots  referred to pulmonary clinic in [facility]  03/18/2024 by Waddell Mines NP  for cough    Cx fusion 2006 (ant approach but already had cough then)   Saw Dr Neysa 2008 never took shots / allegra worked the best for drainage/ cough  -  too woozy on zyrtec   CT chest 09/05/23  wnl   Teoh eval yearly for rhinitis rec ipatropium helps the nose running but not the cough, says has not mentioned the cough, clearing of throat/globus  to him before    History of Present Illness  03/18/2024  Pulmonary/ 1st office eval/ [name] / [facility]  Chief Complaint  Patient presents with   Establish Care    Shob (states need pft)  Dyspnea: walking up to 3/4 mile flat / slow pace sometimes stopping due to legs tired, sometimes sob  Cough: not  really present on wakening/ worse as day goes on  Sleep: head of bed is on bricks/ 1 pillow s resp cc but if gets up to pee starts  coughing more mucus lately x clear mucus only  SABA use: none   02: none  Patient Instructions  Stop prilosec for now  Pantoprazole  (protonix ) 40 mg   Take  30-60 min before first meal of the day and Pepcid  (famotidine )  20 mg after supper until return to office   Please schedule a follow up office visit in 6 weeks, call sooner if needed PFT's on return    Allergy screen 03/18/2024 >  Eos 0.1 /  IgE  13 and doe labs wnl   PFTs 04/29/2024  nl x for low ERV at wt 140   04/29/2024  f/u ov/[place] office/Royanne Warshaw re: DOE  maint on gerd rx / [employer]or, [employer] Complaint  Patient presents with   Shortness of Breath    Pft @ 11 Doing better / coughing (clear -yellow)  Dyspnea:  water  aerobics / walking flat 20-25 min - today I below avg performance  Cough: overall some better on gerd   Sleeping: bed blocks s    resp cc  SABA use: none  02: non   No obvious day to day or daytime variability or assoc excess/ purulent sputum or mucus plugs or hemoptysis or cp or chest tightness, subjective wheeze or overt sinus or hb symptoms.    Also denies any obvious fluctuation of symptoms with weather or environmental changes or other aggravating or alleviating factors except as outlined above   No unusual exposure hx or h/o childhood pna/ asthma or knowledge of premature birth.  Current Allergies, Complete Past Medical History, Past Surgical History, Family History, and Social History were reviewed in [employer] record.  ROS  The following are not active complaints unless bolded Hoarseness, sore throat, dysphagia, dental problems, itching, sneezing,  nasal congestion or discharge of excess mucus or purulent secretions, ear ache,   fever, chills, sweats, unintended wt loss or wt gain, classically pleuritic or exertional cp,  orthopnea pnd or arm/hand swelling  or leg swelling, presyncope, palpitations, abdominal pain, anorexia, nausea, vomiting, diarrhea  or change in bowel habits or change in bladder habits, change in stools or change in urine, dysuria, hematuria,  rash, arthralgias, visual complaints, headache, numbness, weakness or ataxia or problems with walking or coordination,  change in mood or  memory.         Outpatient Medications Prior to Visit  Medication Sig Dispense Refill   docusate sodium  (COLACE) 100 MG capsule Take 200 mg by mouth daily.     EMGALITY 120 MG/ML SOAJ Inject 120 mg into the skin every 30 (thirty) days.      ezetimibe  (ZETIA ) 10 MG tablet TAKE 1 TABLET BY MOUTH ONCE A DAY. 90 tablet 3   famotidine  (PEPCID ) 20 MG tablet One after supper 30 tablet 11   Ferrous Sulfate  (IRON) 325 (65 Fe) MG TABS Take 325 mg by mouth at bedtime.     fexofenadine (ALLEGRA) 180 MG tablet Take 180 mg by mouth daily.     ipratropium (ATROVENT) 0.06 % nasal  spray Place 2 sprays into both nostrils daily.     linaclotide  (LINZESS ) 145 MCG CAPS capsule Take 1 capsule (145 mcg total) by mouth daily before breakfast. 90 capsule 1   Magnesium  Gluconate 250 MG TABS Take 250 mg by mouth daily at 12 noon.      Multiple Vitamin (MULTIVITAMIN WITH MINERALS) TABS tablet Take 1 tablet by mouth daily. Centrum Silver     MYRBETRIQ 50 MG TB24 tablet Take 50 mg by mouth daily.     OVER THE COUNTER MEDICATION Take 4 capsules by mouth in the morning and at bedtime. kidney stuff Natural     pantoprazole  (PROTONIX ) 40 MG tablet Take 1 tablet (40 mg total) by mouth daily. Take 30-60 min before first meal of the day 30 tablet 2   Polyethyl Glycol-Propyl Glycol (SYSTANE OP) Place 1 drop into both eyes 2 (two) times daily.     pyridOXINE (VITAMIN B-6) 100 MG tablet Take 100 mg by mouth daily.     rosuvastatin  (CRESTOR ) 40 MG tablet Take 1 tablet (40 mg total) by mouth at bedtime. 90 tablet 3   SUMAtriptan  (IMITREX ) 100 MG tablet Take 100 mg by mouth as needed for migraine. May repeat in 2 hours if headache persists or recurs.     SUMAtriptan  Succinate Refill 4 MG/0.5ML SOCT Inject 4 mg into the skin as needed (for migraines).     No facility-administered medications prior to visit.        Past Medical History:  Diagnosis Date   Anemia    Arthritis    Chronic kidney disease    idiopathic micro hematuria    Complication of anesthesia    patient states does not take much medicine to put her to sleep    Edema    ankles to knees    GERD (gastroesophageal reflux disease)    Headache(784.0)    hx of migraines    Hyperlipidemia    Mitral valve prolapse    no symptoms    Myasthenia gravis (HCC)    PONV (postoperative nausea and vomiting)    Scalp alopecia    Seizures (HCC)    1 seizure 45 years ago; unknown etiology and no meds, no seizures since then.      Objective:     Wt Readings from Last 3 Encounters:  04/29/24 143 lb (64.9 kg)  03/18/24 145 lb 9.6  oz (66 kg)  12/23/23 141 lb (64 kg)      Vital signs reviewed  04/29/2024  - Note at rest 02 sats  99% on RA   General appearance:    somber amb wf nad  HEENT : Oropharynx  clear      Nasal turbinates mild non-specific edema   NECK :  without  apparent JVD/ palpable Nodes/TM    LUNGS: no acc muscle use,  Nl contour chest which is clear to A and P bilaterally without cough on insp or exp maneuvers   CV:  RRR  no s3 or murmur or increase in P2, and no edema   ABD:  soft and nontender   MS:  Gait nl   ext warm without deformities Or obvious joint restrictions  calf tenderness, cyanosis or clubbing    SKIN: warm and dry without lesions    NEURO:  alert, approp, nl sensorium with  no motor or cerebellar deficits apparent.      Assessment

## 2024-04-29 NOTE — Patient Instructions (Signed)
 Full PFT performed today.

## 2024-04-29 NOTE — Telephone Encounter (Signed)
 Patient called for Atrovent 0.06 %  nasal spray refill. I called in 3 refills at [facility], also made her an appointment for 06/09/2024.

## 2024-05-01 NOTE — Assessment & Plan Note (Addendum)
 Stopped smoking 1990/MM with onset of doe around 2000 in setting of rhinitis and cough  - CT chest with contrast  09/05/23  wnl  03/18/2024   Walked on RA  x  3  lap(s) =  approx 450  ft  @ fast pace, stopped due to end of study with lowest 02 sats 95% s sob or cough   - DOE labs  03/18/2024 wnl - alpha one AT phenotype  MM/ level 151  - PFTs 04/29/2024  nl x for low ERV at wt 140   No pulmonary explanation for DOE and overall improved so >>> continue regular sub max ex and f/u here prn

## 2024-05-01 NOTE — Assessment & Plan Note (Addendum)
 Quit smokng 1990 s sob or cough then onset of cough around 200  - Cx fusion 2006  - Rx by Dr Neysa 2008 only help was love GLENWOOD Moccasin for vasomotor rhinitis rx atrovent NS improves the rhinitis but not the cough  -  Allergy screen 03/18/2024 >  Eos 0.1 /  IgE  13  C/w Upper airway cough syndrome (previously labeled PNDS),  is so named because it's frequently impossible to sort out how much is  CR/sinusitis with freq throat clearing (which can be related to primary GERD)   vs  causing  secondary ( extra esophageal)  GERD from wide swings in gastric pressure that occur with throat clearing, often  promoting self use of mint and menthol  lozenges that reduce the lower esophageal sphincter tone and exacerbate the problem further in a cyclical fashion.   These are the same pts (now being labeled as having irritable larynx syndrome by some cough centers) who not infrequently have a history of having failed to tolerate ace inhibitors(as is the case here) ,  dry powder inhalers or biphosphonates or report having atypical/extraesophageal reflux symptoms from LPR (globus, throat clearing)  that don't respond to standard doses of PPI  and are easily confused as having aecopd or asthma flares by even experienced allergists/ pulmonologists (myself included).   >>> refer back to Dr Moccasin re Globus sensation/ continue max gerd rx and allegra in meantime         Each maintenance medication was reviewed in detail including emphasizing most importantly the difference between maintenance and prns and under what circumstances the prns are to be triggered using an action plan format where appropriate.  Total time for H and P, chart review, counseling,  and generating customized AVS unique to this office visit / same day charting = 25 min final summary f/u ov

## 2024-05-19 ENCOUNTER — Other Ambulatory Visit: Payer: Self-pay | Admitting: Medical Genetics

## 2024-05-21 ENCOUNTER — Other Ambulatory Visit (HOSPITAL_COMMUNITY)
Admission: RE | Admit: 2024-05-21 | Discharge: 2024-05-21 | Disposition: A | Source: Ambulatory Visit | Attending: Medical Genetics | Admitting: Medical Genetics

## 2024-05-21 ENCOUNTER — Ambulatory Visit (HOSPITAL_COMMUNITY)
Admission: RE | Admit: 2024-05-21 | Discharge: 2024-05-21 | Disposition: A | Source: Ambulatory Visit | Attending: Obstetrics and Gynecology | Admitting: Obstetrics and Gynecology

## 2024-05-21 DIAGNOSIS — Z1231 Encounter for screening mammogram for malignant neoplasm of breast: Secondary | ICD-10-CM | POA: Insufficient documentation

## 2024-05-28 LAB — GENECONNECT MOLECULAR SCREEN: Genetic Analysis Overall Interpretation: NEGATIVE

## 2024-06-01 ENCOUNTER — Encounter: Payer: Self-pay | Admitting: Internal Medicine

## 2024-06-01 NOTE — Telephone Encounter (Signed)
 Error

## 2024-06-08 ENCOUNTER — Telehealth (INDEPENDENT_AMBULATORY_CARE_PROVIDER_SITE_OTHER): Payer: Self-pay | Admitting: Otolaryngology

## 2024-06-08 NOTE — Telephone Encounter (Signed)
 I called in Atrovent 0.06% nasal spray at [facility] with 3 total refill. She will follow up on 06/30/24 with Dr. Karis.

## 2024-06-08 NOTE — Telephone Encounter (Signed)
 Patient called 06/08/2024 at 10:24 am she is wanting a refill on her nasal spray solution and she was transferred up front to  reschedule her appointment due to excessive coughing.

## 2024-06-09 ENCOUNTER — Ambulatory Visit (INDEPENDENT_AMBULATORY_CARE_PROVIDER_SITE_OTHER): Admitting: Otolaryngology

## 2024-06-11 ENCOUNTER — Other Ambulatory Visit: Payer: Self-pay | Admitting: Internal Medicine

## 2024-06-11 DIAGNOSIS — R053 Chronic cough: Secondary | ICD-10-CM

## 2024-06-12 ENCOUNTER — Other Ambulatory Visit: Payer: Self-pay | Admitting: Internal Medicine

## 2024-06-12 DIAGNOSIS — K5904 Chronic idiopathic constipation: Secondary | ICD-10-CM

## 2024-06-30 ENCOUNTER — Ambulatory Visit (INDEPENDENT_AMBULATORY_CARE_PROVIDER_SITE_OTHER): Admitting: Otolaryngology
